# Patient Record
Sex: Female | Born: 1975 | Race: White | Hispanic: No | Smoking: Former smoker
Health system: Southern US, Community
[De-identification: ages and names within clinical notes are randomized; demographics above are authoritative.]

## PROBLEM LIST (undated history)

## (undated) DIAGNOSIS — M1990 Unspecified osteoarthritis, unspecified site: Secondary | ICD-10-CM

## (undated) DIAGNOSIS — F419 Anxiety disorder, unspecified: Secondary | ICD-10-CM

## (undated) DIAGNOSIS — F209 Schizophrenia, unspecified: Secondary | ICD-10-CM

## (undated) DIAGNOSIS — F319 Bipolar disorder, unspecified: Secondary | ICD-10-CM

## (undated) DIAGNOSIS — F1910 Other psychoactive substance abuse, uncomplicated: Secondary | ICD-10-CM

## (undated) DIAGNOSIS — K219 Gastro-esophageal reflux disease without esophagitis: Secondary | ICD-10-CM

## (undated) DIAGNOSIS — R51 Headache: Secondary | ICD-10-CM

## (undated) DIAGNOSIS — E611 Iron deficiency: Secondary | ICD-10-CM

## (undated) DIAGNOSIS — G4730 Sleep apnea, unspecified: Secondary | ICD-10-CM

## (undated) DIAGNOSIS — J342 Deviated nasal septum: Secondary | ICD-10-CM

## (undated) DIAGNOSIS — K279 Peptic ulcer, site unspecified, unspecified as acute or chronic, without hemorrhage or perforation: Secondary | ICD-10-CM

## (undated) DIAGNOSIS — E119 Type 2 diabetes mellitus without complications: Secondary | ICD-10-CM

## (undated) DIAGNOSIS — R7303 Prediabetes: Secondary | ICD-10-CM

## (undated) DIAGNOSIS — Z79899 Other long term (current) drug therapy: Secondary | ICD-10-CM

## (undated) DIAGNOSIS — D649 Anemia, unspecified: Secondary | ICD-10-CM

## (undated) HISTORY — DX: Other long term (current) drug therapy: Z79.899

## (undated) HISTORY — DX: Bipolar disorder, unspecified: F31.9

## (undated) HISTORY — DX: Deviated nasal septum: J34.2

## (undated) HISTORY — DX: Sleep apnea, unspecified: G47.30

## (undated) HISTORY — DX: Peptic ulcer, site unspecified, unspecified as acute or chronic, without hemorrhage or perforation: K27.9

## (undated) HISTORY — DX: Other psychoactive substance abuse, uncomplicated: F19.10

## (undated) HISTORY — DX: Gastro-esophageal reflux disease without esophagitis: K21.9

---

## 1998-09-14 ENCOUNTER — Other Ambulatory Visit: Admission: RE | Admit: 1998-09-14 | Discharge: 1998-09-14 | Payer: Self-pay | Admitting: Family Medicine

## 2001-10-06 HISTORY — PX: SEPTOPLASTY: SUR1290

## 2007-11-18 ENCOUNTER — Ambulatory Visit: Payer: Self-pay | Admitting: Internal Medicine

## 2007-11-18 IMAGING — CT CT HEAD WITHOUT CONTRAST
2 series · 16 of 30 positions shown, 20 images · non-contrast
Comparison: none

REASON FOR EXAM: headache dizziness x 2 days memory loss  Call report
after 5pm [phone]
COMMENTS:

[Series 2: without · axial · non-contrast · 0.39mm/px · z∈[+870,+990]mm · 13 of 29 slices shown, 17 images]
[im 3/29  brain]
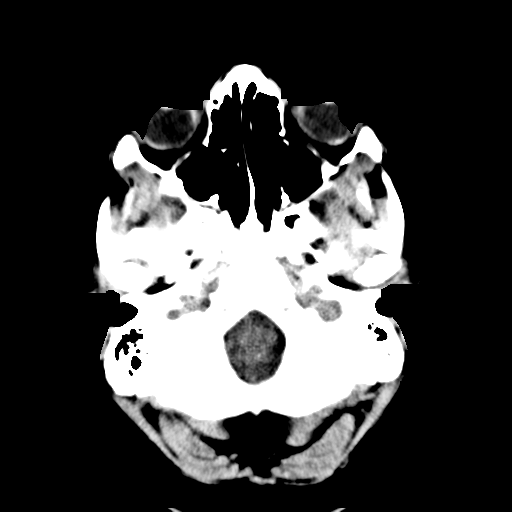
[im 3/29  bone]
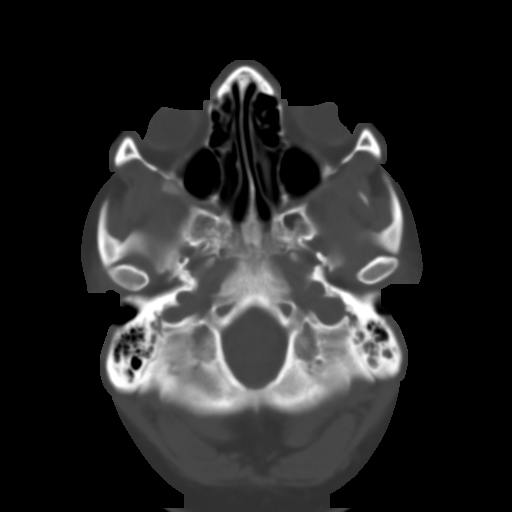
[im 5/29  brain]
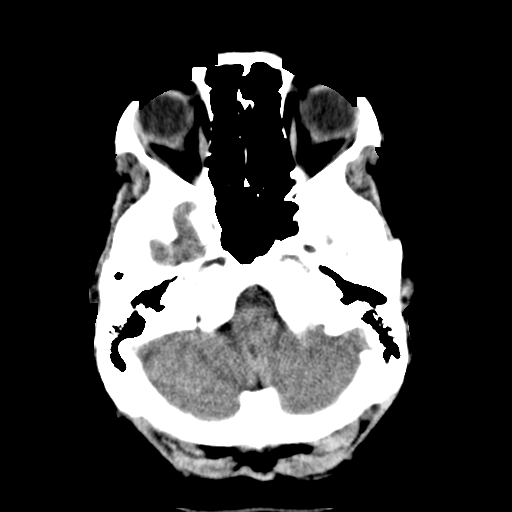
[im 7/29  brain]
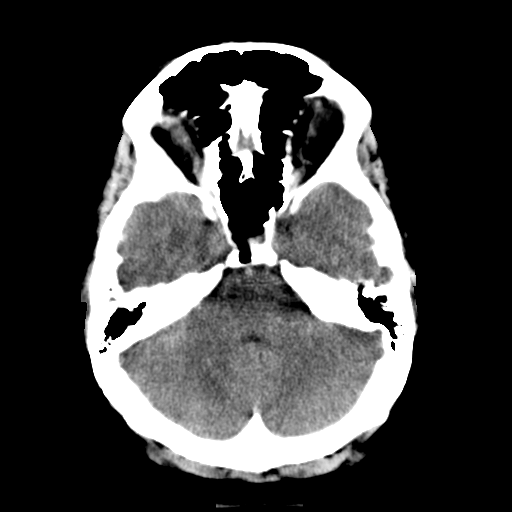
[im 9/29  brain]
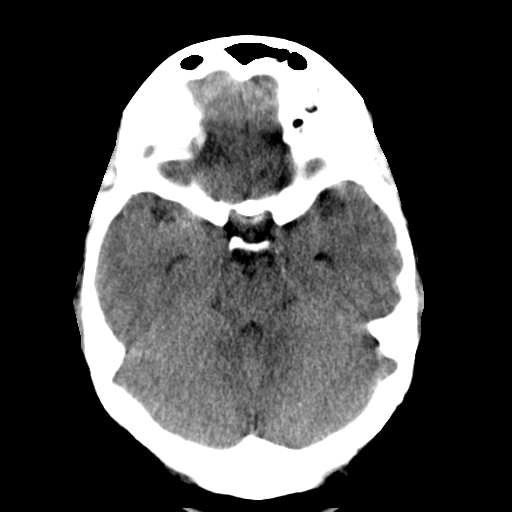
[im 11/29  brain]
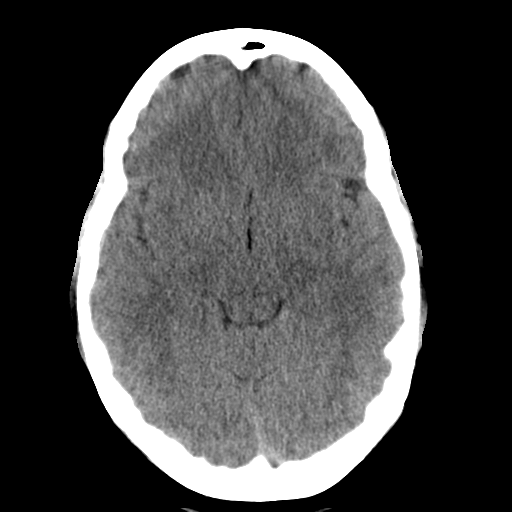
[im 11/29  bone]
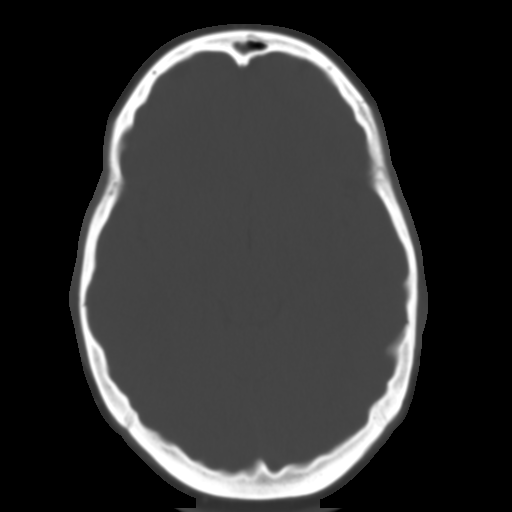
[im 13/29  brain]
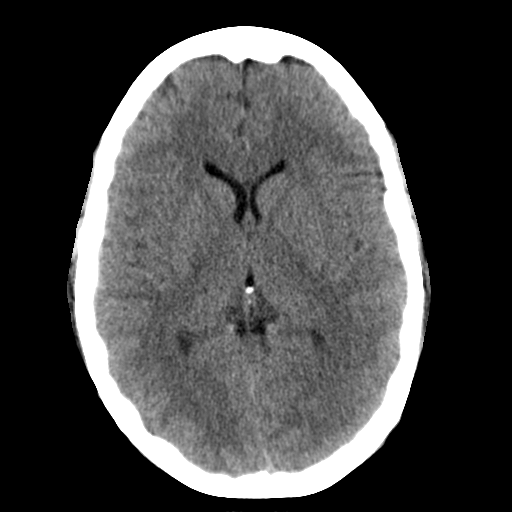
[im 15/29  brain]
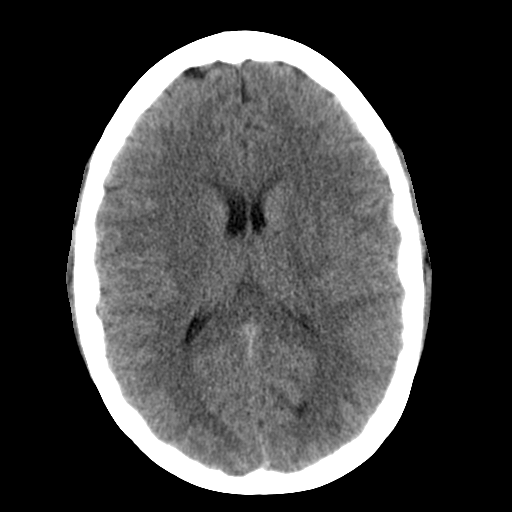
[im 17/29  brain]
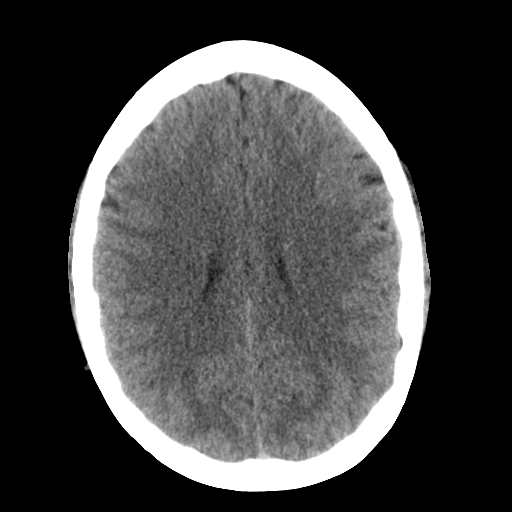
[im 19/29  brain]
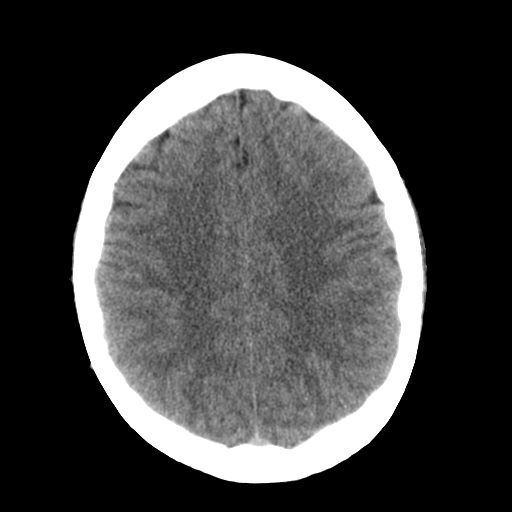
[im 19/29  bone]
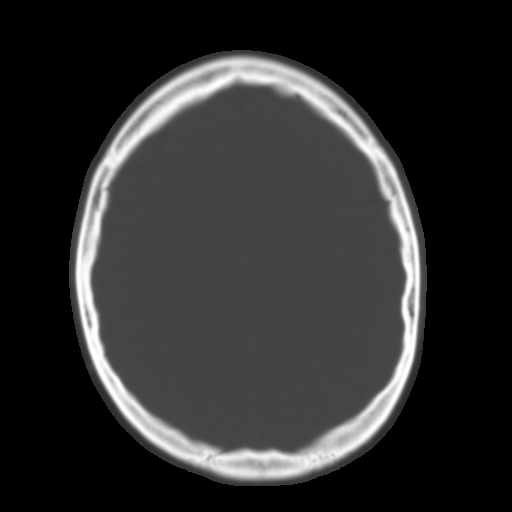
[im 21/29  brain]
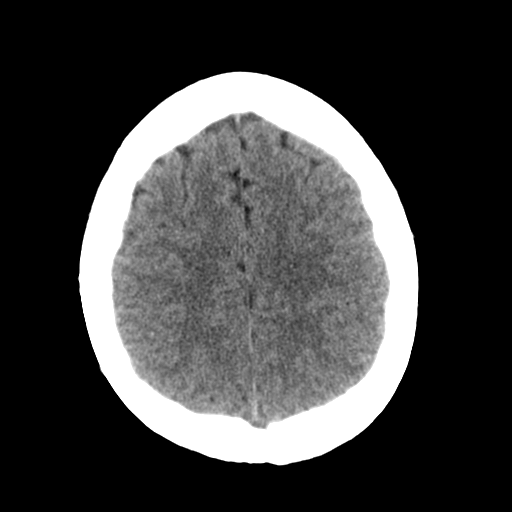
[im 23/29  brain]
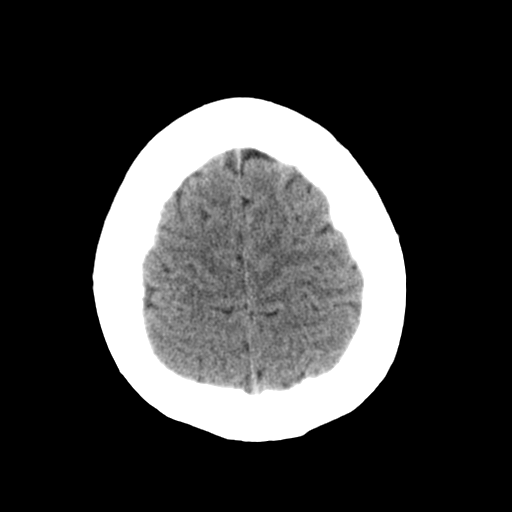
[im 25/29  brain]
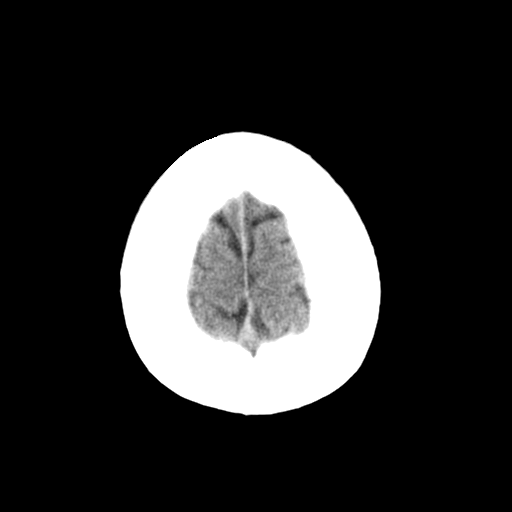
[im 27/29  brain]
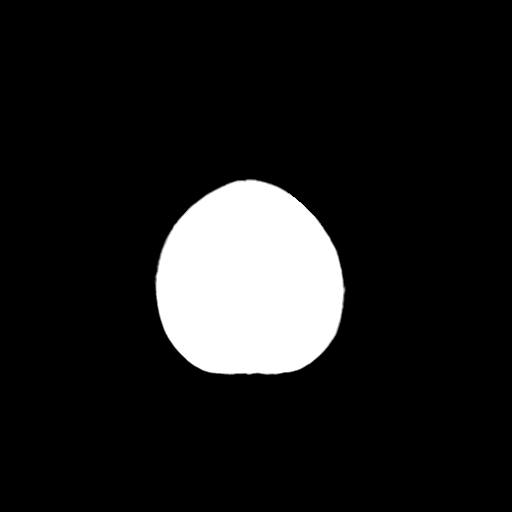
[im 27/29  bone]
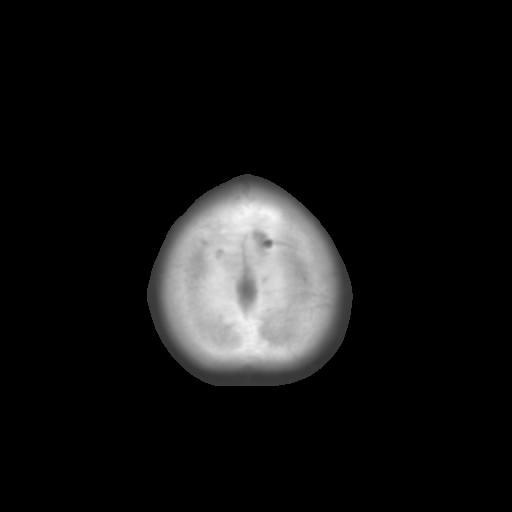

[Series 3: bone · axial · 0.39mm/px · z∈[+870,+910]mm · 3 of 29 slices shown]
[im 3/29  bone]
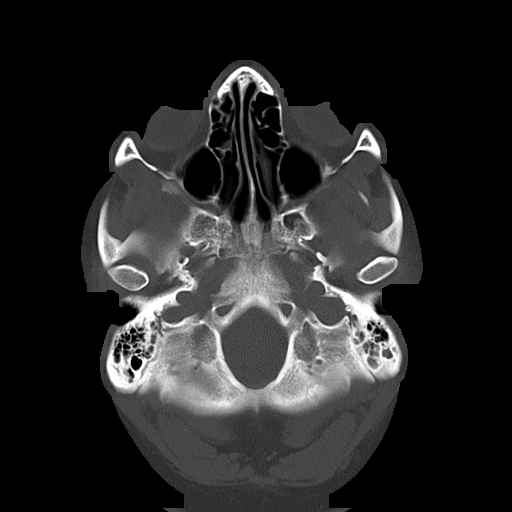
[im 7/29  bone]
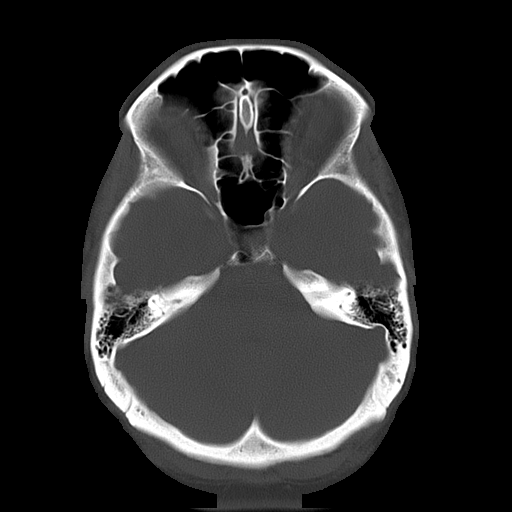
[im 11/29  bone]
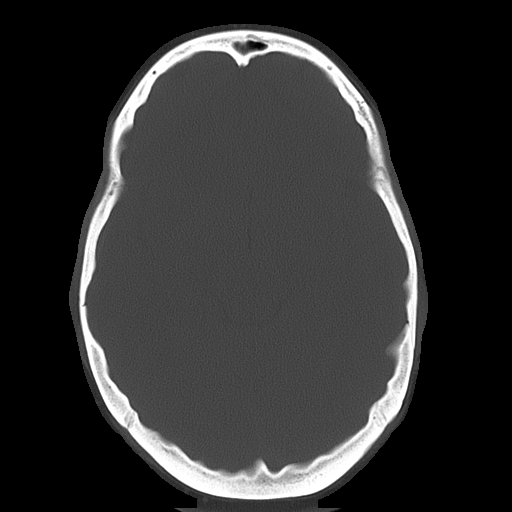

[16 of 30 positions shown; findings below may reference images not displayed]

PROCEDURE:     CT  - CT HEAD WITHOUT CONTRAST  - November 18, 2007  [DATE]

RESULT:     There is no evidence of intra-axial or extra-axial fluid
collections.  There is no evidence of acute hemorrhage or secondary signs
reflecting mass effect, subacute or chronic infarction.  The visualized bony
skeleton is evaluated and there is no evidence of depressed skull fracture
or further fracture or dislocation.  The visualized mastoid air cells are
clear.  The ventricles, cisterns and sulci are symmetric and patent.
IMPRESSION: 1)No evidence of acute intracranial abnormalities as described above.

2)If there is persistent clinical concern, further evaluation with Brain MRI
is recommended if clinically warranted.

## 2009-04-12 ENCOUNTER — Ambulatory Visit: Payer: Self-pay | Admitting: Internal Medicine

## 2009-08-08 ENCOUNTER — Emergency Department: Payer: Self-pay | Admitting: Emergency Medicine

## 2009-08-08 IMAGING — US ABDOMEN ULTRASOUND
1 series · 17 of 25 positions shown · non-contrast
Comparison: none

REASON FOR EXAM: RUQ/epigastric pain with vomiting
COMMENTS:

PROCEDURE:     US  - US ABDOMEN GENERAL SURVEY  - August 08, 2009 [DATE]
RESULT:     Comparison: None
TECHNIQUE: Multiple gray-scale and color-flow Doppler images of the abdomen
are presented for review.

[Series 1: abdomen ultrasound · 17 of 49 slices shown]
[im 1/49]
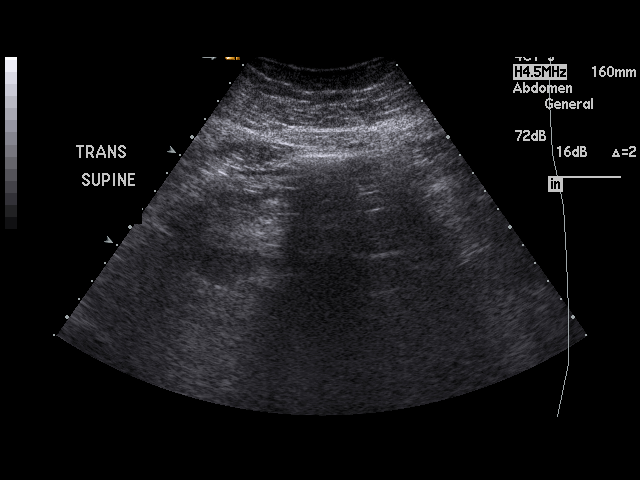
[im 5/49]
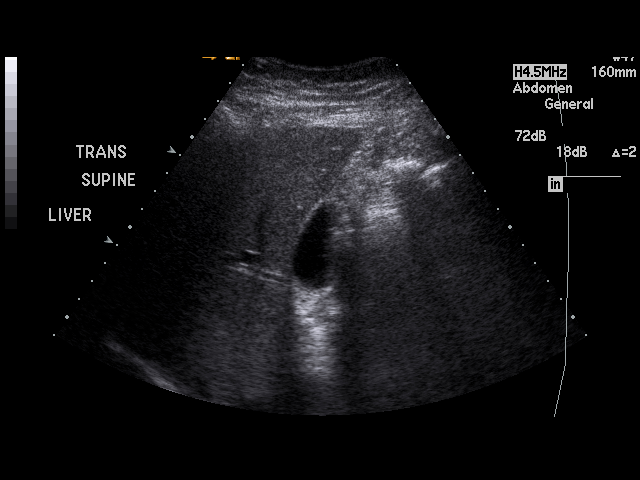
[im 7/49]
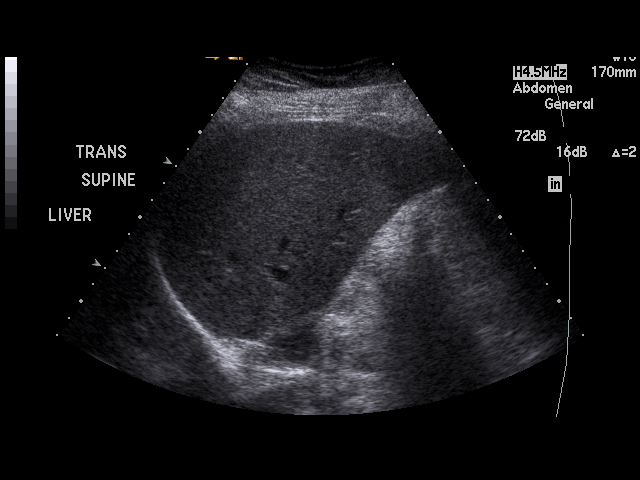
[im 11/49]
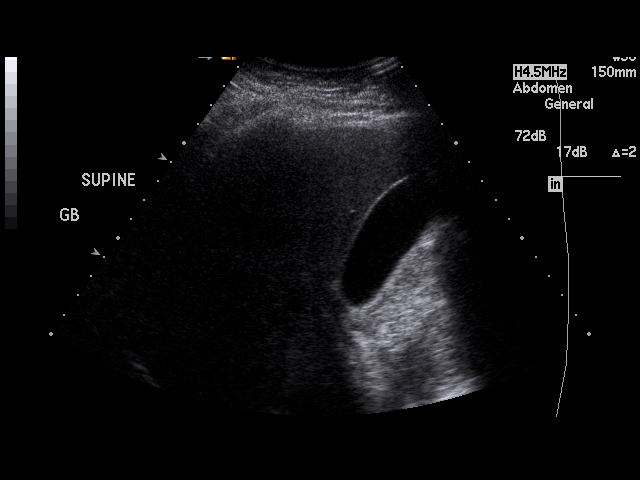
[im 13/49]
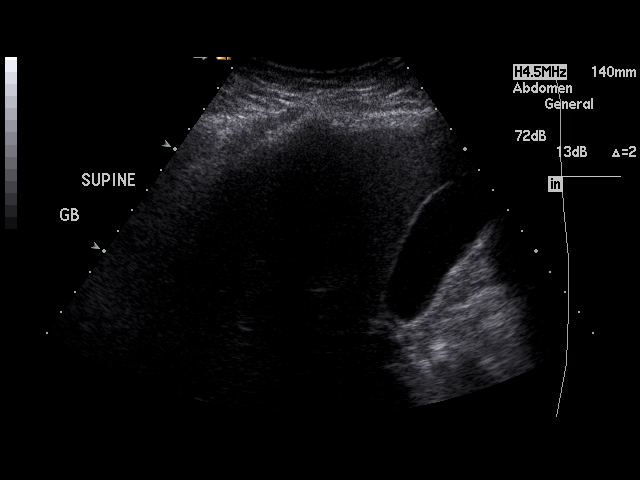
[im 17/49]
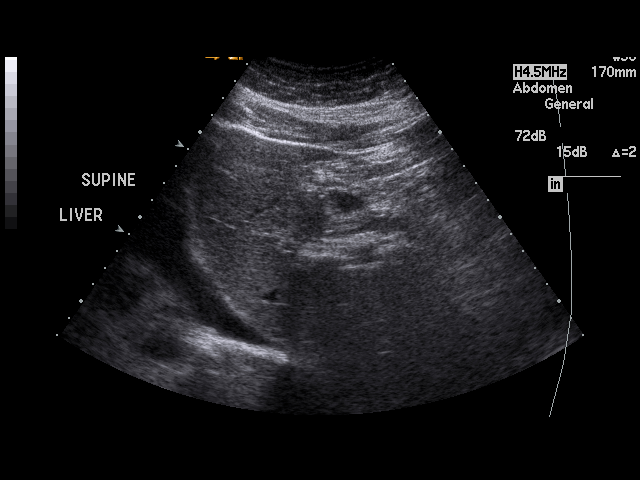
[im 19/49]
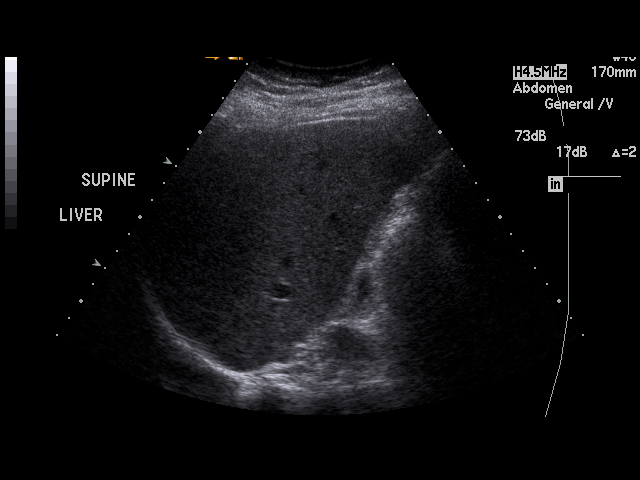
[im 23/49]
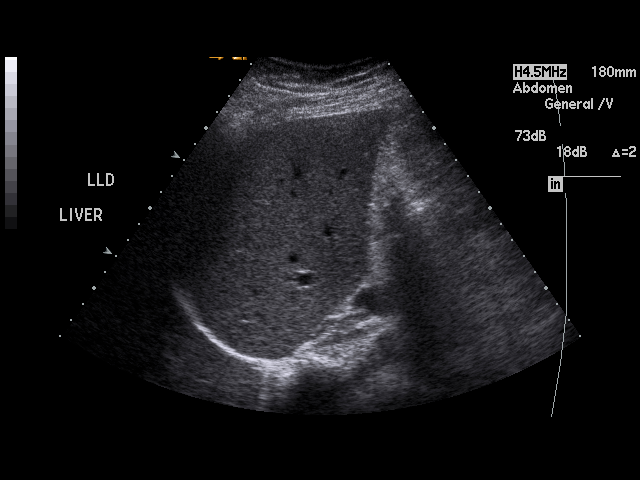
[im 25/49]
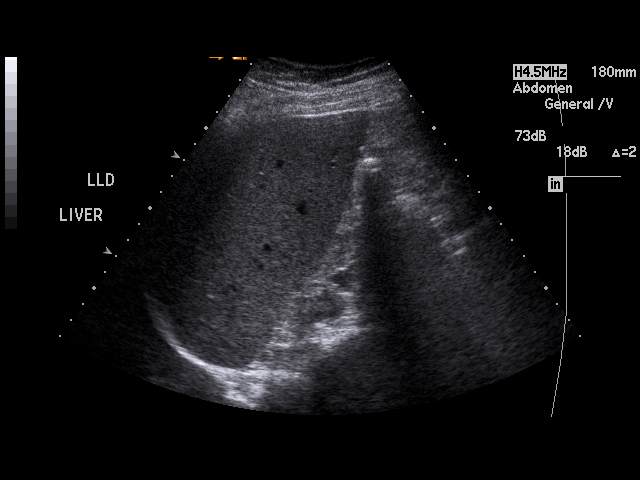
[im 27/49]
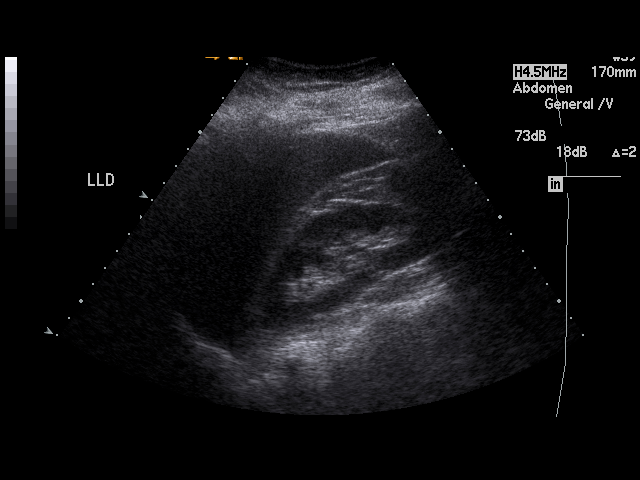
[im 31/49]
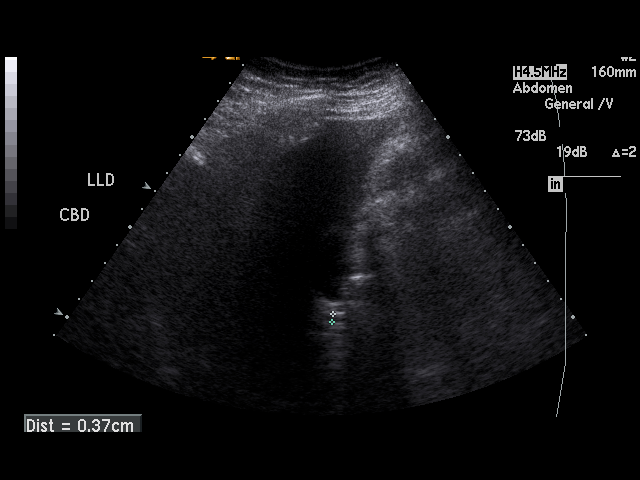
[im 33/49]
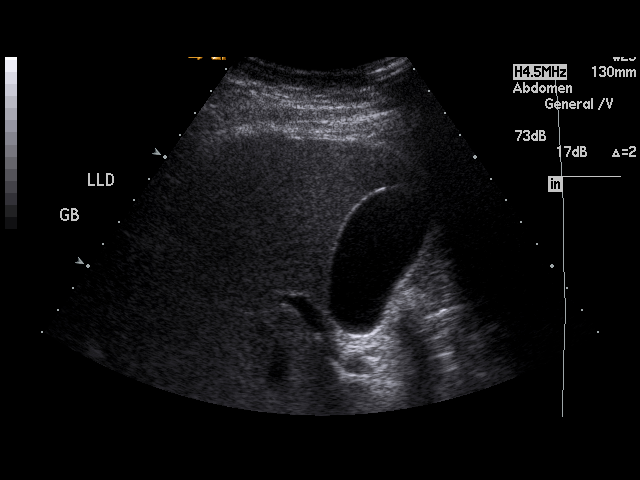
[im 37/49]
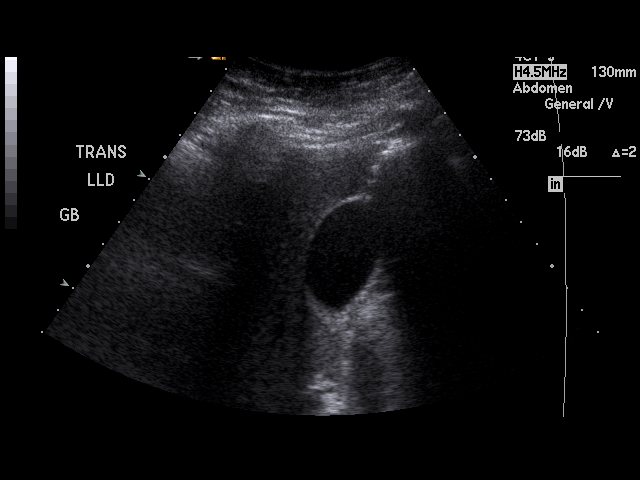
[im 39/49]
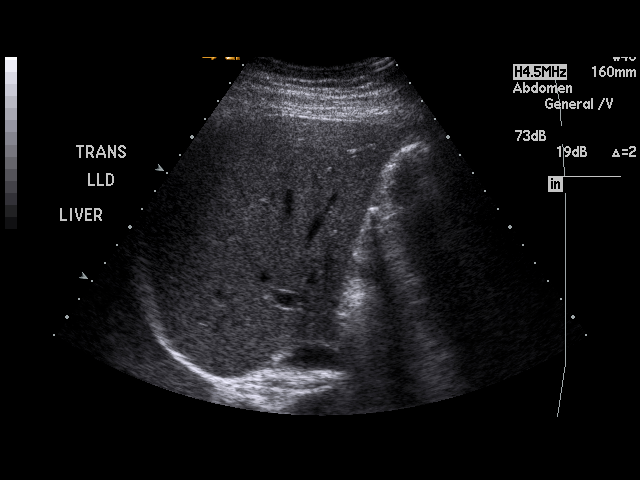
[im 43/49]
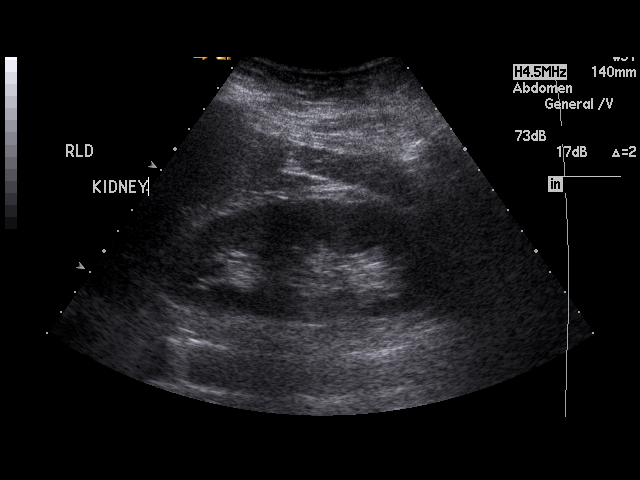
[im 45/49]
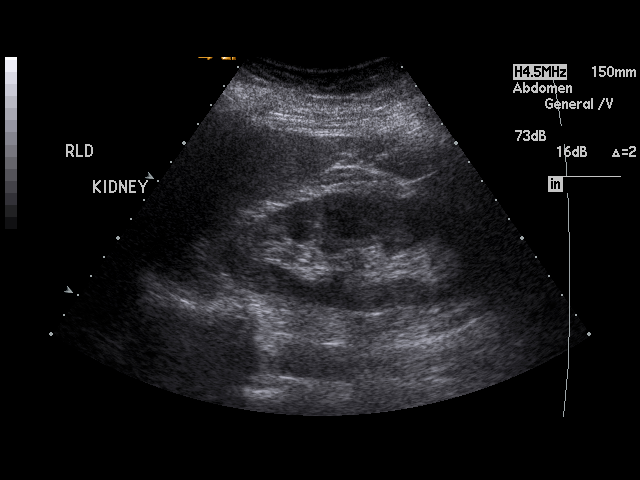
[im 49/49]
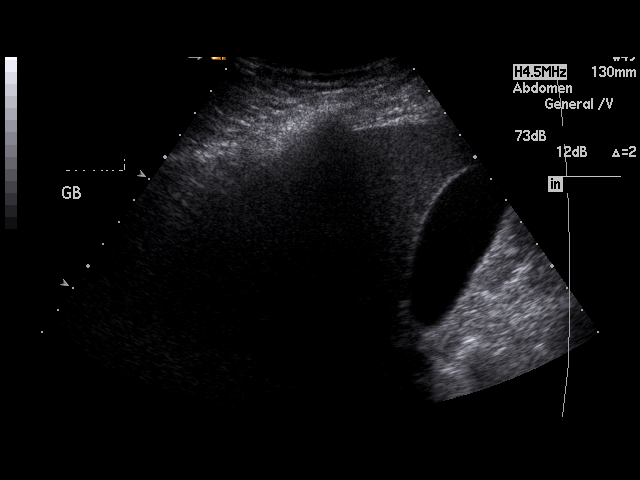

[17 of 25 positions shown; findings below may reference images not displayed]

FINDINGS: Visualized portions of the liver demonstrate normal echogenicity and normal
contours. The liver is without evidence of  focal hepatic lesion.

There is no cholelithiasis or biliary sludge. There is no intra- or
extrahepatic biliary ductal dilatation. The common duct measures 3.5 mm in
maximal diameter. There is no gallbladder wall thickening, pericholecystic
fluid, or sonographic Murphy's sign.

The pancreas is not visualized secondary to overlying bowel gas. The spleen
is unremarkable. Bilateral kidneys are normal in echogenicity and size. The
right kidney measures 10.1 cm. The left kidney measures 11.5 cm. There are
no renal calculi or hydronephrosis. The abdominal aorta and IVC are
unremarkable.
IMPRESSION: No cholelithiasis or sonographic evidence of acute cholecystitis.

## 2009-08-23 ENCOUNTER — Other Ambulatory Visit: Payer: Self-pay | Admitting: Internal Medicine

## 2009-08-28 ENCOUNTER — Ambulatory Visit: Payer: Self-pay | Admitting: Internal Medicine

## 2009-08-28 IMAGING — NM NUCLEAR MEDICINE HEPATOHBILIARY INCLUDE GB
3 series · 22 of 22 positions shown · non-contrast
Comparison: none

REASON FOR EXAM: RUQ abd pain
COMMENTS:

[Series 1000: gallbladder statics · 4.80mm/px · 10 of 10 slices shown]
[im 1/10]
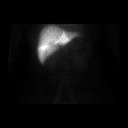
[im 2/10]
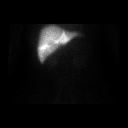
[im 3/10]
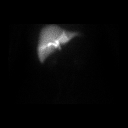
[im 4/10]
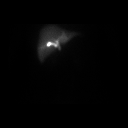
[im 5/10]
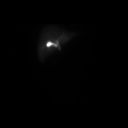
[im 6/10]
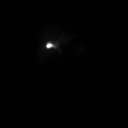
[im 7/10]
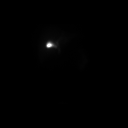
[im 8/10]
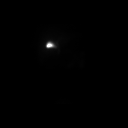
[im 9/10]
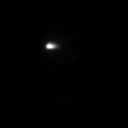
[im 10/10]
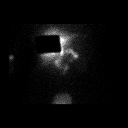

[Series 1000: gallbladder dynamic (results) · 4.80mm/px · 6 of 60 frames shown]
[frame 6/60]
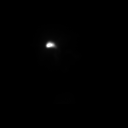
[frame 16/60]
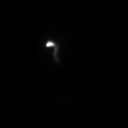
[frame 26/60]
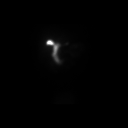
[frame 36/60]
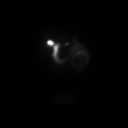
[frame 46/60]
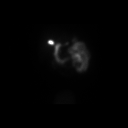
[frame 56/60]
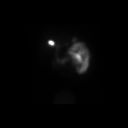

[Series 1000: gallbladder dynamic · 4.80mm/px · 6 of 60 frames shown]
[frame 6/60]
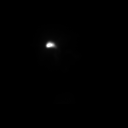
[frame 16/60]
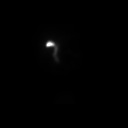
[frame 26/60]
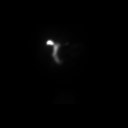
[frame 36/60]
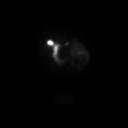
[frame 46/60]
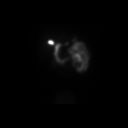
[frame 56/60]
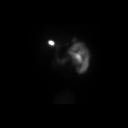

[22 of 22 positions shown; findings below may reference images not displayed]

PROCEDURE:     NM  - NM HEPATO WITH GB EJECT FRACTION  - August 28, 2009  [DATE]

RESULT:     Following intravenous administration of 8.22 mCi Technetium 99m
Choletec, there is noted prompt visualization of tracer activity in the
liver at 3 minutes. At 40 minutes, tracer activity is visualized in the
gallbladder, common duct and proximal small bowel.

Gallbladder ejection fraction measures 85% at 30 minutes. This is in the
normal range.
IMPRESSION: 1.  Normal Hepatobiliary Scan.
2.  The gallbladder ejection fraction measures 85% which is in the normal
range.

## 2009-12-18 ENCOUNTER — Ambulatory Visit: Payer: Self-pay | Admitting: Internal Medicine

## 2009-12-18 IMAGING — RF DG UGI W/O KUB
1 series · 15 of 24 positions shown · non-contrast
Comparison: None

REASON FOR EXAM: heartburn
COMMENTS:

PROCEDURE:     FL  - FL UPPER GI  - December 18, 2009  [DATE]
RESULT:     Indication: Heartburn

[Series 1: run · 20 acquisitions, 15 frames shown]
[im 1/20]
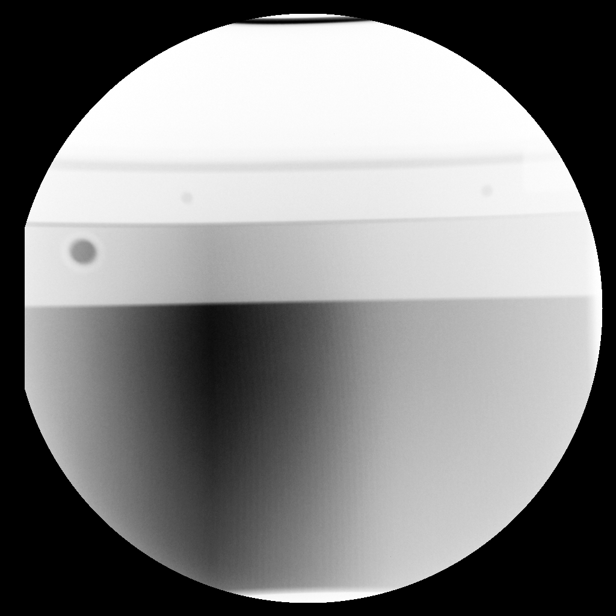
[im 2/20]
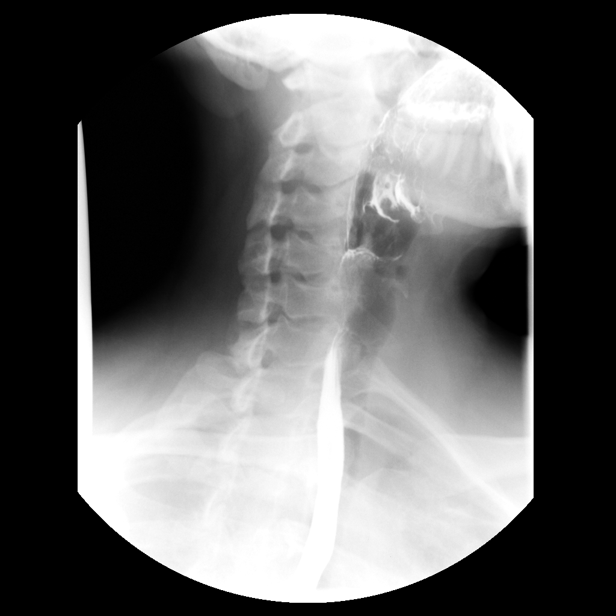
[im 3/20]
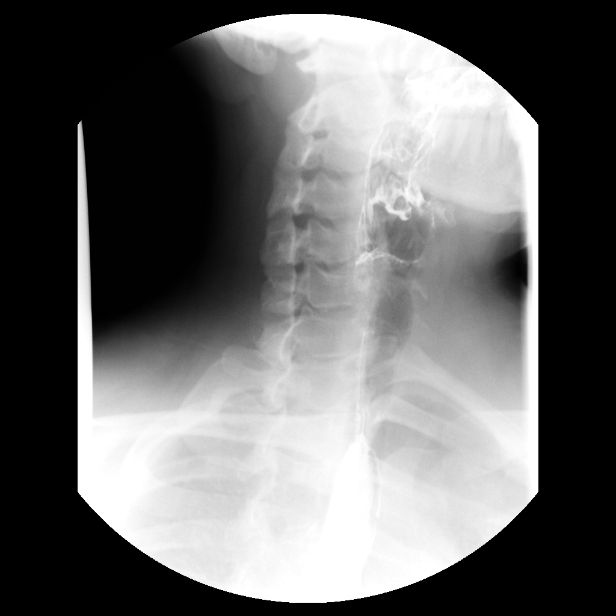
[im 3/20]
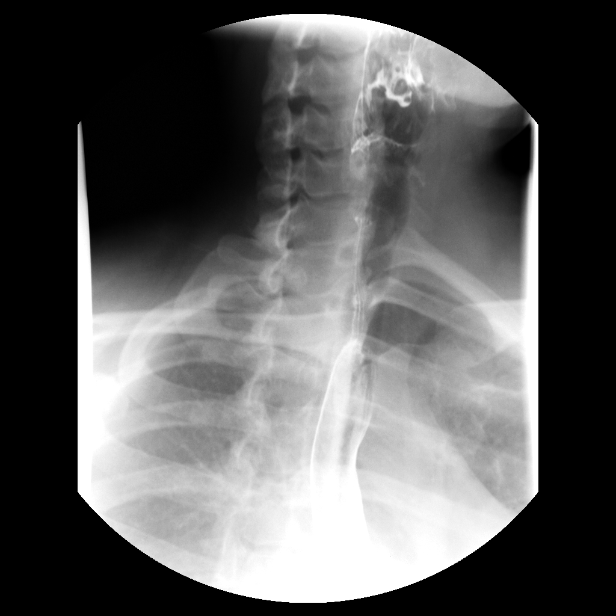
[im 4/20]
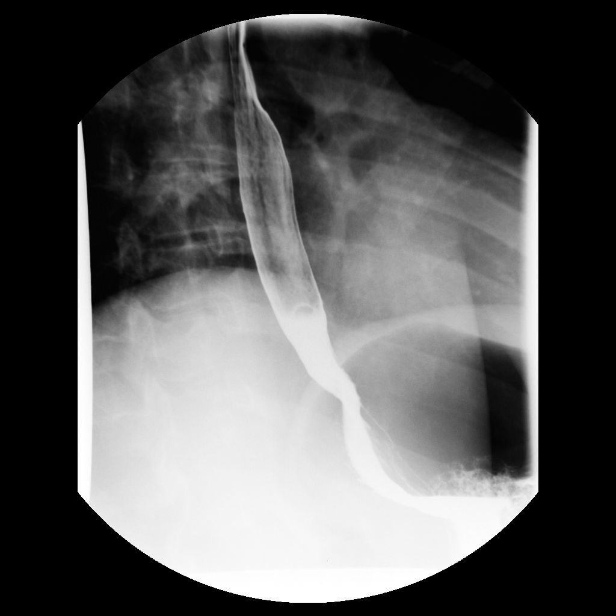
[im 5/20]
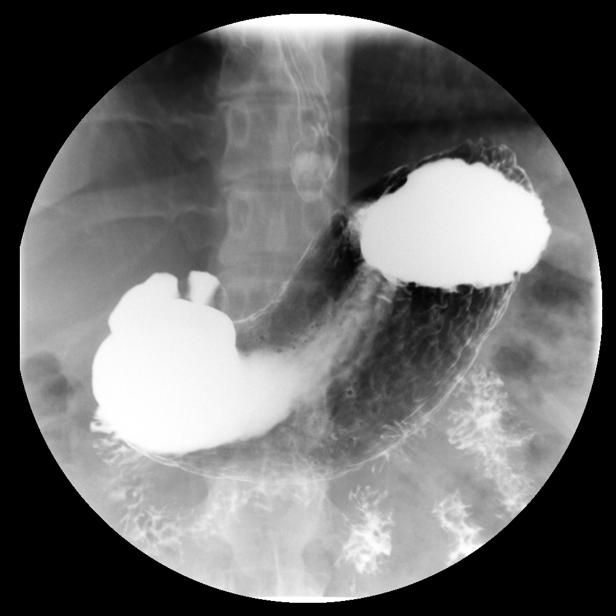
[im 7/20]
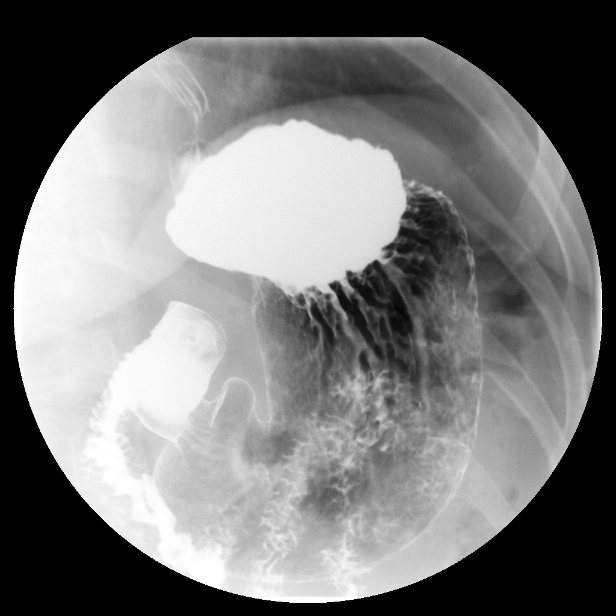
[im 10/20]
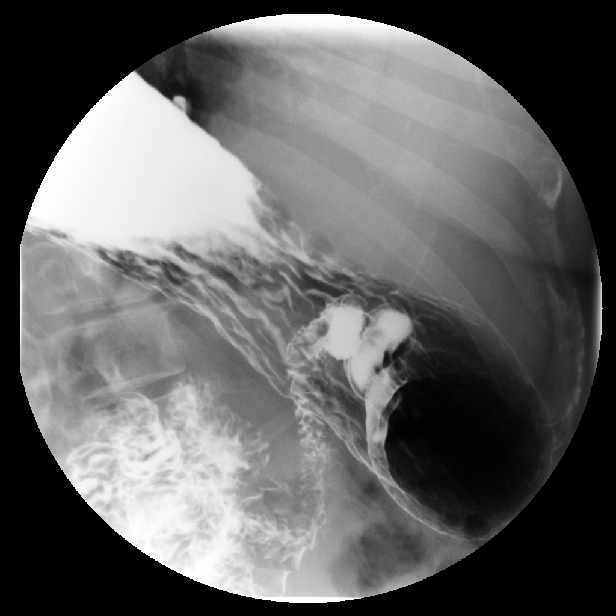
[im 11/20]
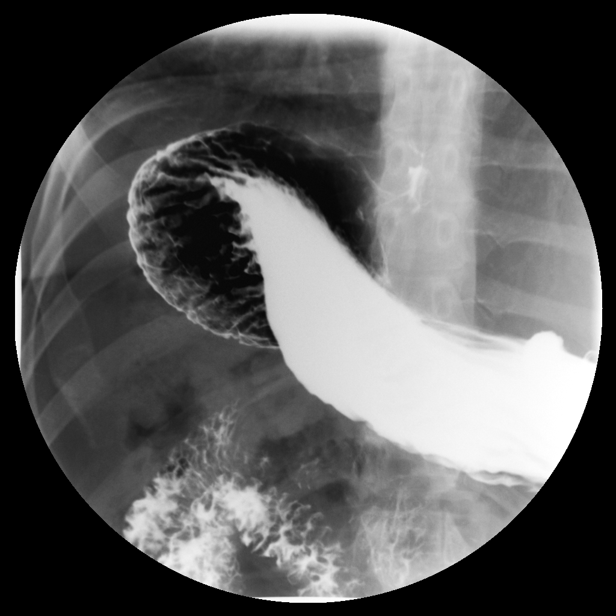
[im 13/20]
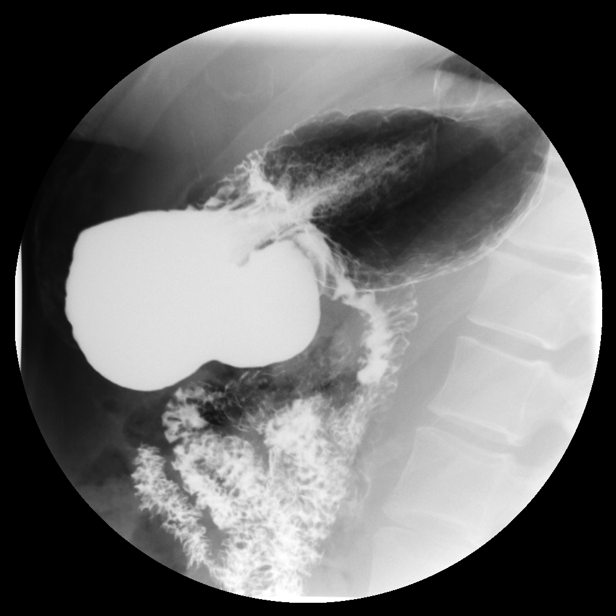
[im 14/20]
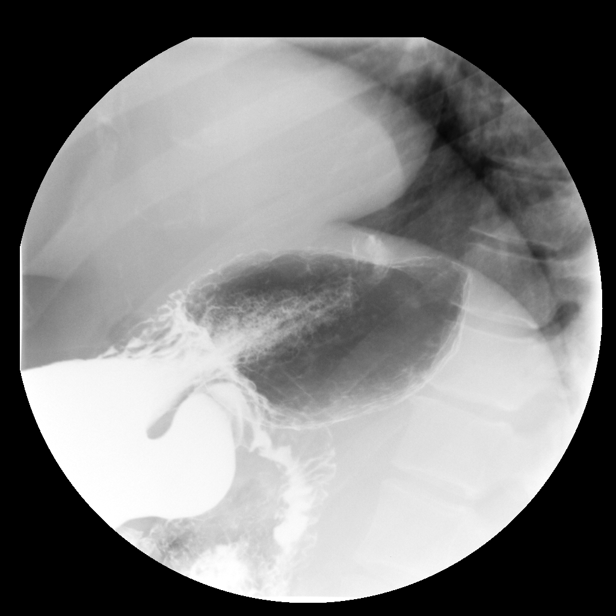
[im 17/20]
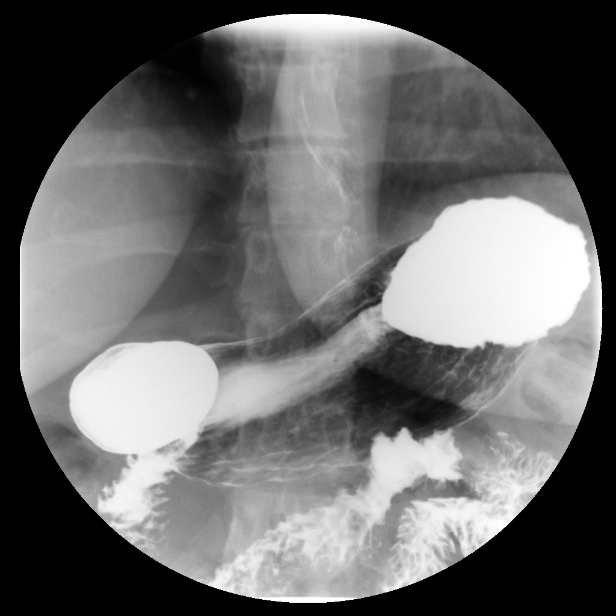
[im 18/20]
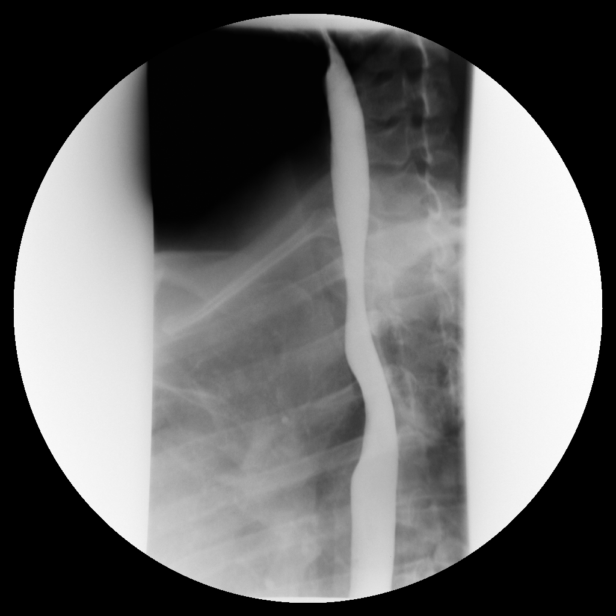
[im 18/20]
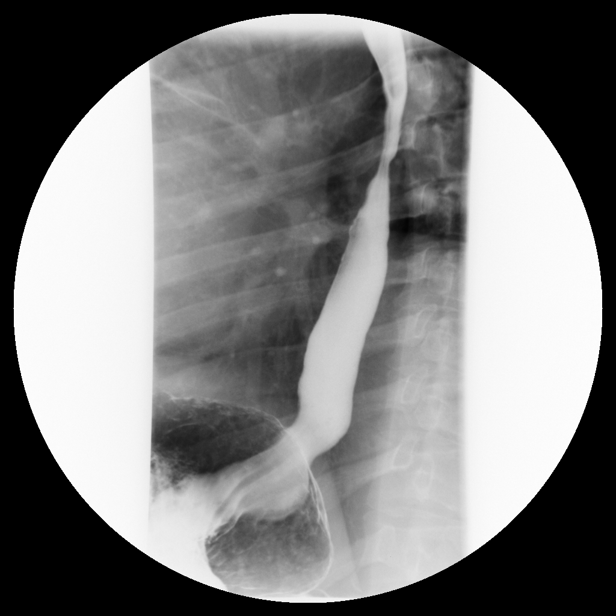
[im 20/20]
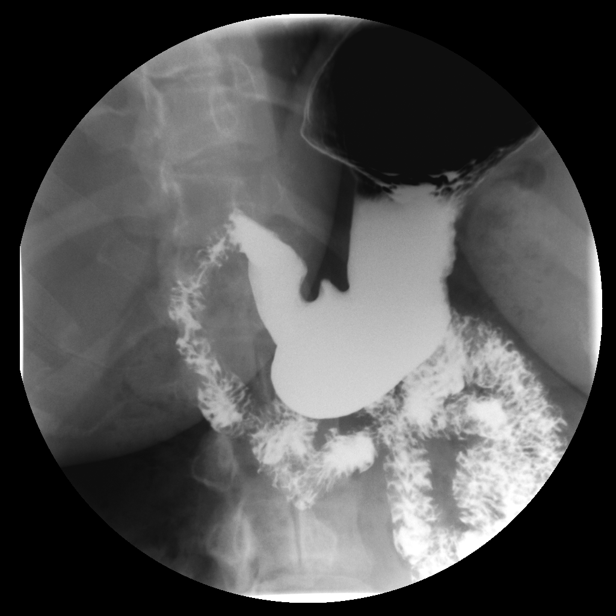

[15 of 24 positions shown; findings below may reference images not displayed]

FINDINGS: Biphasic examination of the esophagus to the distal duodenum was performed
without complication. Total fluoroscopy time was 1.6 minutes.

Examination of the esophagus demonstrated normal esophageal motility. Normal
esophageal morphology without evidence of esophagitis or ulceration. No
esophageal stricture, diverticula, or mass lesion. No evidence of hiatal
hernia. There is no spontaneous or inducible gastroesophageal reflux.

Examination of the stomach demonstrated normal rugal folds and areae
gastricae. The gastric mucosa appeared unremarkable without evidence of
ulceration, scarring, or mass lesion. Gastric motility and emptying was
normal. Fluoroscopic examination of the duodenum demonstrates normal
motility and morphology without evidence of ulceration or mass lesion.
IMPRESSION: 1. Normal Upper GI.

## 2010-02-20 ENCOUNTER — Other Ambulatory Visit: Admission: RE | Admit: 2010-02-20 | Discharge: 2010-02-20 | Payer: Self-pay | Admitting: *Deleted

## 2011-07-17 ENCOUNTER — Other Ambulatory Visit (INDEPENDENT_AMBULATORY_CARE_PROVIDER_SITE_OTHER): Payer: Self-pay | Admitting: General Surgery

## 2011-07-17 ENCOUNTER — Ambulatory Visit (INDEPENDENT_AMBULATORY_CARE_PROVIDER_SITE_OTHER): Payer: Managed Care, Other (non HMO) | Admitting: General Surgery

## 2011-07-17 ENCOUNTER — Encounter (INDEPENDENT_AMBULATORY_CARE_PROVIDER_SITE_OTHER): Payer: Self-pay | Admitting: General Surgery

## 2011-07-17 LAB — HEMOGLOBIN A1C: Hgb A1c MFr Bld: 5.8 % — ABNORMAL HIGH (ref <5.7)

## 2011-07-17 LAB — CBC
HCT: 40 % (ref 36.0–46.0)
Hemoglobin: 13.2 g/dL (ref 12.0–15.0)
MCH: 30.6 pg (ref 26.0–34.0)
MCHC: 33 g/dL (ref 30.0–36.0)
RBC: 4.32 MIL/uL (ref 3.87–5.11)

## 2011-07-17 LAB — COMPREHENSIVE METABOLIC PANEL
Albumin: 3.9 g/dL (ref 3.5–5.2)
Alkaline Phosphatase: 50 U/L (ref 39–117)
BUN: 8 mg/dL (ref 6–23)
CO2: 25 mEq/L (ref 19–32)
Glucose, Bld: 81 mg/dL (ref 70–99)
Potassium: 4.6 mEq/L (ref 3.5–5.3)
Total Protein: 7 g/dL (ref 6.0–8.3)

## 2011-07-17 LAB — T4: T4, Total: 13.7 ug/dL — ABNORMAL HIGH (ref 5.0–12.5)

## 2011-07-17 LAB — TSH: TSH: 2.301 u[IU]/mL (ref 0.350–4.500)

## 2011-07-17 LAB — LIPID PANEL
Cholesterol: 188 mg/dL (ref 0–200)
HDL: 71 mg/dL (ref >39)
Total CHOL/HDL Ratio: 2.6 Ratio
VLDL: 34 mg/dL (ref 0–40)

## 2011-07-17 NOTE — Progress Notes (Signed)
Subjective:   Morbid obesity  Patient ID: Belinda [name], female   DOB: 04-12-1976, 35 y.o.   MRN: [id number]  HPI The patient is a 35 year old female here for consideration for surgical treatment for morbid obesity. She states that until her early 58s she was relatively normal weight but since then has had difficulty with progressive weight gain. Over the years she has been through multiple efforts at losing the weight not surgically including medications and supervised diet plans. She has had minimal success and has seen her weight gradually creeped up toward. She is very concerned about her long-term health she now has developed diabetes diet controlled. She has a sister who has had successful gastric bypass and improved health. She has researched her options and has been for initial information seminar and is interested in Roux-en-Y gastric bypass.  Past Medical History  Diagnosis Date  . GERD (gastroesophageal reflux disease)   . Substance abuse   . Diabetes mellitus     type 2  . Bipolar affective disorder   . Sleep apnea    Past Surgical History  Procedure Date  . Septoplasty 2003   Current Outpatient Prescriptions  Medication Sig Dispense Refill  . BuPROPion HCl (WELLBUTRIN XL PO) Take 450 mg by mouth daily.        Marland Kitchen dexlansoprazole (DEXILANT) 60 MG capsule Take 60 mg by mouth daily.        . Divalproex Sodium (DEPAKOTE PO) Take by mouth every other day. Patient takes 200 mg one dosage and 300 mg for the next.       . lamoTRIgine (LAMICTAL) 100 MG tablet Take 100 mg by mouth daily.        Suzzanne Cloud Estradiol (PREVIFEM PO) Take by mouth daily.        . sucralfate (CARAFATE) 1 G tablet Take 1 g by mouth 3 (three) times daily before meals.         Allergies  Allergen Reactions  . Acetaminophen Nausea Only   History  Substance Use Topics  . Smoking status: Never Smoker   . Smokeless tobacco: Never Used  . Alcohol Use: No    Review of Systems  Constitutional:  Positive for fatigue.  HENT: Negative.   Eyes: Negative.   Respiratory: Negative.   Cardiovascular: Negative.   Gastrointestinal: Positive for abdominal pain. Negative for nausea, vomiting, diarrhea, constipation, blood in stool, abdominal distention and rectal pain.  Genitourinary: Negative.   Musculoskeletal: Negative.   Psychiatric/Behavioral: Positive for dysphoric mood.       Objective:   Physical Exam General: Obese Caucasian female in no acute distress Skin: Warm and dry without rash or infection HEENT: No palpable masses thyromegaly. Oropharynx clear. Sclera nonicteric. Pupils equal round reactive. Well-healed scar over her right cheek. Lymph nodes: No cervical, supraclavicular, or axillary nodes palpable Breasts: Not examined Lungs: Clear without wheezing or increased work of breathing Cardiovascular: Regular rate and rhythm without murmur. No JVD or edema. Peripheral pulses intact. Abdomen: Soft and nontender without masses or organomegaly. No hernias. Extremities: No joint swelling, deformity, edema or venous stasis changes Neuro: Alert and fully oriented. Gait normal.    [facility] showing that the patient has had a normal upper endoscopy in 2011 and gastric biopsy showed no evidence of H. pylori.  Assessment:     35 year old female with progressive morbid obesity unresponsive to medical management who presents with comorbidities of obstructive sleep apnea and diet-controlled diabetes. I think she has very significant potential  medical benefit from surgical weight loss. We discussed surgical options available including lap band, sleeve gastrectomy, and Roux-en-Y gastric bypass. For a number of reasons she prefers gastric bypass and that this would be a good choice for her. We discussed the procedure including expected results and early complications of anesthetic complications, bleeding, infection, leakage, pulmonary embolus, and small risk of death.  We discussed long-term complications of nutritional deficiencies, gallstones, bowel obstruction, stenosis, ulcer, and potential with psychiatric side effects. She was given a complete consent form to review.    Plan:     We will obtain clearance from her psychologist in proceed with lab work, nutritional evaluation, and bladder ultrasound, and bone density, Lantus for laparoscopic Roux-en-Y gastric bypass.

## 2011-07-17 NOTE — Patient Instructions (Signed)
Call for any questions about your preoperative workup

## 2011-07-23 ENCOUNTER — Encounter: Payer: Self-pay | Admitting: *Deleted

## 2011-07-23 ENCOUNTER — Encounter: Payer: Managed Care, Other (non HMO) | Attending: General Surgery | Admitting: *Deleted

## 2011-07-23 DIAGNOSIS — Z01818 Encounter for other preprocedural examination: Secondary | ICD-10-CM | POA: Insufficient documentation

## 2011-07-23 DIAGNOSIS — Z713 Dietary counseling and surveillance: Secondary | ICD-10-CM | POA: Insufficient documentation

## 2011-07-23 NOTE — Progress Notes (Signed)
  Pre-Op Assessment Visit: Pre-Operative Gastric Bypass Surgery  Medical Nutrition Therapy:  Appt start time: 1012 end time:  1048.  Patient was seen on 07/23/2011 for Pre-Operative Gastric Bypass Nutrition Assessment. Assessment and letter of approval faxed to [facility] Bariatric Surgery Program coordinator on 07/23/2011.    Handouts given during visit include:  Pre-Op Goals Handout  Patient to call for Pre-Op and Post-Op Nutrition Education at the [facility] when surgery is scheduled.

## 2011-07-23 NOTE — Patient Instructions (Signed)
   Follow Pre-Op Nutrition Goals to prepare for Gastric Bypass Surgery.   Call the [facility] at [phone] once you have been given your surgery date to enrolled in the Pre-Op Nutrition Class. You will need to attend this nutrition class 3-4 weeks prior to your surgery. 

## 2011-07-28 ENCOUNTER — Ambulatory Visit (HOSPITAL_COMMUNITY)
Admission: RE | Admit: 2011-07-28 | Discharge: 2011-07-28 | Disposition: A | Discharge status: Home / Self Care | Payer: Managed Care, Other (non HMO) | Source: Ambulatory Visit | Attending: General Surgery | Admitting: General Surgery

## 2011-07-28 ENCOUNTER — Other Ambulatory Visit: Payer: Self-pay

## 2011-07-28 DIAGNOSIS — G4730 Sleep apnea, unspecified: Secondary | ICD-10-CM | POA: Insufficient documentation

## 2011-07-28 DIAGNOSIS — E119 Type 2 diabetes mellitus without complications: Secondary | ICD-10-CM | POA: Insufficient documentation

## 2011-07-28 DIAGNOSIS — Z6841 Body Mass Index (BMI) 40.0 and over, adult: Secondary | ICD-10-CM | POA: Insufficient documentation

## 2011-07-28 DIAGNOSIS — Z13820 Encounter for screening for osteoporosis: Secondary | ICD-10-CM | POA: Insufficient documentation

## 2011-07-28 DIAGNOSIS — K219 Gastro-esophageal reflux disease without esophagitis: Secondary | ICD-10-CM | POA: Insufficient documentation

## 2011-07-28 IMAGING — US US ABDOMEN COMPLETE
1 series · 14 of 25 positions shown · non-contrast
Comparison: None.

CLINICAL DATA: Morbid obesity.  Pre-op evaluation for bariatric
surgery.

ABDOMINAL ULTRASOUND COMPLETE

[Series 1: us abdomen complete · 14 of 69 slices shown]
[im 1/69]
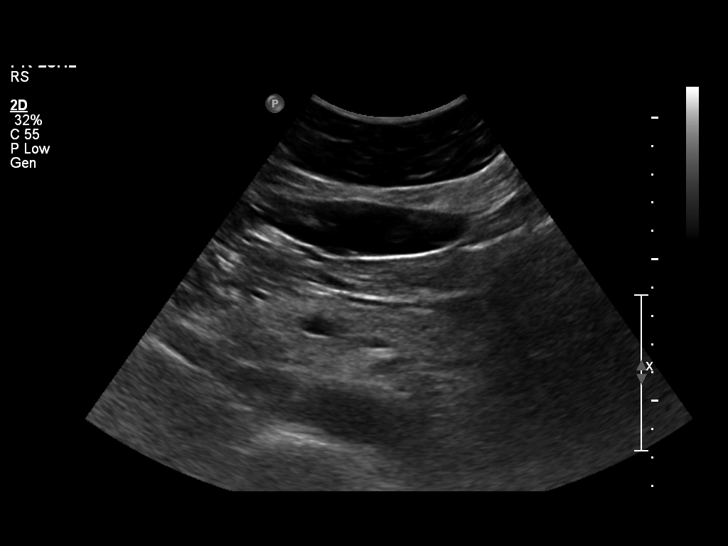
[im 6/69]
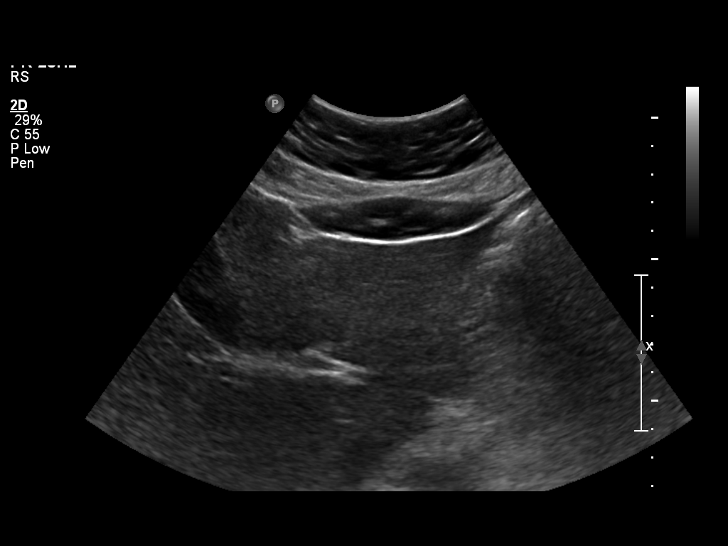
[im 12/69]
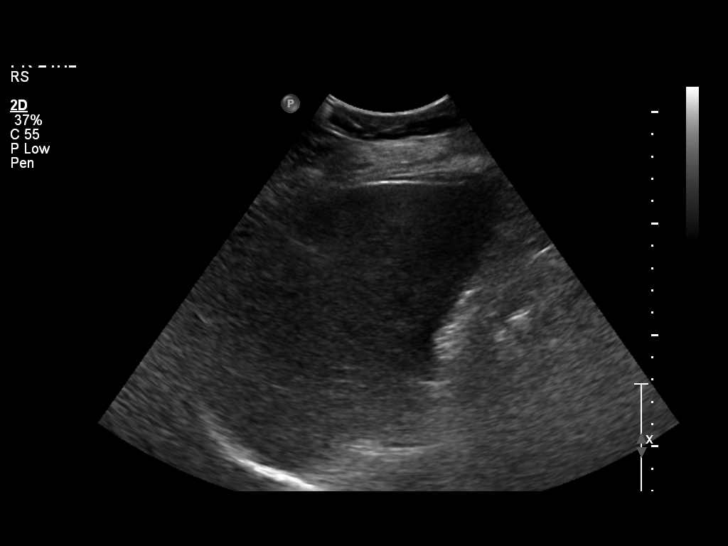
[im 18/69]
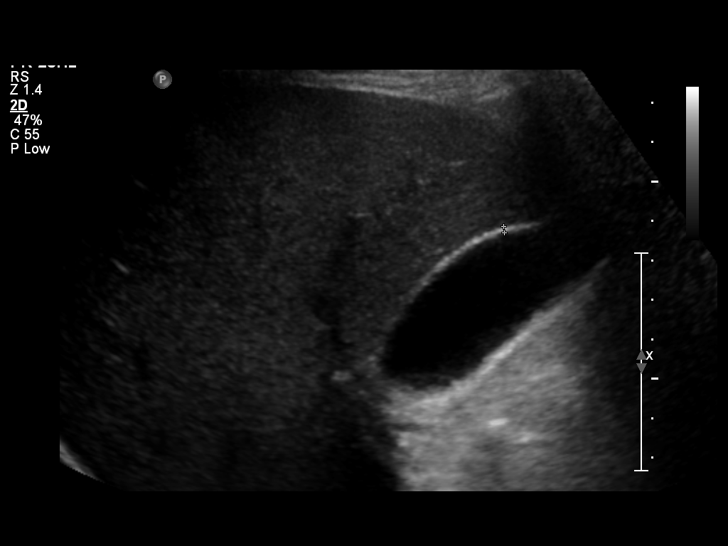
[im 23/69]
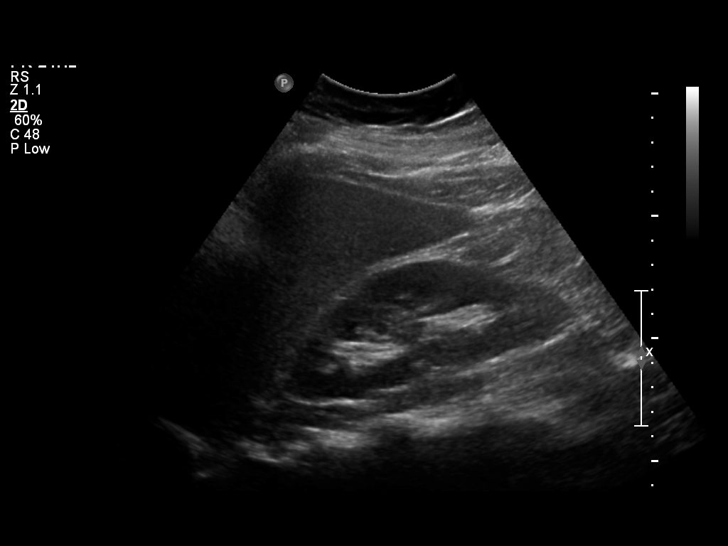
[im 26/69]
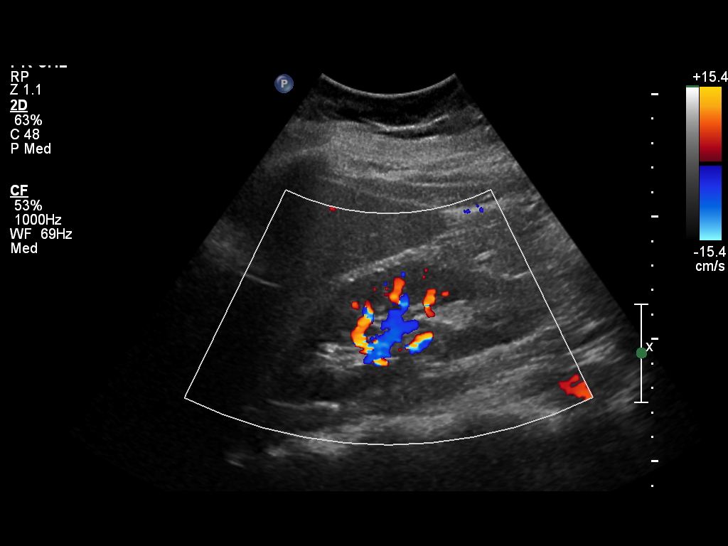
[im 32/69]
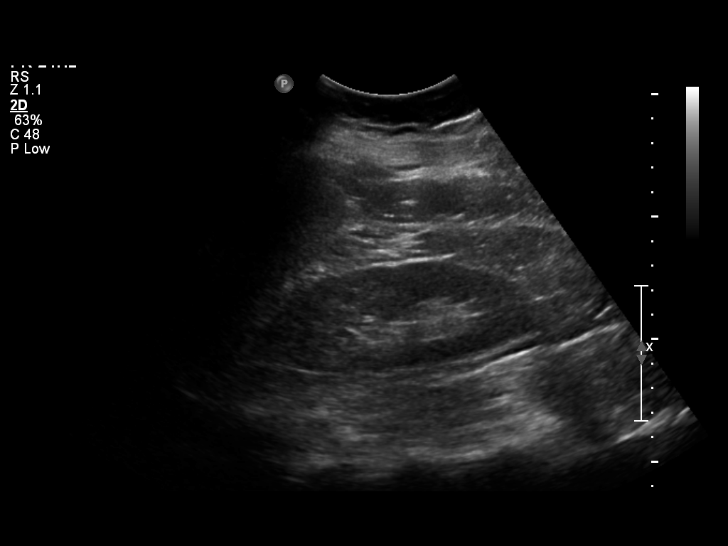
[im 37/69]
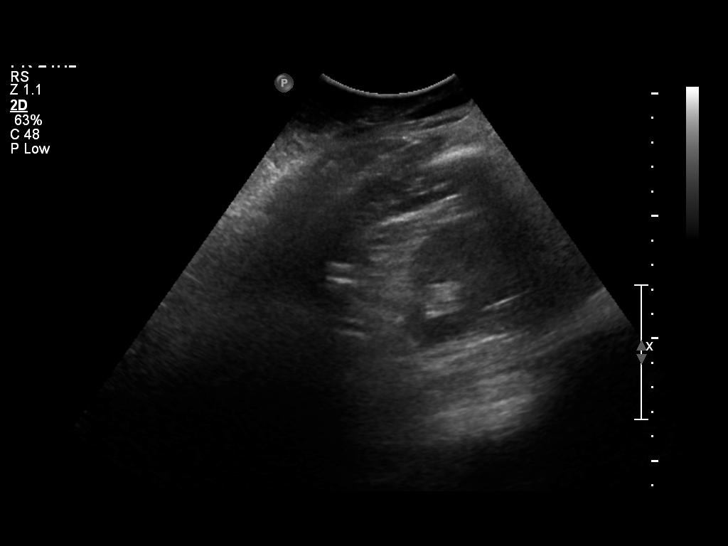
[im 43/69]
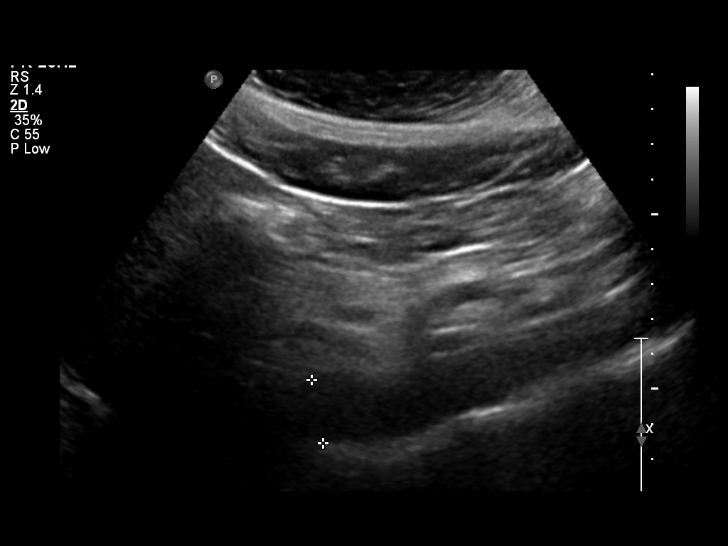
[im 46/69]
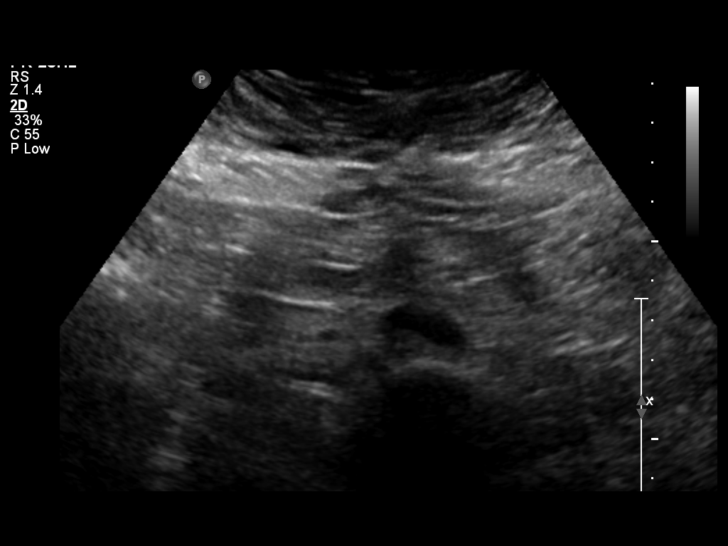
[im 52/69]
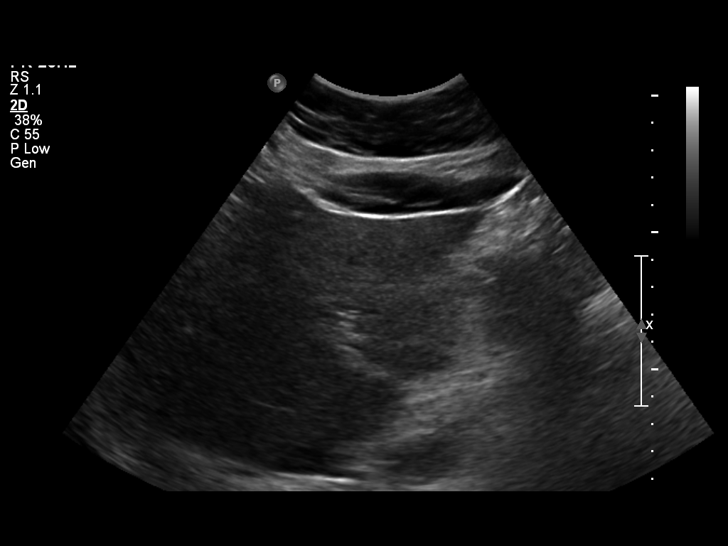
[im 57/69]
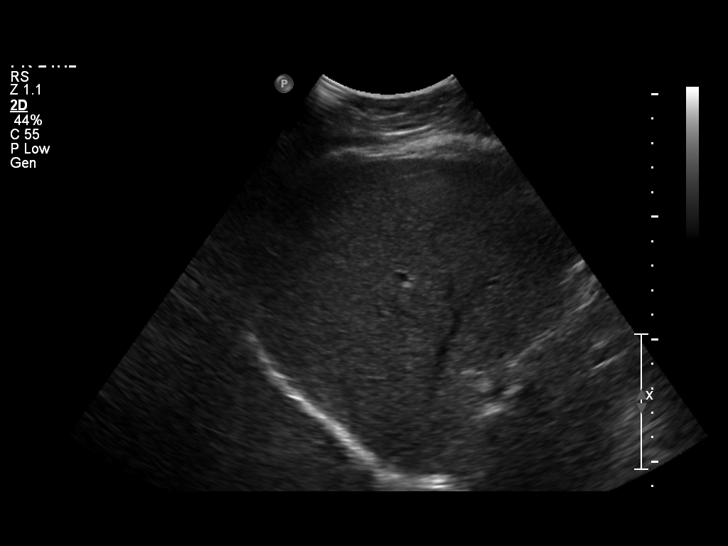
[im 63/69]
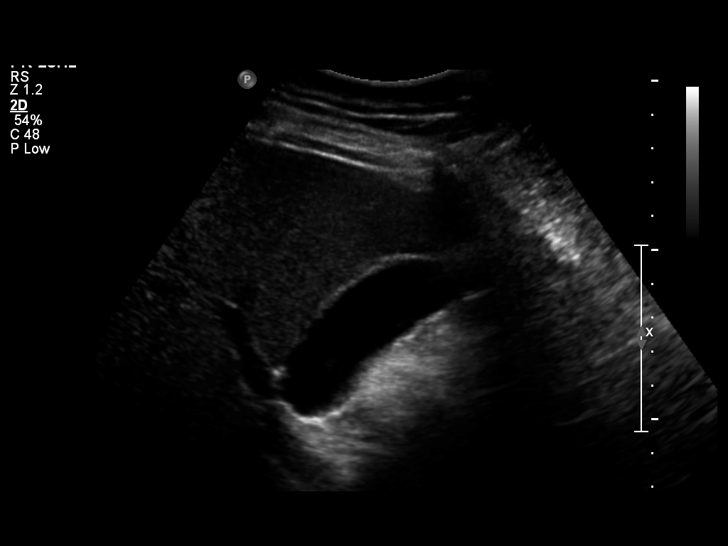
[im 69/69]
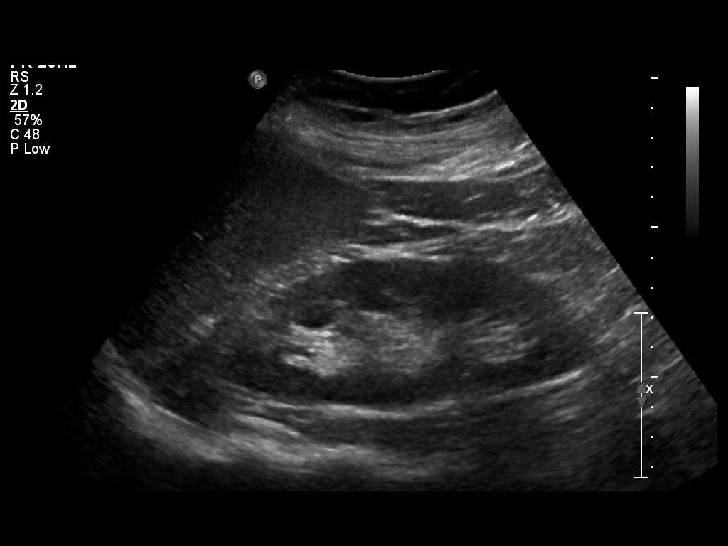

[14 of 25 positions shown; findings below may reference images not displayed]

FINDINGS: Gallbladder:  No gallstones, gallbladder wall thickening, or
pericholecystic fluid.

Common Bile Duct:  Within normal limits in caliber.  Measures 3 mm
in diameter.

Liver: No focal mass lesion identified.  Within normal limits in
parenchymal echogenicity.

IVC:  Appears normal.

Pancreas:  No abnormality identified.

Spleen:  Within normal limits in size and echotexture.

Right kidney:  Normal in size and parenchymal echogenicity.  No
evidence of mass or hydronephrosis.

Left kidney:  Normal in size and parenchymal echogenicity.  No
evidence of mass or hydronephrosis.

Abdominal Aorta:  No aneurysm identified.
IMPRESSION: Negative abdominal ultrasound.

## 2011-07-28 IMAGING — CR DG CHEST 2V
2 series · 2 of 2 positions shown · non-contrast
Comparison: None.

CLINICAL DATA: Morbid obesity.  Pre-op evaluation for bariatric
surgery.

CHEST - 2 VIEW

[view not recorded (1 of 2)]
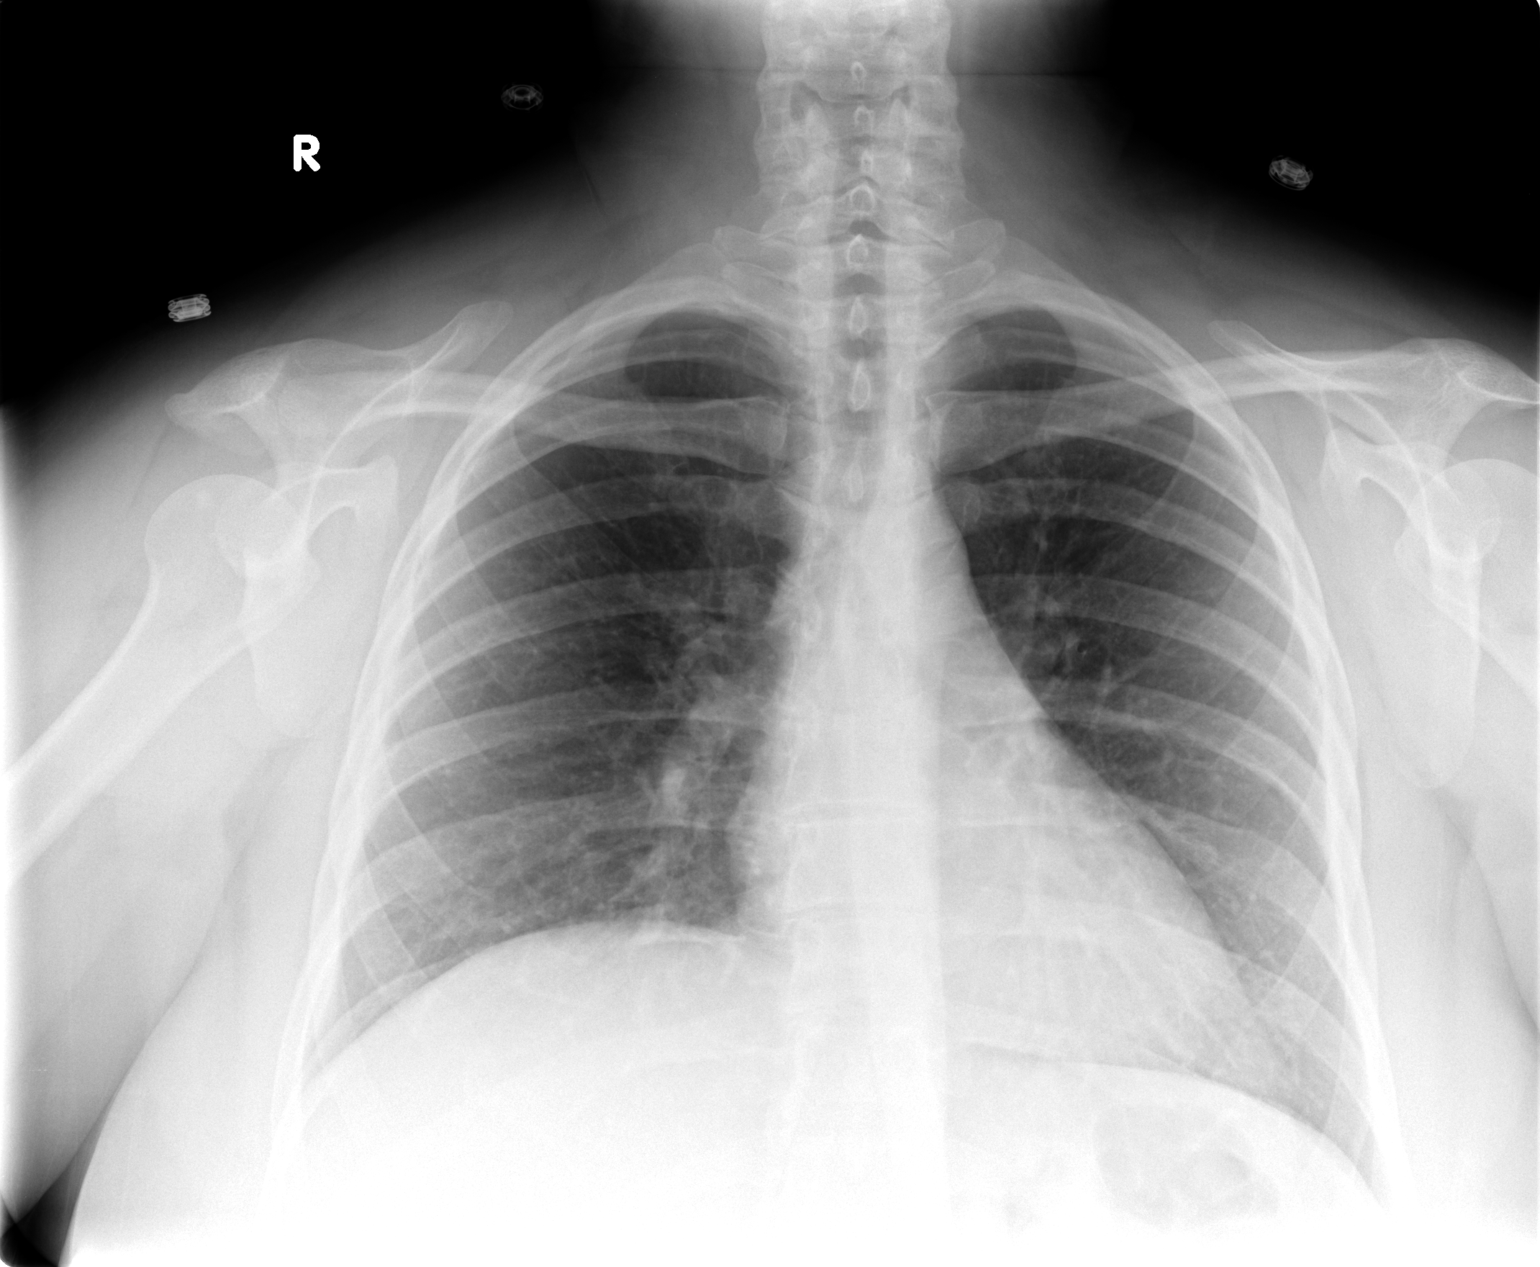

[view not recorded (2 of 2)]
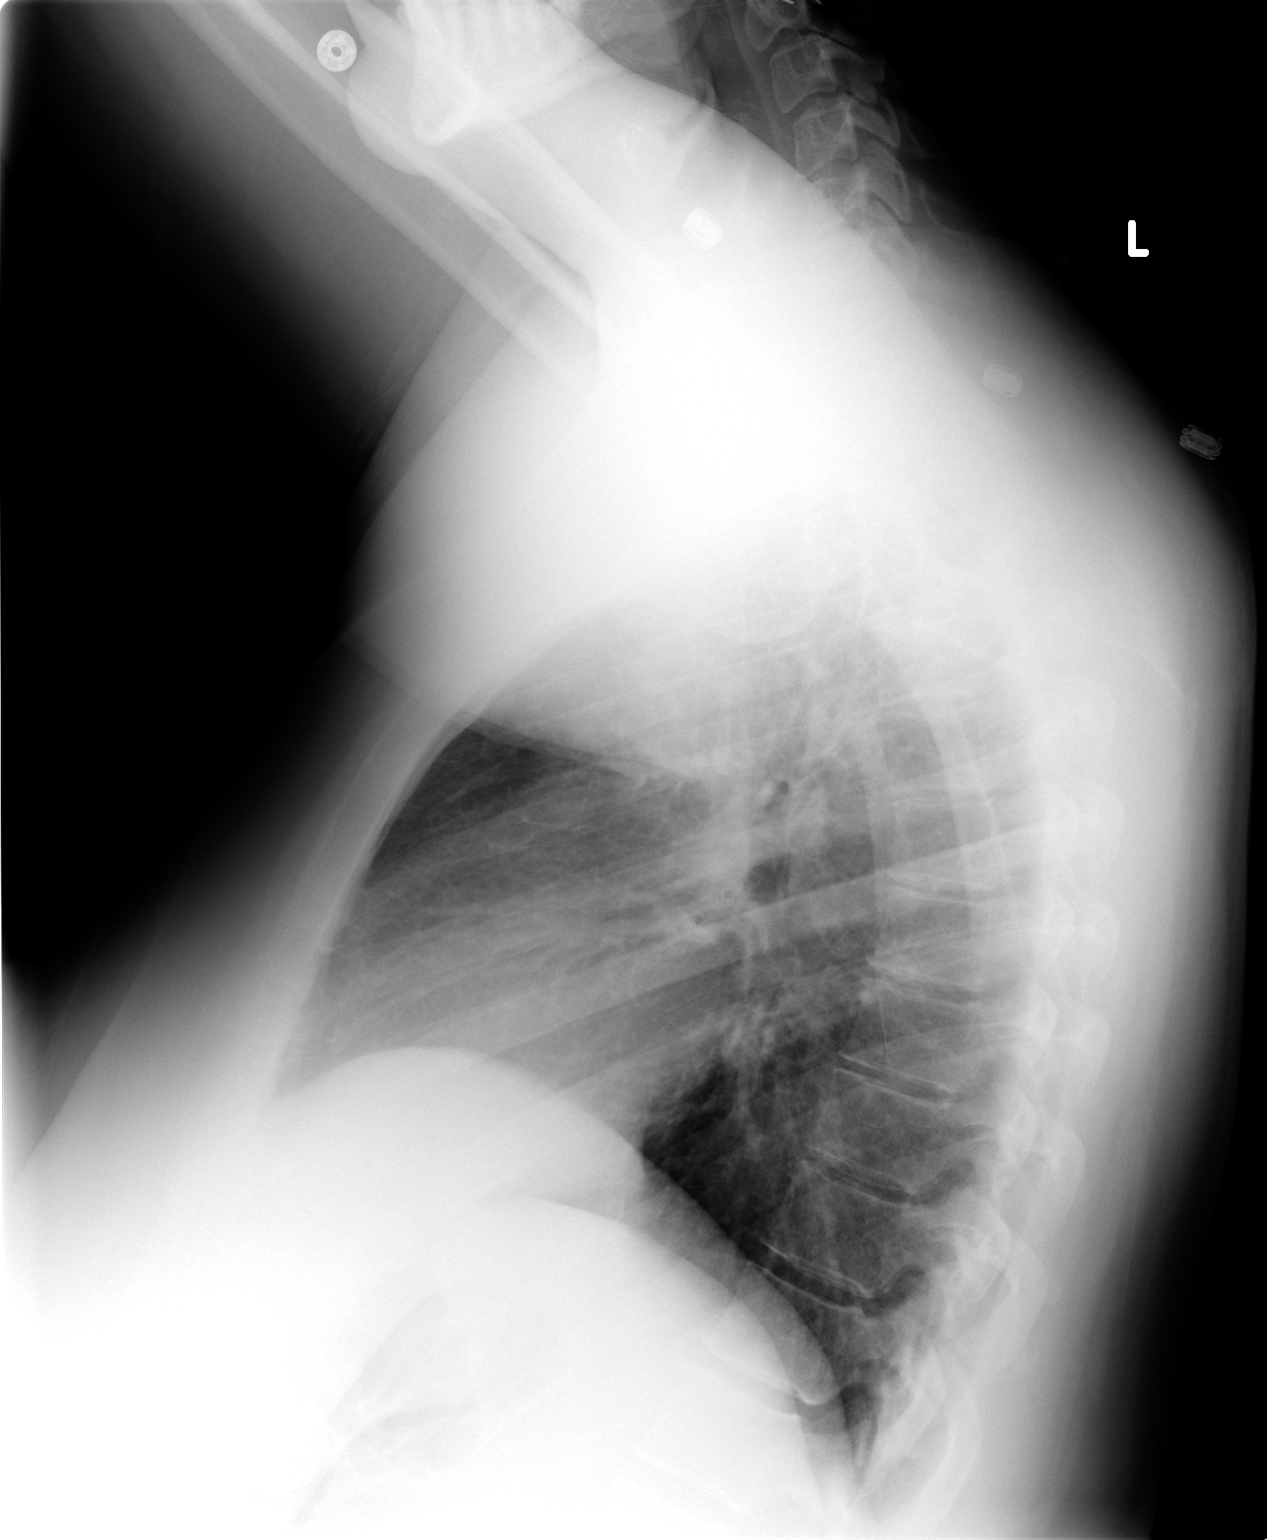

[2 of 2 positions shown; findings below may reference images not displayed]

FINDINGS: The heart size and mediastinal contours are within
normal limits.  Both lungs are clear.  The visualized skeletal
structures are unremarkable.
IMPRESSION: No active cardiopulmonary disease.

## 2011-08-26 ENCOUNTER — Encounter (INDEPENDENT_AMBULATORY_CARE_PROVIDER_SITE_OTHER): Payer: Self-pay | Admitting: General Surgery

## 2011-08-26 ENCOUNTER — Encounter (INDEPENDENT_AMBULATORY_CARE_PROVIDER_SITE_OTHER): Payer: Self-pay | Admitting: Gastroenterology

## 2011-09-24 NOTE — Patient Instructions (Signed)
Follow:    Pre-Op Diet per MD 2 weeks prior to surgery  Phase 2- Liquids (clear/full) 2 weeks after surgery  Vitamin/Mineral/Calcium guidelines for purchasing bariatric supplements  Exercise guidelines pre and post-op per MD  Follow-up at [facility] in 2 weeks post-op for diet advancement. Contact Winner Valeriano as needed with questions/concerns.  

## 2011-09-24 NOTE — Progress Notes (Signed)
  Bariatric Class:  Appt start time: 1700 end time:  1800.  Pre-Operative Nutrition Class  Patient was seen on 09/25/11 for Pre-Operative Nutrition education at the [facility].   Surgery date: 10/20/11 Surgery type: Gastric Bypass  Weight today: 254.2 Weight change: 0.5 lb gain Total weight lost: n/a BMI: 45.1  Samples given per MNT protocol:  Bariatric Advantage Multivitamin  Lot # [id number]  Exp: 9/13   Bariatric Advantage Calcium Citrate  Lot # [id number]  Exp: 12/13   Celebrate VitaminsCalcium Citrate  Lot # [id number]  Exp: 4/13   Unjury Protein - Chicken Soup  Lot # [id number]  Exp: 8/13   The following the learning objective met by the patient during this course:   Identifies Pre-Op Dietary Goals and will begin 2 weeks pre-operatively   Identifies appropriate sources of fluids and proteins   States protein recommendations and appropriate sources pre and post-operatively  Identifies Post-Operative Dietary Goals and will follow for 2 weeks post-operatively  Identifies appropriate multivitamin and calcium sources  Describes the need for physical activity post-operatively and will follow MD recommendations  States when to call healthcare provider regarding medication questions or post-operative complications  Handouts given during class include:  Pre-Op Bariatric Surgery Diet Handout  Protein Shake Handout  Post-Op Bariatric Surgery Nutrition Handout  [facility] Program Information Flyer  Support Group Information Flyer  Follow-Up Plan: Patient will follow-up at [facility] 2 weeks post operatively for diet advancement per MD.

## 2011-09-25 ENCOUNTER — Encounter: Payer: Managed Care, Other (non HMO) | Attending: General Surgery | Admitting: *Deleted

## 2011-09-25 ENCOUNTER — Encounter: Payer: Self-pay | Admitting: *Deleted

## 2011-09-25 DIAGNOSIS — Z713 Dietary counseling and surveillance: Secondary | ICD-10-CM | POA: Insufficient documentation

## 2011-09-25 DIAGNOSIS — Z01818 Encounter for other preprocedural examination: Secondary | ICD-10-CM | POA: Insufficient documentation

## 2011-10-02 ENCOUNTER — Encounter (HOSPITAL_COMMUNITY): Payer: Self-pay | Admitting: Pharmacy Technician

## 2011-10-03 ENCOUNTER — Other Ambulatory Visit (INDEPENDENT_AMBULATORY_CARE_PROVIDER_SITE_OTHER): Payer: Self-pay | Admitting: General Surgery

## 2011-10-09 ENCOUNTER — Encounter (HOSPITAL_COMMUNITY)
Admission: RE | Admit: 2011-10-09 | Discharge: 2011-10-09 | Disposition: A | Discharge status: Home / Self Care | Payer: Managed Care, Other (non HMO) | Source: Ambulatory Visit | Attending: General Surgery | Admitting: General Surgery

## 2011-10-09 ENCOUNTER — Encounter (HOSPITAL_COMMUNITY): Payer: Self-pay

## 2011-10-09 HISTORY — DX: Headache: R51

## 2011-10-09 LAB — CBC
MCH: 31.1 pg (ref 26.0–34.0)
MCHC: 33.6 g/dL (ref 30.0–36.0)
Platelets: 238 10*3/uL (ref 150–400)
RDW: 12.4 % (ref 11.5–15.5)

## 2011-10-09 LAB — COMPREHENSIVE METABOLIC PANEL
ALT: 25 U/L (ref 0–35)
Albumin: 3.3 g/dL — ABNORMAL LOW (ref 3.5–5.2)
Alkaline Phosphatase: 44 U/L (ref 39–117)
Calcium: 9 mg/dL (ref 8.4–10.5)
GFR calc Af Amer: >90 mL/min (ref >90)
Potassium: 4.3 mEq/L (ref 3.5–5.1)
Sodium: 137 mEq/L (ref 135–145)
Total Protein: 7 g/dL (ref 6.0–8.3)

## 2011-10-09 LAB — DIFFERENTIAL
Eosinophils Absolute: 0.1 10*3/uL (ref 0.0–0.7)
Eosinophils Relative: 1 % (ref 0–5)
Lymphocytes Relative: 33 % (ref 12–46)
Lymphs Abs: 3.2 10*3/uL (ref 0.7–4.0)
Monocytes Relative: 7 % (ref 3–12)
Neutrophils Relative %: 59 % (ref 43–77)

## 2011-10-09 LAB — SURGICAL PCR SCREEN
MRSA, PCR: NEGATIVE
Staphylococcus aureus: NEGATIVE

## 2011-10-09 LAB — HCG, SERUM, QUALITATIVE: Preg, Serum: NEGATIVE

## 2011-10-09 NOTE — Patient Instructions (Addendum)
20 Belinda [name]  10/09/2011   Your procedure is scheduled on:  10/20/11  Report to [facility] at 11:00 AM.  Call this number if you have problems the morning of surgery: [phone]   Remember:   Do not eat food:After Midnight.  May have clear liquids: up to 4 Hours before arrival.(7:30 am)  Clear liquids include soda, tea, black coffee, apple or grape juice, broth.  Take these medicines the morning of surgery with A SIP OF WATER:    Do not wear jewelry, make-up or nail polish.  Do not wear lotions, powders, or perfumes. You may wear deodorant.  Do not shave 48 hours prior to surgery.  Do not bring valuables to the hospital.  Contacts, dentures or bridgework may not be worn into surgery.  Leave suitcase in the car. After surgery it may be brought to your room.  For patients admitted to the hospital, checkout time is 11:00 AM the day of discharge.   Patients discharged the day of surgery will not be allowed to drive home.  Name and phone number of your driver:   Special Instructions: CHG Shower Use Special Wash: 1/2 bottle night before surgery and 1/2 bottle morning of surgery.   Please read over the following fact sheets that you were given: MRSA Information             Follow bowel prep from office             Stop aspirin / herbs / aleve, ibuprofen, motrin 7 days preop             No hormones             BRING C-PAP MASK

## 2011-10-14 ENCOUNTER — Encounter (INDEPENDENT_AMBULATORY_CARE_PROVIDER_SITE_OTHER): Payer: Self-pay | Admitting: General Surgery

## 2011-10-14 ENCOUNTER — Ambulatory Visit (INDEPENDENT_AMBULATORY_CARE_PROVIDER_SITE_OTHER): Payer: Managed Care, Other (non HMO) | Admitting: General Surgery

## 2011-10-14 NOTE — Progress Notes (Signed)
Subjective:   Obesity  Patient ID: Belinda [name], female   DOB: [birth year], 36 y.o.   MRN: [id number]  HPI Patient returns to the office for a preoperative visit prior to planned laparoscopic Roux-en-Y gastric bypass. Please see my previous note for details of her struggles with obesity. She has comorbidities of diabetes, sleep apnea, and GERD and presents with a BMI of 45 and failure of medical management. She has successfully completed her psychologic and nutrition evaluations. There were no concerns identified with her at work and ultrasound.  Past Medical History  Diagnosis Date  . GERD (gastroesophageal reflux disease)   . Substance abuse   . Sleep apnea   . Headache   . Diabetes mellitus     type 2 -  diet controlled  . Bipolar affective disorder    Past Surgical History  Procedure Date  . Septoplasty 2003   Current Outpatient Prescriptions  Medication Sig Dispense Refill  . BuPROPion HCl (WELLBUTRIN XL PO) Take 450 mg by mouth at bedtime.       . BuPROPion HCl (WELLBUTRIN XL PO)       . divalproex (DEPAKOTE ER) 500 MG 24 hr tablet Take 1,500 mg by mouth at bedtime.       . lamoTRIgine (LAMICTAL) 100 MG tablet Take 150 mg by mouth at bedtime.       . norgestimate-ethinyl estradiol (ORTHO-CYCLEN,SPRINTEC,PREVIFEM) 0.25-35 MG-MCG tablet Take 1 tablet by mouth daily.        Allergies  Allergen Reactions  . Acetaminophen Nausea Only   History  Substance Use Topics  . Smoking status: Never Smoker   . Smokeless tobacco: Never Used  . Alcohol Use: No    Review of Systems  Constitutional: Negative.   Respiratory: Negative.   Cardiovascular: Negative.   Gastrointestinal: Positive for abdominal pain (Some epigastric pain with pre op diet only).       Objective:   Physical Exam BP 120/92  Pulse 90  Temp(Src) 97.9 F (36.6 C) (Temporal)  Ht 5\' 2"  (1.575 m)  Wt 248 lb 6.4 oz (112.674 kg)  BMI 45.43 kg/m2  SpO2 98%  LMP 09/18/2011 Body mass index is 45.43  kg/(m^2).  General: Alert, morbidly obese Caucasian female, in no distress Skin: Warm and dry without rash or infection. HEENT: No palpable masses or thyromegaly. Sclera nonicteric. Pupils equal round and reactive. Oropharynx clear. Lymph nodes: No cervical, supraclavicular, or inguinal nodes palpable. Lungs: Breath sounds clear and equal without increased work of breathing Cardiovascular: Regular rate and rhythm without murmur. No JVD or edema. Peripheral pulses intact. Abdomen: Nondistended. Soft and nontender. No masses palpable. No organomegaly. No palpable hernias. Extremities: No edema or joint swelling or deformity. No chronic venous stasis changes. Neurologic: Alert and fully oriented. Gait normal.     Assessment:     Morbid obesity with comorbidities of diabetes mellitus, sleep apnea, and GERD.    Plan:     Laparoscopic Roux-en-Y gastric bypass

## 2011-10-20 ENCOUNTER — Encounter (HOSPITAL_COMMUNITY)
Admission: RE | Disposition: A | Discharge status: Home / Self Care | Payer: Self-pay | Source: Ambulatory Visit | Attending: General Surgery

## 2011-10-20 ENCOUNTER — Encounter (HOSPITAL_COMMUNITY): Payer: Self-pay

## 2011-10-20 ENCOUNTER — Inpatient Hospital Stay (HOSPITAL_COMMUNITY)
Admission: RE | Admit: 2011-10-20 | Discharge: 2011-10-22 | DRG: 621 | Disposition: A | Discharge status: Home / Self Care | Payer: Managed Care, Other (non HMO) | Source: Ambulatory Visit | Attending: General Surgery | Admitting: General Surgery

## 2011-10-20 ENCOUNTER — Inpatient Hospital Stay (HOSPITAL_COMMUNITY): Payer: Managed Care, Other (non HMO) | Admitting: Anesthesiology

## 2011-10-20 ENCOUNTER — Encounter (HOSPITAL_COMMUNITY): Payer: Self-pay | Admitting: Anesthesiology

## 2011-10-20 DIAGNOSIS — Z6841 Body Mass Index (BMI) 40.0 and over, adult: Secondary | ICD-10-CM

## 2011-10-20 DIAGNOSIS — F319 Bipolar disorder, unspecified: Secondary | ICD-10-CM | POA: Diagnosis present

## 2011-10-20 DIAGNOSIS — G4733 Obstructive sleep apnea (adult) (pediatric): Secondary | ICD-10-CM

## 2011-10-20 DIAGNOSIS — K219 Gastro-esophageal reflux disease without esophagitis: Secondary | ICD-10-CM | POA: Diagnosis present

## 2011-10-20 DIAGNOSIS — E119 Type 2 diabetes mellitus without complications: Secondary | ICD-10-CM

## 2011-10-20 HISTORY — PX: GASTRIC ROUX-EN-Y: SHX5262

## 2011-10-20 LAB — GLUCOSE, CAPILLARY

## 2011-10-20 LAB — PREGNANCY, URINE: Preg Test, Ur: NEGATIVE

## 2011-10-20 LAB — HEMOGLOBIN AND HEMATOCRIT, BLOOD
HCT: 38.1 % (ref 36.0–46.0)
Hemoglobin: 12.6 g/dL (ref 12.0–15.0)

## 2011-10-20 SURGERY — LAPAROSCOPIC ROUX-EN-Y GASTRIC
Anesthesia: General | Site: Abdomen | Wound class: Clean Contaminated

## 2011-10-20 MED ORDER — LACTATED RINGERS IR SOLN
Status: DC | PRN
  Administered 2011-10-20: 3000 mL

## 2011-10-20 MED ORDER — PROMETHAZINE HCL 25 MG/ML IJ SOLN: 6.2500 mg | INTRAMUSCULAR | Status: DC | PRN

## 2011-10-20 MED ORDER — TISSEEL VH 10 ML EX KIT
PACK | CUTANEOUS | Status: DC | PRN
  Administered 2011-10-20: 10 mL

## 2011-10-20 MED ORDER — ACETAMINOPHEN 10 MG/ML IV SOLN
1000.0000 mg | Freq: Four times a day (QID) | INTRAVENOUS | Status: AC
  Administered 2011-10-20 – 2011-10-21 (×4): 1000 mg via INTRAVENOUS
  Filled 2011-10-20 (×5): qty 100

## 2011-10-20 MED ORDER — BUPIVACAINE-EPINEPHRINE 0.5% -1:200000 IJ SOLN
INTRAMUSCULAR | Status: AC
  Filled 2011-10-20: qty 1

## 2011-10-20 MED ORDER — GLYCOPYRROLATE 0.2 MG/ML IJ SOLN
INTRAMUSCULAR | Status: DC | PRN
  Administered 2011-10-20: .8 mg via INTRAVENOUS

## 2011-10-20 MED ORDER — UNJURY VANILLA POWDER: 2.0000 [oz_av] | Freq: Four times a day (QID) | ORAL | Status: DC

## 2011-10-20 MED ORDER — BUPIVACAINE LIPOSOME 1.3 % IJ SUSP
20.0000 mL | Freq: Once | INTRAMUSCULAR | Status: AC
  Administered 2011-10-20: 20 mL
  Filled 2011-10-20: qty 20

## 2011-10-20 MED ORDER — SODIUM CHLORIDE 0.9 % IJ SOLN
INTRAMUSCULAR | Status: DC | PRN
  Administered 2011-10-20: 20 mL

## 2011-10-20 MED ORDER — MORPHINE SULFATE 2 MG/ML IJ SOLN: 2.0000 mg | INTRAMUSCULAR | Status: DC | PRN

## 2011-10-20 MED ORDER — CEFOXITIN SODIUM-DEXTROSE 1-4 GM-% IV SOLR (PREMIX)
INTRAVENOUS | Status: AC
  Filled 2011-10-20: qty 100

## 2011-10-20 MED ORDER — MIDAZOLAM HCL 5 MG/5ML IJ SOLN
INTRAMUSCULAR | Status: DC | PRN
  Administered 2011-10-20: 2 mg via INTRAVENOUS

## 2011-10-20 MED ORDER — PROPOFOL 10 MG/ML IV EMUL
INTRAVENOUS | Status: DC | PRN
  Administered 2011-10-20: 200 mg via INTRAVENOUS

## 2011-10-20 MED ORDER — LIDOCAINE HCL (CARDIAC) 20 MG/ML IV SOLN
INTRAVENOUS | Status: DC | PRN
  Administered 2011-10-20: 50 mg via INTRAVENOUS

## 2011-10-20 MED ORDER — UNJURY CHICKEN SOUP POWDER: 2.0000 [oz_av] | Freq: Four times a day (QID) | ORAL | Status: DC

## 2011-10-20 MED ORDER — HYDROMORPHONE HCL PF 1 MG/ML IJ SOLN
0.2500 mg | INTRAMUSCULAR | Status: DC | PRN
  Administered 2011-10-20: 0.25 mg via INTRAVENOUS

## 2011-10-20 MED ORDER — LABETALOL HCL 5 MG/ML IV SOLN
INTRAVENOUS | Status: DC | PRN
  Administered 2011-10-20 (×2): 10 mg via INTRAVENOUS

## 2011-10-20 MED ORDER — HYDROMORPHONE HCL PF 1 MG/ML IJ SOLN
INTRAMUSCULAR | Status: AC
  Filled 2011-10-20: qty 1

## 2011-10-20 MED ORDER — ONDANSETRON HCL 4 MG/2ML IJ SOLN
INTRAMUSCULAR | Status: DC | PRN
  Administered 2011-10-20: 4 mg via INTRAVENOUS

## 2011-10-20 MED ORDER — HYDROMORPHONE HCL PF 1 MG/ML IJ SOLN
INTRAMUSCULAR | Status: DC | PRN
  Administered 2011-10-20 (×2): 1 mg via INTRAVENOUS

## 2011-10-20 MED ORDER — ACETAMINOPHEN 10 MG/ML IV SOLN
INTRAVENOUS | Status: AC
  Filled 2011-10-20: qty 100

## 2011-10-20 MED ORDER — POTASSIUM CHLORIDE IN NACL 20-0.9 MEQ/L-% IV SOLN
INTRAVENOUS | Status: DC
  Administered 2011-10-20 – 2011-10-22 (×6): via INTRAVENOUS
  Filled 2011-10-20 (×8): qty 1000

## 2011-10-20 MED ORDER — DEXTROSE 5 % IV SOLN
2.0000 g | INTRAVENOUS | Status: AC
  Administered 2011-10-20: 2 g via INTRAVENOUS

## 2011-10-20 MED ORDER — FENTANYL CITRATE 0.05 MG/ML IJ SOLN
INTRAMUSCULAR | Status: DC | PRN
  Administered 2011-10-20: 100 ug via INTRAVENOUS
  Administered 2011-10-20: 50 ug via INTRAVENOUS
  Administered 2011-10-20: 100 ug via INTRAVENOUS
  Administered 2011-10-20 (×2): 50 ug via INTRAVENOUS
  Administered 2011-10-20: 100 ug via INTRAVENOUS
  Administered 2011-10-20: 150 ug via INTRAVENOUS

## 2011-10-20 MED ORDER — ACETAMINOPHEN 10 MG/ML IV SOLN
INTRAVENOUS | Status: DC | PRN
  Administered 2011-10-20: 1000 mg via INTRAVENOUS

## 2011-10-20 MED ORDER — HEPARIN SODIUM (PORCINE) 5000 UNIT/ML IJ SOLN
5000.0000 [IU] | INTRAMUSCULAR | Status: AC
  Administered 2011-10-20: 5000 [IU] via SUBCUTANEOUS

## 2011-10-20 MED ORDER — UNJURY CHOCOLATE CLASSIC POWDER
2.0000 [oz_av] | Freq: Four times a day (QID) | ORAL | Status: DC
  Administered 2011-10-22: 2 [oz_av] via ORAL

## 2011-10-20 MED ORDER — LACTATED RINGERS IV SOLN
INTRAVENOUS | Status: DC
  Administered 2011-10-20 (×2): 1000 mL via INTRAVENOUS

## 2011-10-20 MED ORDER — ROCURONIUM BROMIDE 100 MG/10ML IV SOLN
INTRAVENOUS | Status: DC | PRN
  Administered 2011-10-20: 50 mg via INTRAVENOUS
  Administered 2011-10-20 (×3): 10 mg via INTRAVENOUS

## 2011-10-20 MED ORDER — ONDANSETRON HCL 4 MG/2ML IJ SOLN
4.0000 mg | INTRAMUSCULAR | Status: DC | PRN
  Administered 2011-10-20 – 2011-10-22 (×8): 4 mg via INTRAVENOUS
  Filled 2011-10-20 (×7): qty 2

## 2011-10-20 MED ORDER — DEXAMETHASONE SODIUM PHOSPHATE 10 MG/ML IJ SOLN
INTRAMUSCULAR | Status: DC | PRN
  Administered 2011-10-20: 10 mg via INTRAVENOUS

## 2011-10-20 MED ORDER — ACETAMINOPHEN 160 MG/5ML PO SOLN
650.0000 mg | ORAL | Status: DC | PRN
  Filled 2011-10-20: qty 20.3

## 2011-10-20 MED ORDER — HEPARIN SODIUM (PORCINE) 5000 UNIT/ML IJ SOLN
5000.0000 [IU] | Freq: Three times a day (TID) | INTRAMUSCULAR | Status: DC
  Administered 2011-10-21 – 2011-10-22 (×3): 5000 [IU] via SUBCUTANEOUS
  Filled 2011-10-20 (×6): qty 1

## 2011-10-20 MED ORDER — SUCCINYLCHOLINE CHLORIDE 20 MG/ML IJ SOLN
INTRAMUSCULAR | Status: DC | PRN
  Administered 2011-10-20: 100 mg via INTRAVENOUS

## 2011-10-20 MED ORDER — LACTATED RINGERS IV SOLN
INTRAVENOUS | Status: DC | PRN
  Administered 2011-10-20 (×3): via INTRAVENOUS

## 2011-10-20 MED ORDER — OXYCODONE-ACETAMINOPHEN 5-325 MG/5ML PO SOLN: 5.0000 mL | ORAL | Status: DC | PRN

## 2011-10-20 MED ORDER — NEOSTIGMINE METHYLSULFATE 1 MG/ML IJ SOLN
INTRAMUSCULAR | Status: DC | PRN
  Administered 2011-10-20: 5 mg via INTRAVENOUS

## 2011-10-20 MED ORDER — FIBRIN SEALANT COMPONENT 5 ML EX KIT
PACK | CUTANEOUS | Status: AC
  Filled 2011-10-20: qty 2

## 2011-10-20 MED ORDER — LACTATED RINGERS IV SOLN: INTRAVENOUS | Status: DC

## 2011-10-20 SURGICAL SUPPLY — 73 items
APPLICATOR COTTON TIP 6IN STRL (MISCELLANEOUS) ×4 IMPLANT
APPLIER CLIP ROT 13.4 12 LRG (CLIP)
BENZOIN TINCTURE PRP APPL 2/3 (GAUZE/BANDAGES/DRESSINGS) IMPLANT
BLADE SURG 15 STRL LF DISP TIS (BLADE) IMPLANT
BLADE SURG 15 STRL SS (BLADE)
BLADE SURG SZ11 CARB STEEL (BLADE) ×2 IMPLANT
CABLE HIGH FREQUENCY MONO STRZ (ELECTRODE) ×2 IMPLANT
CANISTER SUCTION 2500CC (MISCELLANEOUS) ×2 IMPLANT
CHLORAPREP W/TINT 26ML (MISCELLANEOUS) ×2 IMPLANT
CLIP APPLIE ROT 13.4 12 LRG (CLIP) IMPLANT
CLIP SUT LAPRA TY ABSORB (SUTURE) ×4 IMPLANT
CLOTH BEACON ORANGE TIMEOUT ST (SAFETY) ×2 IMPLANT
COVER SURGICAL LIGHT HANDLE (MISCELLANEOUS) ×2 IMPLANT
CUTTER LINEAR ENDO ART 45 ETS (STAPLE) ×2 IMPLANT
DECANTER SPIKE VIAL GLASS SM (MISCELLANEOUS) ×4 IMPLANT
DERMABOND ADVANCED (GAUZE/BANDAGES/DRESSINGS) ×2
DERMABOND ADVANCED .7 DNX12 (GAUZE/BANDAGES/DRESSINGS) ×2 IMPLANT
DEVICE SUTURE ENDOST 10MM (ENDOMECHANICALS) ×2 IMPLANT
DISSECTOR BLUNT TIP ENDO 5MM (MISCELLANEOUS) IMPLANT
DRAIN PENROSE 18X1/4 LTX STRL (WOUND CARE) ×2 IMPLANT
DRAPE CAMERA CLOSED 9X96 (DRAPES) ×2 IMPLANT
ELECT REM PT RETURN 9FT ADLT (ELECTROSURGICAL) ×2
ELECTRODE REM PT RTRN 9FT ADLT (ELECTROSURGICAL) ×1 IMPLANT
GAUZE SPONGE 4X4 16PLY XRAY LF (GAUZE/BANDAGES/DRESSINGS) ×2 IMPLANT
GLOVE BIOGEL PI IND STRL 7.0 (GLOVE) ×1 IMPLANT
GLOVE BIOGEL PI INDICATOR 7.0 (GLOVE) ×1
GOWN STRL NON-REIN LRG LVL3 (GOWN DISPOSABLE) ×2 IMPLANT
GOWN STRL REIN XL XLG (GOWN DISPOSABLE) ×4 IMPLANT
HEMOSTAT SURGICEL 4X8 (HEMOSTASIS) IMPLANT
HOVERMATT SINGLE USE (MISCELLANEOUS) ×2 IMPLANT
KIT BASIN OR (CUSTOM PROCEDURE TRAY) ×2 IMPLANT
KIT GASTRIC LAVAGE 34FR ADT (SET/KITS/TRAYS/PACK) ×2 IMPLANT
NEEDLE SPNL 22GX3.5 QUINCKE BK (NEEDLE) ×2 IMPLANT
NS IRRIG 1000ML POUR BTL (IV SOLUTION) ×2 IMPLANT
PACK CARDIOVASCULAR III (CUSTOM PROCEDURE TRAY) ×2 IMPLANT
PEN SKIN MARKING BROAD (MISCELLANEOUS) ×2 IMPLANT
POUCH SPECIMEN RETRIEVAL 10MM (ENDOMECHANICALS) IMPLANT
RELOAD 45 VASCULAR/THIN (ENDOMECHANICALS) ×4 IMPLANT
RELOAD BLUE (STAPLE) ×4 IMPLANT
RELOAD ENDO STITCH 2.0 (ENDOMECHANICALS) ×8
RELOAD GOLD (STAPLE) ×2 IMPLANT
RELOAD STAPLE TA45 3.5 REG BLU (ENDOMECHANICALS) ×2 IMPLANT
RELOAD WHITE ECR60W (STAPLE) ×2 IMPLANT
SCALPEL HARMONIC ACE (MISCELLANEOUS) ×2 IMPLANT
SCISSORS LAP 5X35 DISP (ENDOMECHANICALS) ×2 IMPLANT
SEALANT SURGICAL APPL DUAL CAN (MISCELLANEOUS) ×2 IMPLANT
SET IRRIG TUBING LAPAROSCOPIC (IRRIGATION / IRRIGATOR) ×2 IMPLANT
SLEEVE ADV FIXATION 12X100MM (TROCAR) ×4 IMPLANT
SLEEVE ADV FIXATION 5X100MM (TROCAR) ×2 IMPLANT
SOLUTION ANTI FOG 6CC (MISCELLANEOUS) ×2 IMPLANT
SPONGE GAUZE 4X4 12PLY (GAUZE/BANDAGES/DRESSINGS) ×2 IMPLANT
STAPLER STANDARD HANDLE (STAPLE) ×2 IMPLANT
STAPLER VISISTAT 35W (STAPLE) ×2 IMPLANT
STRIP CLOSURE SKIN 1/2X4 (GAUZE/BANDAGES/DRESSINGS) IMPLANT
SUT ETHILON 3 0 PS 1 (SUTURE) IMPLANT
SUT MNCRL AB 4-0 PS2 18 (SUTURE) ×2 IMPLANT
SUT RELOAD ENDO STITCH 2 48X1 (ENDOMECHANICALS) ×5
SUT RELOAD ENDO STITCH 2.0 (ENDOMECHANICALS) ×3
SUT VIC AB 2-0 SH 27 (SUTURE) ×1
SUT VIC AB 2-0 SH 27X BRD (SUTURE) ×1 IMPLANT
SUTURE RELOAD END STTCH 2 48X1 (ENDOMECHANICALS) ×5 IMPLANT
SUTURE RELOAD ENDO STITCH 2.0 (ENDOMECHANICALS) ×3 IMPLANT
SYR 20CC LL (SYRINGE) ×2 IMPLANT
SYR 50ML LL SCALE MARK (SYRINGE) ×2 IMPLANT
SYR CONTROL 10ML LL (SYRINGE) ×2 IMPLANT
TOWEL OR 17X26 10 PK STRL BLUE (TOWEL DISPOSABLE) ×2 IMPLANT
TRAY FOLEY CATH 14FRSI W/METER (CATHETERS) ×2 IMPLANT
TROCAR ADV FIXATION 12X100MM (TROCAR) ×2 IMPLANT
TROCAR XCEL 12X100 BLDLESS (ENDOMECHANICALS) ×2 IMPLANT
TROCAR Z-THREAD FIOS 5X100MM (TROCAR) ×2 IMPLANT
TUBING ENDO SMARTCAP (MISCELLANEOUS) ×2 IMPLANT
TUBING FILTER THERMOFLATOR (ELECTROSURGICAL) ×2 IMPLANT
WATER STERILE IRR 1500ML POUR (IV SOLUTION) ×2 IMPLANT

## 2011-10-20 NOTE — Transfer of Care (Signed)
Immediate Anesthesia Transfer of Care Note  Patient: Belinda [name]  Procedure(s) Performed:  LAPAROSCOPIC ROUX-EN-Y GASTRIC - upper endoscopy  Patient Location: PACU  Anesthesia Type: General  Level of Consciousness: awake, alert  and oriented  Airway & Oxygen Therapy: Patient Spontanous Breathing and Patient connected to face mask oxygen  Post-op Assessment: Report given to PACU RN and Post -op Vital signs reviewed and stable  Post vital signs: Reviewed and stable Filed Vitals:   10/20/11 0938  BP: 129/94  Pulse: 102  Temp: 36.7 C  Resp: 18    Complications: No apparent anesthesia complications

## 2011-10-20 NOTE — Anesthesia Procedure Notes (Signed)
Procedure Name: Intubation Date/Time: 10/20/2011 1:12 PM Performed by: [place], Calista Crain C Pre-anesthesia Checklist: Patient identified, Timeout performed, Emergency Drugs available, Suction available and Patient being monitored Patient Re-evaluated:Patient Re-evaluated prior to inductionOxygen Delivery Method: Circle System Utilized Preoxygenation: Pre-oxygenation with 100% oxygen Intubation Type: IV induction Ventilation: Mask ventilation without difficulty Laryngoscope Size: Mac and 3 Grade View: Grade I Tube type: Oral Tube size: 7.5 mm Number of attempts: 1 Airway Equipment and Method: stylet Secured at: 21 cm Tube secured with: Tape Dental Injury: Teeth and Oropharynx as per pre-operative assessment

## 2011-10-20 NOTE — Anesthesia Preprocedure Evaluation (Addendum)
Anesthesia Evaluation  Patient identified by MRN, date of birth, ID band Patient awake    Reviewed: Allergy & Precautions, H&P , NPO status , Patient's Chart, lab work & pertinent test results  Airway Mallampati: III TM Distance: >3 FB Neck ROM: full    Dental No notable dental hx. (+) Teeth Intact and Dental Advisory Given   Pulmonary neg pulmonary ROS, sleep apnea and Continuous Positive Airway Pressure Ventilation ,  clear to auscultation  Pulmonary exam normal       Cardiovascular Exercise Tolerance: Good neg cardio ROS regular Normal    Neuro/Psych  Headaches, Negative Neurological ROS  Negative Psych ROS   GI/Hepatic negative GI ROS, Neg liver ROS, GERD-  ,  Endo/Other  Negative Endocrine ROSDiabetes mellitus-, Type 2Diet controlled DM  Renal/GU negative Renal ROS  Genitourinary negative   Musculoskeletal   Abdominal (+) obese,   Peds  Hematology negative hematology ROS (+)   Anesthesia Other Findings   Reproductive/Obstetrics negative OB ROS                          Anesthesia Physical Anesthesia Plan  ASA: III  Anesthesia Plan: General   Post-op Pain Management:    Induction: Intravenous  Airway Management Planned: Oral ETT  Additional Equipment:   Intra-op Plan:   Post-operative Plan: Extubation in OR  Informed Consent: I have reviewed the patients History and Physical, chart, labs and discussed the procedure including the risks, benefits and alternatives for the proposed anesthesia with the patient or authorized representative who has indicated his/her understanding and acceptance.   Dental Advisory Given  Plan Discussed with: CRNA and Surgeon  Anesthesia Plan Comments:         Anesthesia Quick Evaluation

## 2011-10-20 NOTE — Op Note (Signed)
Preop diagnosis: Morbid obesity  Postop diagnosis: Morbid obesity  Surgical procedure: Laparoscopic Roux-en-Y gastric bypass  Surgeon: Sharlet Salina T.Merrianne Mccumbers M.D.  Asst.: Luretha Murphy M.D.  Anesthesia: General  Complications:  None  EBL: Minimal  Drains: None  Disposition: PACU in good condition  Description of procedure: Patient is brought to the operating room and general anesthesia induced. She had received preoperative broad-spectrum IV antibiotics and subcutaneous heparin. The abdomen was widely sterilely prepped and draped. Patient timeout was performed and correct patient and procedure confirmed. Access was obtained with a 12 mm Optiview trocar in the left upper quadrant and pneumoperitoneum established without difficulty. Under direct vision 12 mm trocars were placed laterally in the right upper quadrant, right upper quadrant midclavicular line, and to the left and above the umbilicus for the camera port. A 5 mm trocar was placed laterally in the left upper quadrant. The omentum was brought into the upper abdomen and the transverse mesocolon elevated and the ligament of Treitz clearly identified. A 40 cm biliopancreatic limb was then carefully measured from the ligament of Treitz. The small intestine was divided at this point with a single firing of the white load linear stapler. A Penrose drain was sutured to the end of the Roux-en-Y limb for later identification. A 100 cm Roux-en-Y limb was then carefully measured. At this point a side-to-side anastomosis was created between the Roux limb and the end of the biliopancreatic limb. This was accomplished with a single firing of the 45 mm white load linear stapler. The common enterotomy was closed with a running 2-0 Vicryl begun at either end of the enterotomy and tied centrally. The mesenteric defect was then closed with running 2-0 silk. The omentum was then divided with the harmonic scalpel up towards the transverse colon to allow mobility  of the Roux limb toward the gastric pouch. The patient was then placed in steep reversed Trendelenburg. Through a 5 mm subxiphoid site the [facility] retractor was placed and the left lobe of the liver elevated with excellent exposure of the upper stomach and hiatus. The angle of Hiss was then mobilized with the harmonic scalpel. A 4 cm gastric pouch was then carefully measured along the lesser curve of the stomach. Dissection was carried along the lesser curve at this point with the Harmonic scalpel working carefully back toward the lesser sac at right angles to the lesser curve. The free lesser sac was then entered. After being sure all tubes were removed from the stomach an initial firing of the gold load 60 mm linear stapler was fired at right angles across the lesser curve for about 4 cm. The gastric pouch was further mobilized posteriorly and then the pouch was completed with 2 further firings of the 60 mm blue load linear stapler up through the previously dissected angle of His. It was ensured that the pouch was completely mobilized away from the gastric remnant. This created a nice tubular 4-5 cm gastric pouch. The staple line of the gastric remnant was then oversewn with 2-0 silk for hemostasis. The Roux limb was then brought up in an antecolic fashion with the candycane facing to the patient's left without undue tension. The gastrojejunostomy was created with an initial posterior row of 2-0 Vicryl between the Roux limb and the staple line of the gastric pouch. Enterotomies were then made in the gastric pouch and the Roux limb with the harmonic scalpel and at approximately 2-2-1/2 cm anastomosis was created with a single firing of the blue load linear stapler.  The staple line was inspected and was intact without bleeding. The common enterotomy was then closed with running 2-0 Vicryl begun at either end and tied centrally. The wall tube was then easily passed through the anastomosis and an outer anterior  layer of running 2-0 Vicryl was placed. The Ewald tube was removed. With the outlet of the gastrojejunostomy clamped and under saline irrigation the assistant performed upper endoscopy and with the gastric pouch tensely distended with air there was no evidence of leak. The pouch was desufflated. The Vonita Moss defect was closed with running 2-0 silk. The abdomen was inspected for any evidence of bleeding or bowel injury and everything looked fine. The Nathanson retractor was removed under direct vision after coating the anastomosis with Tisseel tissue sealant. All CO2 was evacuated and trochars removed. Skin incisions were closed with staples. Sponge needle and instrument counts were correct. The patient was taken to the PACU in good condition.     Mariella Saa MD, FACS  10/20/2011, 4:42 PM

## 2011-10-20 NOTE — H&P (View-Only) (Signed)
Subjective:   Obesity  Patient ID: Belinda [name], female   DOB: 01/08/1976, 35 y.o.   MRN: [id number]  HPI Patient returns to the office for a preoperative visit prior to planned laparoscopic Roux-en-Y gastric bypass. Please see my previous note for details of her struggles with obesity. She has comorbidities of diabetes, sleep apnea, and GERD and presents with a BMI of 45 and failure of medical management. She has successfully completed her psychologic and nutrition evaluations. There were no concerns identified with her at work and ultrasound.  Past Medical History  Diagnosis Date  . GERD (gastroesophageal reflux disease)   . Substance abuse   . Sleep apnea   . Headache   . Diabetes mellitus     type 2 -  diet controlled  . Bipolar affective disorder    Past Surgical History  Procedure Date  . Septoplasty 2003   Current Outpatient Prescriptions  Medication Sig Dispense Refill  . BuPROPion HCl (WELLBUTRIN XL PO) Take 450 mg by mouth at bedtime.       . BuPROPion HCl (WELLBUTRIN XL PO)       . divalproex (DEPAKOTE ER) 500 MG 24 hr tablet Take 1,500 mg by mouth at bedtime.       . lamoTRIgine (LAMICTAL) 100 MG tablet Take 150 mg by mouth at bedtime.       . norgestimate-ethinyl estradiol (ORTHO-CYCLEN,SPRINTEC,PREVIFEM) 0.25-35 MG-MCG tablet Take 1 tablet by mouth daily.        Allergies  Allergen Reactions  . Acetaminophen Nausea Only   History  Substance Use Topics  . Smoking status: Never Smoker   . Smokeless tobacco: Never Used  . Alcohol Use: No    Review of Systems  Constitutional: Negative.   Respiratory: Negative.   Cardiovascular: Negative.   Gastrointestinal: Positive for abdominal pain (Some epigastric pain with pre op diet only).       Objective:   Physical Exam BP 120/92  Pulse 90  Temp(Src) 97.9 F (36.6 C) (Temporal)  Ht 5' 2" (1.575 m)  Wt 248 lb 6.4 oz (112.674 kg)  BMI 45.43 kg/m2  SpO2 98%  LMP 09/18/2011 Body mass index is 45.43  kg/(m^2).  General: Alert, morbidly obese Caucasian female, in no distress Skin: Warm and dry without rash or infection. HEENT: No palpable masses or thyromegaly. Sclera nonicteric. Pupils equal round and reactive. Oropharynx clear. Lymph nodes: No cervical, supraclavicular, or inguinal nodes palpable. Lungs: Breath sounds clear and equal without increased work of breathing Cardiovascular: Regular rate and rhythm without murmur. No JVD or edema. Peripheral pulses intact. Abdomen: Nondistended. Soft and nontender. No masses palpable. No organomegaly. No palpable hernias. Extremities: No edema or joint swelling or deformity. No chronic venous stasis changes. Neurologic: Alert and fully oriented. Gait normal.     Assessment:     Morbid obesity with comorbidities of diabetes mellitus, sleep apnea, and GERD.    Plan:     Laparoscopic Roux-en-Y gastric bypass      

## 2011-10-20 NOTE — Anesthesia Postprocedure Evaluation (Signed)
  Anesthesia Post-op Note  Patient: Belinda [name]  Procedure(s) Performed:  LAPAROSCOPIC ROUX-EN-Y GASTRIC - upper endoscopy  Patient Location: PACU  Anesthesia Type: General  Level of Consciousness: awake and alert   Airway and Oxygen Therapy: Patient Spontanous Breathing  Post-op Pain: mild  Post-op Assessment: Post-op Vital signs reviewed, Patient's Cardiovascular Status Stable, Respiratory Function Stable, Patent Airway and No signs of Nausea or vomiting  Post-op Vital Signs: stable  Complications: No apparent anesthesia complications

## 2011-10-20 NOTE — Interval H&P Note (Signed)
History and Physical Interval Note:  10/20/2011 12:31 PM  Belinda [name]  has presented today for surgery, with the diagnosis of morbid obesity   The various methods of treatment have been discussed with the patient and family. After consideration of risks, benefits and other options for treatment, the patient has consented to  Procedure(s): LAPAROSCOPIC ROUX-EN-Y GASTRIC as a surgical intervention .  The patients' history has been reviewed, patient examined, no change in status, stable for surgery.  I have reviewed the patients' chart and labs.  Questions were answered to the patient's satisfaction.     Sima Lindenberger T

## 2011-10-21 ENCOUNTER — Inpatient Hospital Stay (HOSPITAL_COMMUNITY): Payer: Managed Care, Other (non HMO)

## 2011-10-21 DIAGNOSIS — Z48812 Encounter for surgical aftercare following surgery on the circulatory system: Secondary | ICD-10-CM

## 2011-10-21 LAB — DIFFERENTIAL
Lymphocytes Relative: 17 % (ref 12–46)
Lymphs Abs: 1.9 10*3/uL (ref 0.7–4.0)
Monocytes Relative: 8 % (ref 3–12)
Neutro Abs: 8.3 10*3/uL — ABNORMAL HIGH (ref 1.7–7.7)
Neutrophils Relative %: 75 % (ref 43–77)

## 2011-10-21 LAB — CBC
HCT: 36.2 % (ref 36.0–46.0)
Hemoglobin: 12.4 g/dL (ref 12.0–15.0)
RBC: 3.9 MIL/uL (ref 3.87–5.11)

## 2011-10-21 IMAGING — RF DG UGI W/ GASTROGRAFIN
15 of 24 series · 15 of 24 positions shown · non-contrast
Comparison: None.

CLINICAL DATA: Postop gastric bypass.

ESOPHAGUS WITH WATER SOLUTION CONTRAST

[Series 1: run · 1 of 1 slices shown (1 of 15)]
[im 1/1]
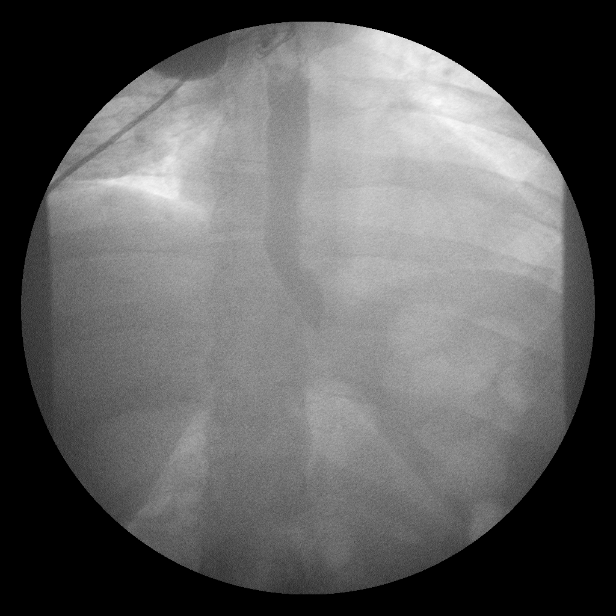

[Series 3: run · 1 of 1 slices shown (2 of 15)]
[im 1/1]
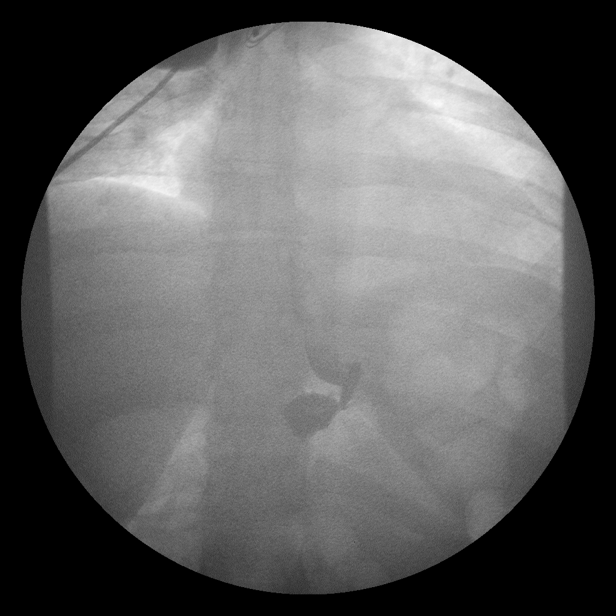

[Series 5: run · 1 of 1 slices shown (3 of 15)]
[im 1/1]
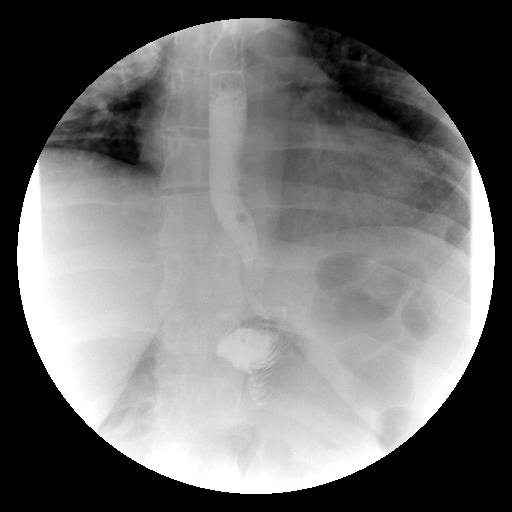

[Series 6: run · 1 of 1 slices shown (4 of 15)]
[im 1/1]
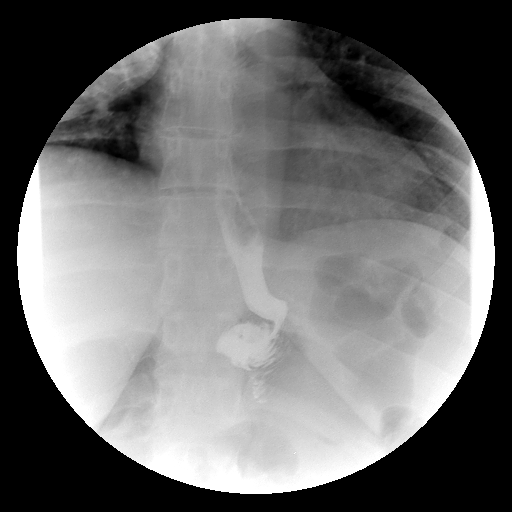

[Series 8: run · 1 of 1 slices shown (5 of 15)]
[im 1/1]
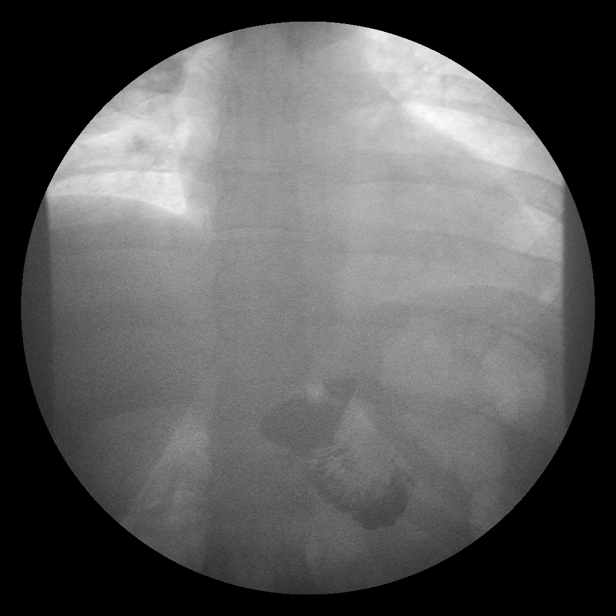

[Series 9: run · 1 of 1 slices shown (6 of 15)]
[im 1/1]
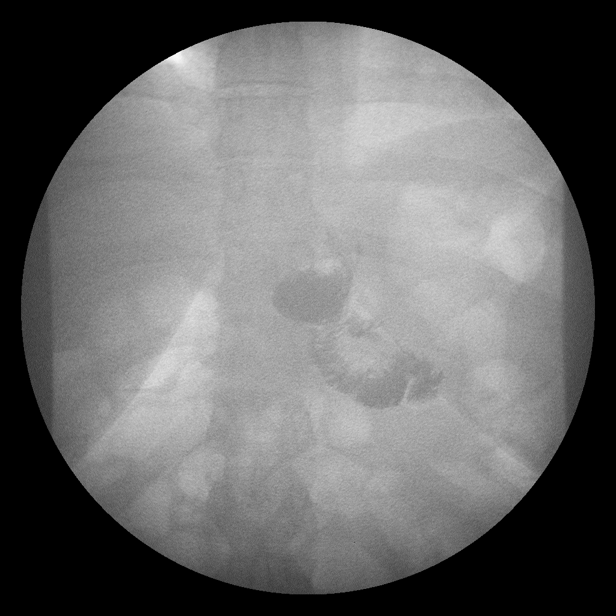

[Series 11: run · 1 of 1 slices shown (7 of 15)]
[im 1/1]
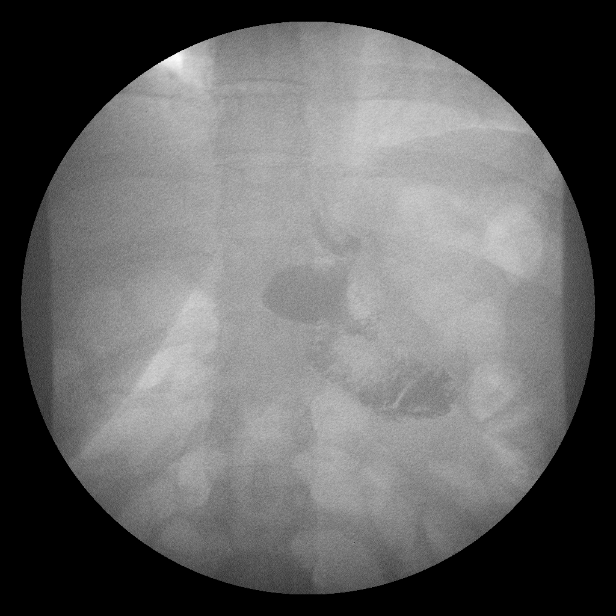

[Series 13: run · 1 of 1 slices shown (8 of 15)]
[im 1/1]
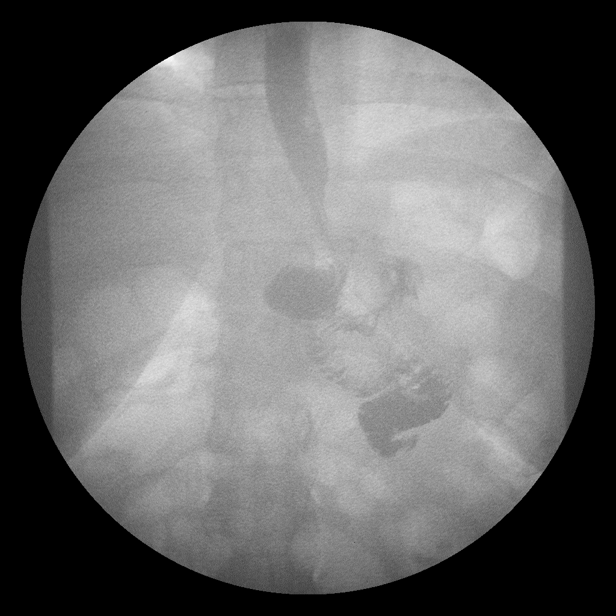

[Series 14: run · 1 of 1 slices shown (9 of 15)]
[im 1/1]
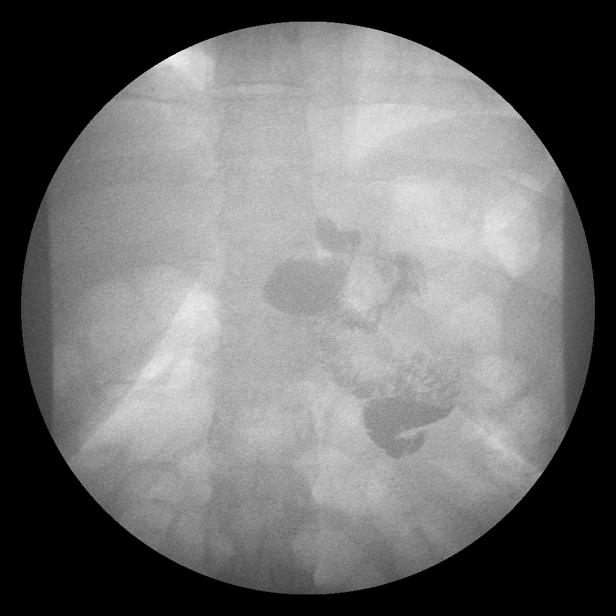

[Series 16: run · 1 of 1 slices shown (10 of 15)]
[im 1/1]
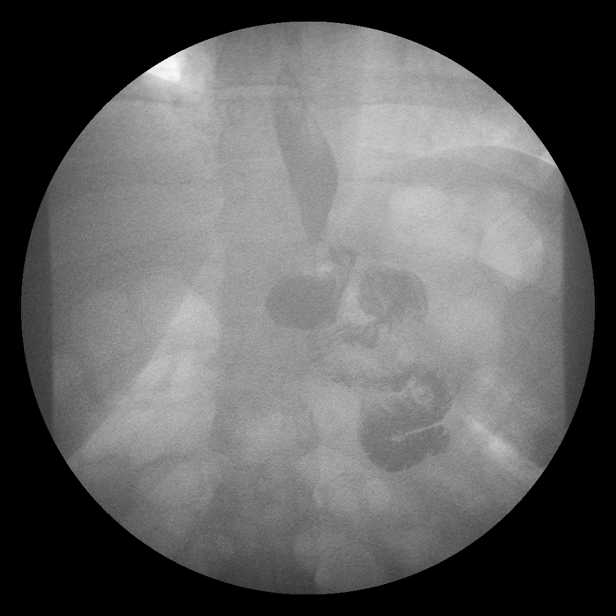

[Series 17: run · 1 of 1 slices shown (11 of 15)]
[im 1/1]
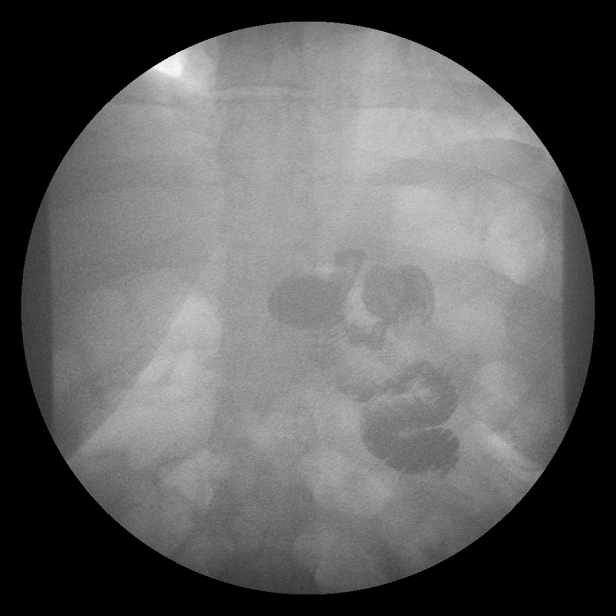

[Series 19: run · 1 of 1 slices shown (12 of 15)]
[im 1/1]
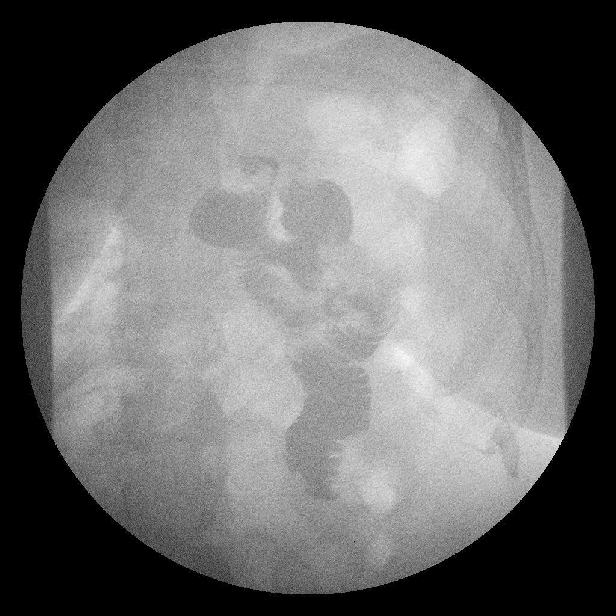

[Series 21: run · 1 of 1 slices shown (13 of 15)]
[im 1/1]
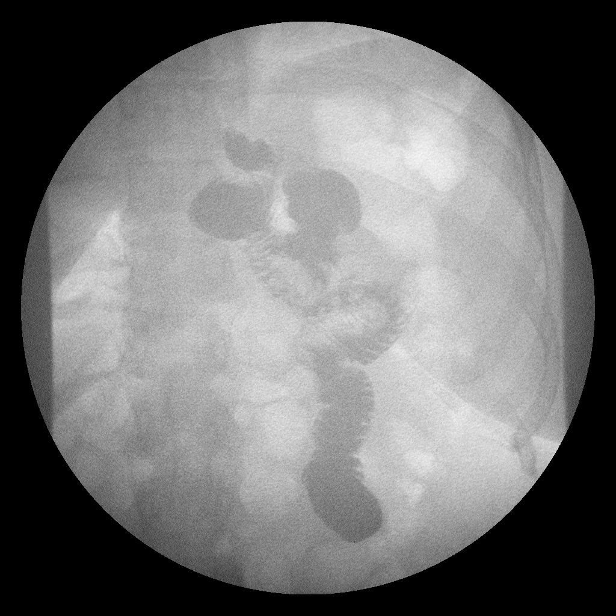

[Series 22: run · 1 of 1 slices shown (14 of 15)]
[im 1/1]
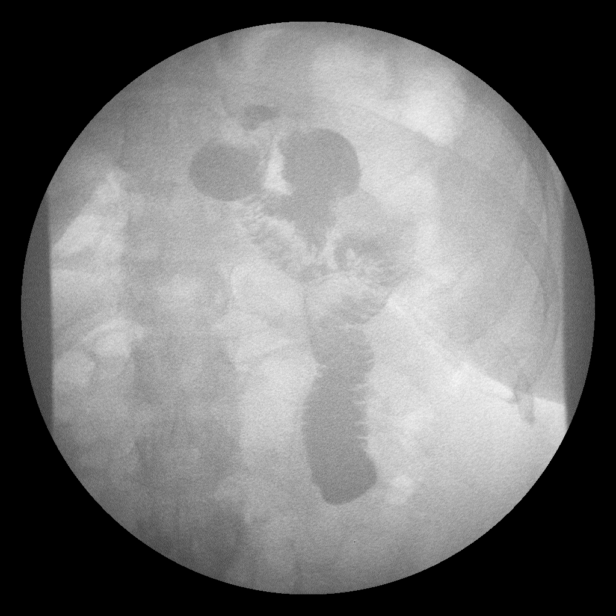

[Series 24: run · 1 of 1 slices shown (15 of 15)]
[im 1/1]
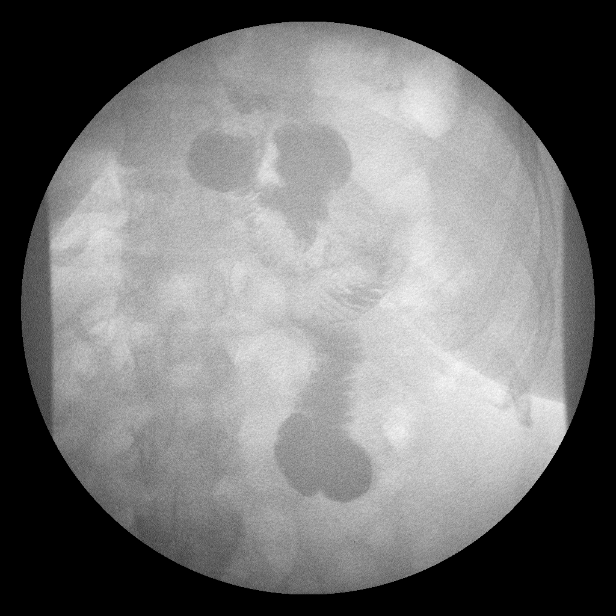

[15 of 24 positions shown; findings below may reference images not displayed]

FINDINGS: Fluoroscopic evaluation of swallowing shows a small
gastric remnant.  There is prompt emptying through the
gastrojejunostomy.  No contrast extravasation or leak.  No evidence
of obstruction of the distal anastomosis.  No leak.
IMPRESSION: Normal and expected postoperative appearance for gastric bypass.
No obstruction or leak.

## 2011-10-21 MED ORDER — ACETAMINOPHEN 10 MG/ML IV SOLN: 1000.0000 mg | Freq: Four times a day (QID) | INTRAVENOUS | Status: DC

## 2011-10-21 MED ORDER — ACETAMINOPHEN 10 MG/ML IV SOLN
1000.0000 mg | Freq: Four times a day (QID) | INTRAVENOUS | Status: DC
  Administered 2011-10-21 – 2011-10-22 (×3): 1000 mg via INTRAVENOUS
  Filled 2011-10-21 (×3): qty 100

## 2011-10-21 MED ORDER — IOHEXOL 300 MG/ML  SOLN: 50.0000 mL | Freq: Once | INTRAMUSCULAR | Status: AC | PRN

## 2011-10-21 NOTE — Progress Notes (Signed)
Patient ID: Belinda [name], female   DOB: [birth year], 36 y.o.   MRN: [id number] 1 Day Post-Op  Subjective: Some nausea this morning but improving with meds. Pain is adequately controlled without narcotics. She has been up and ambulatory.  Objective: Vital signs in last 24 hours: Temp:  [97.5 F (36.4 C)-99.3 F (37.4 C)] 97.9 F (36.6 C) (01/15 1000) Pulse Rate:  [83-97] 91  (01/15 1000) Resp:  [10-20] 18  (01/15 1000) BP: (104-131)/(50-89) 121/89 mmHg (01/15 1000) SpO2:  [96 %-100 %] 96 % (01/15 1000) Weight:  [245 lb 6 oz (111.301 kg)] 245 lb 6 oz (111.301 kg) (01/14 1818) Last BM Date: 10/20/11  Intake/Output from previous day: 01/14 0701 - 01/15 0700 In: 4193 [I.V.:4193] Out: 2025 [Urine:1975; Blood:50] Intake/Output this shift: Total I/O In: -  Out: 1100 [Urine:1100]  General appearance: alert and no distress GI: abnormal findings:  moderate tenderness in the epigastrium Incision/Wound:clean and dry  Lab Results:   Basename 10/21/11 0350 10/20/11 2025  WBC 11.0* --  HGB 12.4 12.6  HCT 36.2 38.1  PLT 212 --   BMET No results found for this basename: NA:2,K:2,CL:2,CO2:2,GLUCOSE:2,BUN:2,CREATININE:2,CALCIUM:2 in the last 72 hours PT/INR No results found for this basename: LABPROT:2,INR:2 in the last 72 hours ABG No results found for this basename: PHART:2,PCO2:2,PO2:2,HCO3:2 in the last 72 hours  Studies/Results: Dg Ugi W/water Sol Cm  10/21/2011  *RADIOLOGY REPORT*  Clinical Data: Postop gastric bypass.  ESOPHAGUS WITH WATER SOLUTION CONTRAST  Comparison: None.  Findings: Fluoroscopic evaluation of swallowing shows a small gastric remnant.  There is prompt emptying through the gastrojejunostomy.  No contrast extravasation or leak.  No evidence of obstruction of the distal anastomosis.  No leak.  IMPRESSION: Normal and expected postoperative appearance for gastric bypass. No obstruction or leak.  Original Report Authenticated By: Cyndie Chime, M.D.     Anti-infectives: Anti-infectives     Start     Dose/Rate Route Frequency Ordered Stop   10/20/11 0945   cefOXitin (MEFOXIN) 2 g in dextrose 5 % 50 mL IVPB        2 g 100 mL/hr over 30 Minutes Intravenous 60 min pre-op 10/20/11 0942 10/20/11 1315          Assessment/Plan: s/p Procedure(s): LAPAROSCOPIC ROUX-EN-Y GASTRIC Doing well postoperatively without evidence of complication. Will start postop day #1 diet. Continue pulmonary toilet and increase activity.   LOS: 1 day    Belinda [name] T 10/21/2011

## 2011-10-21 NOTE — Progress Notes (Signed)
Spoke with patient in regard to cpap. Pt refused to wear it tonight. Pt has been unable to tolerate it.  Advised pt that RT available all night should she change her mind to let her nurse nurse. RN notified.

## 2011-10-21 NOTE — Progress Notes (Signed)
Pt alert and oriented; sitting up in chair at bedside; VSS; pt c/o nausea but states relief with prn meds; doppler study normal; awaiting swallow study; verbalized relief with prn pain meds as well; voiding without difficulty; denies flatus at this time; ambulating without difficulty; using incentive spirometer; discharge instructions given for pt to review and will complete tomorrow; SOP at bedside; plan of care discussed with the pt and pt verbalized understanding of.  GASTRIC BYPASS/SLEEVE DISCHARGE INSTRUCTIONS  Drs. Fredrik Rigger, Hoxworth, Wilson, and Linwood Call if you have any problems.   Call [phone] and ask for the surgeon on call.    If you need immediate assistance come to the ER at [facility]. Tell the ER personnel that you are a new post-op gastric bypass patient. Signs and symptoms to report:   Severe vomiting or nausea. If you cannot tolerate clear liquids for longer than 1 day, you need to call your surgeon.    Abdominal pain which does not get better after taking your pain medication   Fever greater than 101 F degree   Difficulty breathing   Chest pain    Redness, swelling, drainage, or foul odor at incision sites    If your incisions open or pull apart   Swelling or pain in calf (lower leg)   Diarrhea, frequent watery, uncontrolled bowel movements.   Constipation, (no bowel movements for 3 days) if this occurs, Take Milk of Magnesia, 2 tablespoons by mouth, 3 times a day for 2 days if needed.  Call your doctor if constipation continues. Stop taking Milk of Magnesia once you have had a bowel movement. You may also use Miralax according to the label instructions.   Anything you consider "abnormal for you".   Normal side effects after Surgery:   Unable to sleep at night or concentrate   Irritability   Being tearful (crying) or depressed   These are common complaints, possibly related to your anesthesia, stress of surgery and change in lifestyle, that usually go away a  few weeks after surgery.  If these feelings continue, call your medical doctor.  Wound Care You may have surgical glue, steri-strips, or staples over your incisions after surgery.  Surgical glue:  Looks like a clear film over your incisions and will wear off gradually. Steri-strips: Strips of tape over your incisions. You may notice a yellowish color on the skin underneath the steri-strips. This is a substance used to make the steri-strips stick better. Do not pull the steri-strips off - let them fall off.  Staples: Cherlynn Polo may be removed before you leave the hospital. If you go home with staples, call [facility] [phone]) for an appointment with your surgeon's nurse to have staples removed in 7 - 10 days. Showering: You may shower two days after your surgery unless otherwise instructed by your surgeon. Wash gently around wounds with warm soapy water, rinse well, and gently pat dry.  If you have a drain, you may need someone to hold this while you shower. Avoid tub baths until staples are removed and incisions are healed.    Medications   Medications should be liquid or crushed if larger than the size of a dime.  Extended release pills should not be crushed.   Depending on the size and number of medications you take, you may need to stagger/change the time you take your medications so that you do not over-fill your pouch.    Make sure you follow-up with your primary care physician to make  medication adjustments needed during rapid weight loss and life-style adjustment.   If you are diabetic, follow up with the doctor that prescribes your diabetes medication(s) within one week after surgery and check your blood sugar regularly.   Do not drive while taking narcotics!   Do not take acetaminophen (Tylenol) and Roxicet or Lortab Elixir at the same time since these pain medications contain acetaminophen.  Diet at home: (First 2 Weeks) You will see the nutritionist two weeks after your  surgery. She will advance your diet if you are tolerating liquids well. Once at home, if you have severe vomiting or nausea and cannot tolerate clear liquids lasting longer than 1 day, call your surgeon.  Begin high protein shake 2 ounces every 3 hours, 5 - 6 times per day.  Gradually increase the amount you drink as tolerated.  You may find it easier to slowly sip shakes throughout the day.  It is important to get your proteins in first.   Protein Shake   Drink at least 2 ounces of shake 5-6 times per day   Each serving of protein shakes should have a minimum of 15 grams of protein and no more than 5 grams of carbohydrate    Increase the amount of protein shake you drink as tolerated   Protein powder may be added to fluids such as non-fat milk or Lactaid milk (limit to 20 grams added protein powder per serving   The initial goal is to drink at least 8 ounces of protein shake/drink per day (or as directed by the nutritionist). Some examples of protein shakes are [employer], [employer], EAS Edge HP, and [employer]jury. Hydration   Gradually increase the amount of water and other liquids as tolerated (See Acceptable Fluids)   Gradually increase the amount of protein shake as tolerated     Sip fluids slowly and throughout the day   May use Sugar substitutes, use sparingly (limit to 6 - 8 packets per day). Your fluid goal is 64 ounces of fluid daily. It may take a few weeks to build up to this.         32 oz (or more) should be clear liquids and 32 oz (or more) should be full liquids.         Liquids should not contain sugar, caffeine, or carbonation! Acceptable Fluids Clear Liquids:   Water or Sugar-free flavored water, Fruit H2O   Decaffeinated coffee or tea (sugar-free)   Crystal Lite, Wyler's Lite, Minute Maid Lite   Sugar-free Jell-O   Bouillon or broth   Sugar-free Popsicle:   *Less than 20 calories each; Limit 1 per day   Full Liquids:              Protein Shakes/Drinks + 2 choices  per day of other full liquids shown below.    Other full liquids must be: No more than 12 grams of Carbs per serving,  No more than 3 grams of Fat per serving   Strained low-fat cream soup   Non-Fat milk   Fat-free Lactaid Milk   Sugar-free yogurt (Dannon Lite & Fit) Vitamins and Minerals (Start 1 day after surgery unless otherwise directed)   2 Chewable Multivitamin / Multimineral Supplement (i.e. Centrum for Adults)   Chewable Calcium Citrate with Vitamin D-3. Take 1500 mg each day.           (Example: 3 Chewable Calcium Plus 600 with Vitamin D-3 can be found at [facility])  Vitamin B-12, 350 - 500 micrograms (oral tablet) each day   Do not mix multivitamins containing iron with calcium supplements; take 2 hours   apart   Do not substitute Tums (calcium carbonate) for your calcium   Menstruating women and those at risk for anemia may need extra iron. Talk with your doctor to see if you need additional iron.    If you need extra iron:  Total daily Iron recommendations (including Vitamins) = 50 - 100 mg Iron/day Do not stop taking or change any vitamins or minerals until you talk to your nutritionist or surgeon. Your nutritionist and / or physician must approve all vitamin and mineral supplements. Exercise For maximum success, begin exercising as soon as your doctor recommends. Make sure your physician approves any physical activity.   Depending on fitness level, begin with a simple walking program   Walk 5-15 minutes each day, 7 days per week.    Slowly increase until you are walking 30-45 minutes per day   Consider joining our BELT program. [phone] or email belt@[employer] .edu Things to remember:    You may have sexual relations when you feel comfortable. It is VERY important for female patients to use a reliable birth control method. Fertility often increases after surgery. Do not get pregnant for at least 18 months.   It is very important to keep all follow up appointments with your  surgeon, nutritionist, primary care physician, and behavioral health practitioner. After the first year, please follow up with your bariatric surgeon at least once a year in order to maintain best weight loss results.  [facility]: [phone] [facility]: [phone]   Free counseling is available for you and your family through collaboration between [facility] and [place]. Please call [phone] and leave a message.    Consider purchasing a medical alert bracelet that says you had gastric bypass surgery.    [facility] has a free Bariatric Surgery Support Group that meets monthly, the 3rd Thursday, 6 pm, Classroom #1, [place]. You may register online at [email or web address], but registration is not necessary. Select Classes and Support Groups, Bariatric Surgery, or Call [phone]   Do not return to work or drive until cleared by your surgeon   Use your CPAP when sleeping if applicable   Do not lift anything greater than ten pounds for at least two weeks  Talmadge Chad, RN Bariatric Nurse Coordinator

## 2011-10-21 NOTE — Progress Notes (Signed)
VASCULAR LAB PRELIMINARY  PRELIMINARY  PRELIMINARY  PRELIMINARY  Bilateral lower extremity venous duplex  completed.    Preliminary report:  Bilateral:  No evidence of DVT, superficial thrombosis, or Baker's Cyst.    Terance Hart, RVT 10/21/2011, 8:50 AM

## 2011-10-22 LAB — CBC
MCH: 31 pg (ref 26.0–34.0)
MCHC: 33.1 g/dL (ref 30.0–36.0)
MCV: 93.7 fL (ref 78.0–100.0)
Platelets: 196 10*3/uL (ref 150–400)
RBC: 3.78 MIL/uL — ABNORMAL LOW (ref 3.87–5.11)

## 2011-10-22 LAB — DIFFERENTIAL
Basophils Relative: 0 % (ref 0–1)
Eosinophils Absolute: 0 10*3/uL (ref 0.0–0.7)
Eosinophils Relative: 0 % (ref 0–5)
Lymphs Abs: 3.5 10*3/uL (ref 0.7–4.0)
Monocytes Relative: 8 % (ref 3–12)

## 2011-10-22 MED ORDER — BUPROPION HCL ER (XL) 300 MG PO TB24
450.0000 mg | ORAL_TABLET | Freq: Every day | ORAL | Status: DC
  Administered 2011-10-22: 450 mg via ORAL
  Filled 2011-10-22: qty 1

## 2011-10-22 MED ORDER — DIVALPROEX SODIUM ER 500 MG PO TB24
500.0000 mg | ORAL_TABLET | Freq: Every day | ORAL | Status: DC
  Administered 2011-10-22: 500 mg via ORAL
  Filled 2011-10-22: qty 1

## 2011-10-22 MED ORDER — LAMOTRIGINE 150 MG PO TABS
150.0000 mg | ORAL_TABLET | Freq: Every day | ORAL | Status: DC
  Filled 2011-10-22: qty 1

## 2011-10-22 MED ORDER — TRAMADOL HCL 50 MG PO TABS
50.0000 mg | ORAL_TABLET | Freq: Four times a day (QID) | ORAL | Status: AC | PRN
Start: 2011-10-22 — End: 2011-11-01

## 2011-10-22 NOTE — Progress Notes (Signed)
Pt alert and oriented; VSS; states she feels much better today; denies nausea or vomiting; tolerating water well; will begin protein shakes this am and if tolerates well, will discharge per MD order; voiding well; + flatus; no BM yet; ambulating well; pt verbalized pain relieved by prn pain meds; discharge instructions reviewed with pt and SOP and they verbalized understanding of; pt already has follow up appts with [facility] and [facility]. Talmadge Chad, RN Bariatric Nurse Coordinator

## 2011-10-22 NOTE — Progress Notes (Signed)
Pt taken out via wheelchair by Ma[place], NT.  Family to transport home.

## 2011-10-22 NOTE — Discharge Summary (Signed)
   Patient ID: Belinda [name] [id number] 36 y.o. 1976-10-04  10/20/2011  Discharge date and time: @T @  Admitting Physician: Glenna Fellows T  Discharge Physician: Glenna Fellows T  Admission Diagnoses: morbid obesity   Discharge Diagnoses: same  Operations: Procedure(s): LAPAROSCOPIC ROUX-EN-Y GASTRIC  Admission Condition: good  Discharged Condition: good  Indication for Admission: morbid obesity  Hospital Course: patient is a 36 year old female with progressive morbid obesity unresponsive to medical management. She is electively admitted for laparoscopic Roux-en-Y gastric bypass. She underwent uneventful laparoscopic bypass on the morning of admission. On the first postoperative day her pain was reasonably well controlled without narcotics by her preference. Gastrografin swallow showed no evidence of leak or obstruction and she was started on water and ice by mouth. Hemoglobin remained stable. On the second postoperative day she was feeling much better with minimal pain. Vital signs were stable. White count and hemoglobin were normal. She was started on protein shakes which he tolerated. Abdomen is benign and wounds healing well. Is felt ready for discharge at this time.  Consults: none  Significant Diagnostic Studies: as above   Disposition: Home  Patient Instructions:   [name], [name]  Home Medication Instructions JYN:[id number]   Printed on:10/22/11 0857  Medication Information                    lamoTRIgine (LAMICTAL) 100 MG tablet Take 150 mg by mouth at bedtime.            BuPROPion HCl (WELLBUTRIN XL PO) Take 450 mg by mouth at bedtime.            divalproex (DEPAKOTE ER) 500 MG 24 hr tablet Take 1,500 mg by mouth at bedtime.            norgestimate-ethinyl estradiol (ORTHO-CYCLEN,SPRINTEC,PREVIFEM) 0.25-35 MG-MCG tablet Take 1 tablet by mouth daily.            BuPROPion HCl (WELLBUTRIN XL PO)            traMADol (ULTRAM) 50 MG tablet Take 1-2  tablets (50-100 mg total) by mouth every 6 (six) hours as needed for pain.             Activity: activity as tolerated Diet:bariatric shape Wound Care: none needed  Follow-up:  With Dr Johna Sheriff in 1 week.  Signed: Mariella Saa MD, FACS  10/22/2011, 8:57 AM

## 2011-10-22 NOTE — Progress Notes (Signed)
Discharge instructions given.  Called pharmacy to verify medicine orders received.  Pt verbalizes appt. Set for dr follow-up and nutrition.  Pt verbalizes understanding of diet.  Discharge papers given and pt verbalizes understanding.

## 2011-10-22 NOTE — Progress Notes (Signed)
Patient ID: Livie Vanderhoof, female   DOB: [birth year], 36 y.o.   MRN: [id number] 2 Days Post-Op  Subjective: Feels much better. Only complaints currently is headache which she feels is because she has not taken her Depakote. No double pain.  Objective: Vital signs in last 24 hours: Temp:  [97.9 F (36.6 C)-98.5 F (36.9 C)] 98 F (36.7 C) (01/16 0501) Pulse Rate:  [80-100] 96  (01/16 0501) Resp:  [18-22] 22  (01/16 0501) BP: (117-145)/(77-89) 117/77 mmHg (01/16 0501) SpO2:  [93 %-96 %] 93 % (01/16 0501) Last BM Date: 10/20/11  Intake/Output from previous day: 01/15 0701 - 01/16 0700 In: 2943.3 [P.O.:60; I.V.:2883.3] Out: 3675 [Urine:3675] Intake/Output this shift:    General appearance: alert and no distress GI: normal findings: soft, non-tender Incision/Wound:clean and dry  Lab Results:   Basename 10/22/11 0355 10/21/11 1736 10/21/11 0350  WBC 9.5 -- 11.0*  HGB 11.7* 12.4 --  HCT 35.4* 36.9 --  PLT 196 -- 212   BMET No results found for this basename: NA:2,K:2,CL:2,CO2:2,GLUCOSE:2,BUN:2,CREATININE:2,CALCIUM:2 in the last 72 hours PT/INR No results found for this basename: LABPROT:2,INR:2 in the last 72 hours ABG No results found for this basename: PHART:2,PCO2:2,PO2:2,HCO3:2 in the last 72 hours  Studies/Results: Dg Ugi W/water Sol Cm  10/21/2011  *RADIOLOGY REPORT*  Clinical Data: Postop gastric bypass.  ESOPHAGUS WITH WATER SOLUTION CONTRAST  Comparison: None.  Findings: Fluoroscopic evaluation of swallowing shows a small gastric remnant.  There is prompt emptying through the gastrojejunostomy.  No contrast extravasation or leak.  No evidence of obstruction of the distal anastomosis.  No leak.  IMPRESSION: Normal and expected postoperative appearance for gastric bypass. No obstruction or leak.  Original Report Authenticated By: Cyndie Chime, M.D.    Anti-infectives: Anti-infectives     Start     Dose/Rate Route Frequency Ordered Stop   10/20/11 0945   cefOXitin  (MEFOXIN) 2 g in dextrose 5 % 50 mL IVPB        2 g 100 mL/hr over 30 Minutes Intravenous 60 min pre-op 10/20/11 0942 10/20/11 1315          Assessment/Plan: s/p Procedure(s): LAPAROSCOPIC ROUX-EN-Y GASTRIC Doing well without complications identified. Start postop day 2 diet. Resume home meds. Okay for discharge today if protein shakes tolerated.   LOS: 2 days    Deyjah Kindel T 10/22/2011

## 2011-10-23 ENCOUNTER — Telehealth (INDEPENDENT_AMBULATORY_CARE_PROVIDER_SITE_OTHER): Payer: Self-pay

## 2011-10-23 ENCOUNTER — Encounter (HOSPITAL_COMMUNITY): Payer: Self-pay | Admitting: General Surgery

## 2011-10-23 NOTE — Telephone Encounter (Signed)
C/o watery diarrhea- three times in the past 24hrs- liquid stool- flatus- no fever- taking Zofran for nausea.  Last meal Jello, 8 oz fluid, and Pedialyte.  Patient wants to know if this is normal?  What can she take for the diarrhea?   Surgery 10/20/2011 Laparoscopic Roux-en-Y gastric bypass -- Per Dr. Donell Beers - take Imodium 1 pill- twice daily - if condition does not improve or worsen in 24hrs call back. Patient aware.

## 2011-10-25 ENCOUNTER — Telehealth (INDEPENDENT_AMBULATORY_CARE_PROVIDER_SITE_OTHER): Payer: Self-pay | Admitting: General Surgery

## 2011-10-25 NOTE — Telephone Encounter (Signed)
She called on postop day #5 after having a laparoscopic gastric bypass by Dr. Johna Sheriff.  On her second postoperative day she developed some diarrhea and was told by her office to take one Imodium every 12 hours.  That helped initially but she still having some problems with diarrhea. She stayed on a liquid diet as well as Jell-O with increased protein. No fever or chills. I told her likely this is more related to surgery rather than her getting a colitis. I advised her to stay with a liquid diet but stopped eating the Jell-O. I told her to take 1 Imodium after each loose bowel movement up to 6 a day.  I told her this continued we need to see her in the office and order a stool study.

## 2011-11-04 ENCOUNTER — Telehealth (INDEPENDENT_AMBULATORY_CARE_PROVIDER_SITE_OTHER): Payer: Self-pay

## 2011-11-04 ENCOUNTER — Encounter: Payer: Managed Care, Other (non HMO) | Attending: General Surgery | Admitting: *Deleted

## 2011-11-04 DIAGNOSIS — Z01818 Encounter for other preprocedural examination: Secondary | ICD-10-CM | POA: Insufficient documentation

## 2011-11-04 DIAGNOSIS — Z713 Dietary counseling and surveillance: Secondary | ICD-10-CM | POA: Insufficient documentation

## 2011-11-04 NOTE — Progress Notes (Signed)
  Bariatric Class:  Appt start time: 1600 end time:  1700.  2 Week Post-Operative Nutrition Class  Patient was seen on 11/04/2011 for Post-Operative Nutrition education at [facility].   Surgery date: 10/20/11  Surgery type: Gastric Bypass   Weight today: 233.4 lbs Weight change: 20.8 lbs BMI: 41.4  The following the learning objective met the patient during this course:   Identifies Phase 3A (Soft, High Proteins) Dietary Goals and will begin from 2 weeks post-operatively to 2 months post-operatively   Identifies appropriate sources of fluids and proteins   States protein recommendations and appropriate sources post-operatively  Identifies the need for appropriate texture modifications, mastication, and bite sizes when consuming solids  Identifies appropriate multivitamin and calcium sources post-operatively  Describes the need for physical activity post-operatively and will follow MD recommendations  States when to call healthcare provider regarding medication questions or post-operative complications  Handouts given during class include:  Phase 3A: Soft, High Protein Diet Handout  Band Fill Guidelines Handout  Follow-Up Plan: Patient will follow-up at [facility] in 6 weeks for 2 months post-op nutrition visit for diet advancement per MD.

## 2011-11-04 NOTE — Patient Instructions (Signed)
Patient to follow Phase 3A-Soft, High Protein Diet and follow-up at [facility] in 6 weeks for 2 months post-op nutrition visit for diet advancement. 

## 2011-11-04 NOTE — Telephone Encounter (Signed)
Spoke with Belinda [name] to r/s appt to 11/14/11 w/Dr. Johna Sheriff @ 4:45

## 2011-11-06 ENCOUNTER — Ambulatory Visit (INDEPENDENT_AMBULATORY_CARE_PROVIDER_SITE_OTHER): Payer: Managed Care, Other (non HMO) | Admitting: General Surgery

## 2011-11-14 ENCOUNTER — Encounter (INDEPENDENT_AMBULATORY_CARE_PROVIDER_SITE_OTHER): Payer: Self-pay | Admitting: General Surgery

## 2011-11-14 ENCOUNTER — Ambulatory Visit (INDEPENDENT_AMBULATORY_CARE_PROVIDER_SITE_OTHER): Payer: Managed Care, Other (non HMO) | Admitting: General Surgery

## 2011-11-14 VITALS — BP 126/80 | HR 68 | Temp 97.6°F | Resp 16 | Ht 62.0 in | Wt 225.8 lb

## 2011-11-14 DIAGNOSIS — R110 Nausea: Secondary | ICD-10-CM

## 2011-11-14 LAB — CBC WITH DIFFERENTIAL/PLATELET
Basophils Absolute: 0 10*3/uL (ref 0.0–0.1)
Eosinophils Absolute: 0.1 10*3/uL (ref 0.0–0.7)
Eosinophils Relative: 2 % (ref 0–5)
MCH: 30.9 pg (ref 26.0–34.0)
MCV: 95.7 fL (ref 78.0–100.0)
Platelets: 229 10*3/uL (ref 150–400)
RDW: 13 % (ref 11.5–15.5)
WBC: 8.7 10*3/uL (ref 4.0–10.5)

## 2011-11-14 NOTE — Progress Notes (Signed)
History: The patient returns 3 weeks following laparoscopic Roux-en-Y gastric bypass. She is having difficulty with her diet. She was not able to tolerate the protein shakes due to pain and nausea.She had an entrance by the nutritionist to soft protein but essentially anything she tries to eat gives her nausea and she is unable to get much at all down. She feels like she is struggling somewhat even to stay hydrated and she takes a lot of medications which fill her up. She has been feeling a little weak and dizzy. No palpable pain or fever.  Exam: BP 126/80  Pulse 68  Temp(Src) 97.6 F (36.4 C) (Temporal)  Resp 16  Ht 5\' 2"  (1.575 m)  Wt 225 lb 12.8 oz (102.422 kg)  BMI 41.30 kg/m2  Gen.: She is alert and does not appear ill. Abdomen: Soft and nontender. Wounds healing well.  Assessment and plan: I doubt there is a specific complications but rather some persistent ileus and slow emptying. I gave her some samples of a different type of bariatric shake to try. Sure to keep herself hydrated. We will check a CBC and be met. I think likely this will improve in the short term. If she is feeling any worse she is to call immediately otherwise return in 3 weeks.

## 2011-11-14 NOTE — Patient Instructions (Signed)
Tried to sip fluids continuously throughout the day. Call if feeling any worse.

## 2011-11-15 LAB — COMPREHENSIVE METABOLIC PANEL
ALT: 24 U/L (ref 0–35)
AST: 28 U/L (ref 0–37)
Alkaline Phosphatase: 53 U/L (ref 39–117)
Creat: 0.88 mg/dL (ref 0.50–1.10)
Total Bilirubin: 0.3 mg/dL (ref 0.3–1.2)

## 2011-11-18 ENCOUNTER — Telehealth (INDEPENDENT_AMBULATORY_CARE_PROVIDER_SITE_OTHER): Payer: Self-pay

## 2011-11-18 NOTE — Telephone Encounter (Signed)
Lab results given to patient.

## 2011-11-18 NOTE — Telephone Encounter (Signed)
Left message for patient to call our office.

## 2011-12-05 ENCOUNTER — Ambulatory Visit (INDEPENDENT_AMBULATORY_CARE_PROVIDER_SITE_OTHER): Payer: Managed Care, Other (non HMO) | Admitting: General Surgery

## 2011-12-05 ENCOUNTER — Encounter (INDEPENDENT_AMBULATORY_CARE_PROVIDER_SITE_OTHER): Payer: Self-pay | Admitting: General Surgery

## 2011-12-05 VITALS — BP 136/92 | HR 70 | Temp 97.9°F | Resp 18 | Ht 62.0 in | Wt 215.4 lb

## 2011-12-05 DIAGNOSIS — Z09 Encounter for follow-up examination after completed treatment for conditions other than malignant neoplasm: Secondary | ICD-10-CM

## 2011-12-05 MED ORDER — ONDANSETRON HCL 4 MG PO TABS: 4.0000 mg | ORAL_TABLET | Freq: Three times a day (TID) | ORAL | Status: DC | PRN

## 2011-12-05 NOTE — Progress Notes (Signed)
History: Patient returns now 6 weeks following laparoscopic Roux-en-Y gastric bypass. She's had some continued difficulty with nausea and inability to take solid food. She states however over the last 3 weeks but this has gotten a little bit better. She is found something she can be such as yogurt and muscle milk and is getting a little more protein in it feeling less nausea. She sometimes just gags and gets nauseated before she swallows because of the paced of the food and lack of appetite. She does not feel ill overall it like to begin an exercise program. She is no longer feeling dizzy. Lab work at her last visit was unremarkable.  Exam: General: She does not appear ill Abdomen: Soft and nontender. He  Assessment and plan: Status post Roux-en-Y gastric bypass. She's having some difficulty with oral intake but no evidence of obstruction or severe complication. She seems to be gradually improving. Exam and lab work were unremarkable. We'll continue to monitor this closely. She will call for any concerns otherwise return in 6 weeks.

## 2011-12-09 ENCOUNTER — Telehealth (INDEPENDENT_AMBULATORY_CARE_PROVIDER_SITE_OTHER): Payer: Self-pay | Admitting: General Surgery

## 2011-12-09 NOTE — Telephone Encounter (Signed)
The patient contacted the office stating she is still experiencing weakness and nausea, would like to know what she needs to do

## 2011-12-10 ENCOUNTER — Telehealth (INDEPENDENT_AMBULATORY_CARE_PROVIDER_SITE_OTHER): Payer: Self-pay | Admitting: General Surgery

## 2011-12-10 NOTE — Telephone Encounter (Signed)
Spoke with the patient and she states she is feeling better today, she will call and schedule if her symptoms re appear.

## 2011-12-10 NOTE — Telephone Encounter (Signed)
Message copied by Latricia Heft on Wed Dec 10, 2011  1:21 PM ------      Message from: Glenna Fellows T      Created: Tue Dec 09, 2011  8:05 PM       Re call.  Just saw pt 4 days ago.  If she is worse she needs to be seen in office, otherwise instructions as per recent office visit

## 2011-12-16 ENCOUNTER — Ambulatory Visit: Payer: Managed Care, Other (non HMO) | Admitting: *Deleted

## 2011-12-22 ENCOUNTER — Encounter: Payer: Self-pay | Admitting: *Deleted

## 2011-12-22 ENCOUNTER — Encounter: Payer: Managed Care, Other (non HMO) | Attending: General Surgery | Admitting: *Deleted

## 2011-12-22 DIAGNOSIS — Z01818 Encounter for other preprocedural examination: Secondary | ICD-10-CM | POA: Insufficient documentation

## 2011-12-22 DIAGNOSIS — Z713 Dietary counseling and surveillance: Secondary | ICD-10-CM | POA: Insufficient documentation

## 2011-12-22 NOTE — Patient Instructions (Signed)
Goals:  Follow Phase 3B: High Protein + Non-Starchy Vegetables  Eat 3-6 small meals/snacks, every 3-5 hrs  Increase lean protein foods to meet 60g goal  Increase fluid intake to 64oz +  Avoid drinking 15 minutes before, during and 30 minutes after eating  Aim for >30 min of physical activity daily - Contact B[facility] Program.

## 2011-12-22 NOTE — Progress Notes (Addendum)
Follow-up visit: 8 Weeks Post-Operative Gastric Bypass Surgery  Medical Nutrition Therapy:  Appt start time: 1130 end time:  1200.  Assessment:  Primary concerns today: post-operative bariatric surgery nutrition management. Belinda [name] is here today for 8 week f/u.  Reports doing better now, though has struggled with nausea until recently.  Pt is "not a huge fan of meat" and smell made her sick. She has only been able to drink diluted apple juice (no dumping reported) and crystal light, plus 1 shake or power bar daily. Also reports hypoglycemic episodes approximately every 2 weeks. BG stable otherwise. States interest in the B.E[facility] Program.   Surgery date: 10/20/11  Surgery type: Gastric Bypass  Start Weight at [facility]: 254.2 lbs  Weight today: 204.9 lbs Weight change: 28.5 lbs Total weight loss: 49.3 lbs BMI: 37.5 kg/m^2  24-hr recall:  B (AM): Regular sausage (1/3 of piece) Snk (AM): Protein shake (1/2)   L (PM): Tuna salad (1-1.5 oz) Snk (PM): Protein shake (1/2)  D (PM): Shredded chicken (1 oz) Snk (PM): None or more protein shake  Fluid intake: 30-35 oz Estimated total protein intake: 45-55 g  Medications: Changes include d/c of BCP, addition of Nuva-Ring, and dulcolax. Supplementation: Taking as directed with no problems.  CBG monitoring: daily Average CBG per patient: 80-116 mg Last patient reported A1c: 5.8%  Using straws: No Drinking while eating: No Hair loss: Yes; decreasing Carbonated beverages: No N/V/D/C: Regurgitation/Nausea - decreasing; Constipation - has added dulcolax Dumping syndrome: No  Recent physical activity:  None  Progress Towards Goal(s):  In progress.  Handouts given during visit include:  Phase III-B - High Protein + Non-starchy Vegetables handout   Samples Dispensed:   Unjury (unflavored) Lot # [id number] Exp: 02/2013  Nutritional Diagnosis:  [place]-3.3 Overweight/obesity related to recent gastric bypass surgery as evidenced by patient  following post-op nutritional guidelines in attempt to continue weight loss.    Intervention:  Nutrition education.  Monitoring/Evaluation:  Dietary intake, exercise, lap band fills, and body weight. Follow up in 1 months for 3 month post-op visit.

## 2011-12-23 ENCOUNTER — Encounter: Payer: Self-pay | Admitting: *Deleted

## 2011-12-27 ENCOUNTER — Encounter: Payer: Self-pay | Admitting: *Deleted

## 2011-12-27 NOTE — Progress Notes (Signed)
Patient emailed to report extreme nausea and gagging upon eating or drinking. Reports she was unable to eat or drink at all yesterday. Advised pt to contact surgeon and return for check up as soon as possible. Will continue to work with her on high protein options that do not cause regurgitation.

## 2011-12-27 NOTE — Progress Notes (Deleted)
Subjective:     Patient ID: Belinda [name], female   DOB: 10/08/1975, 36 y.o.   MRN: [id number]  HPI   Review of Systems     Objective:   Physical Exam     Assessment:     ***    Plan:     ***

## 2012-01-16 ENCOUNTER — Ambulatory Visit (INDEPENDENT_AMBULATORY_CARE_PROVIDER_SITE_OTHER): Payer: Managed Care, Other (non HMO) | Admitting: General Surgery

## 2012-01-20 ENCOUNTER — Encounter: Payer: Managed Care, Other (non HMO) | Attending: General Surgery | Admitting: *Deleted

## 2012-01-20 ENCOUNTER — Telehealth (INDEPENDENT_AMBULATORY_CARE_PROVIDER_SITE_OTHER): Payer: Self-pay | Admitting: General Surgery

## 2012-01-20 DIAGNOSIS — Z01818 Encounter for other preprocedural examination: Secondary | ICD-10-CM | POA: Insufficient documentation

## 2012-01-20 DIAGNOSIS — Z713 Dietary counseling and surveillance: Secondary | ICD-10-CM | POA: Insufficient documentation

## 2012-01-20 NOTE — Patient Instructions (Addendum)
Goals:  Continue following Phase 3B: High Protein + Non-Starchy Vegetables  Eat 3-6 small meals/snacks, every 3-5 hrs  May add 1/2 cup fruit to 1 meal per day.  Increase lean protein foods to meet 60g goal  Increase fluid intake to 64oz +  Avoid drinking 15 minutes before, during and 30 minutes after eating  Aim for >30 min of physical activity daily   May try adding Biotin for hair loss - Check with your doctor.

## 2012-01-20 NOTE — Progress Notes (Addendum)
Follow-up visit: 12 Weeks Post-Operative Gastric Bypass Surgery  Medical Nutrition Therapy:  Appt start time:  1200   End time:  1230.  Primary concerns today: post-operative bariatric surgery nutrition management. Belinda [name] returns today for 12 week f/u.  Reports doing better now, though has struggled with nausea until recently. Called B[facility] Program today and plans to join.   Surgery date: 10/20/11  Surgery type: Gastric Bypass  Start Weight at [facility]: 254.2 lbs Goal weight: 130 lbs  Weight today: 193.6 lbs Weight change: 11.3 lbs Total weight loss: 60.6 lbs BMI:  34.3 kg/m^2 % Weight goal met: 49%  24-hr recall: B (AM): 1/2 Balance Bar Snk (AM): n/a L (PM): 1/3 c beans Snk (PM): Steamed broccoli and dip D (PM): Green beans OR protein shake Snk (PM): None or more protein shake *Reports drinking a protein shake (28g) 3 times/week  Fluid intake: 50 oz Estimated total protein intake: 45-55 g  Medications: Changes include d/c of BCP; addition of Nuva-Ring and dulcolax. Supplementation: Taking as directed with no problems.  CBG monitoring: TID Average CBG per patient: 90 (fasting) to 110 mg (pre- and post-prandial) Last patient reported A1c: 5.8%  Using straws: No Drinking while eating: No Hair loss: Yes; decreasing Carbonated beverages: No N/V/D/C: Regurgitation/Nausea - decreasing; will have "bad day" 3 days/week Dumping syndrome: No  Recent physical activity:  None  Progress Towards Goal(s):  In progress.  Handouts given during visit include:  Bariatric Support Group calendar   Nutritional Diagnosis:  [place]-3.3 Overweight/obesity related to recent gastric bypass surgery as evidenced by patient following post-op nutritional guidelines in attempt to continue weight loss.    Intervention:  Nutrition education/reinforcement.  Monitoring/Evaluation:  Dietary intake, exercise, and body weight. Follow up in 3 months for 6 month post-op visit or sooner if needed.

## 2012-01-23 ENCOUNTER — Encounter (INDEPENDENT_AMBULATORY_CARE_PROVIDER_SITE_OTHER): Payer: Self-pay | Admitting: General Surgery

## 2012-01-25 ENCOUNTER — Encounter: Payer: Self-pay | Admitting: *Deleted

## 2012-01-27 ENCOUNTER — Telehealth (INDEPENDENT_AMBULATORY_CARE_PROVIDER_SITE_OTHER): Payer: Self-pay

## 2012-01-27 NOTE — Telephone Encounter (Signed)
The patient called to schedule a follow up from gastric bypass because she missed her follow up.  She was supposed to come back in 6 weeks.   Please call about working her in.

## 2012-01-28 ENCOUNTER — Telehealth (INDEPENDENT_AMBULATORY_CARE_PROVIDER_SITE_OTHER): Payer: Self-pay

## 2012-01-28 NOTE — Telephone Encounter (Signed)
Called patient to give follow up appt date & time,  no voicemail available.  Called mobile phone currently not accepting phone calls.

## 2012-01-29 ENCOUNTER — Encounter (HOSPITAL_COMMUNITY): Payer: Self-pay | Admitting: Emergency Medicine

## 2012-01-29 ENCOUNTER — Telehealth (INDEPENDENT_AMBULATORY_CARE_PROVIDER_SITE_OTHER): Payer: Self-pay | Admitting: General Surgery

## 2012-01-29 ENCOUNTER — Emergency Department (HOSPITAL_COMMUNITY)
Admission: EM | Admit: 2012-01-29 | Discharge: 2012-01-29 | Disposition: A | Discharge status: Home / Self Care | Payer: Managed Care, Other (non HMO) | Attending: Emergency Medicine | Admitting: Emergency Medicine

## 2012-01-29 ENCOUNTER — Other Ambulatory Visit: Payer: Self-pay

## 2012-01-29 ENCOUNTER — Emergency Department (HOSPITAL_COMMUNITY): Payer: Managed Care, Other (non HMO)

## 2012-01-29 DIAGNOSIS — R5381 Other malaise: Secondary | ICD-10-CM | POA: Insufficient documentation

## 2012-01-29 DIAGNOSIS — F29 Unspecified psychosis not due to a substance or known physiological condition: Secondary | ICD-10-CM | POA: Insufficient documentation

## 2012-01-29 DIAGNOSIS — N390 Urinary tract infection, site not specified: Secondary | ICD-10-CM

## 2012-01-29 DIAGNOSIS — R112 Nausea with vomiting, unspecified: Secondary | ICD-10-CM | POA: Insufficient documentation

## 2012-01-29 DIAGNOSIS — F319 Bipolar disorder, unspecified: Secondary | ICD-10-CM | POA: Insufficient documentation

## 2012-01-29 DIAGNOSIS — R42 Dizziness and giddiness: Secondary | ICD-10-CM | POA: Insufficient documentation

## 2012-01-29 DIAGNOSIS — R413 Other amnesia: Secondary | ICD-10-CM | POA: Insufficient documentation

## 2012-01-29 DIAGNOSIS — R259 Unspecified abnormal involuntary movements: Secondary | ICD-10-CM | POA: Insufficient documentation

## 2012-01-29 DIAGNOSIS — E119 Type 2 diabetes mellitus without complications: Secondary | ICD-10-CM | POA: Insufficient documentation

## 2012-01-29 DIAGNOSIS — H532 Diplopia: Secondary | ICD-10-CM | POA: Insufficient documentation

## 2012-01-29 LAB — DIFFERENTIAL
Eosinophils Absolute: 0.1 10*3/uL (ref 0.0–0.7)
Lymphs Abs: 2.4 10*3/uL (ref 0.7–4.0)
Monocytes Relative: 9 % (ref 3–12)
Neutrophils Relative %: 60 % (ref 43–77)

## 2012-01-29 LAB — AMMONIA: Ammonia: 16 umol/L (ref 11–60)

## 2012-01-29 LAB — COMPREHENSIVE METABOLIC PANEL
ALT: 14 U/L (ref 0–35)
Alkaline Phosphatase: 49 U/L (ref 39–117)
BUN: 7 mg/dL (ref 6–23)
CO2: 26 mEq/L (ref 19–32)
GFR calc Af Amer: >90 mL/min (ref >90)
GFR calc non Af Amer: >90 mL/min (ref >90)
Glucose, Bld: 75 mg/dL (ref 70–99)
Potassium: 3.9 mEq/L (ref 3.5–5.1)
Sodium: 139 mEq/L (ref 135–145)
Total Bilirubin: 0.2 mg/dL — ABNORMAL LOW (ref 0.3–1.2)

## 2012-01-29 LAB — URINE MICROSCOPIC-ADD ON

## 2012-01-29 LAB — URINALYSIS, ROUTINE W REFLEX MICROSCOPIC
Glucose, UA: NEGATIVE mg/dL
Hgb urine dipstick: NEGATIVE
Protein, ur: 30 mg/dL — AB

## 2012-01-29 LAB — RAPID URINE DRUG SCREEN, HOSP PERFORMED
Barbiturates: NOT DETECTED
Tetrahydrocannabinol: NOT DETECTED

## 2012-01-29 LAB — CBC
HCT: 39.6 % (ref 36.0–46.0)
Hemoglobin: 12.9 g/dL (ref 12.0–15.0)
MCH: 31 pg (ref 26.0–34.0)
MCV: 95.2 fL (ref 78.0–100.0)
RBC: 4.16 MIL/uL (ref 3.87–5.11)

## 2012-01-29 LAB — GLUCOSE, CAPILLARY: Glucose-Capillary: 74 mg/dL (ref 70–99)

## 2012-01-29 IMAGING — CT CT HEAD W/O CM
2 series · 16 of 30 positions shown, 20 images · non-contrast
Comparison: None.

CLINICAL DATA: Confusion and memory loss.

CT HEAD WITHOUT CONTRAST
TECHNIQUE: Contiguous axial images were obtained from the base of
the skull through the vertex without contrast

[Series 2: head w/o · axial · non-contrast · 0.43mm/px · z∈[+1611,+1746]mm · 13 of 33 slices shown, 17 images]
[im 3/33  brain]
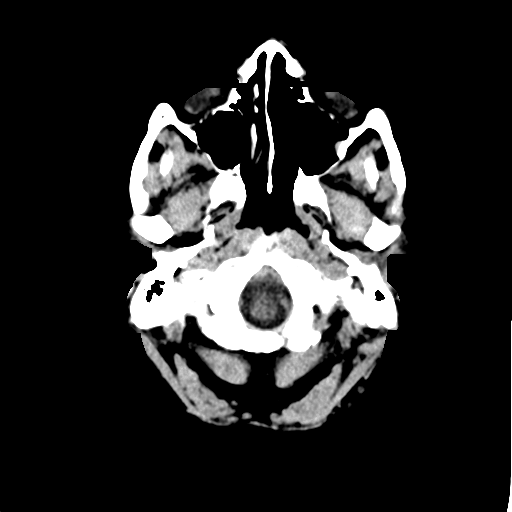
[im 3/33  bone]
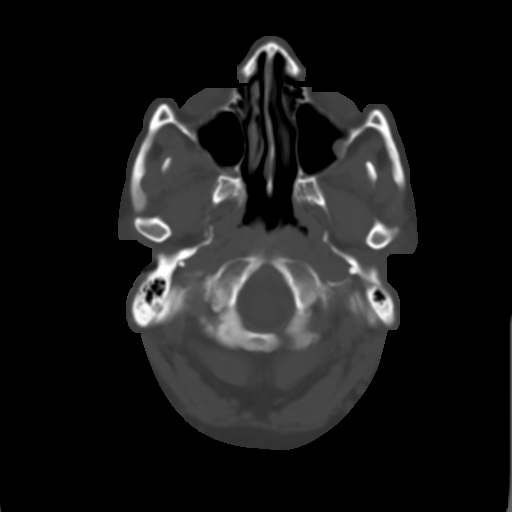
[im 5/33  brain]
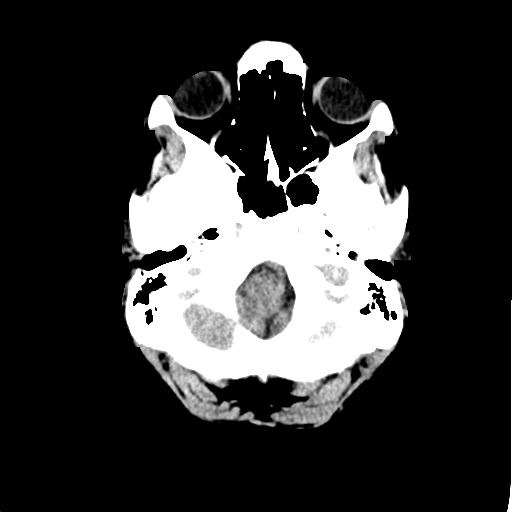
[im 7/33  brain]
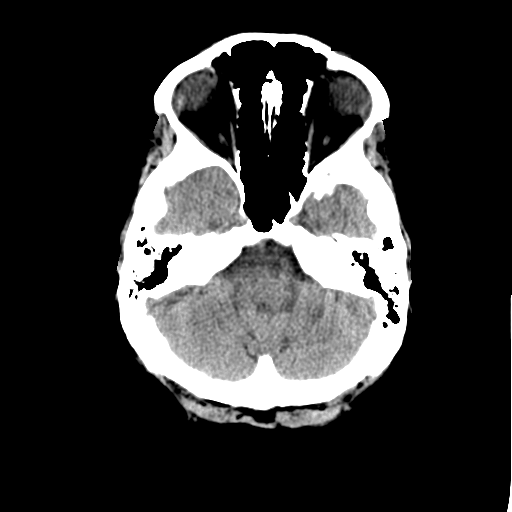
[im 10/33  brain]
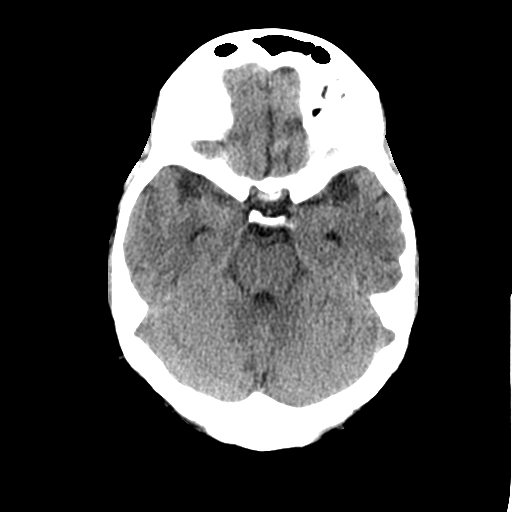
[im 12/33  brain]
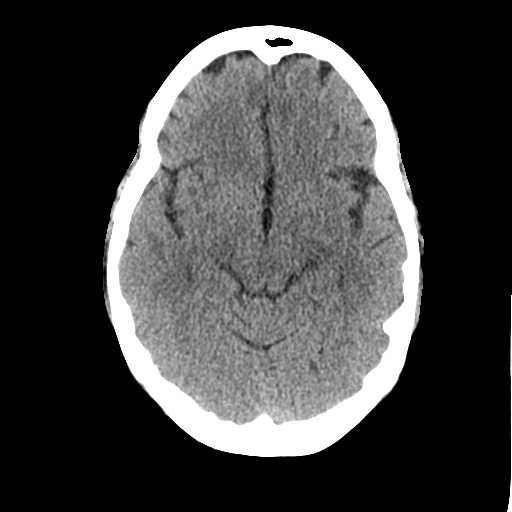
[im 12/33  bone]
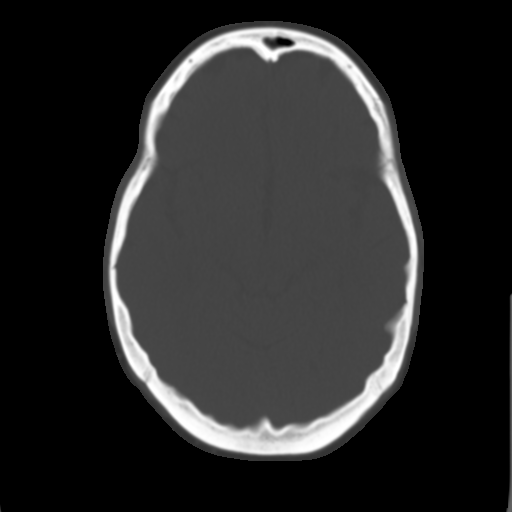
[im 14/33  brain]
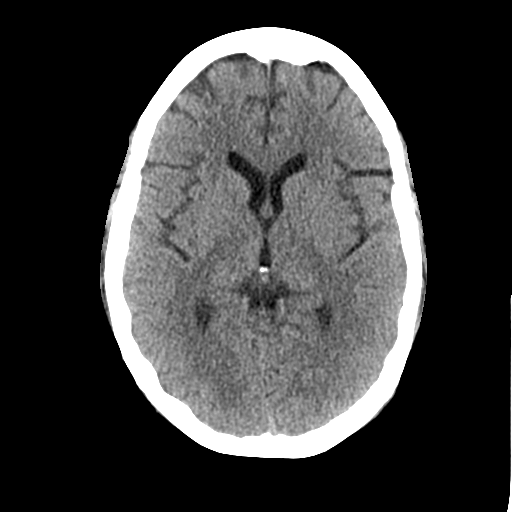
[im 17/33  brain]
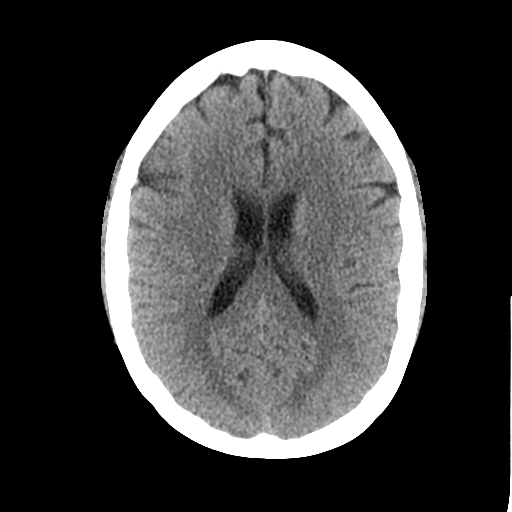
[im 19/33  brain]
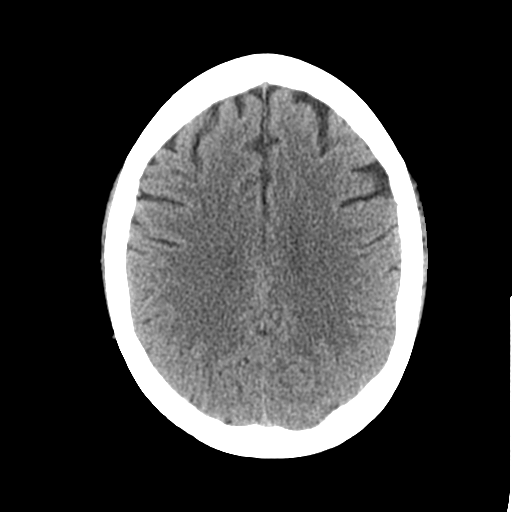
[im 21/33  brain]
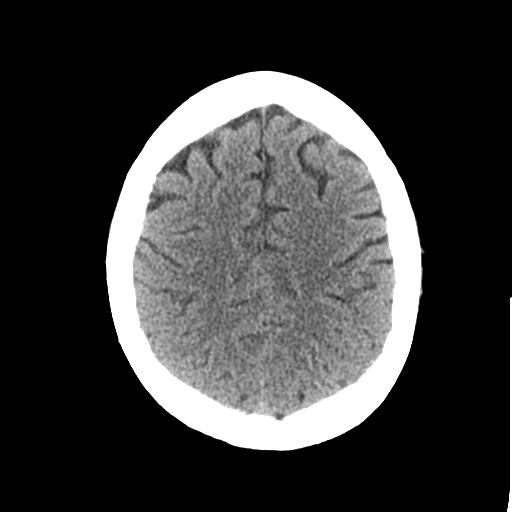
[im 21/33  bone]
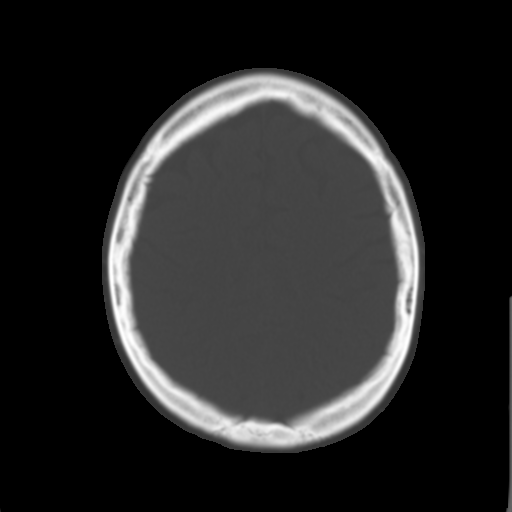
[im 23/33  brain]
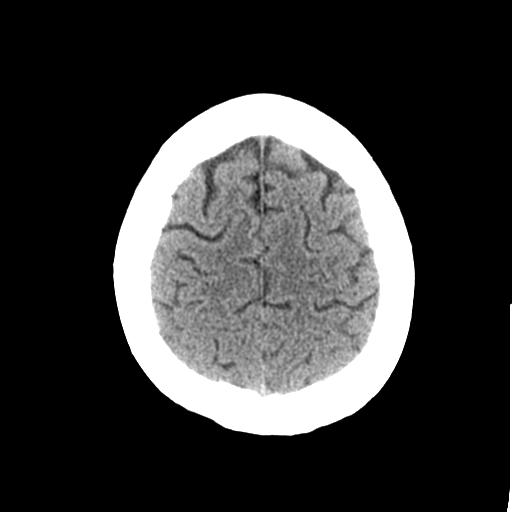
[im 26/33  brain]
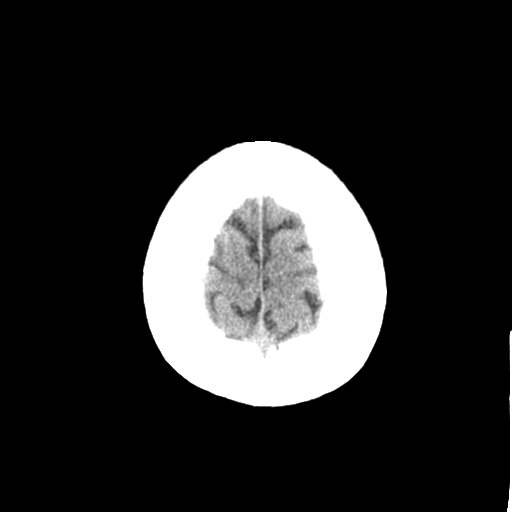
[im 28/33  brain]
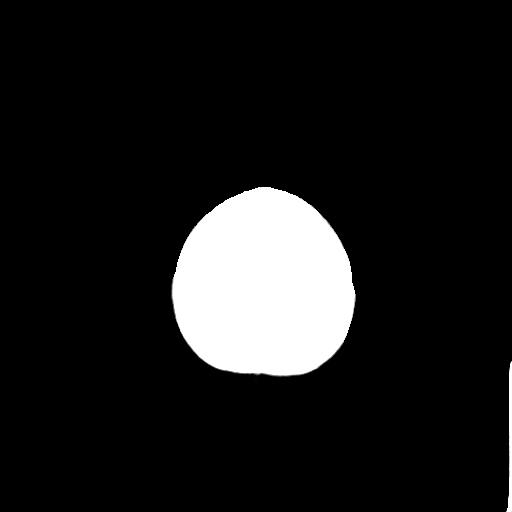
[im 30/33  brain]
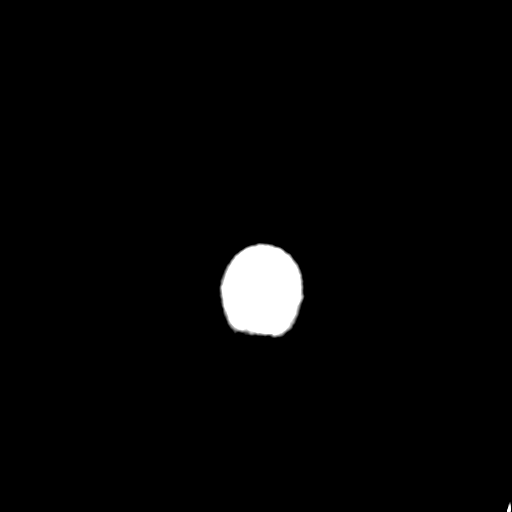
[im 30/33  bone]
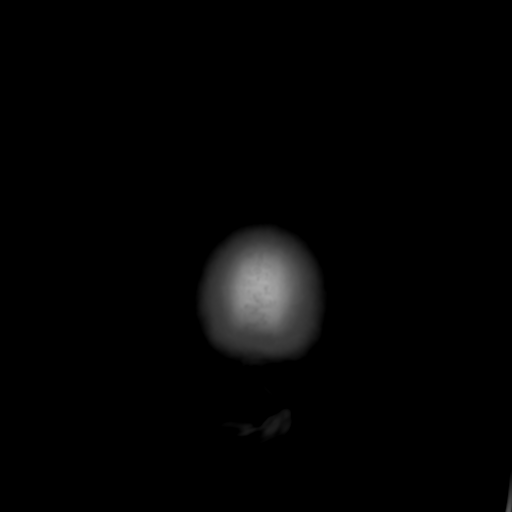

[Series 3: bone windows · axial · 0.43mm/px · z∈[+1611,+1656]mm · 3 of 33 slices shown]
[im 3/33  bone]
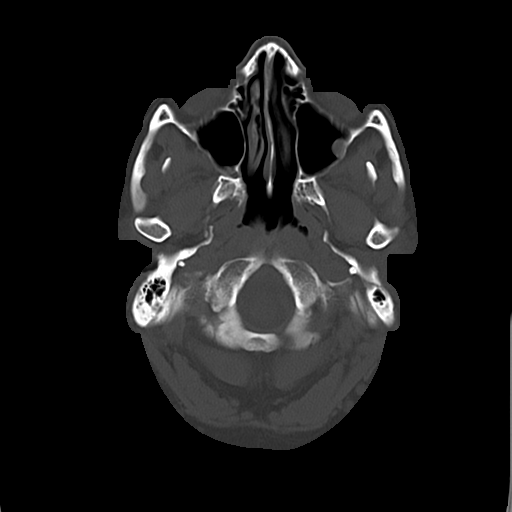
[im 7/33  bone]
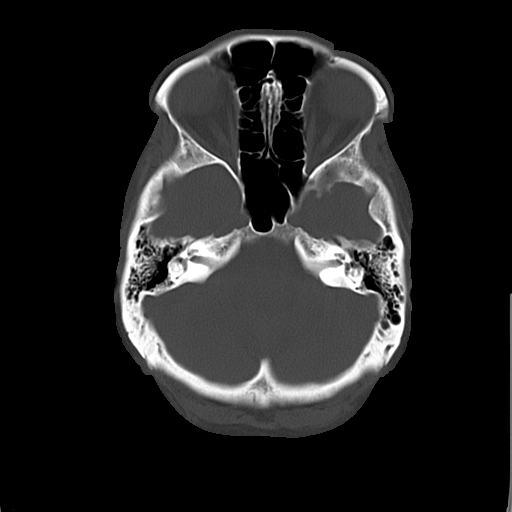
[im 12/33  bone]
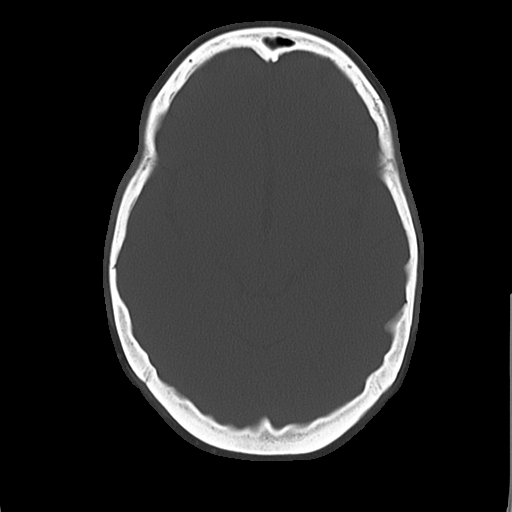

[16 of 30 positions shown; findings below may reference images not displayed]

FINDINGS: The brain has a normal appearance without evidence for
hemorrhage, acute infarction, hydrocephalus, or mass lesion.  There
is no extra axial fluid collection.  The skull and paranasal
sinuses are normal.
IMPRESSION: Normal CT of the head without contrast.

## 2012-01-29 MED ORDER — SULFAMETHOXAZOLE-TRIMETHOPRIM 800-160 MG PO TABS: 1.0000 | ORAL_TABLET | Freq: Two times a day (BID) | ORAL | Status: AC

## 2012-01-29 MED ORDER — SULFAMETHOXAZOLE-TMP DS 800-160 MG PO TABS
1.0000 | ORAL_TABLET | Freq: Once | ORAL | Status: AC
  Administered 2012-01-29: 1 via ORAL
  Filled 2012-01-29: qty 1

## 2012-01-29 NOTE — ED Notes (Signed)
To ED via private vehicle with c/o onset of confusion

## 2012-01-29 NOTE — ED Provider Notes (Signed)
Assumed care form Dr. Rubin Payor.  Reviewed h&p and reevaluated pt.   Agree with eval and plan.  Explained findings to pt and plan for tx.   She feels better now and understands test results and rec'd tx including decrease vpa and start abx. She will f/u with her psychiatrist concerning bipolar dz.    Cheri Guppy, MD 01/29/12 [phone]

## 2012-01-29 NOTE — ED Notes (Signed)
To CT via stretcher

## 2012-01-29 NOTE — Discharge Instructions (Signed)
Your Depakote level is mildly elevated.  Your CAT scan, and blood tests do not show any significant illness.  you have evidence of a urinary tract infection.  Reduce your Depakote to 3 times a day.  Use Bactrim for infection.  Followup with your psychiatrist, to discuss your Depakote dosage and then, he other treatment.  That is necessary, return for worse or uncontrolled symptoms

## 2012-01-29 NOTE — ED Provider Notes (Signed)
History     CSN: [id number]  Arrival date & time 01/29/12  1201   First MD Initiated Contact with Patient 01/29/12 1301      Chief Complaint  Patient presents with  . Altered Mental Status  . Diplopia  . Nausea    (Consider location/radiation/quality/duration/timing/severity/associated sxs/prior treatment) Patient is a 36 y.o. female presenting with altered mental status. The history is provided by the patient.  Altered Mental Status This is a new problem. Pertinent negatives include no chest pain, no abdominal pain, no headaches and no shortness of breath.   patient has had issues of memory loss and difficulty walking. She states it started yesterday or the day before. She states she had difficulty remembering what day it was was confused. She thought she had some appointments which he did not have. She also states that she felt bad. She had nausea and vomiting also. She states that she also had some double vision. She had trouble walking due to vision changes. She states that she looked down it looked like she was going to fall into a hole. No headaches. No trauma. She's recently had her Depakote level increased. No chest pain. No neck pain. No abdominal pain. Patient states the vision is improved and her walking is improved since she's been in the ER. She's a chronic tremor due to some of her medications.   Past Medical History  Diagnosis Date  . GERD (gastroesophageal reflux disease)   . Substance abuse   . Sleep apnea   . Headache   . Diabetes mellitus     type 2 -  diet controlled  . Bipolar affective disorder     Past Surgical History  Procedure Date  . Septoplasty 2003  . 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 Cancer Mother 29    brain, skin  . Cancer Father 63    kidney,  bone, rectal  . Cancer Paternal Aunt     breast    History  Substance Use Topics  . Smoking status: Never Smoker   . Smokeless tobacco: Never Used  . Alcohol Use: No    OB History    Grav Para Term Preterm Abortions TAB SAB Ect Mult Living                  Review of Systems  Constitutional: Negative for activity change and appetite change.  HENT: Negative for neck stiffness.   Eyes: Positive for visual disturbance. Negative for pain.  Respiratory: Negative for chest tightness and shortness of breath.   Cardiovascular: Negative for chest pain and leg swelling.  Gastrointestinal: Positive for nausea and vomiting. Negative for abdominal pain and diarrhea.  Genitourinary: Negative for flank pain.  Musculoskeletal: Negative for back pain.  Skin: Negative for rash.  Neurological: Negative for weakness, numbness and headaches.  Psychiatric/Behavioral: Positive for confusion and altered mental status. Negative for behavioral problems.    Allergies  Acetaminophen  Home Medications   Current Outpatient Rx  Name Route Sig Dispense Refill  . DULCOLAX PO Oral Take by mouth as needed. Patient reports taking as directed prn.    Brigitte Pulse XL PO Oral Take 450 mg by mouth at bedtime. Pt takes the 150 mg xl. 3 tabs for 450 mg    . CALCIUM PO Oral  Take by mouth as needed.    . B-12 PO Oral Take by mouth as needed.    Marland Kitchen DIVALPROEX SODIUM ER 500 MG PO TB24 Oral Take 2,000 mg by mouth at bedtime.     . ETONOGESTREL-ETHINYL ESTRADIOL 0.12-0.015 MG/24HR VA RING Vaginal Place 1 each vaginally every 28 (twenty-eight) days. Insert vaginally and leave in place for 3 consecutive weeks, then remove for 1 week.    . IRON PO Oral Take by mouth as needed.    Marland Kitchen LAMOTRIGINE 150 MG PO TABS Oral Take 150 mg by mouth daily.    Marland Kitchen ONE-DAILY MULTI VITAMINS PO TABS Oral Take 1 tablet by mouth as needed.    Marland Kitchen ONDANSETRON HCL 4 MG PO TABS Oral Take 1 tablet (4 mg total) by mouth every 8 (eight) hours as needed  for nausea. 20 tablet 1  . SERTRALINE HCL 50 MG PO TABS  50 mg daily.       BP 136/89  Pulse 100  Temp(Src) 98.2 F (36.8 C) (Oral)  Resp 16  SpO2 100%  LMP 01/19/2012  Physical Exam  Nursing note and vitals reviewed. Constitutional: She is oriented to person, place, and time. She appears well-developed and well-nourished.  HENT:  Head: Normocephalic and atraumatic.  Eyes: EOM are normal. Pupils are equal, round, and reactive to light.        Patient states that she gets a little bit of double vision with downward gaze  Neck: Normal range of motion. Neck supple.  Cardiovascular: Normal rate, regular rhythm and normal heart sounds.   No murmur heard. Pulmonary/Chest: Effort normal and breath sounds normal. No respiratory distress. She has no wheezes. She has no rales.  Abdominal: Soft. Bowel sounds are normal. She exhibits no distension. There is no tenderness. There is no rebound and no guarding.  Musculoskeletal: Normal range of motion.  Neurological: She is alert and oriented to person, place, and time. No cranial nerve deficit.       Finger-nose intact bilaterally. Mild tremor at the end.  Skin: Skin is warm and dry.  Psychiatric: She has a normal mood and affect. Her speech is normal.    ED Course  Procedures (including critical care time)   Labs Reviewed  GLUCOSE, CAPILLARY  CBC  DIFFERENTIAL  URINALYSIS, ROUTINE W REFLEX MICROSCOPIC  PREGNANCY, URINE  COMPREHENSIVE METABOLIC PANEL  AMMONIA  VALPROIC ACID LEVEL  URINE RAPID DRUG SCREEN (HOSP PERFORMED)   No results found.   No diagnosis found.    MDM  Patient with some confusion. She she had difficulty wearing what it was. She also stated she had some double vision and some difficulty walking. Since his symptoms have improved. CT is reassuring. She does have a clear nerve deficit on examination. Depakote level is still pending, as is the urine. Care will be turned over to Dr. Jerolyn Shin  R. Rubin Payor, MD 01/29/12 1539

## 2012-01-29 NOTE — ED Notes (Signed)
Per pharmacy tech, was able to list meds, doses, when she last took meds without hesitation

## 2012-01-29 NOTE — ED Notes (Signed)
To ed via private vehicle with sister- with c/o onset of confusion and memory loss- intermittent- started yesterday morning after an episode of nausea and dry heaves. States could not remember the day, thought she had a chiropractor appt yesterday, but she didn't. Cannot remember recent events, but is oriented to  Place, time, person at present.  Tearful, states "it's just the little things I can't remember and I couldn't remember what day of the week it was it scares me" -  Sister at bedside

## 2012-02-03 ENCOUNTER — Telehealth (INDEPENDENT_AMBULATORY_CARE_PROVIDER_SITE_OTHER): Payer: Self-pay

## 2012-02-03 NOTE — Telephone Encounter (Signed)
Called patient regarding follow up appointment, confirmed date 02/19/12 @ 10:45am.  Patient wanted to make Dr. Johna Sheriff aware that she was seen in the ED on 01/29/12 and diagnosed with a UTI.

## 2012-02-03 NOTE — Telephone Encounter (Signed)
Appointment scheduled for 02/19/12 @ 10:45 w/Dr. Johna Sheriff, no earlier dates available at this time.

## 2012-02-19 ENCOUNTER — Ambulatory Visit (INDEPENDENT_AMBULATORY_CARE_PROVIDER_SITE_OTHER): Payer: Managed Care, Other (non HMO) | Admitting: General Surgery

## 2012-02-27 ENCOUNTER — Encounter (INDEPENDENT_AMBULATORY_CARE_PROVIDER_SITE_OTHER): Payer: Self-pay | Admitting: General Surgery

## 2012-04-13 ENCOUNTER — Ambulatory Visit: Payer: Managed Care, Other (non HMO) | Admitting: *Deleted

## 2012-04-20 ENCOUNTER — Ambulatory Visit: Payer: Managed Care, Other (non HMO) | Admitting: *Deleted

## 2012-05-04 ENCOUNTER — Telehealth (INDEPENDENT_AMBULATORY_CARE_PROVIDER_SITE_OTHER): Payer: Self-pay

## 2012-05-04 ENCOUNTER — Other Ambulatory Visit (INDEPENDENT_AMBULATORY_CARE_PROVIDER_SITE_OTHER): Payer: Self-pay

## 2012-05-04 MED ORDER — ONDANSETRON HCL 4 MG PO TABS: 4.0000 mg | ORAL_TABLET | Freq: Three times a day (TID) | ORAL | Status: DC | PRN

## 2012-05-04 NOTE — Telephone Encounter (Signed)
Refill request rec'd by fax for Ondansetron HCL 4 mg- Dr. Johna Sheriff approved 2 refills for medication.  Faxed approval back to [facility] [phone]/cm

## 2012-05-12 ENCOUNTER — Ambulatory Visit (INDEPENDENT_AMBULATORY_CARE_PROVIDER_SITE_OTHER): Payer: Managed Care, Other (non HMO) | Admitting: General Surgery

## 2012-05-21 ENCOUNTER — Ambulatory Visit (INDEPENDENT_AMBULATORY_CARE_PROVIDER_SITE_OTHER): Payer: Managed Care, Other (non HMO) | Admitting: General Surgery

## 2012-07-01 ENCOUNTER — Ambulatory Visit (INDEPENDENT_AMBULATORY_CARE_PROVIDER_SITE_OTHER): Payer: Managed Care, Other (non HMO) | Admitting: General Surgery

## 2012-07-01 ENCOUNTER — Encounter (INDEPENDENT_AMBULATORY_CARE_PROVIDER_SITE_OTHER): Payer: Self-pay | Admitting: General Surgery

## 2012-07-01 ENCOUNTER — Other Ambulatory Visit (INDEPENDENT_AMBULATORY_CARE_PROVIDER_SITE_OTHER): Payer: Self-pay

## 2012-07-01 VITALS — BP 122/76 | HR 88 | Temp 97.8°F | Resp 14 | Ht 62.0 in | Wt 153.0 lb

## 2012-07-01 DIAGNOSIS — Z9884 Bariatric surgery status: Secondary | ICD-10-CM

## 2012-07-01 DIAGNOSIS — R112 Nausea with vomiting, unspecified: Secondary | ICD-10-CM

## 2012-07-01 DIAGNOSIS — K912 Postsurgical malabsorption, not elsewhere classified: Secondary | ICD-10-CM

## 2012-07-01 NOTE — Progress Notes (Signed)
Chief complaint: Followup gastric bypass  History: Patient returns for routine followup for gastric bypass now 8 months postop. We had a little trouble getting together for routine postop appointments. She has had some significant nausea throughout her course and today again states she is having episodic nausea but associated with abdominal pressure and bloating after meals. She states this is bad enough that about 2 or 3 times a week she has 2 stop and go to bed due to the symptoms. She has had 2 episodes of vomiting in the last couple of weeks which is new. This is not associated with severe pain but just pressure. Her bowels are moving okay. No fever chills urinary symptoms. She had preoperative sleep apnea but has not been using her CPAP and has no symptoms to suggest sleep apnea.  Exam: BP 122/76  Pulse 88  Temp 97.8 F (36.6 C) (Temporal)  Resp 14  Ht 5\' 2"  (1.575 m)  Wt 153 lb (69.4 kg)  BMI 27.98 kg/m2 Total weight loss 100 pounds. General: Appears well in no distress Skin: No rashes infection Lungs: Clear equal breath sounds bilaterally Lymph nodes: No cervical, supraventricular or inguinal nodes palpable Abdomen: Active bowel sounds. Incisions are well-healed without hernias. No appreciable tenderness or masses or organomegaly. Extremities: No edema or joint swelling Neurologic: Alert and oriented. Affect is normal.  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 She has clinical resolution of her sleep apnea. She however has more difficulties with food intolerance and nausea than we would like. I think this needs to be investigated and I have recommended proceeding with upper endoscopy to rule out stenosis or ulceration and we will repeat her gallbladder ultrasound. Obtain routine lab work and vitamin and iron levels. I will call her with all these results. I will see her back no later than 4 months for one year postop.

## 2012-07-02 ENCOUNTER — Other Ambulatory Visit (INDEPENDENT_AMBULATORY_CARE_PROVIDER_SITE_OTHER): Payer: Self-pay | Admitting: Surgery

## 2012-07-05 ENCOUNTER — Other Ambulatory Visit (INDEPENDENT_AMBULATORY_CARE_PROVIDER_SITE_OTHER): Payer: Self-pay | Admitting: General Surgery

## 2012-07-06 ENCOUNTER — Telehealth (INDEPENDENT_AMBULATORY_CARE_PROVIDER_SITE_OTHER): Payer: Self-pay | Admitting: General Surgery

## 2012-07-06 NOTE — Telephone Encounter (Signed)
Pt was seen by Dr. Johna Sheriff on 07/01/12, and he ordered an upper endoscopy.  Pt called because she has not been contacted about it yet.  Explained the schedulers will call her with the date, time and location once it is on Dr. Allene Pyo schedule.  She understands but is anxious to proceed.

## 2012-07-08 ENCOUNTER — Ambulatory Visit
Admission: RE | Admit: 2012-07-08 | Discharge: 2012-07-08 | Disposition: A | Discharge status: Home / Self Care | Payer: Managed Care, Other (non HMO) | Source: Ambulatory Visit | Attending: General Surgery | Admitting: General Surgery

## 2012-07-08 DIAGNOSIS — R112 Nausea with vomiting, unspecified: Secondary | ICD-10-CM

## 2012-07-08 DIAGNOSIS — Z9884 Bariatric surgery status: Secondary | ICD-10-CM

## 2012-07-08 IMAGING — US US ABDOMEN COMPLETE
1 series · 14 of 25 positions shown · non-contrast
Comparison: Post gastric bypass water-soluble upper GI of
10/21/2011

CLINICAL DATA: Nausea, vomiting, prior gastric bypass 1 year ago

COMPLETE ABDOMINAL ULTRASOUND

[Series 1: us abdomen complete · 0.37mm/px · 14 of 63 slices shown]
[im 1/63]
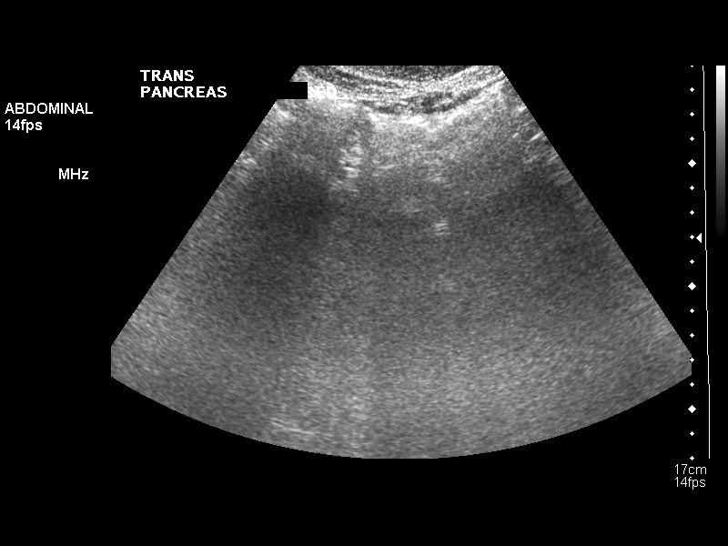
[im 6/63]
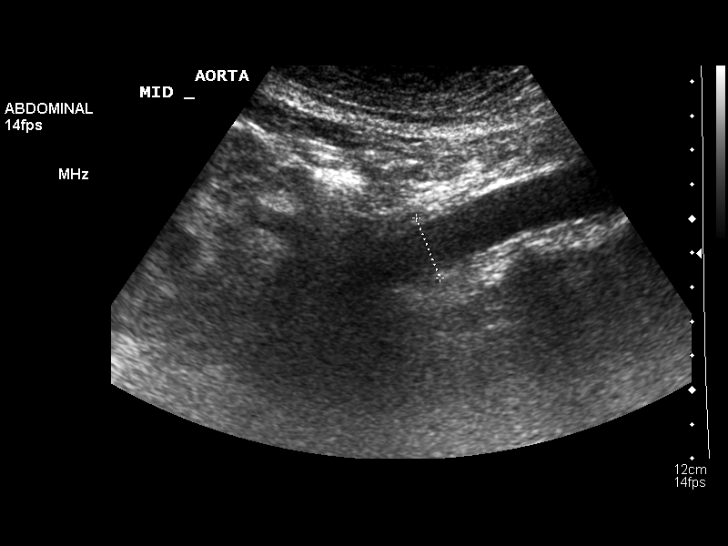
[im 11/63]
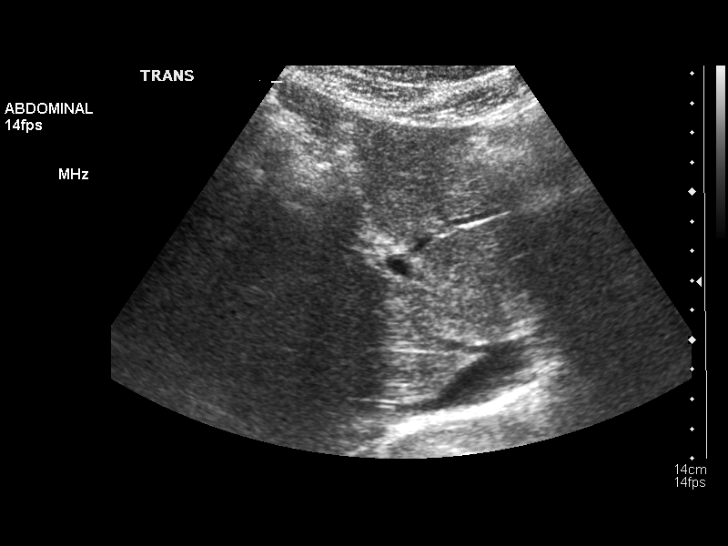
[im 16/63]
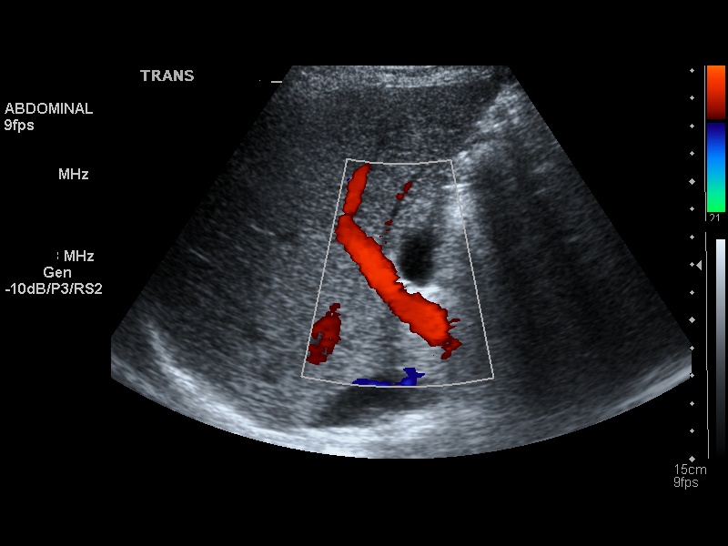
[im 21/63]
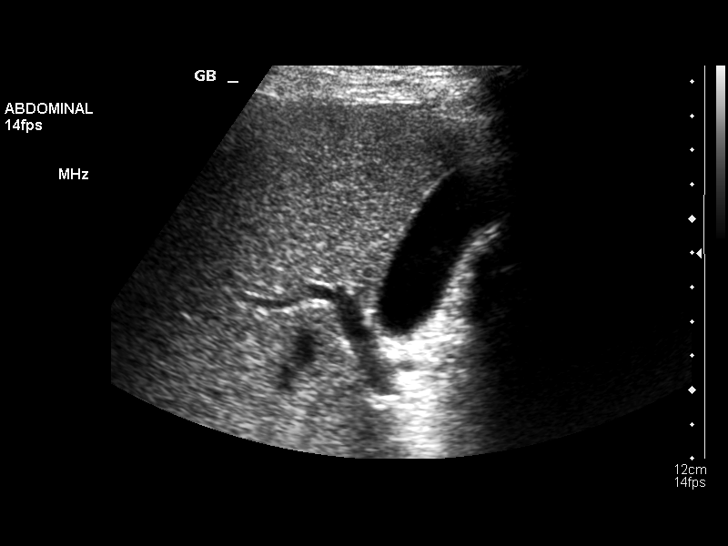
[im 24/63]
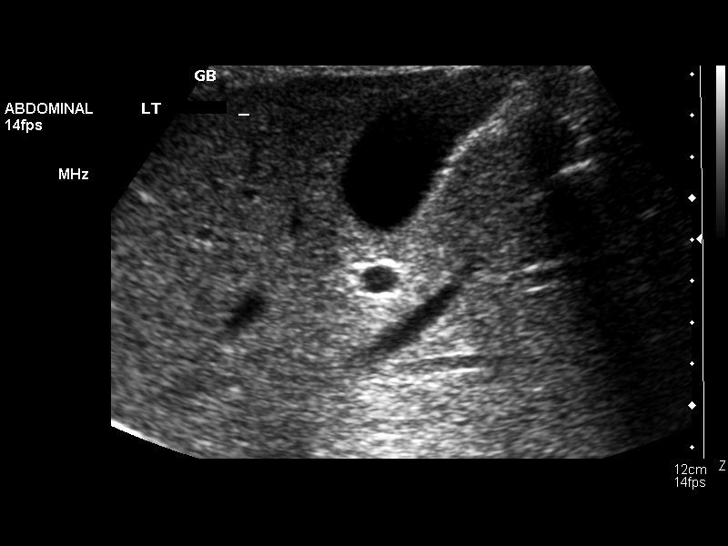
[im 29/63]
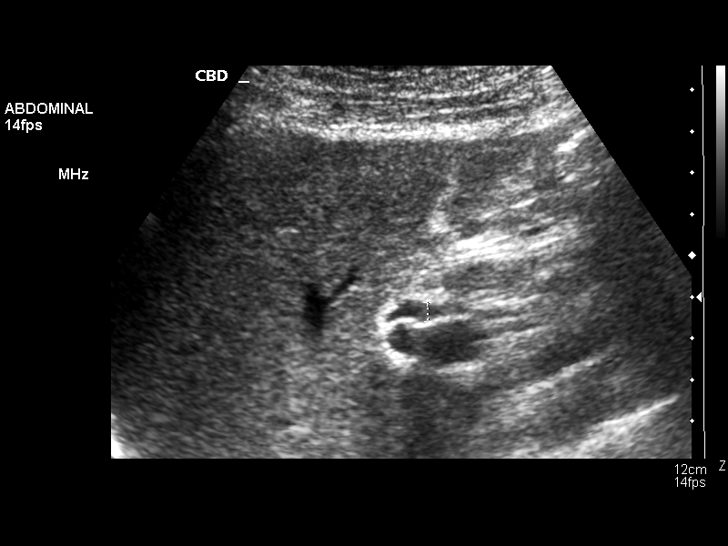
[im 34/63]
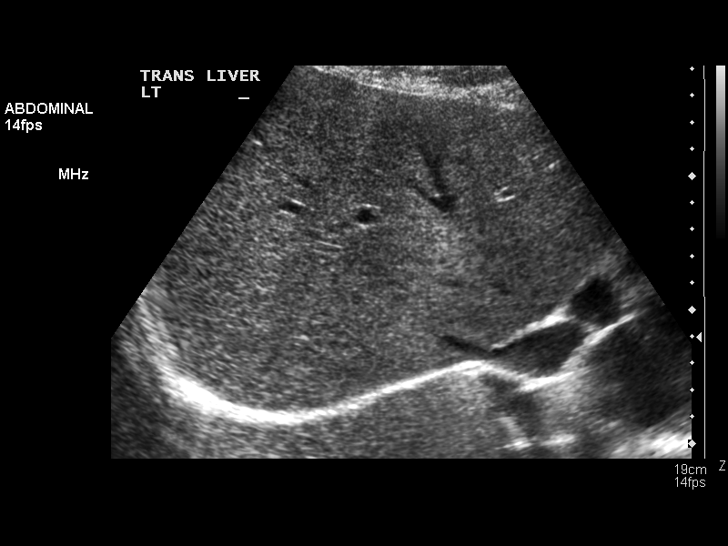
[im 39/63]
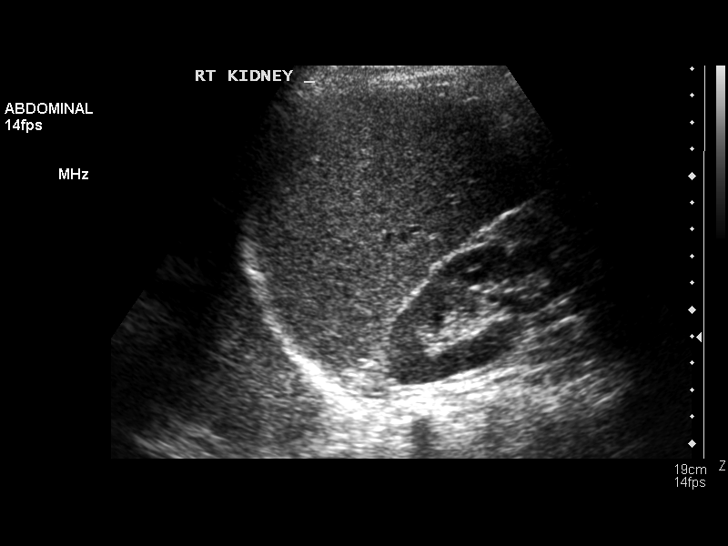
[im 42/63]
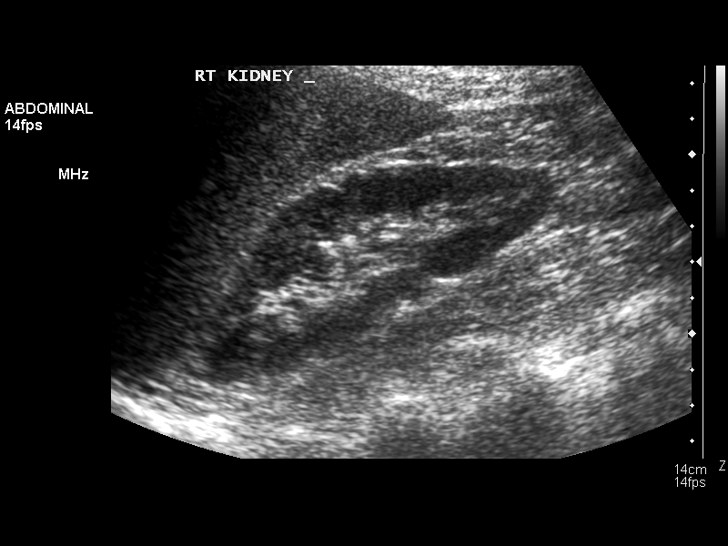
[im 47/63]
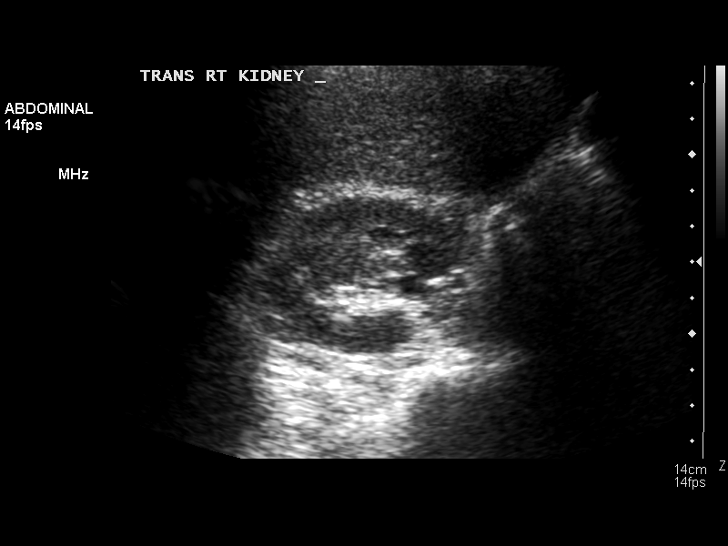
[im 52/63]
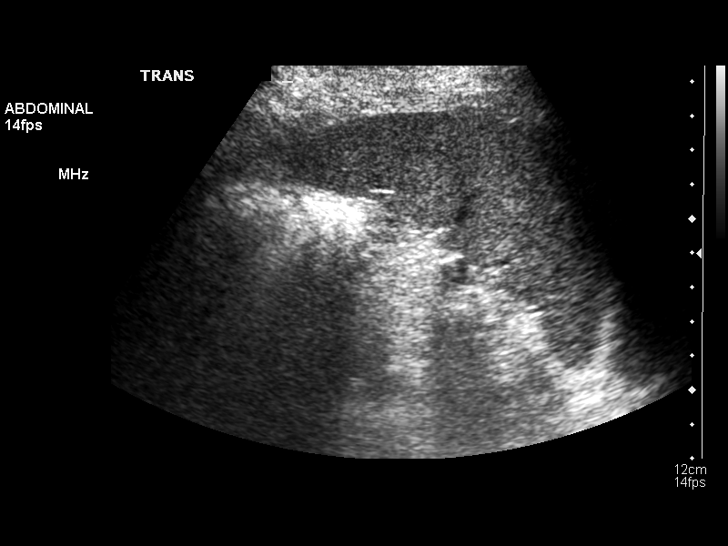
[im 57/63]
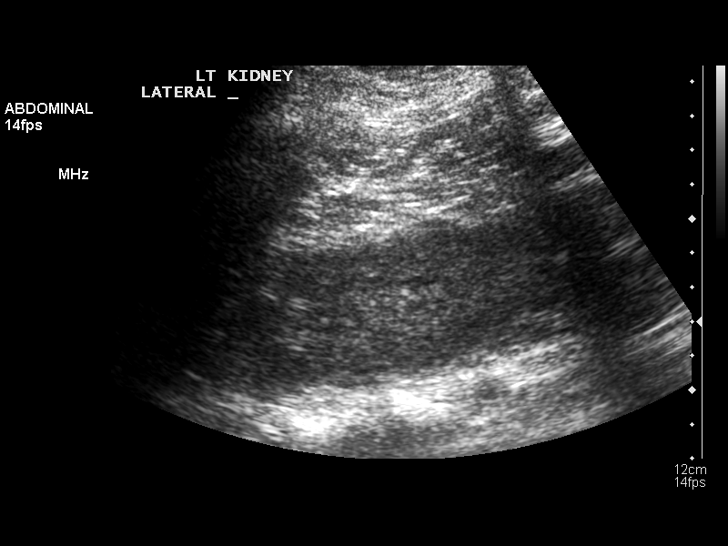
[im 63/63]
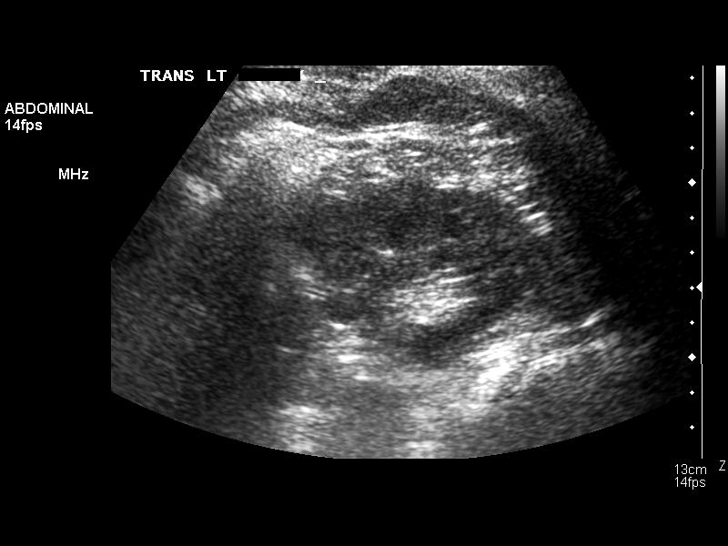

[14 of 25 positions shown; findings below may reference images not displayed]

FINDINGS: Gallbladder:  The gallbladder is visualized and no gallstones are
noted.  There is no pain over the gallbladder with compression.

Common bile duct:  The common bile duct is normal measuring 4.6 mm
in diameter.

Liver:  The liver has a normal echogenic pattern.  No focal
abnormality is seen.

IVC:  Portions of the IVC are obscured by bowel gas.

Pancreas:  The pancreas is completely obscured by bowel gas as
well.

Spleen:  The spleen is normal measuring 8.7 cm sagittally.

Right Kidney:  No hydronephrosis is seen.  The right kidney
measures 11.3 cm sagittally.

Left Kidney:  No hydronephrosis is noted.  The left kidney measures
12.3 cm.

Abdominal aorta:  The abdominal aorta is normal in caliber.
IMPRESSION: 1.  No gallstones.  No ductal dilatation.
2.  The pancreas is obscured by bowel gas.

## 2012-08-05 ENCOUNTER — Encounter (HOSPITAL_COMMUNITY)
Admission: RE | Disposition: A | Discharge status: Home / Self Care | Payer: Self-pay | Source: Ambulatory Visit | Attending: Surgery

## 2012-08-05 ENCOUNTER — Encounter (HOSPITAL_COMMUNITY): Payer: Self-pay

## 2012-08-05 ENCOUNTER — Ambulatory Visit (HOSPITAL_COMMUNITY)
Admission: RE | Admit: 2012-08-05 | Discharge: 2012-08-05 | Disposition: A | Discharge status: Home / Self Care | Payer: Managed Care, Other (non HMO) | Source: Ambulatory Visit | Attending: Surgery | Admitting: Surgery

## 2012-08-05 DIAGNOSIS — Z9884 Bariatric surgery status: Secondary | ICD-10-CM | POA: Insufficient documentation

## 2012-08-05 DIAGNOSIS — R112 Nausea with vomiting, unspecified: Secondary | ICD-10-CM

## 2012-08-05 HISTORY — PX: ESOPHAGOGASTRODUODENOSCOPY: SHX5428

## 2012-08-05 SURGERY — EGD (ESOPHAGOGASTRODUODENOSCOPY)
Anesthesia: Moderate Sedation

## 2012-08-05 MED ORDER — FENTANYL CITRATE 0.05 MG/ML IJ SOLN
INTRAMUSCULAR | Status: AC
  Filled 2012-08-05: qty 2

## 2012-08-05 MED ORDER — BUTAMBEN-TETRACAINE-BENZOCAINE 2-2-14 % EX AERO
INHALATION_SPRAY | CUTANEOUS | Status: DC | PRN
  Administered 2012-08-05: 4 via TOPICAL

## 2012-08-05 MED ORDER — MIDAZOLAM HCL 10 MG/2ML IJ SOLN
INTRAMUSCULAR | Status: DC | PRN
  Administered 2012-08-05 (×2): 1 mg via INTRAVENOUS
  Administered 2012-08-05 (×2): 2 mg via INTRAVENOUS

## 2012-08-05 MED ORDER — SODIUM CHLORIDE 0.9 % IV SOLN
INTRAVENOUS | Status: DC
  Administered 2012-08-05: 10:00:00 via INTRAVENOUS

## 2012-08-05 MED ORDER — MIDAZOLAM HCL 10 MG/2ML IJ SOLN
INTRAMUSCULAR | Status: AC
  Filled 2012-08-05: qty 2

## 2012-08-05 MED ORDER — FENTANYL CITRATE 0.05 MG/ML IJ SOLN
INTRAMUSCULAR | Status: DC | PRN
  Administered 2012-08-05 (×3): 25 ug via INTRAVENOUS

## 2012-08-05 NOTE — H&P (Signed)
Name:  Belinda [name] MRN:  [id number]  Chief complaint: Followup gastric bypass  History: Patient returns for routine followup for gastric bypass now 8 months postop. We had a little trouble getting together for routine postop appointments. She has had some significant nausea throughout her course and today again states she is having episodic nausea but associated with abdominal pressure and bloating after meals. She states this is bad enough that about 2 or 3 times a week she has 2 stop and go to bed due to the symptoms. She has had 2 episodes of vomiting in the last couple of weeks which is new. This is not associated with severe pain but just pressure. Her bowels are moving okay. No fever chills urinary symptoms. She had preoperative sleep apnea but has not been using her CPAP and has no symptoms to suggest sleep apnea.   Exam:  BP 111/79  Pulse 75  Temp 97.8 F (36.6 C) (Oral)  Resp 15  Ht 5\' 3"  (1.6 m)  Wt 157 lb (71.215 kg)  BMI 27.81 kg/m2  SpO2 100%  LMP 08/02/2012 BMI 27.98 kg/m2  Total weight loss 100 pounds.   General: Appears well in no distress  Skin: No rashes infection  Lungs: Clear equal breath sounds bilaterally  Lymph nodes: No cervical, supraventricular or inguinal nodes palpable  Abdomen: Active bowel sounds. Incisions are well-healed without hernias. No appreciable tenderness or masses or organomegaly.  Extremities: No edema or joint swelling  Neurologic: Alert and oriented. Affect is normal.   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 She has clinical resolution of her sleep apnea. She however has more difficulties with food intolerance and nausea than we would like. I think this needs to be investigated and I have recommended proceeding with upper endoscopy to rule out stenosis or ulceration and we will repeat her gallbladder ultrasound. Obtain routine lab work and vitamin and iron levels. I will call her  with all these results. I will see her back no later than 4 months for one year postop.  Discussed procedure with patient.  Her husband, Freida Busman, is here with her today.  Ovidio Kin, MD, [facility] Surgery Pager: [phone] Office phone:  [phone]

## 2012-08-05 NOTE — Op Note (Addendum)
08/05/2012  10:39 AM  PATIENT:  Belinda [name], 36 y.o., female, MRN: [id number]  PREOP DIAGNOSIS:  Nausea and vomiting post RYGB   POSTOP DIAGNOSIS:   Normal gastric pouch, post RYGB  PROCEDURE:  Esophagogastrojejunoscopy, biopsy for CLO  SURGEON:   Ovidio Kin, M.D.  ANESTHESIA:   Fentanyl  75 mcg   Versed 6 mg  INDICATIONS FOR PROCEDURE:  Belinda [name] is a 36 y.o. (DOB: [birth year])  white female whose primary care physician is FULP, CAMMIE, MD and comes for upper endoscopy to evaluate nausea and vomiting.  The patient had a RYGB on 10/20/2011 by Dr. Johna Sheriff.   The indications and risks of the endoscopy were explained to the patient.  The risks include, but are not limited to, perforation, bleeding, or injury to the bowel.  If balloon dilatation is needed, the risk of perforation is higher.  PROCEDURE:  The patient was in room 3 at [facility] endoscopy unit.  The patient was monitored with a pulse oximetry, BP cuff, and EKG.  The patient has nasal O2 flowing during the procedure.   The back of the throat was anesthestized with Ceticaine x 3.  The patient was positioned in the left lateral decubitus position.  The patient was given Fentanyl and Versed.  A flexible Pentax endoscope was passed down the throat without difficulty.   Findings include:   Esophagus:   Normal   GE junction at:  37 cm   Stomach pouch: Normal with atrophic mucosa   Gastrojejunal anastomosis:   41 cm.  There were some visible staples at the anastomosis, but no evidence of ulcer.   Efferent jejunal limb:  Normal  Afferent jejunal limb:  Normal  (I went down about 10 cm)   The patient did gag more than normal upper endoscopies.  Whether this has anything to do with her symptoms is hard to tell.   CLO test:  Obtained, results pending  PLAN:   Photos taken and given to patient.   The patient had a normal upper endo.  Her [place] of her gall bladder was also normal.  I gave her a copy of the abdominal US.  She  will be back in touch with Dr. Johna Sheriff for follow up.  Ovidio Kin, MD, [facility] Surgery Pager: [phone] Office phone:  [phone]

## 2012-08-06 ENCOUNTER — Encounter (HOSPITAL_COMMUNITY): Payer: Self-pay

## 2012-08-06 ENCOUNTER — Encounter (HOSPITAL_COMMUNITY): Payer: Self-pay | Admitting: Surgery

## 2012-09-10 ENCOUNTER — Telehealth (INDEPENDENT_AMBULATORY_CARE_PROVIDER_SITE_OTHER): Payer: Self-pay

## 2012-09-10 DIAGNOSIS — R109 Unspecified abdominal pain: Secondary | ICD-10-CM

## 2012-09-10 DIAGNOSIS — R110 Nausea: Secondary | ICD-10-CM

## 2012-09-10 DIAGNOSIS — K289 Gastrojejunal ulcer, unspecified as acute or chronic, without hemorrhage or perforation: Secondary | ICD-10-CM

## 2012-09-10 MED ORDER — TRAMADOL HCL 50 MG PO TABS: 50.0000 mg | ORAL_TABLET | Freq: Four times a day (QID) | ORAL | Status: AC | PRN

## 2012-09-10 MED ORDER — ONDANSETRON HCL 4 MG PO TABS: 4.0000 mg | ORAL_TABLET | Freq: Three times a day (TID) | ORAL | Status: DC | PRN

## 2012-09-10 NOTE — Telephone Encounter (Signed)
Called to patient, no answer, left voice message to call our office.  RE:  Patient refill request for Tramadol and Ondansetron have been refilled per verbal order by Dr. Johna Sheriff.  Need to make sure patient has stopped smoking per Dr. Johna Sheriff.

## 2013-05-12 ENCOUNTER — Emergency Department (HOSPITAL_COMMUNITY)
Admission: EM | Admit: 2013-05-12 | Discharge: 2013-05-13 | Disposition: A | Discharge status: Home / Self Care | Payer: Managed Care, Other (non HMO) | Attending: Emergency Medicine | Admitting: Emergency Medicine

## 2013-05-12 ENCOUNTER — Encounter (HOSPITAL_COMMUNITY): Payer: Self-pay | Admitting: Cardiology

## 2013-05-12 DIAGNOSIS — E119 Type 2 diabetes mellitus without complications: Secondary | ICD-10-CM | POA: Insufficient documentation

## 2013-05-12 DIAGNOSIS — F319 Bipolar disorder, unspecified: Secondary | ICD-10-CM | POA: Insufficient documentation

## 2013-05-12 DIAGNOSIS — K219 Gastro-esophageal reflux disease without esophagitis: Secondary | ICD-10-CM | POA: Insufficient documentation

## 2013-05-12 DIAGNOSIS — R51 Headache: Secondary | ICD-10-CM | POA: Insufficient documentation

## 2013-05-12 DIAGNOSIS — G4730 Sleep apnea, unspecified: Secondary | ICD-10-CM | POA: Insufficient documentation

## 2013-05-12 DIAGNOSIS — Z79899 Other long term (current) drug therapy: Secondary | ICD-10-CM | POA: Insufficient documentation

## 2013-05-12 DIAGNOSIS — R519 Headache, unspecified: Secondary | ICD-10-CM

## 2013-05-12 LAB — GLUCOSE, CAPILLARY

## 2013-05-12 MED ORDER — MAGNESIUM SULFATE 40 MG/ML IJ SOLN
2.0000 g | INTRAMUSCULAR | Status: AC
  Administered 2013-05-12: 2 g via INTRAVENOUS
  Filled 2013-05-12: qty 50

## 2013-05-12 MED ORDER — DIPHENHYDRAMINE HCL 50 MG/ML IJ SOLN
25.0000 mg | Freq: Once | INTRAMUSCULAR | Status: AC
  Administered 2013-05-12: 25 mg via INTRAVENOUS
  Filled 2013-05-12: qty 1

## 2013-05-12 MED ORDER — KETOROLAC TROMETHAMINE 30 MG/ML IJ SOLN
30.0000 mg | Freq: Once | INTRAMUSCULAR | Status: AC
  Administered 2013-05-12: 30 mg via INTRAVENOUS
  Filled 2013-05-12: qty 1

## 2013-05-12 MED ORDER — METOCLOPRAMIDE HCL 5 MG/ML IJ SOLN
10.0000 mg | Freq: Once | INTRAMUSCULAR | Status: AC
  Administered 2013-05-12: 10 mg via INTRAVENOUS
  Filled 2013-05-12: qty 2

## 2013-05-12 MED ORDER — SODIUM CHLORIDE 0.9 % IV BOLUS (SEPSIS)
1000.0000 mL | Freq: Once | INTRAVENOUS | Status: AC
  Administered 2013-05-12: 1000 mL via INTRAVENOUS

## 2013-05-12 NOTE — ED Notes (Signed)
CBG is 88. Notified Nurse Magda Paganini.

## 2013-05-12 NOTE — ED Notes (Signed)
Pt reports that she has had a generalized headache since Monday. States that the pain has been severe, but denies any changes in her vision. States that she has a hx of migraines. Denies any n/v. Pt A&Ox4. Skin warm and dry. No distress noted.

## 2013-05-12 NOTE — ED Notes (Signed)
PA at bedside.

## 2013-05-12 NOTE — ED Provider Notes (Signed)
CSN: [id number]     Arrival date & time 05/12/13  1705 History     First MD Initiated Contact with Patient 05/12/13 2109     Chief Complaint  Patient presents with  . Headache   (Consider location/radiation/quality/duration/timing/severity/associated sxs/prior Treatment) HPI Comments: Patient presents emergency department with chief complaint of headache. She states that she has a history of migraines. She states that this one feels somewhat similar, but is worse, and was not associated with photophobia. She states the headache started on Monday, it has gradually worsened. It is not thunderclap in nature. She is tried taking Maxalt with no relief. She also saw her primary care provider, who gave her shot of Toradol with some relief. She denies nausea, vomiting, fevers or, chills, or weakness of the extremities. She denies any ataxia. She states the pain is severe.  The history is provided by the patient. No language interpreter was used.    Past Medical History  Diagnosis Date  . GERD (gastroesophageal reflux disease)   . Substance abuse   . Sleep apnea   . Headache(784.0)   . Diabetes mellitus     type 2 -  diet controlled  . Bipolar affective disorder    Past Surgical History  Procedure Laterality Date  . Septoplasty  2003  . 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 Esophagogastroduodenoscopy  08/05/2012    Procedure: ESOPHAGOGASTRODUODENOSCOPY (EGD);  Surgeon: Kandis Cocking, MD;  Location: [facility];  Service: General;  Laterality: N/A;   Family History  Problem Relation Age of Onset  . Cancer Mother 64    brain, skin  . Cancer Father 59    kidney, bone, rectal  . Cancer Paternal Aunt     breast   History  Substance Use Topics  . Smoking status: Never Smoker   . Smokeless tobacco: Never Used  . Alcohol Use: No   OB  History   Grav Para Term Preterm Abortions TAB SAB Ect Mult Living                 Review of Systems  All other systems reviewed and are negative.    Allergies  Acetaminophen  Home Medications   Current Outpatient Rx  Name  Route  Sig  Dispense  Refill  . ABILIFY 15 MG tablet   Oral   Take 22.5 mg by mouth at bedtime.          Marland Kitchen buPROPion (WELLBUTRIN XL) 150 MG 24 hr tablet   Oral   Take 450 mg by mouth at bedtime.         Marland Kitchen CALCIUM PO   Oral   Take 1 tablet by mouth at bedtime.          Marland Kitchen etonogestrel-ethinyl estradiol (NUVARING) 0.12-0.015 MG/24HR vaginal ring   Vaginal   Place 1 each vaginally every 28 (twenty-eight) days. Insert vaginally and leave in place for 3 consecutive weeks, then remove for 1 week.         . lamoTRIgine (LAMICTAL) 200 MG tablet   Oral   Take 200 mg by mouth at bedtime.         . norgestimate-ethinyl estradiol (ORTHO-CYCLEN,SPRINTEC,PREVIFEM) 0.25-35 MG-MCG tablet   Oral   Take 1 tablet by mouth daily.         . ondansetron (ZOFRAN) 4 MG tablet   Oral  Take 1 tablet (4 mg total) by mouth every 8 (eight) hours as needed for nausea.   20 tablet   0   . rizatriptan (MAXALT) 10 MG tablet   Oral   Take 10 mg by mouth every 2 (two) hours as needed for migraine.          . sertraline (ZOLOFT) 100 MG tablet   Oral   Take 100 mg by mouth at bedtime.         . traMADol (ULTRAM) 50 MG tablet   Oral   Take 1 tablet (50 mg total) by mouth every 6 (six) hours as needed for pain.   20 tablet   0    BP 118/84  Pulse 91  Temp(Src) 98.5 F (36.9 C) (Oral)  Resp 20  SpO2 99% Physical Exam  Nursing note and vitals reviewed. Constitutional: She is oriented to person, place, and time. She appears well-developed and well-nourished.  HENT:  Head: Normocephalic and atraumatic.  Right Ear: External ear normal.  Left Ear: External ear normal.  Non-tender over temporal artery, no increased pain with chewing.  Eyes:  Conjunctivae and EOM are normal. Pupils are equal, round, and reactive to light.  No papilledema  Neck: Normal range of motion. Neck supple.  No pain with neck flexion, no meningismus  Cardiovascular: Normal rate, regular rhythm and normal heart sounds.  Exam reveals no gallop and no friction rub.   No murmur heard. Pulmonary/Chest: Effort normal and breath sounds normal. No respiratory distress. She has no wheezes. She has no rales. She exhibits no tenderness.  Abdominal: Soft. Bowel sounds are normal. She exhibits no distension and no mass. There is no tenderness. There is no rebound and no guarding.  Musculoskeletal: Normal range of motion. She exhibits no edema and no tenderness.  Normal gait.  Neurological: She is alert and oriented to person, place, and time. She has normal reflexes.  CN 3-12 intact, no pronator drift, normal shin to heel, normal RAM, sensation and strength intact bilaterally.  Skin: Skin is warm and dry.  Psychiatric: She has a normal mood and affect. Her behavior is normal. Judgment and thought content normal.    ED Course   Procedures (including critical care time)  Labs Reviewed  GLUCOSE, CAPILLARY   Results for orders placed during the hospital encounter of 05/12/13  GLUCOSE, CAPILLARY      Result Value Range   Glucose-Capillary 88  70 - 99 mg/dL   Comment 1 Documented in Chart     Comment 2 Notify RN     Ct Head Wo Contrast  05/13/2013   *RADIOLOGY REPORT*  Clinical Data:  Headache  CT HEAD WITHOUT CONTRAST  Technique:  Contiguous axial images were obtained from the base of the skull through the vertex without contrast  Comparison:  01/29/2012  Findings:  The brain has a normal appearance without evidence for hemorrhage, acute infarction, hydrocephalus, or mass lesion.  There is no extra axial fluid collection.  The skull and paranasal sinuses are normal.  IMPRESSION: Normal CT of the head without contrast.   Original Report Authenticated By: Judie Petit. Shick, M.D.      1. Headache     MDM  Patient with headache. Will treat with migraine cocktail, and will reevaluate. No worrisome signs, no fevers, no meningeal signs, no sensation or strength deficits.  11:14 PM Patient seen by and discussed with Dr. Criss Alvine.  Patient states that her headache is not improved.    Will order CT head.  12:52 AM CT head is negative.  Discussed the patient with Dr. Criss Alvine, who also saw the patient.  Patient elects to defer LP, and understands that we cannot rule out a bleed without it.  Return precautions are given.  The patient states that she is feeling better after receiving magnesium.  Will discharge to home.  Patient is stable and ready for discharge.  Roxy Horseman, PA-C 05/13/13 [phone]

## 2013-05-12 NOTE — ED Notes (Signed)
Pt reports her h/a started Monday after a manic episode of her bipolar disorder. Pt reports going to the Dr. Tuesday and received Toradol IM, but states when it wore off the h/a came back, and has not had any relief since. Pt has h/x of migraines and states her meds she takes for them are not working. Pt reports pain is mostly in the back of her neck and states it is stiff. Denies n/v, sensitivity to light.

## 2013-05-13 ENCOUNTER — Emergency Department (HOSPITAL_COMMUNITY): Payer: Managed Care, Other (non HMO)

## 2013-05-13 IMAGING — CT CT HEAD W/O CM
2 series · 16 of 30 positions shown, 20 images · non-contrast
Comparison: 01/29/2012

CLINICAL DATA: Headache

CT HEAD WITHOUT CONTRAST
TECHNIQUE: Contiguous axial images were obtained from the base of
the skull through the vertex without contrast

[Series 2: head w/o · axial · non-contrast · 0.49mm/px · z∈[+60,+190]mm · 13 of 32 slices shown, 17 images]
[im 3/32  brain]
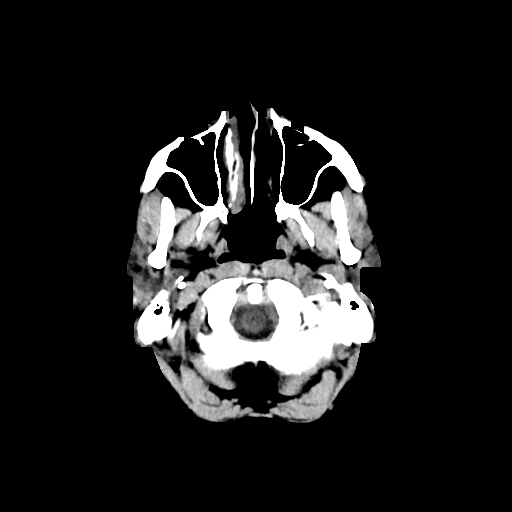
[im 3/32  bone]
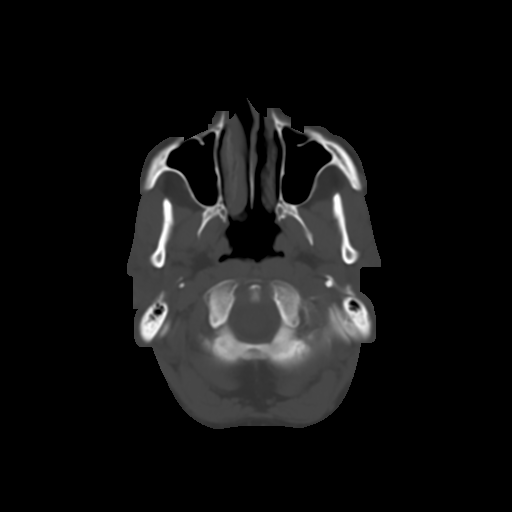
[im 5/32  brain]
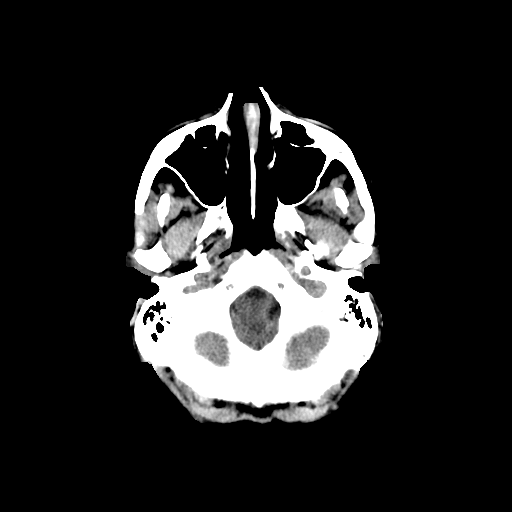
[im 7/32  brain]
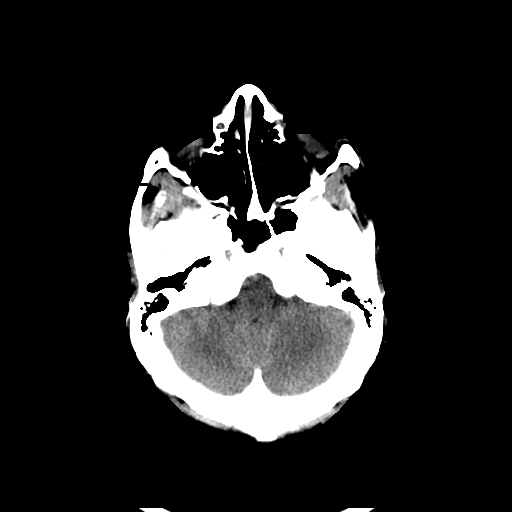
[im 9/32  brain]
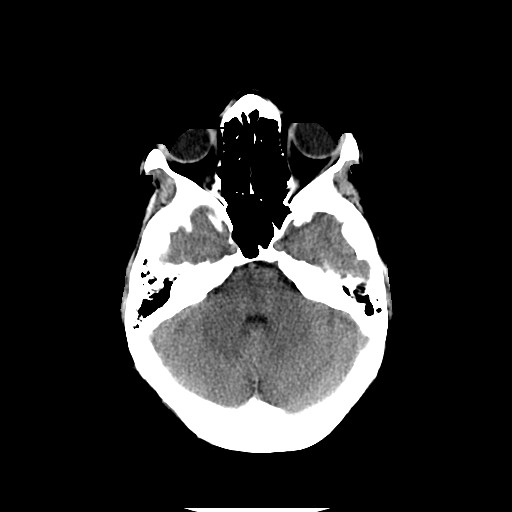
[im 12/32  brain]
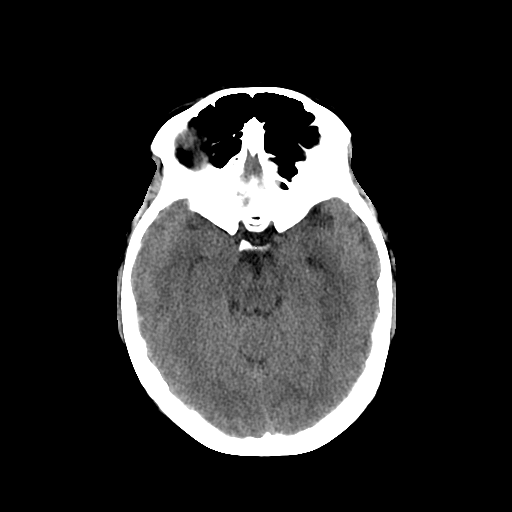
[im 12/32  bone]
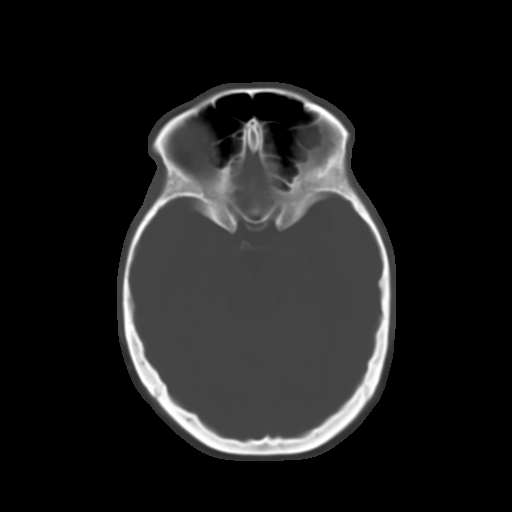
[im 14/32  brain]
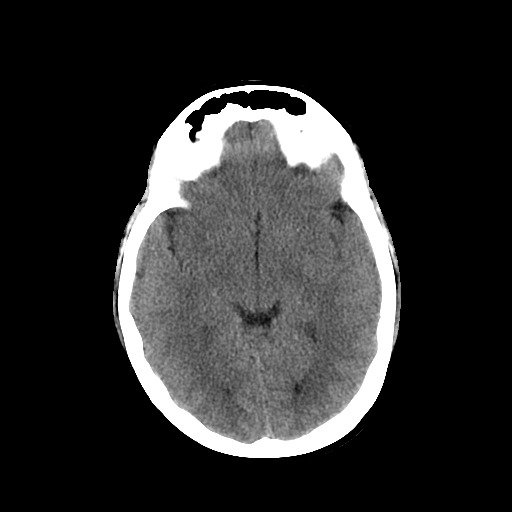
[im 16/32  brain]
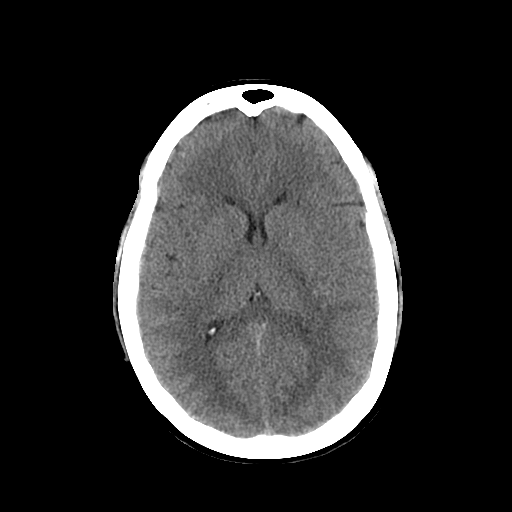
[im 18/32  brain]
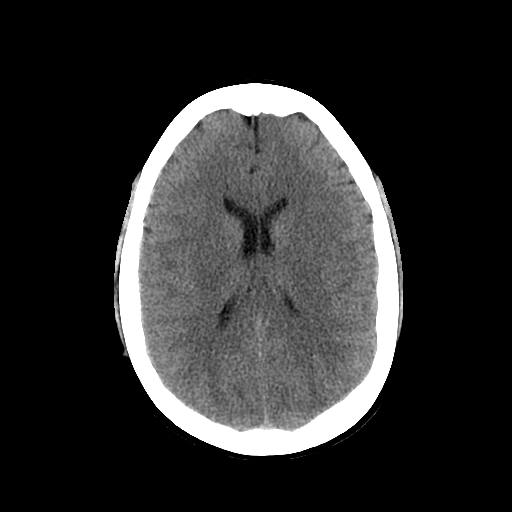
[im 20/32  brain]
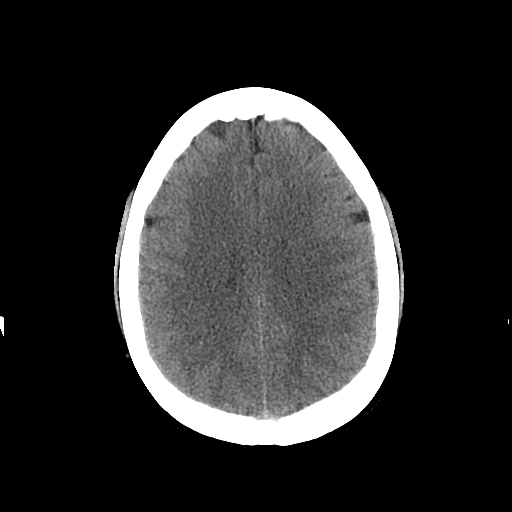
[im 20/32  bone]
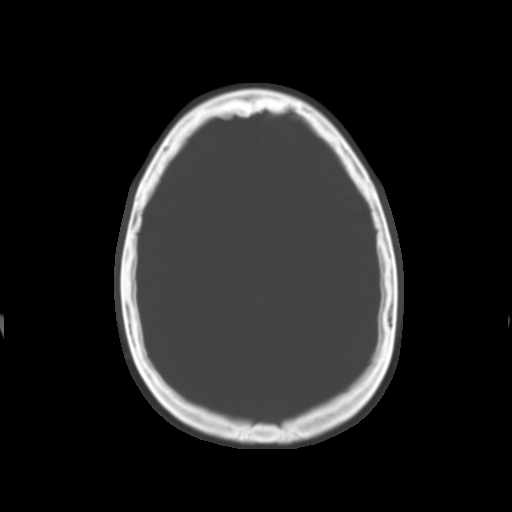
[im 23/32  brain]
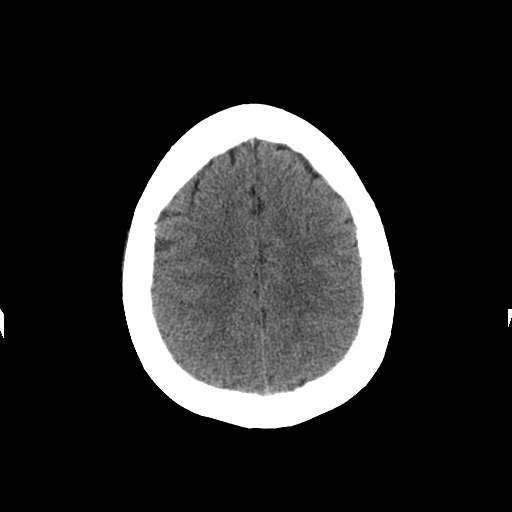
[im 25/32  brain]
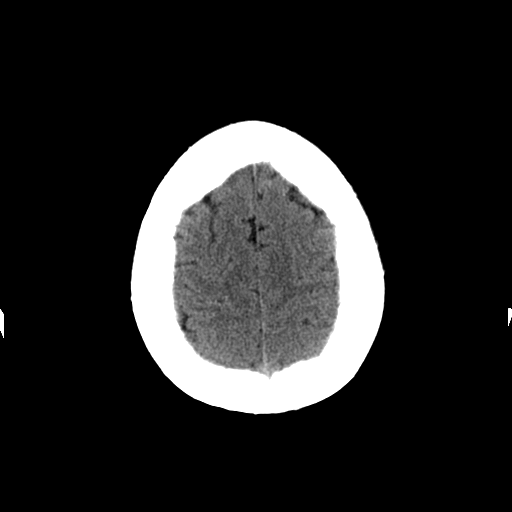
[im 27/32  brain]
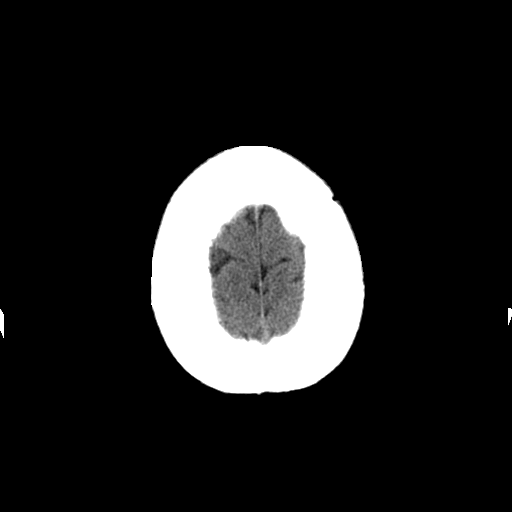
[im 29/32  brain]
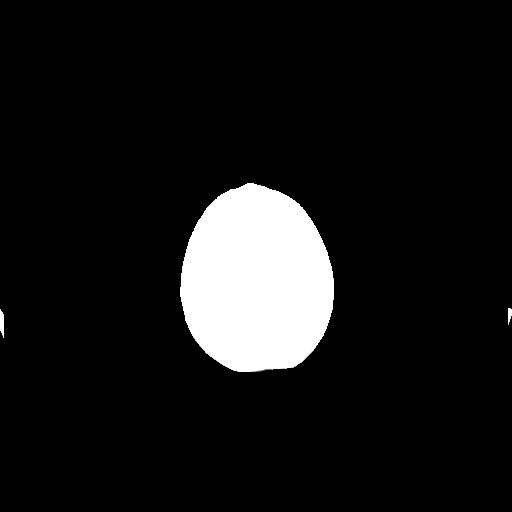
[im 29/32  bone]
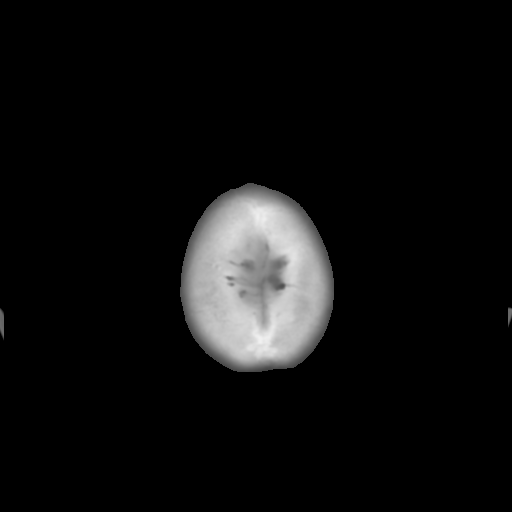

[Series 3: head w/o bone · axial · non-contrast · 0.49mm/px · z∈[+60,+106]mm · 3 of 32 slices shown]
[im 3/32  bone]
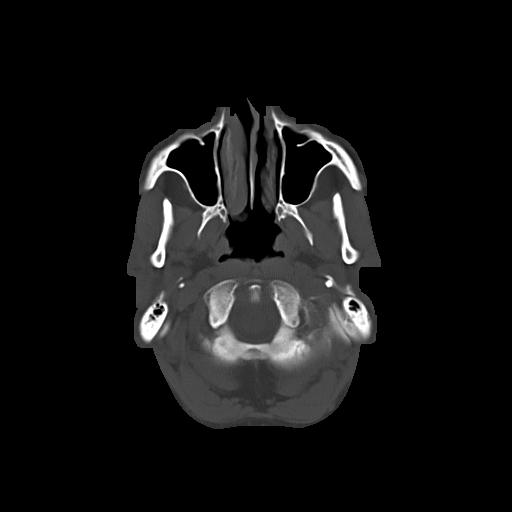
[im 7/32  bone]
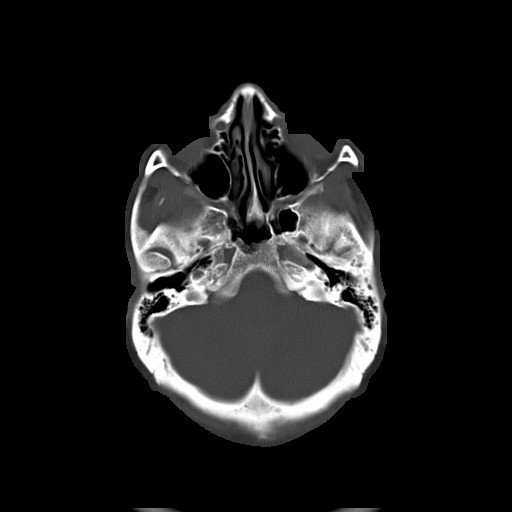
[im 12/32  bone]
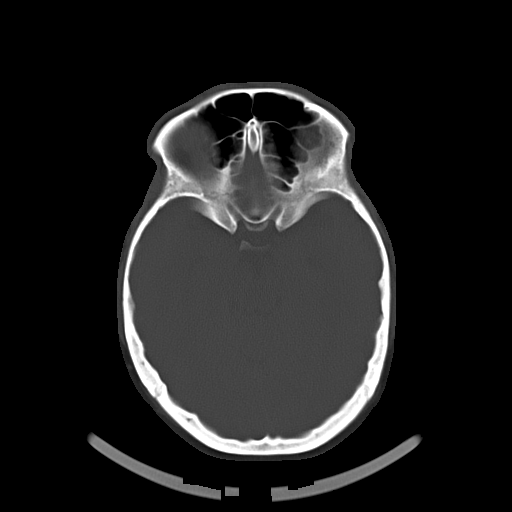

[16 of 30 positions shown; findings below may reference images not displayed]

FINDINGS: The brain has a normal appearance without evidence for
hemorrhage, acute infarction, hydrocephalus, or mass lesion.  There
is no extra axial fluid collection.  The skull and paranasal
sinuses are normal.
IMPRESSION: Normal CT of the head without contrast.

## 2013-05-13 MED ORDER — OXYCODONE HCL 5 MG PO TABS: 5.0000 mg | ORAL_TABLET | Freq: Four times a day (QID) | ORAL | Status: DC | PRN

## 2013-05-13 MED ORDER — OXYCODONE HCL 5 MG PO TABS
5.0000 mg | ORAL_TABLET | Freq: Four times a day (QID) | ORAL | Status: DC | PRN
  Administered 2013-05-13: 5 mg via ORAL
  Filled 2013-05-13: qty 1

## 2013-05-13 NOTE — ED Provider Notes (Signed)
Medical screening examination/treatment/procedure(s) were conducted as a shared visit with non-physician practitioner(s) and myself.  I personally evaluated the patient during the encounter   Patient with headache starting 2 days ago, initially improved but now has gradually worsened and is not controlled with her usual medicines. CT obtained to help r/o SAH as she had an acute onset 2 days ago, but patient declines LP and understands that due to her delayed onset we cannot r/o SAH. She has improved with meds here and elects to f/u w/ PCP instead. Otherwise her headache is not c/w meningitis, tumor, venous thrombosis, or other acute, emergent causes of headache.  Audree Camel, MD 05/13/13 [phone]

## 2013-05-13 NOTE — ED Notes (Signed)
Pt has ride home.

## 2013-06-13 ENCOUNTER — Other Ambulatory Visit (HOSPITAL_COMMUNITY)
Admission: RE | Admit: 2013-06-13 | Discharge: 2013-06-13 | Disposition: A | Discharge status: Home / Self Care | Payer: Private Health Insurance - Indemnity | Source: Ambulatory Visit | Attending: Family Medicine | Admitting: Family Medicine

## 2013-06-13 ENCOUNTER — Other Ambulatory Visit: Payer: Self-pay | Admitting: Family Medicine

## 2013-06-13 DIAGNOSIS — Z0000 Encounter for general adult medical examination without abnormal findings: Secondary | ICD-10-CM | POA: Insufficient documentation

## 2013-06-15 ENCOUNTER — Other Ambulatory Visit: Payer: Self-pay | Admitting: Family Medicine

## 2013-06-15 DIAGNOSIS — N644 Mastodynia: Secondary | ICD-10-CM

## 2013-06-29 ENCOUNTER — Other Ambulatory Visit: Payer: Self-pay | Admitting: Family Medicine

## 2013-06-29 ENCOUNTER — Ambulatory Visit
Admission: RE | Admit: 2013-06-29 | Discharge: 2013-06-29 | Disposition: A | Discharge status: Home / Self Care | Payer: Private Health Insurance - Indemnity | Source: Ambulatory Visit | Attending: Family Medicine | Admitting: Family Medicine

## 2013-06-29 DIAGNOSIS — N644 Mastodynia: Secondary | ICD-10-CM

## 2013-06-29 IMAGING — MG MM DIGITAL DIAGNOSTIC BILAT CAD
8 of 11 series · 8 of 11 positions shown · non-contrast
Comparison: 04/12/2009

ACR Breast Density Category c: The breast tissue is heterogeneously
dense.

CLINICAL DATA: Bilateral breast pain.

EXAM:
DIGITAL DIAGNOSTIC BILATERALMAMMOGRAM with CAD
ULTRASOUND BILATERAL BREAST

[R CC (1 of 3)]
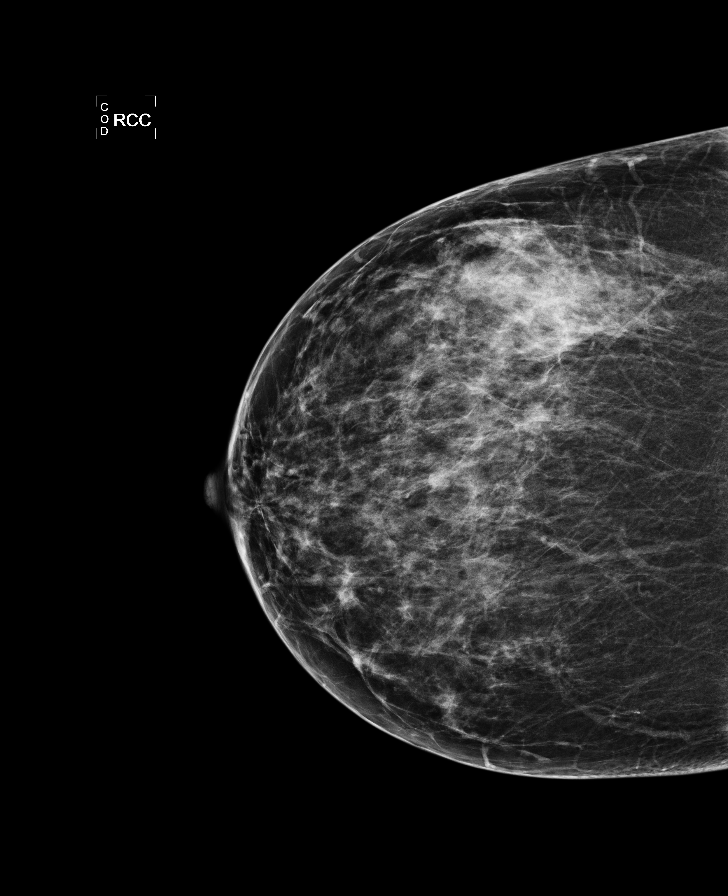

[L CC]
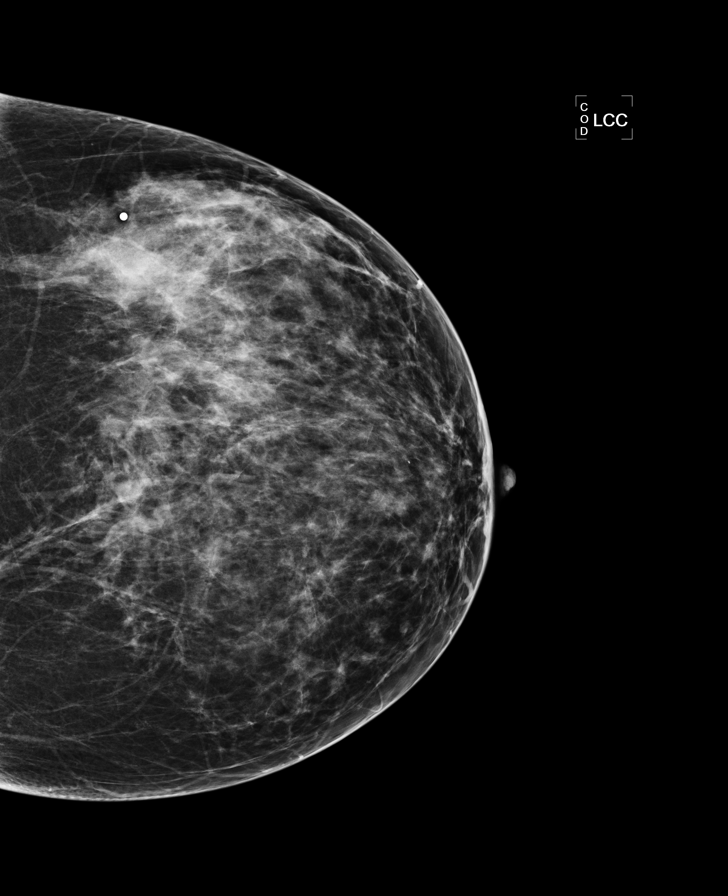

[L MLO]
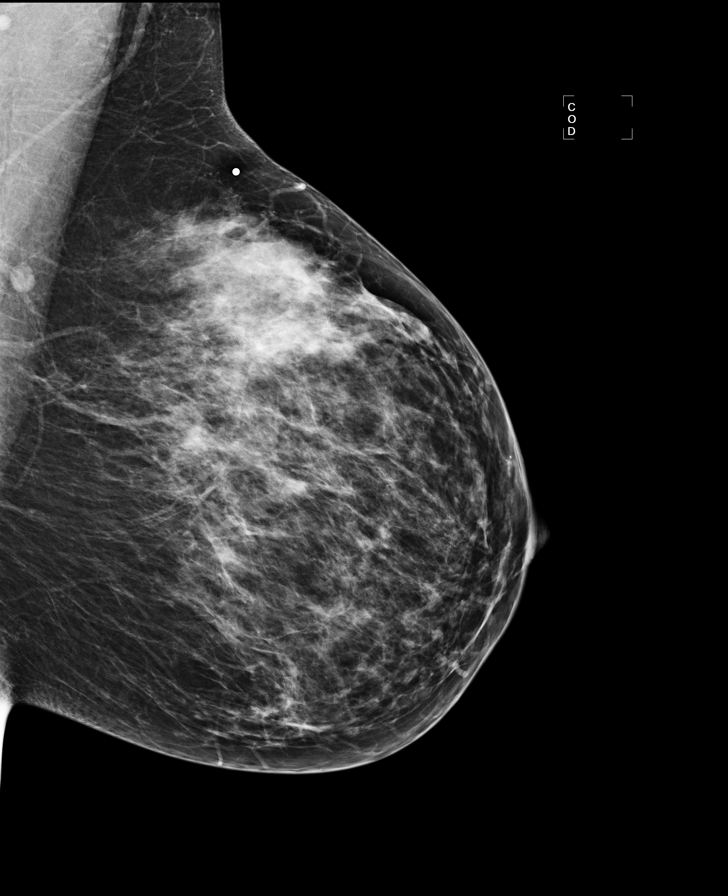

[R MLO]
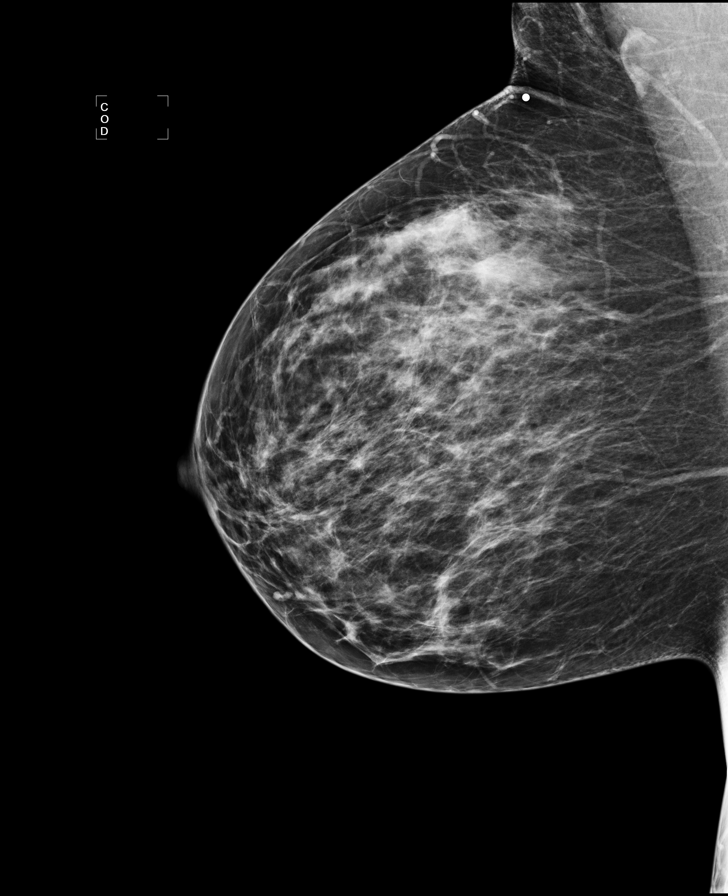

[L TAN]
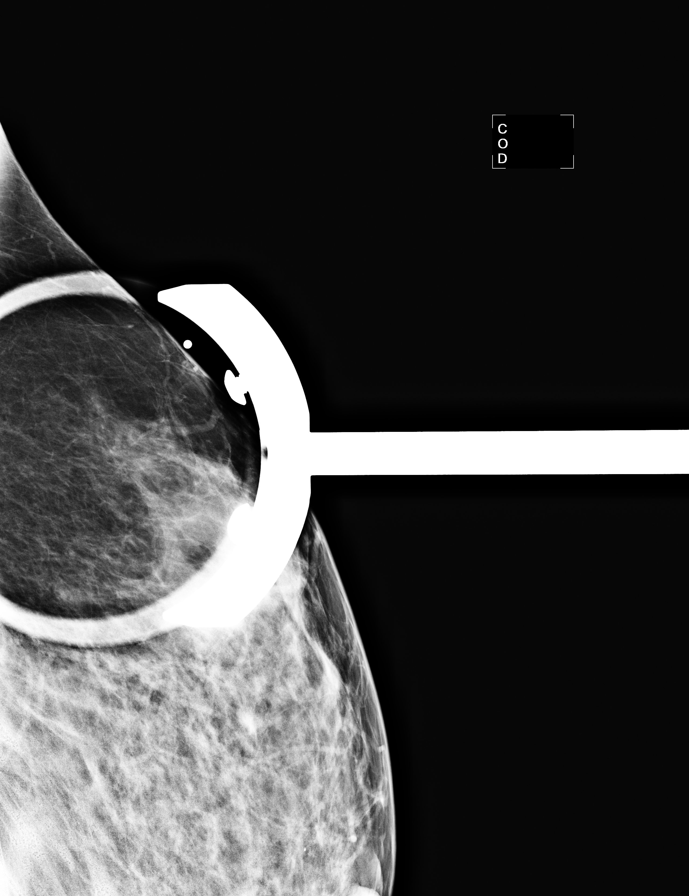

[R CC (2 of 3)]
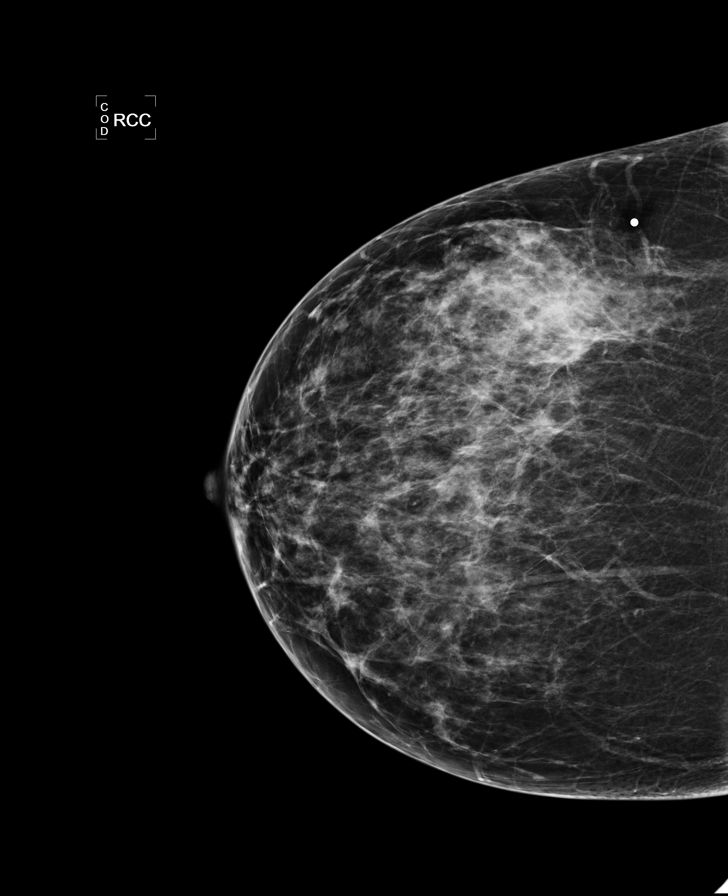

[R TAN]
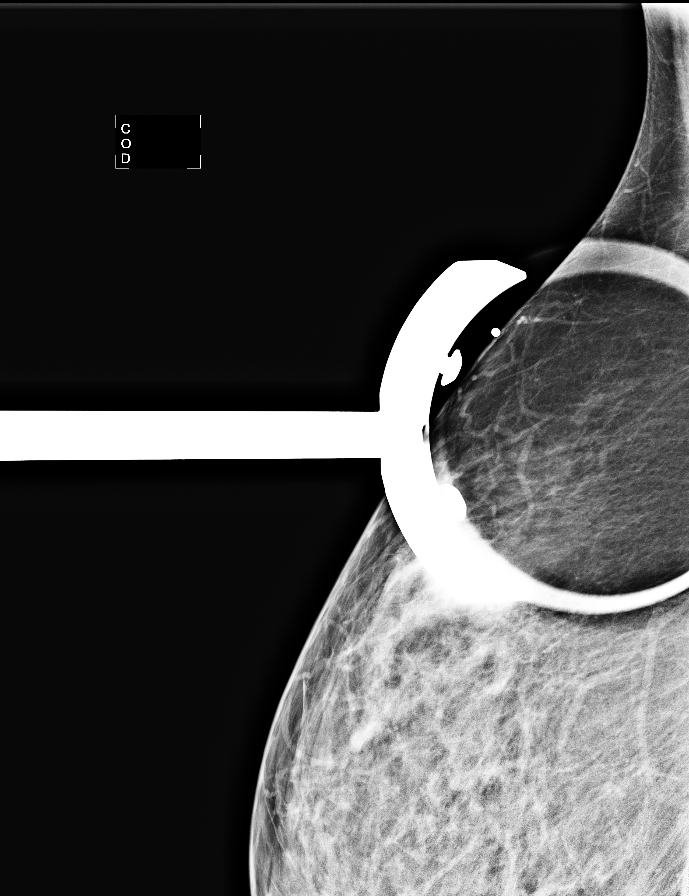

[R CC (3 of 3)]
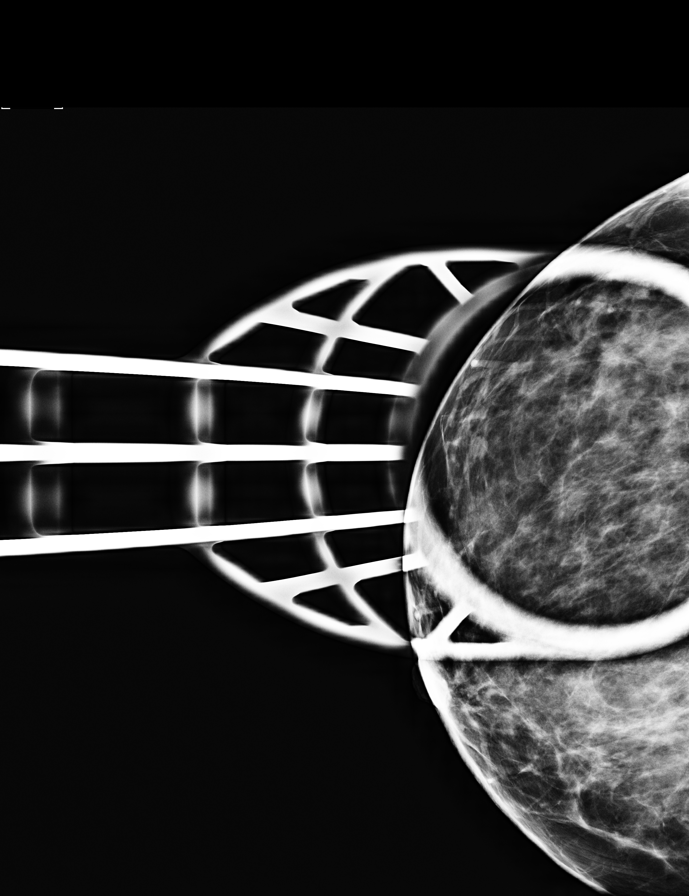

[8 of 11 positions shown; findings below may reference images not displayed]

FINDINGS: Standard CC and MLO views of both breasts were obtained in addition
to spot compression views bilaterally. 2 new circumscribed isodense
masses are identified in the right breast on the CC view. One of
these masses is seen in the anterior outer right breast and measures
up to 5 mm. The 2nd mass is seen in the middle 3rd of the right
breast in a retroareolar position and measures 7 mm. Given the
positions on the medially rolled CC view, these masses are in the
upper breast.

Mammographic images were processed with CAD.

On physical exam no palpable abnormality is identified in the area
of focal pain at 2 o'clock in the left breast. No palpable
abnormality is identified in the upper-outer right breast.

Targeted ultrasound of the left breast in the area of patient pain
is negative. Targeted ultrasound of the right breast demonstrates an
anechoic cyst at 11 o'clock, 5 cm from the nipple, measuring 5 x 4 x
5 mm. This corresponds with the mammographic abnormality. In the
right breast at 9 o'clock, 8 cm from the nipple, there is a 6 x 2 x
6 mm hypoechoic circumscribed mass the with posterior acoustic
enhancement. No internal vascularity is demonstrated. This
corresponds with mammographic abnormality.
IMPRESSION: 1. Probably benign subcentimeter right breast mass, likely
representing a complicated cyst.
2. Right breast cyst.

RECOMMENDATION:
Options of aspiration versus followup of the patient's probably
benign right breast mass were discussed. The patient prefers follow
up. A 6 month right breast ultrasound is recommended.

I have discussed the findings and recommendations with the patient.
Results were also provided in writing at the conclusion of the
visit. If applicable, a reminder letter will be sent to the patient
regarding the next appointment.

BI-RADS CATEGORY  3: Probably benign finding(s) - short interval
follow-up suggested.

## 2013-06-29 IMAGING — US US BREAST BILAT
1 series · 8 of 8 positions shown · non-contrast
Comparison: 04/12/2009

ACR Breast Density Category c: The breast tissue is heterogeneously
dense.

CLINICAL DATA: Bilateral breast pain.

EXAM:
DIGITAL DIAGNOSTIC BILATERALMAMMOGRAM with CAD
ULTRASOUND BILATERAL BREAST

[Series 1: us breast bilat · 8 of 8 slices shown]
[im 1/8]
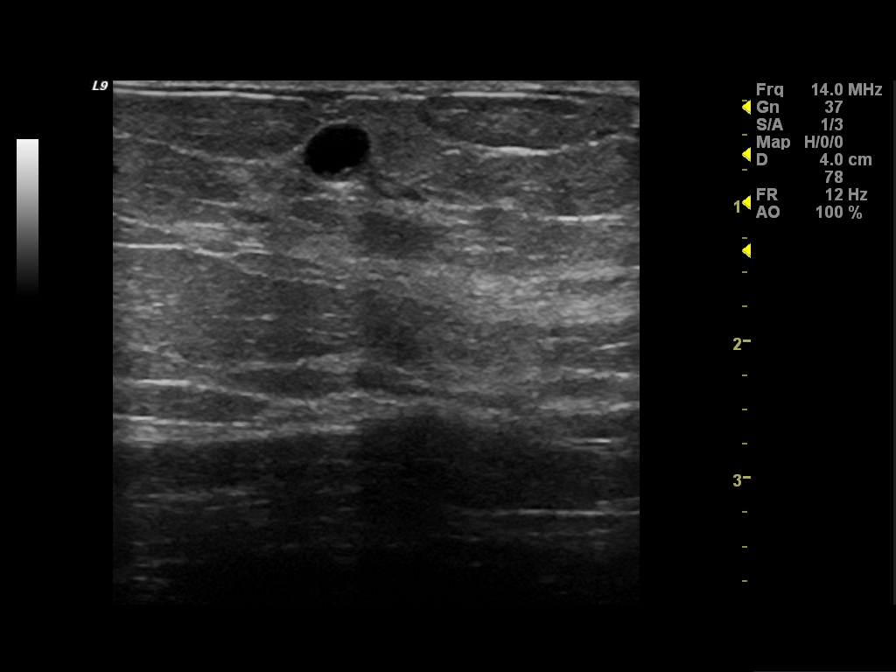
[im 2/8]
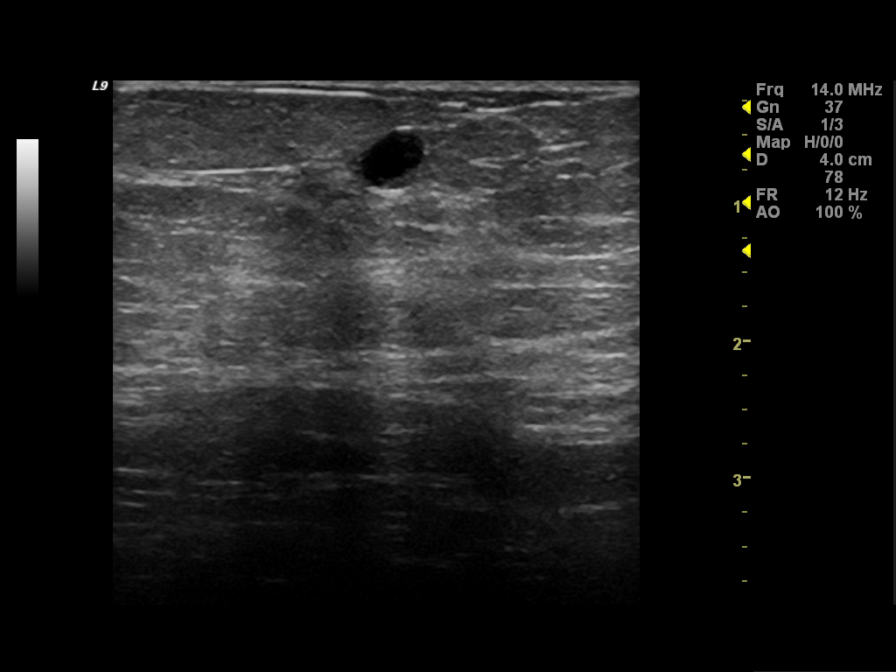
[im 3/8]
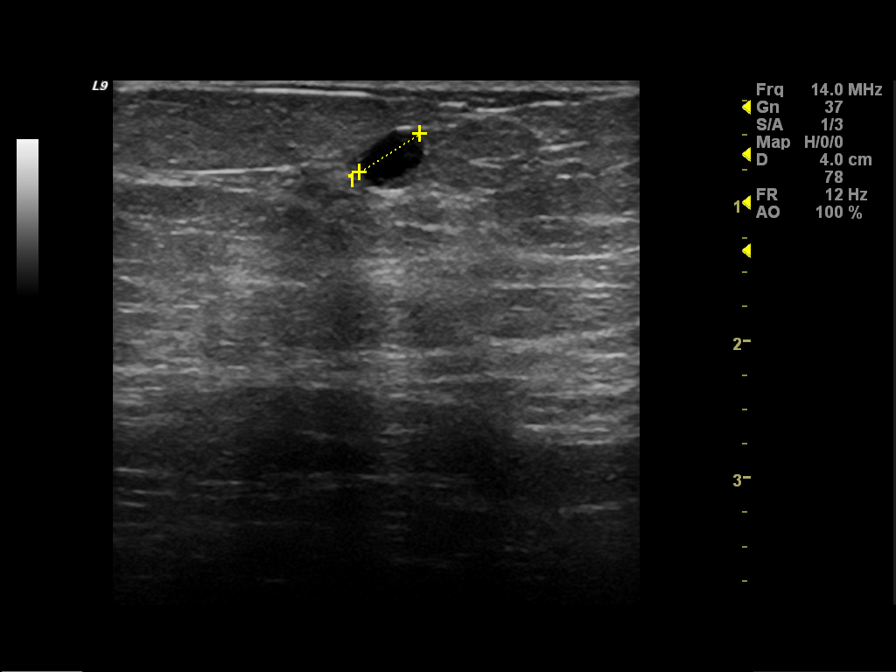
[im 4/8]
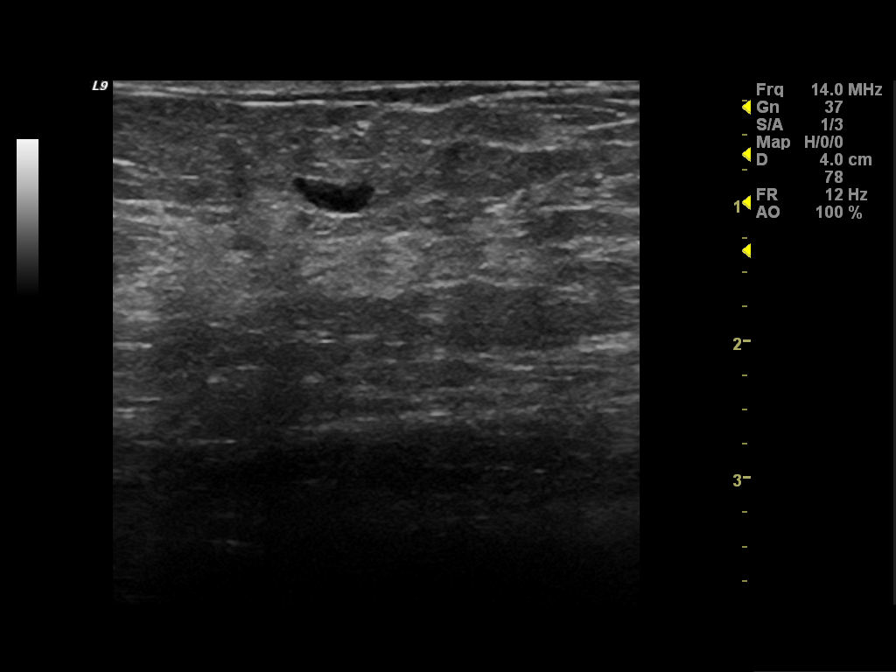
[im 5/8]
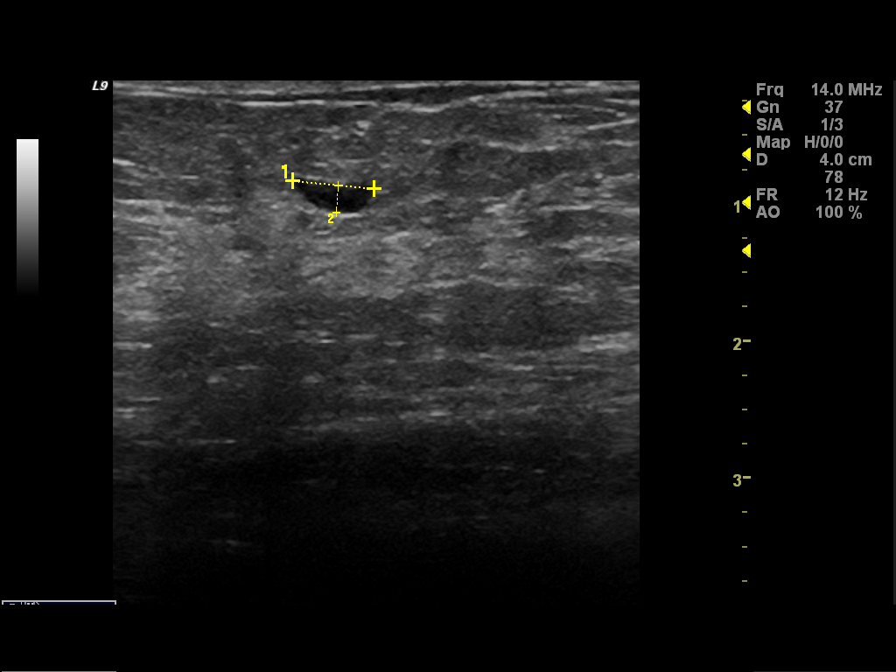
[im 6/8]
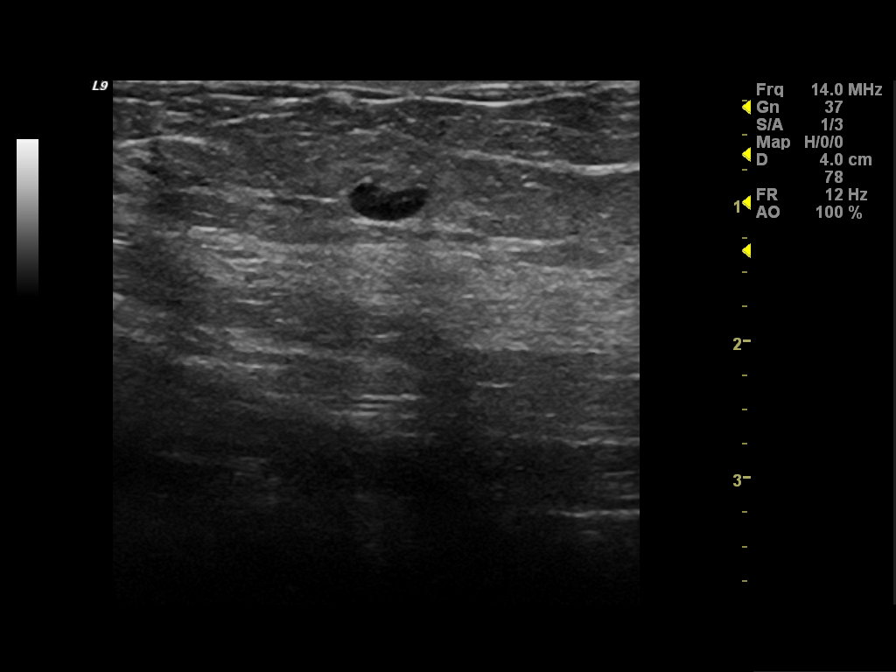
[im 7/8]
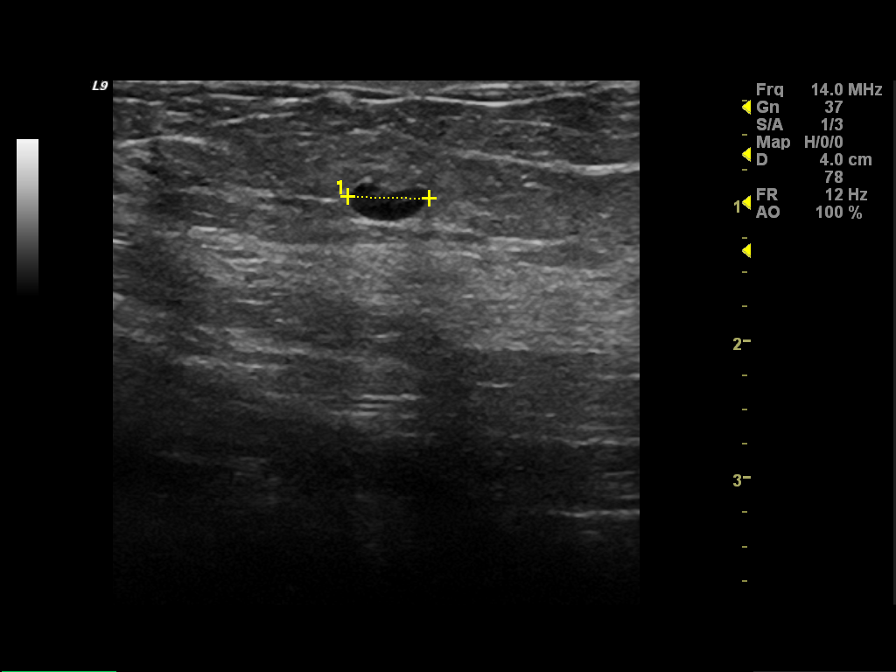
[im 8/8]
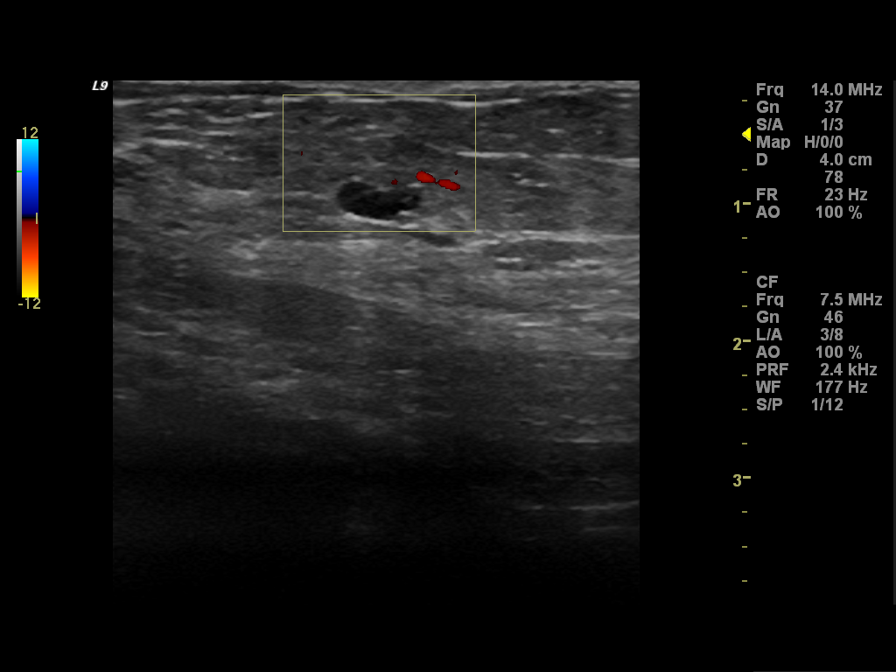

[8 of 8 positions shown; findings below may reference images not displayed]

FINDINGS: Standard CC and MLO views of both breasts were obtained in addition
to spot compression views bilaterally. 2 new circumscribed isodense
masses are identified in the right breast on the CC view. One of
these masses is seen in the anterior outer right breast and measures
up to 5 mm. The 2nd mass is seen in the middle 3rd of the right
breast in a retroareolar position and measures 7 mm. Given the
positions on the medially rolled CC view, these masses are in the
upper breast.

Mammographic images were processed with CAD.

On physical exam no palpable abnormality is identified in the area
of focal pain at 2 o'clock in the left breast. No palpable
abnormality is identified in the upper-outer right breast.

Targeted ultrasound of the left breast in the area of patient pain
is negative. Targeted ultrasound of the right breast demonstrates an
anechoic cyst at 11 o'clock, 5 cm from the nipple, measuring 5 x 4 x
5 mm. This corresponds with the mammographic abnormality. In the
right breast at 9 o'clock, 8 cm from the nipple, there is a 6 x 2 x
6 mm hypoechoic circumscribed mass the with posterior acoustic
enhancement. No internal vascularity is demonstrated. This
corresponds with mammographic abnormality.
IMPRESSION: 1. Probably benign subcentimeter right breast mass, likely
representing a complicated cyst.
2. Right breast cyst.

RECOMMENDATION:
Options of aspiration versus followup of the patient's probably
benign right breast mass were discussed. The patient prefers follow
up. A 6 month right breast ultrasound is recommended.

I have discussed the findings and recommendations with the patient.
Results were also provided in writing at the conclusion of the
visit. If applicable, a reminder letter will be sent to the patient
regarding the next appointment.

BI-RADS CATEGORY  3: Probably benign finding(s) - short interval
follow-up suggested.

## 2013-08-29 ENCOUNTER — Encounter (INDEPENDENT_AMBULATORY_CARE_PROVIDER_SITE_OTHER): Payer: Self-pay

## 2013-08-29 NOTE — Progress Notes (Signed)
Rec'd fax request from [facility] for refill on Ondansetron 4mg  tablet.  Refill aprroved and faxed back to [phone].  Fax confirmation rec'd.

## 2014-02-21 ENCOUNTER — Ambulatory Visit (INDEPENDENT_AMBULATORY_CARE_PROVIDER_SITE_OTHER): Payer: Private Health Insurance - Indemnity | Admitting: Endocrinology

## 2014-02-21 ENCOUNTER — Encounter: Payer: Self-pay | Admitting: Endocrinology

## 2014-02-21 ENCOUNTER — Encounter: Payer: Private Health Insurance - Indemnity | Attending: Endocrinology | Admitting: Nutrition

## 2014-02-21 VITALS — BP 118/76 | HR 85 | Temp 99.0°F | Ht 63.0 in | Wt 192.0 lb

## 2014-02-21 DIAGNOSIS — Z713 Dietary counseling and surveillance: Secondary | ICD-10-CM | POA: Insufficient documentation

## 2014-02-21 DIAGNOSIS — E119 Type 2 diabetes mellitus without complications: Secondary | ICD-10-CM | POA: Insufficient documentation

## 2014-02-21 DIAGNOSIS — Z9884 Bariatric surgery status: Secondary | ICD-10-CM

## 2014-02-21 DIAGNOSIS — R51 Headache: Secondary | ICD-10-CM

## 2014-02-21 MED ORDER — ACARBOSE 25 MG PO TABS: 25.0000 mg | ORAL_TABLET | Freq: Three times a day (TID) | ORAL | Status: DC

## 2014-02-21 NOTE — Patient Instructions (Signed)
i have sent a prescription to your pharmacy, for "acarbose."  Side-effects are abdominal bloating, gas, and loose bowels.  If this happens, start with just 1 pill per day, then slowly increase. Please come back for a follow-up appointment in 3 months.

## 2014-02-21 NOTE — Progress Notes (Signed)
Subjective:    Patient ID: Belinda LevanSusan D [name], female    DOB: 1976-09-14, 38 y.o.   MRN: [id number]  HPI pt states DM was dx'ed in 2011; she has mild if any neuropathy of the lower extremities; she is unaware of any associated chronic complications.  she has never been on insulin.  She has gastric bypass surgery in 2013.  She lost 85 lbs since then, and has not needed oral meds since then.  pt says her diet is "ok," but exercise is not very good.  He has never had GDM, pancreatitis, or DKA.  She now reports 3 mos of severe hypoglycemic episodes.  She has had assoc syncope, as evidenced by a knee injury suffered on 1 of these occasions.  On several occasions, she has checked cbg, and found it to be in the 60's or 70's.  The episodes usually happen 1-3 hrs after eating.  She is considering a pregnancy.  30-minute postprandial cbg's vary from 140-260.   Past Medical History  Diagnosis Date  . GERD (gastroesophageal reflux disease)   . Substance abuse   . Sleep apnea   . Headache(784.0)   . Diabetes mellitus     type 2 -  diet controlled  . Bipolar affective disorder     Past Surgical History  Procedure Laterality Date  . Septoplasty  2003  . 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 Esophagogastroduodenoscopy  08/05/2012    Procedure: ESOPHAGOGASTRODUODENOSCOPY (EGD);  Surgeon: Kandis Cockingavid H Newman, MD;  [facility];  Service: General;  Laterality: N/A;    History   Social History  . Marital Status: Married    Spouse Name: N/A    Number of Children: N/A  . Years of Education: N/A   Occupational History  . Not on file.   Social History Main Topics  . Smoking status: Never Smoker   . Smokeless tobacco: Never Used  . Alcohol Use: No  . Drug Use: No  . Sexual Activity: Not on file   Other Topics Concern  . Not on file   Social  History Narrative  . No narrative on file    Current Outpatient Prescriptions on File Prior to Visit  Medication Sig Dispense Refill  . CALCIUM PO Take 1 tablet by mouth at bedtime.       . ondansetron (ZOFRAN) 4 MG tablet Take 1 tablet (4 mg total) by mouth every 8 (eight) hours as needed for nausea.  20 tablet  0  . ABILIFY 15 MG tablet Take 22.5 mg by mouth at bedtime.       Marland Kitchen. buPROPion (WELLBUTRIN XL) 150 MG 24 hr tablet Take 450 mg by mouth at bedtime.      Marland Kitchen. etonogestrel-ethinyl estradiol (NUVARING) 0.12-0.015 MG/24HR vaginal ring Place 1 each vaginally every 28 (twenty-eight) days. Insert vaginally and leave in place for 3 consecutive weeks, then remove for 1 week.      . lamoTRIgine (LAMICTAL) 200 MG tablet Take 200 mg by mouth at bedtime.      . norgestimate-ethinyl estradiol (ORTHO-CYCLEN,SPRINTEC,PREVIFEM) 0.25-35 MG-MCG tablet Take 1 tablet by mouth daily.      Marland Kitchen. oxyCODONE (ROXICODONE) 5 MG immediate release tablet Take 1 tablet (5 mg total) by mouth every 6 (six) hours as needed for pain.  12 tablet  0  . rizatriptan (MAXALT) 10 MG tablet Take  10 mg by mouth every 2 (two) hours as needed for migraine.       . sertraline (ZOLOFT) 100 MG tablet Take 100 mg by mouth at bedtime.       No current facility-administered medications on file prior to visit.    Allergies  Allergen Reactions  . Acetaminophen Nausea Only    Family History  Problem Relation Age of Onset  . Cancer Mother 5162    brain, skin  . Cancer Father 3060    kidney, bone, rectal  . Cancer Paternal Aunt     breast    BP 118/76  Pulse 85  Temp(Src) 99 F (37.2 C) (Oral)  Ht 5\' 3"  (1.6 m)  Wt 192 lb (87.091 kg)  BMI 34.02 kg/m2  SpO2 98%   Review of Systems She denies diarrhea. She has fatigue since she stopped bipolar meds in 2014. denies recent weight change, headache, fever, rash, visual loss, sob, depression, urinary frequency, arthralgias, galactorrhea, excessive diaphoresis, muscle weakness,  rhinorrhea, easy bruising, and numbness.  She has postprandial bloating. Menses are normal.      Objective:   Physical Exam VS: see vs page GEN: no distress HEAD: head: no deformity eyes: no periorbital swelling, no proptosis external nose and ears are normal mouth: no lesion seen NECK: supple, thyroid is not enlarged CHEST WALL: no deformity LUNGS:  Clear to auscultation CV: reg rate and rhythm, no murmur ABD: abdomen is soft, nontender.  no hepatosplenomegaly.  not distended.  no hernia MUSCULOSKELETAL: muscle bulk and strength are grossly normal.  no obvious joint swelling.  gait is normal and steady EXTEMITIES: no deformity.  no ulcer on the feet.  feet are of normal color and temp.  no edema PULSES: dorsalis pedis intact bilat.  no carotid bruit NEURO:  cn 2-12 grossly intact.   readily moves all 4's.  sensation is intact to touch on the feet SKIN:  Normal texture and temperature.  No rash or suspicious lesion is visible.   NODES:  None palpable at the neck PSYCH: alert, well-oriented.  Does not appear anxious nor depressed.   outside test results are reviewed: A1c=5.1% i have also eviewed the records in epic from other provider(s).    Assessment & Plan:  DM: much better with weight loss. Reactive hypoglycemia, new to me: she needs rx. Bariatric surgery status: this can contribute to hypoglycemia.   Patient Instructions  i have sent a prescription to your pharmacy, for "acarbose."  Side-effects are abdominal bloating, gas, and loose bowels.  If this happens, start with just 1 pill per day, then slowly increase. Please come back for a follow-up appointment in 3 months.

## 2014-02-22 NOTE — Patient Instructions (Signed)
Stop all sweet drinks, juices and cold cereal and milk. Add protein to every meal. Make sure there is a small serving of fat in each meal

## 2014-02-22 NOTE — Progress Notes (Signed)
Patient is here because she is not sure why she is having low blood sugar symptoms after eating. She has a history of gastric bypass surgery in 2013, and has lost 80 plus pounds. Since then, her diabetes has "gone away", and she is not longer needing any medication.  She is trying to lose more weight, but reports that "it just doesn't want to come off".  She is not exercising.  She has reduced her meals with very little protein, and has started to add a little more in the last 2-3 days.  She has had no low blood sugar reactions in the last 3 days. Discussed the idea that a balanced meal with protein and fat, will keep the blood sugar from rising quickly and from dropping low 2-4 hours after the meal. Examples of protein to use for each meal were given.   Also discussed the need to make sure that the carbs in each meal are complex, and higher in fiber.  This will prevent the blood sugar from rising quickly, that can result in overproduction of insulin in response to the rapid rise in blood sugar readings.  Reviewed the need to stop all sweet drinks, juices and cold cereal and milk. She agreed to do this.  It was suggested that she call the nutrition center upstairs for a more detailed review of her diet to include all of the above suggestions.  She agreed to do this.

## 2014-02-23 DIAGNOSIS — Z9884 Bariatric surgery status: Secondary | ICD-10-CM | POA: Insufficient documentation

## 2014-02-23 DIAGNOSIS — R519 Headache, unspecified: Secondary | ICD-10-CM | POA: Insufficient documentation

## 2014-02-23 DIAGNOSIS — R51 Headache: Secondary | ICD-10-CM

## 2014-05-19 ENCOUNTER — Ambulatory Visit (INDEPENDENT_AMBULATORY_CARE_PROVIDER_SITE_OTHER): Payer: Private Health Insurance - Indemnity | Admitting: Endocrinology

## 2014-05-19 ENCOUNTER — Encounter: Payer: Self-pay | Admitting: Endocrinology

## 2014-05-19 VITALS — BP 118/76 | HR 88 | Temp 98.0°F | Ht 63.0 in | Wt 192.0 lb

## 2014-05-19 DIAGNOSIS — E119 Type 2 diabetes mellitus without complications: Secondary | ICD-10-CM

## 2014-05-19 DIAGNOSIS — Z9884 Bariatric surgery status: Secondary | ICD-10-CM

## 2014-05-19 LAB — MICROALBUMIN / CREATININE URINE RATIO
CREATININE, U: 158.9 mg/dL
Microalb Creat Ratio: 0.1 mg/g (ref 0.0–30.0)
Microalb, Ur: 0.2 mg/dL (ref 0.0–1.9)

## 2014-05-19 LAB — HEMOGLOBIN A1C: Hgb A1c MFr Bld: 5.6 % (ref 4.6–6.5)

## 2014-05-19 MED ORDER — ACARBOSE 50 MG PO TABS: 50.0000 mg | ORAL_TABLET | Freq: Three times a day (TID) | ORAL | Status: DC

## 2014-05-19 NOTE — Patient Instructions (Addendum)
Please double the acarbose.  i have sent a prescription to your pharmacy. Please come back for a follow-up appointment in 3-6 months.   blood tests are being requested for you today.  We'll contact you with results.

## 2014-05-19 NOTE — Progress Notes (Signed)
Subjective:    Patient ID: Belinda [name], female    DOB: 04-26-1976, 38 y.o.   MRN: [id number]  HPI pt returns for f/u of type 2 DM and postprandial hypoglycemia, due to dumping (DM was dx'ed in 2011; she has mild if any neuropathy of the lower extremities; she is unaware of any associated chronic complications; she has never been on insulin.; she has gastric bypass surgery in 2013; she lost 85 lbs since then, and has not needed oral meds since then; she has never had GDM, pancreatitis, or DKA; she developed hypoglycemia in early 2015; she has had assoc syncope, as evidenced by a knee injury suffered on 1 of these occasions).  She continues to have mild hypoglycemia (50's), a few hrs after eating.   Pt says this is minimally if at all improved.  1-HR postprandial cbg's are improved to the mid-100's.   Past Medical History  Diagnosis Date  . GERD (gastroesophageal reflux disease)   . Substance abuse   . Sleep apnea   . Headache(784.0)   . Diabetes mellitus     type 2 -  diet controlled  . Bipolar affective disorder     Past Surgical History  Procedure Laterality Date  . Septoplasty  2003  . 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 Esophagogastroduodenoscopy  08/05/2012    Procedure: ESOPHAGOGASTRODUODENOSCOPY (EGD);  Surgeon: Kandis Cocking, MD;  Location: [facility];  Service: General;  Laterality: N/A;    History   Social History  . Marital Status: Married    Spouse Name: N/A    Number of Children: N/A  . Years of Education: N/A   Occupational History  . Not on file.   Social History Main Topics  . Smoking status: Never Smoker   . Smokeless tobacco: Never Used  . Alcohol Use: No  . Drug Use: No  . Sexual Activity: Not on file   Other Topics Concern  . Not on file   Social History Narrative  . No narrative on file     Current Outpatient Prescriptions on File Prior to Visit  Medication Sig Dispense Refill  . ABILIFY 15 MG tablet Take 22.5 mg by mouth at bedtime.       Marland Kitchen buPROPion (WELLBUTRIN XL) 150 MG 24 hr tablet Take 450 mg by mouth at bedtime.      Marland Kitchen CALCIUM PO Take 1 tablet by mouth at bedtime.       Marland Kitchen etonogestrel-ethinyl estradiol (NUVARING) 0.12-0.015 MG/24HR vaginal ring Place 1 each vaginally every 28 (twenty-eight) days. Insert vaginally and leave in place for 3 consecutive weeks, then remove for 1 week.      . lamoTRIgine (LAMICTAL) 200 MG tablet Take 200 mg by mouth at bedtime.      . norgestimate-ethinyl estradiol (ORTHO-CYCLEN,SPRINTEC,PREVIFEM) 0.25-35 MG-MCG tablet Take 1 tablet by mouth daily.      . ondansetron (ZOFRAN) 4 MG tablet Take 1 tablet (4 mg total) by mouth every 8 (eight) hours as needed for nausea.  20 tablet  0  . oxyCODONE (ROXICODONE) 5 MG immediate release tablet Take 1 tablet (5 mg total) by mouth every 6 (six) hours as needed for pain.  12 tablet  0  . rizatriptan (MAXALT) 10 MG tablet Take 10 mg by mouth every 2 (two) hours as needed for migraine.       Marland Kitchen  sertraline (ZOLOFT) 100 MG tablet Take 100 mg by mouth at bedtime.       No current facility-administered medications on file prior to visit.    Allergies  Allergen Reactions  . Acetaminophen Nausea Only    Family History  Problem Relation Age of Onset  . Cancer Mother 262    brain, skin  . Cancer Father 5460    kidney, bone, rectal  . Cancer Paternal Aunt     breast  . Diabetes Mother   . Diabetes Brother     BP 118/76  Pulse 88  Temp(Src) 98 F (36.7 C) (Oral)  Ht 5\' 3"  (1.6 m)  Wt 192 lb (87.091 kg)  BMI 34.02 kg/m2  SpO2 96%   Review of Systems Denies weight change and diarrhea.      Objective:   Physical Exam VITAL SIGNS:  See vs page GENERAL: no distress Pulses: dorsalis pedis intact bilat.   Feet: no deformity. normal color and temp.  no edema Skin:  no ulcer on the feet.   Neuro:  sensation is intact to touch on the feet   Lab Results  Component Value Date   HGBA1C 5.8* 07/17/2011       Assessment & Plan:  DM: well-controlled Morbid obesity: much better after surgery.  Pt encouraged to continue dietary efforts. Postprandial hypoglycemia: not improved.  Patient is advised the following: Patient Instructions  Please double the acarbose.  i have sent a prescription to your pharmacy. Please come back for a follow-up appointment in 3-6 months.   blood tests are being requested for you today.  We'll contact you with results.

## 2014-05-22 LAB — PTH, INTACT AND CALCIUM
CALCIUM: 9 mg/dL (ref 8.4–10.5)
PTH: 65.2 pg/mL (ref 14.0–72.0)

## 2014-05-22 LAB — VITAMIN D 25 HYDROXY (VIT D DEFICIENCY, FRACTURES): VITD: 33.37 ng/mL (ref 30.00–100.00)

## 2014-05-22 LAB — VITAMIN B12: Vitamin B-12: 195 pg/mL — ABNORMAL LOW (ref 211–911)

## 2014-05-24 ENCOUNTER — Telehealth: Payer: Self-pay | Admitting: Endocrinology

## 2014-05-24 NOTE — Telephone Encounter (Signed)
See below and please advise, Thanks!  

## 2014-05-24 NOTE — Telephone Encounter (Signed)
Patient would like to know if Dr Everardo Allellison could prescribe her the Vit B- 12 vials, she stated that she can give herself her own shots.

## 2014-05-24 NOTE — Telephone Encounter (Signed)
Please refer this request to pcp, as i need to drop back and rx just DM

## 2014-05-25 NOTE — Telephone Encounter (Signed)
Pt advised to ask pcp about B12. Pt states that she would.

## 2014-08-17 ENCOUNTER — Telehealth: Payer: Self-pay | Admitting: Endocrinology

## 2014-08-17 NOTE — Telephone Encounter (Signed)
Patient need and increased dosage of Acarbose to 100 mg she stated that it work better for her.

## 2014-08-18 NOTE — Telephone Encounter (Signed)
Ov is due.  Let's address then 

## 2014-08-18 NOTE — Telephone Encounter (Signed)
Lvom advising pt to call back and reschedule appointment.  

## 2014-08-18 NOTE — Telephone Encounter (Signed)
See below and please advise, Thanks!  

## 2014-11-29 ENCOUNTER — Ambulatory Visit: Payer: Private Health Insurance - Indemnity | Admitting: Endocrinology

## 2014-12-12 ENCOUNTER — Ambulatory Visit: Payer: Private Health Insurance - Indemnity | Admitting: Endocrinology

## 2015-05-22 ENCOUNTER — Other Ambulatory Visit: Payer: Self-pay | Admitting: Family Medicine

## 2015-05-22 NOTE — Telephone Encounter (Signed)
rx

## 2015-06-22 ENCOUNTER — Ambulatory Visit: Payer: Self-pay | Admitting: Family Medicine

## 2015-06-26 ENCOUNTER — Ambulatory Visit (INDEPENDENT_AMBULATORY_CARE_PROVIDER_SITE_OTHER): Payer: Private Health Insurance - Indemnity | Admitting: Family Medicine

## 2015-06-26 ENCOUNTER — Encounter: Payer: Self-pay | Admitting: Family Medicine

## 2015-06-26 VITALS — BP 104/70 | HR 81 | Temp 99.1°F | Ht 62.3 in | Wt 207.0 lb

## 2015-06-26 DIAGNOSIS — K289 Gastrojejunal ulcer, unspecified as acute or chronic, without hemorrhage or perforation: Secondary | ICD-10-CM | POA: Diagnosis not present

## 2015-06-26 DIAGNOSIS — E538 Deficiency of other specified B group vitamins: Secondary | ICD-10-CM

## 2015-06-26 DIAGNOSIS — E162 Hypoglycemia, unspecified: Secondary | ICD-10-CM

## 2015-06-26 DIAGNOSIS — E119 Type 2 diabetes mellitus without complications: Secondary | ICD-10-CM

## 2015-06-26 DIAGNOSIS — R110 Nausea: Secondary | ICD-10-CM | POA: Diagnosis not present

## 2015-06-26 LAB — CBC WITH DIFFERENTIAL/PLATELET
HEMOGLOBIN: 13.3 g/dL (ref 11.1–15.9)
Hematocrit: 38.9 % (ref 34.0–46.6)
Lymphocytes Absolute: 2.5 10*3/uL (ref 0.7–3.1)
Lymphs: 29 %
MCH: 31.9 pg (ref 26.6–33.0)
MCHC: 34.2 g/dL (ref 31.5–35.7)
MCV: 93 fL (ref 79–97)
MID (Absolute): 0.7 10*3/uL (ref 0.1–1.6)
MID: 9 %
NEUTROS ABS: 5.4 10*3/uL (ref 1.4–7.0)
NEUTROS PCT: 63 %
Platelets: 258 10*3/uL (ref 150–379)
RBC: 4.17 x10E6/uL (ref 3.77–5.28)
RDW: 13.3 % (ref 12.3–15.4)
WBC: 8.6 10*3/uL (ref 3.4–10.8)

## 2015-06-26 LAB — BAYER DCA HB A1C WAIVED: HB A1C: 5.6 % (ref <7.0)

## 2015-06-26 LAB — LIPID PANEL PICCOLO, WAIVED
CHOL/HDL RATIO PICCOLO,WAIVE: 2.1 mg/dL
Cholesterol Piccolo, Waived: 149 mg/dL (ref <200)
HDL Chol Piccolo, Waived: 71 mg/dL (ref >59)
LDL CHOL CALC PICCOLO WAIVED: 56 mg/dL (ref <100)
Triglycerides Piccolo,Waived: 109 mg/dL (ref <150)
VLDL Chol Calc Piccolo,Waive: 22 mg/dL (ref <30)

## 2015-06-26 LAB — MICROALBUMIN, URINE WAIVED
Creatinine, Urine Waived: 50 mg/dL (ref 10–300)
MICROALB, UR WAIVED: 10 mg/L (ref 0–19)

## 2015-06-26 LAB — GLUCOSE HEMOCUE WAIVED: GLU HEMOCUE WAIVED: 141 mg/dL — AB (ref 65–99)

## 2015-06-26 MED ORDER — ONDANSETRON HCL 4 MG PO TABS: 4.0000 mg | ORAL_TABLET | Freq: Three times a day (TID) | ORAL | Status: DC | PRN

## 2015-06-26 MED ORDER — BLOOD GLUCOSE MONITOR KIT: PACK | Status: DC

## 2015-06-26 MED ORDER — GLUCOSE BLOOD VI STRP: ORAL_STRIP | Status: DC

## 2015-06-26 MED ORDER — GLUCOSE 5 G PO CHEW: 15.0000 g | CHEWABLE_TABLET | ORAL | Status: DC | PRN

## 2015-06-26 NOTE — Progress Notes (Signed)
BP 104/70 mmHg  Pulse 81  Temp(Src) 99.1 F (37.3 C)  Ht 5' 2.3" (1.582 m)  Wt 207 lb (93.895 kg)  BMI 37.52 kg/m2  SpO2 99%  LMP  (LMP Unknown)   Subjective:    Patient ID: Belinda [name], female    DOB: 10/30/1975, 39 y.o.   MRN: [id number]  HPI: Belinda [name] is a 39 y.o. female  Chief Complaint  Patient presents with  . Diabetes  . vitamin b defiency   DIABETES Hypoglycemic episodes:yes- 1-2x per week, has gotten down to 30, has had heart fluttering. Has not seen endocrine since about a year ago. They thought it was from blood sugar spikes. She has been testing and doesn't notice spikes above 150-160. Would like a 2nd opinion.  Polydipsia/polyuria: no Visual disturbance: no Chest pain: no Paresthesias: no Glucose Monitoring: yes  Accucheck frequency: TID occasionally, some days not at all  Fasting glucose: down to 30, usually 50s  Post prandial: 150s-160s Taking Insulin?: no Blood Pressure Monitoring: not checking Retinal Examination: Not up to Date Foot Exam: Up to Date Diabetic Education: Completed Pneumovax: refused Influenza: refused Aspirin: no  ANEMIA- feeling tired, feeling like the B12 is off Anemia status: stable Etiology of anemia: Duration of anemia treatment: chronic Compliance with treatment: excellent compliance B12 supplementation side effects: no Severity of anemia: mild Fatigue: yes Decreased exercise tolerance: yes  Dyspnea on exertion: yes Palpitations: yes Bleeding: no Pica: no  Relevant past medical, surgical, family and social history reviewed and updated as indicated. Interim medical history since our last visit reviewed. Allergies and medications reviewed and updated.  Review of Systems  Constitutional: Negative.   Respiratory: Negative.   Cardiovascular: Negative.   Gastrointestinal: Negative.   Genitourinary: Negative.   Psychiatric/Behavioral: Negative.     Per HPI unless specifically indicated above      Objective:    BP 104/70 mmHg  Pulse 81  Temp(Src) 99.1 F (37.3 C)  Ht 5' 2.3" (1.582 m)  Wt 207 lb (93.895 kg)  BMI 37.52 kg/m2  SpO2 99%  LMP  (LMP Unknown)  Wt Readings from Last 3 Encounters:  06/26/15 207 lb (93.895 kg)  11/21/14 194 lb (87.998 kg)  05/19/14 192 lb (87.091 kg)    FSBG today 50- up to 141 following couple of sips of sweet tea  Physical Exam  Constitutional: She is oriented to person, place, and time. She appears well-developed and well-nourished. No distress.  HENT:  Head: Normocephalic and atraumatic.  Right Ear: Hearing normal.  Left Ear: Hearing normal.  Nose: Nose normal.  Eyes: Conjunctivae and lids are normal. Right eye exhibits no discharge. Left eye exhibits no discharge. No scleral icterus.  Cardiovascular: Normal rate, regular rhythm, normal heart sounds and intact distal pulses.  Exam reveals no gallop and no friction rub.   No murmur heard. Pulmonary/Chest: Effort normal and breath sounds normal. No respiratory distress. She has no wheezes. She has no rales. She exhibits no tenderness.  Musculoskeletal: Normal range of motion.  Neurological: She is alert and oriented to person, place, and time.  Skin: Skin is warm, dry and intact. No rash noted. No erythema. No pallor.  Psychiatric: She has a normal mood and affect. Her speech is normal and behavior is normal. Judgment and thought content normal. Cognition and memory are normal.  Nursing note and vitals reviewed.   Results for orders placed or performed in visit on 06/26/15  Bayer DCA Hb A1c Waived  Result Value Ref Range  Bayer DCA Hb A1c Waived 5.6 <7.0 %  Microalbumin, Urine Waived  Result Value Ref Range   Microalb, Ur Waived 10 0 - 19 mg/L   Creatinine, Urine Waived 50 10 - 300 mg/dL   Microalb/Creat Ratio <30 <30 mg/g  CBC With Differential/Platelet  Result Value Ref Range   WBC 8.6 3.4 - 10.8 x10E3/uL   RBC 4.17 3.77 - 5.28 x10E6/uL   Hemoglobin 13.3 11.1 - 15.9 g/dL    Hematocrit 38.9 34.0 - 46.6 %   MCV 93 79 - 97 fL   MCH 31.9 26.6 - 33.0 pg   MCHC 34.2 31.5 - 35.7 g/dL   RDW 13.3 12.3 - 15.4 %   Platelets 258 150 - 379 x10E3/uL   Neutrophils 63 %   Lymphs 29 %   MID 9 %   Neutrophils Absolute 5.4 1.4 - 7.0 x10E3/uL   Lymphocytes Absolute 2.5 0.7 - 3.1 x10E3/uL   MID (Absolute) 0.7 0.1 - 1.6 X10E3/uL  Lipid Panel Piccolo, Waived  Result Value Ref Range   Cholesterol Piccolo, Waived 149 <200 mg/dL   HDL Chol Piccolo, Waived 71 >59 mg/dL   Triglycerides Piccolo,Waived 109 <150 mg/dL   Chol/HDL Ratio Piccolo,Waive 2.1 mg/dL   LDL Chol Calc Piccolo Waived 56 <100 mg/dL   VLDL Chol Calc Piccolo,Waive 22 <30 mg/dL  Glucose Hemocue Waived  Result Value Ref Range   Glu Hemocue Waived 141 (H) 65 - 99 mg/dL      Assessment & Plan:   Problem List Items Addressed This Visit      Endocrine   Diabetes - Primary    Under good control on no meds with levels in the normal range, however, this seems to be due to hypoglycemia and spikes rather than steady control. Patient has passed out and is not happy with the answer she got previously. Will refer to different endocrine for 2nd opinion. Rx for glucagon given today in case she gets so low again. She didn't feel the 50 blood sugar at all.       Relevant Medications   glucose blood test strip   blood glucose meter kit and supplies KIT   glucose (B-D GLUCOSE) 5 G chewable tablet   Other Relevant Orders   Bayer DCA Hb A1c Waived (Completed)   Comprehensive metabolic panel   Microalbumin, Urine Waived (Completed)   CBC With Differential/Platelet (Completed)   Lipid Panel Piccolo, Waived (Completed)   Glucose Piccolo, Waived   Ambulatory referral to Endocrinology    Other Visit Diagnoses    Vitamin B 12 deficiency        Levels checked today.     Relevant Orders    B12    Folate    Hypoglycemia        See DM    Relevant Medications    glucose blood test strip    blood glucose meter kit and  supplies KIT    Other Relevant Orders    Ambulatory referral to Endocrinology    Nausea        Refill of zofran given. Continue to monitor.     Relevant Medications    ondansetron (ZOFRAN) 4 MG tablet    Marginal ulcer            Follow up plan: No Follow-up on file.

## 2015-06-26 NOTE — Assessment & Plan Note (Signed)
Under good control on no meds with levels in the normal range, however, this seems to be due to hypoglycemia and spikes rather than steady control. Patient has passed out and is not happy with the answer she got previously. Will refer to different endocrine for 2nd opinion. Rx for glucagon given today in case she gets so low again. She didn't feel the 50 blood sugar at all.

## 2015-06-27 ENCOUNTER — Encounter: Payer: Self-pay | Admitting: Family Medicine

## 2015-06-27 LAB — COMPREHENSIVE METABOLIC PANEL
ALK PHOS: 74 IU/L (ref 39–117)
ALT: 15 IU/L (ref 0–32)
AST: 16 IU/L (ref 0–40)
Albumin/Globulin Ratio: 2 (ref 1.1–2.5)
Albumin: 4.3 g/dL (ref 3.5–5.5)
BILIRUBIN TOTAL: 0.2 mg/dL (ref 0.0–1.2)
BUN / CREAT RATIO: 13 (ref 8–20)
BUN: 11 mg/dL (ref 6–20)
CHLORIDE: 102 mmol/L (ref 97–108)
CO2: 23 mmol/L (ref 18–29)
Calcium: 9.6 mg/dL (ref 8.7–10.2)
Creatinine, Ser: 0.87 mg/dL (ref 0.57–1.00)
GFR calc Af Amer: 97 mL/min/{1.73_m2} (ref >59)
GFR calc non Af Amer: 84 mL/min/{1.73_m2} (ref >59)
GLUCOSE: 42 mg/dL — AB (ref 65–99)
Globulin, Total: 2.2 g/dL (ref 1.5–4.5)
Potassium: 4.4 mmol/L (ref 3.5–5.2)
Sodium: 140 mmol/L (ref 134–144)
Total Protein: 6.5 g/dL (ref 6.0–8.5)

## 2015-06-27 LAB — FOLATE: FOLATE: 13.3 ng/mL (ref >3.0)

## 2015-06-27 LAB — VITAMIN B12: VITAMIN B 12: 593 pg/mL (ref 211–946)

## 2015-07-06 ENCOUNTER — Encounter: Payer: Private Health Insurance - Indemnity | Admitting: Family Medicine

## 2015-07-27 ENCOUNTER — Encounter: Payer: Private Health Insurance - Indemnity | Admitting: Family Medicine

## 2015-07-31 ENCOUNTER — Encounter: Payer: Managed Care, Other (non HMO) | Admitting: Family Medicine

## 2015-08-09 ENCOUNTER — Ambulatory Visit (INDEPENDENT_AMBULATORY_CARE_PROVIDER_SITE_OTHER): Payer: Managed Care, Other (non HMO) | Admitting: Family Medicine

## 2015-08-09 ENCOUNTER — Encounter: Payer: Self-pay | Admitting: Family Medicine

## 2015-08-09 VITALS — BP 109/75 | HR 73 | Temp 98.2°F | Ht 62.0 in | Wt 207.0 lb

## 2015-08-09 DIAGNOSIS — Z23 Encounter for immunization: Secondary | ICD-10-CM | POA: Diagnosis not present

## 2015-08-09 DIAGNOSIS — Z30018 Encounter for initial prescription of other contraceptives: Secondary | ICD-10-CM

## 2015-08-09 DIAGNOSIS — Z1239 Encounter for other screening for malignant neoplasm of breast: Secondary | ICD-10-CM | POA: Diagnosis not present

## 2015-08-09 DIAGNOSIS — Z0000 Encounter for general adult medical examination without abnormal findings: Secondary | ICD-10-CM

## 2015-08-09 NOTE — Progress Notes (Signed)
BP 109/75 mmHg  Pulse 73  Temp(Src) 98.2 F (36.8 C)  Ht 5' 2"  (1.575 m)  Wt 207 lb (93.895 kg)  BMI 37.85 kg/m2  SpO2 97%  LMP 08/04/2015   Subjective:    Patient ID: Belinda [name], female    DOB: 20-Jan-1976, 39 y.o.   MRN: [id number]  HPI: Belinda [name] is a 39 y.o. female presenting on 08/09/2015 for comprehensive medical examination. Current medical complaints include: hypoglycemic episodes seem to be happening much less.   CONTRACEPTION CONCERNS- was on the sprintec, but was having break-through, would like to have the implant Contraception: abstinance Average interval between menses: 2 weeks Length of menses: 2 days, then 4 days every month Flow: normal Dysmenorrhea: no  She currently lives with: spouse Menopausal Symptoms: no  Depression Screen done today and results listed below:  Depression screen [facility] 2/9 08/09/2015  Decreased Interest 0  Down, Depressed, Hopeless 0  PHQ - 2 Score 0    The patient does not have a history of falls. I did not complete a risk assessment for falls. A plan of care for falls was not documented.   Past Medical History:  Past Medical History  Diagnosis Date  . GERD (gastroesophageal reflux disease)   . Substance abuse   . Sleep apnea   . Headache(784.0)   . Diabetes mellitus     type 2 -  diet controlled  . Bipolar affective disorder ([place])   . Deviated septum   . Peptic ulcer     Surgical History:  Past Surgical History  Procedure Laterality Date  . Septoplasty  2003  . 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 Esophagogastroduodenoscopy  08/05/2012    Procedure: ESOPHAGOGASTRODUODENOSCOPY (EGD);  Surgeon: Shann Medal, MD;  Location: [facility];  Service: General;  Laterality: N/A;    Medications:  Current Outpatient Prescriptions on File Prior to Visit  Medication Sig   . ABILIFY 15 MG tablet Take 30 mg by mouth daily.   . blood glucose meter kit and supplies KIT Dispense based on patient and insurance preference. Use up to four times daily as directed. (FOR ICD-9 250.00, 250.01).  Marland Kitchen buPROPion (WELLBUTRIN XL) 150 MG 24 hr tablet Take 300 mg by mouth daily.   . cyanocobalamin (,VITAMIN B-12,) 1000 MCG/ML injection Inject 1,000 mcg into the muscle every 14 (fourteen) days.  Marland Kitchen glucose (B-D GLUCOSE) 5 G chewable tablet Chew 3 tablets (15 g total) by mouth as needed for low blood sugar.  Marland Kitchen glucose blood test strip Use as instructed  . lamoTRIgine (LAMICTAL) 200 MG tablet Take 200 mg by mouth at bedtime.  Donnetta Hail Estradiol (SPRINTEC 28 PO) Take 1 tablet by mouth daily.  . ondansetron (ZOFRAN) 4 MG tablet Take 1 tablet (4 mg total) by mouth every 8 (eight) hours as needed for nausea.  . sertraline (ZOLOFT) 100 MG tablet Take 100 mg by mouth at bedtime.  . zaleplon (SONATA) 10 MG capsule Take 10 mg by mouth at bedtime as needed for sleep.   No current facility-administered medications on file prior to visit.    Allergies:  Allergies  Allergen Reactions  . Depakote [Divalproex Sodium] Other (See Comments)    Tremors  . Acetaminophen Nausea Only    Social History:  Social History   Social History  . Marital Status: Married  Spouse Name: N/A  . Number of Children: N/A  . Years of Education: N/A   Occupational History  . Not on file.   Social History Main Topics  . Smoking status: Never Smoker   . Smokeless tobacco: Never Used  . Alcohol Use: No  . Drug Use: No  . Sexual Activity: Yes    Birth Control/ Protection: None   Other Topics Concern  . Not on file   Social History Narrative   History  Smoking status  . Never Smoker   Smokeless tobacco  . Never Used   History  Alcohol Use No    Family History:  Family History  Problem Relation Age of Onset  . Diabetes Mother   . Cancer Mother 75    brain, skin  . Cancer  Father 12    kidney, bone, rectal  . Hyperlipidemia Father   . Hypertension Father   . Stroke Father   . Cancer Paternal Aunt     breast  . Diabetes Brother   . Allergies Sister     Past medical history, surgical history, medications, allergies, family history and social history reviewed with patient today and changes made to appropriate areas of the chart.   Review of Systems  Constitutional: Negative.   HENT: Negative for congestion, ear discharge, ear pain, hearing loss, nosebleeds, sore throat and tinnitus.   Eyes: Negative.        Needs to have her eyes checked because her glasses broke   Respiratory: Negative.  Negative for stridor.   Cardiovascular: Negative.   Gastrointestinal: Positive for heartburn and abdominal pain. Negative for nausea, vomiting, diarrhea, constipation, blood in stool and melena.  Genitourinary: Negative.   Musculoskeletal: Negative.   Skin: Negative.   Neurological: Positive for dizziness and headaches. Negative for tingling, tremors, sensory change, speech change, focal weakness, seizures and loss of consciousness.  Endo/Heme/Allergies: Positive for environmental allergies. Negative for polydipsia. Does not bruise/bleed easily.  Psychiatric/Behavioral: Negative.     All other ROS negative except what is listed above and in the HPI.      Objective:    BP 109/75 mmHg  Pulse 73  Temp(Src) 98.2 F (36.8 C)  Ht 5' 2"  (1.575 m)  Wt 207 lb (93.895 kg)  BMI 37.85 kg/m2  SpO2 97%  LMP 08/04/2015  Wt Readings from Last 3 Encounters:  08/09/15 207 lb (93.895 kg)  06/26/15 207 lb (93.895 kg)  11/21/14 194 lb (87.998 kg)    Physical Exam  Constitutional: She is oriented to person, place, and time. She appears well-developed and well-nourished. No distress.  HENT:  Head: Normocephalic and atraumatic.  Right Ear: Hearing and external ear normal.  Left Ear: Hearing and external ear normal.  Nose: Nose normal.  Mouth/Throat: Oropharynx is clear  and moist. No oropharyngeal exudate.  Eyes: Conjunctivae, EOM and lids are normal. Pupils are equal, round, and reactive to light. Right eye exhibits no discharge. Left eye exhibits no discharge. No scleral icterus.  Neck: Neck supple. No JVD present. No tracheal deviation present. No thyromegaly present.  Cardiovascular: Normal rate, regular rhythm, normal heart sounds and intact distal pulses.  Exam reveals no gallop and no friction rub.   No murmur heard. Pulmonary/Chest: Effort normal and breath sounds normal. No stridor. No respiratory distress. She has no wheezes. She has no rales. She exhibits no tenderness.  Abdominal: Soft. Bowel sounds are normal. She exhibits no distension and no mass. There is no tenderness. There is no rebound and no  guarding.  Genitourinary:  Deferred with shared decision making  Musculoskeletal: Normal range of motion. She exhibits no edema or tenderness.  Lymphadenopathy:    She has no cervical adenopathy.  Neurological: She is alert and oriented to person, place, and time. She has normal reflexes. She displays normal reflexes. No cranial nerve deficit. She exhibits normal muscle tone. Coordination normal.  Skin: Skin is warm, dry and intact. No rash noted. She is not diaphoretic. No erythema. No pallor.  Psychiatric: She has a normal mood and affect. Her speech is normal and behavior is normal. Judgment and thought content normal. Cognition and memory are normal.    Results for orders placed or performed in visit on 06/26/15  Bayer DCA Hb A1c Waived  Result Value Ref Range   Bayer DCA Hb A1c Waived 5.6 <7.0 %  Comprehensive metabolic panel  Result Value Ref Range   Glucose 42 (L) 65 - 99 mg/dL   BUN 11 6 - 20 mg/dL   Creatinine, Ser 0.87 0.57 - 1.00 mg/dL   GFR calc non Af Amer 84 >59 mL/min/1.73   GFR calc Af Amer 97 >59 mL/min/1.73   BUN/Creatinine Ratio 13 8 - 20   Sodium 140 134 - 144 mmol/L   Potassium 4.4 3.5 - 5.2 mmol/L   Chloride 102 97 - 108  mmol/L   CO2 23 18 - 29 mmol/L   Calcium 9.6 8.7 - 10.2 mg/dL   Total Protein 6.5 6.0 - 8.5 g/dL   Albumin 4.3 3.5 - 5.5 g/dL   Globulin, Total 2.2 1.5 - 4.5 g/dL   Albumin/Globulin Ratio 2.0 1.1 - 2.5   Bilirubin Total 0.2 0.0 - 1.2 mg/dL   Alkaline Phosphatase 74 39 - 117 IU/L   AST 16 0 - 40 IU/L   ALT 15 0 - 32 IU/L  Microalbumin, Urine Waived  Result Value Ref Range   Microalb, Ur Waived 10 0 - 19 mg/L   Creatinine, Urine Waived 50 10 - 300 mg/dL   Microalb/Creat Ratio <30 <30 mg/g  CBC With Differential/Platelet  Result Value Ref Range   WBC 8.6 3.4 - 10.8 x10E3/uL   RBC 4.17 3.77 - 5.28 x10E6/uL   Hemoglobin 13.3 11.1 - 15.9 g/dL   Hematocrit 38.9 34.0 - 46.6 %   MCV 93 79 - 97 fL   MCH 31.9 26.6 - 33.0 pg   MCHC 34.2 31.5 - 35.7 g/dL   RDW 13.3 12.3 - 15.4 %   Platelets 258 150 - 379 x10E3/uL   Neutrophils 63 %   Lymphs 29 %   MID 9 %   Neutrophils Absolute 5.4 1.4 - 7.0 x10E3/uL   Lymphocytes Absolute 2.5 0.7 - 3.1 x10E3/uL   MID (Absolute) 0.7 0.1 - 1.6 X10E3/uL  Lipid Panel Piccolo, Waived  Result Value Ref Range   Cholesterol Piccolo, Waived 149 <200 mg/dL   HDL Chol Piccolo, Waived 71 >59 mg/dL   Triglycerides Piccolo,Waived 109 <150 mg/dL   Chol/HDL Ratio Piccolo,Waive 2.1 mg/dL   LDL Chol Calc Piccolo Waived 56 <100 mg/dL   VLDL Chol Calc Piccolo,Waive 22 <30 mg/dL  B12  Result Value Ref Range   Vitamin B-12 593 211 - 946 pg/mL  Folate  Result Value Ref Range   Folate 13.3 >3.0 ng/mL  Glucose Hemocue Waived  Result Value Ref Range   Glu Hemocue Waived 141 (H) 65 - 99 mg/dL      Assessment & Plan:   Problem List Items Addressed This Visit  None    Visit Diagnoses    Routine general medical examination at a health care facility    -  Primary    Up to date on pap. Screening labs except thyroid checked last visit, will check TSH and urine next visit. V    Immunization due        Tdap given today. Refused flu and pneumovax    Relevant Orders     Tdap vaccine greater than or equal to 7yo IM (Completed)    Screening for breast cancer        Mammogram ordered.     Relevant Orders    MM DIGITAL SCREENING BILATERAL    Encounter for initial prescription of other contraceptives        Would like to do nexplanon/implanon. Referral to GYN made today.    Relevant Orders    Ambulatory referral to Gynecology        Follow up plan: Return 4 months, for DM follow up.   LABORATORY TESTING:  - Pap smear: up to date  IMMUNIZATIONS:   - Tdap: Tetanus vaccination status reviewed: Td vaccination indicated and given today. - Influenza: Refused - Pneumovax: Refused  SCREENING: -Mammogram: Ordered today   PATIENT COUNSELING:   Advised to take 1 mg of folate supplement per day if capable of pregnancy.   Sexuality: Discussed sexually transmitted diseases, partner selection, use of condoms, avoidance of unintended pregnancy  and contraceptive alternatives.   Advised to avoid cigarette smoking.  I discussed with the patient that most people either abstain from alcohol or drink within safe limits (<=14/week and <=4 drinks/occasion for males, <=7/weeks and <= 3 drinks/occasion for females) and that the risk for alcohol disorders and other health effects rises proportionally with the number of drinks per week and how often a drinker exceeds daily limits.  Discussed cessation/primary prevention of drug use and availability of treatment for abuse.   Diet: Encouraged to adjust caloric intake to maintain  or achieve ideal body weight, to reduce intake of dietary saturated fat and total fat, to limit sodium intake by avoiding high sodium foods and not adding table salt, and to maintain adequate dietary potassium and calcium preferably from fresh fruits, vegetables, and low-fat dairy products.    stressed the importance of regular exercise  Injury prevention: Discussed safety belts, safety helmets, smoke detector, smoking near bedding or  upholstery.   Dental health: Discussed importance of regular tooth brushing, flossing, and dental visits.    NEXT PREVENTATIVE PHYSICAL DUE IN 1 YEAR. Return 4 months, for DM follow up.

## 2015-08-09 NOTE — Patient Instructions (Signed)
Health Maintenance, Female Adopting a healthy lifestyle and getting preventive care can go a long way to promote health and wellness. Talk with your health care provider about what schedule of regular examinations is right for you. This is a good chance for you to check in with your provider about disease prevention and staying healthy. In between checkups, there are plenty of things you can do on your own. Experts have done a lot of research about which lifestyle changes and preventive measures are most likely to keep you healthy. Ask your health care provider for more information. WEIGHT AND DIET  Eat a healthy diet  Be sure to include plenty of vegetables, fruits, low-fat dairy products, and lean protein.  Do not eat a lot of foods high in solid fats, added sugars, or salt.  Get regular exercise. This is one of the most important things you can do for your health.  Most adults should exercise for at least 150 minutes each week. The exercise should increase your heart rate and make you sweat (moderate-intensity exercise).  Most adults should also do strengthening exercises at least twice a week. This is in addition to the moderate-intensity exercise.  Maintain a healthy weight  Body mass index (BMI) is a measurement that can be used to identify possible weight problems. It estimates body fat based on height and weight. Your health care provider can help determine your BMI and help you achieve or maintain a healthy weight.  For females 20 years of age and older:   A BMI below 18.5 is considered underweight.  A BMI of 18.5 to 24.9 is normal.  A BMI of 25 to 29.9 is considered overweight.  A BMI of 30 and above is considered obese.  Watch levels of cholesterol and blood lipids  You should start having your blood tested for lipids and cholesterol at 39 years of age, then have this test every 5 years.  You may need to have your cholesterol levels checked more often if:  Your lipid  or cholesterol levels are high.  You are older than 39 years of age.  You are at high risk for heart disease.  CANCER SCREENING   Lung Cancer  Lung cancer screening is recommended for adults 55-80 years old who are at high risk for lung cancer because of a history of smoking.  A yearly low-dose CT scan of the lungs is recommended for people who:  Currently smoke.  Have quit within the past 15 years.  Have at least a 30-pack-year history of smoking. A pack year is smoking an average of one pack of cigarettes a day for 1 year.  Yearly screening should continue until it has been 15 years since you quit.  Yearly screening should stop if you develop a health problem that would prevent you from having lung cancer treatment.  Breast Cancer  Practice breast self-awareness. This means understanding how your breasts normally appear and feel.  It also means doing regular breast self-exams. Let your health care provider know about any changes, no matter how small.  If you are in your 20s or 30s, you should have a clinical breast exam (CBE) by a health care provider every 1-3 years as part of a regular health exam.  If you are 40 or older, have a CBE every year. Also consider having a breast X-ray (mammogram) every year.  If you have a family history of breast cancer, talk to your health care provider about genetic screening.  If you   are at high risk for breast cancer, talk to your health care provider about having an MRI and a mammogram every year.  Breast cancer gene (BRCA) assessment is recommended for women who have family members with BRCA-related cancers. BRCA-related cancers include:  Breast.  Ovarian.  Tubal.  Peritoneal cancers.  Results of the assessment will determine the need for genetic counseling and BRCA1 and BRCA2 testing. Cervical Cancer Your health care provider may recommend that you be screened regularly for cancer of the pelvic organs (ovaries, uterus, and  vagina). This screening involves a pelvic examination, including checking for microscopic changes to the surface of your cervix (Pap test). You may be encouraged to have this screening done every 3 years, beginning at age 21.  For women ages 30-65, health care providers may recommend pelvic exams and Pap testing every 3 years, or they may recommend the Pap and pelvic exam, combined with testing for human papilloma virus (HPV), every 5 years. Some types of HPV increase your risk of cervical cancer. Testing for HPV may also be done on women of any age with unclear Pap test results.  Other health care providers may not recommend any screening for nonpregnant women who are considered low risk for pelvic cancer and who do not have symptoms. Ask your health care provider if a screening pelvic exam is right for you.  If you have had past treatment for cervical cancer or a condition that could lead to cancer, you need Pap tests and screening for cancer for at least 20 years after your treatment. If Pap tests have been discontinued, your risk factors (such as having a new sexual partner) need to be reassessed to determine if screening should resume. Some women have medical problems that increase the chance of getting cervical cancer. In these cases, your health care provider may recommend more frequent screening and Pap tests. Colorectal Cancer  This type of cancer can be detected and often prevented.  Routine colorectal cancer screening usually begins at 39 years of age and continues through 39 years of age.  Your health care provider may recommend screening at an earlier age if you have risk factors for colon cancer.  Your health care provider may also recommend using home test kits to check for hidden blood in the stool.  A small camera at the end of a tube can be used to examine your colon directly (sigmoidoscopy or colonoscopy). This is done to check for the earliest forms of colorectal  cancer.  Routine screening usually begins at age 50.  Direct examination of the colon should be repeated every 5-10 years through 39 years of age. However, you may need to be screened more often if early forms of precancerous polyps or small growths are found. Skin Cancer  Check your skin from head to toe regularly.  Tell your health care provider about any new moles or changes in moles, especially if there is a change in a mole's shape or color.  Also tell your health care provider if you have a mole that is larger than the size of a pencil eraser.  Always use sunscreen. Apply sunscreen liberally and repeatedly throughout the day.  Protect yourself by wearing long sleeves, pants, a wide-brimmed hat, and sunglasses whenever you are outside. HEART DISEASE, DIABETES, AND HIGH BLOOD PRESSURE   High blood pressure causes heart disease and increases the risk of stroke. High blood pressure is more likely to develop in:  People who have blood pressure in the high end   of the normal range (130-139/85-89 mm Hg).  People who are overweight or obese.  People who are African American.  If you are 38-23 years of age, have your blood pressure checked every 3-5 years. If you are 61 years of age or older, have your blood pressure checked every year. You should have your blood pressure measured twice--once when you are at a hospital or clinic, and once when you are not at a hospital or clinic. Record the average of the two measurements. To check your blood pressure when you are not at a hospital or clinic, you can use:  An automated blood pressure machine at a pharmacy.  A home blood pressure monitor.  If you are between 45 years and 39 years old, ask your health care provider if you should take aspirin to prevent strokes.  Have regular diabetes screenings. This involves taking a blood sample to check your fasting blood sugar level.  If you are at a normal weight and have a low risk for diabetes,  have this test once every three years after 39 years of age.  If you are overweight and have a high risk for diabetes, consider being tested at a younger age or more often. PREVENTING INFECTION  Hepatitis B  If you have a higher risk for hepatitis B, you should be screened for this virus. You are considered at high risk for hepatitis B if:  You were born in a country where hepatitis B is common. Ask your health care provider which countries are considered high risk.  Your parents were born in a high-risk country, and you have not been immunized against hepatitis B (hepatitis B vaccine).  You have HIV or AIDS.  You use needles to inject street drugs.  You live with someone who has hepatitis B.  You have had sex with someone who has hepatitis B.  You get hemodialysis treatment.  You take certain medicines for conditions, including cancer, organ transplantation, and autoimmune conditions. Hepatitis C  Blood testing is recommended for:  Everyone born from [birth year] through 1965.  Anyone with known risk factors for hepatitis C. Sexually transmitted infections (STIs)  You should be screened for sexually transmitted infections (STIs) including gonorrhea and chlamydia if:  You are sexually active and are younger than 39 years of age.  You are older than 39 years of age and your health care provider tells you that you are at risk for this type of infection.  Your sexual activity has changed since you were last screened and you are at an increased risk for chlamydia or gonorrhea. Ask your health care provider if you are at risk.  If you do not have HIV, but are at risk, it may be recommended that you take a prescription medicine daily to prevent HIV infection. This is called pre-exposure prophylaxis (PrEP). You are considered at risk if:  You are sexually active and do not regularly use condoms or know the HIV status of your partner(s).  You take drugs by injection.  You are sexually  active with a partner who has HIV. Talk with your health care provider about whether you are at high risk of being infected with HIV. If you choose to begin PrEP, you should first be tested for HIV. You should then be tested every 3 months for as long as you are taking PrEP.  PREGNANCY   If you are premenopausal and you may become pregnant, ask your health care provider about preconception counseling.  If you may  become pregnant, take 400 to 800 micrograms (mcg) of folic acid every day.  If you want to prevent pregnancy, talk to your health care provider about birth control (contraception). OSTEOPOROSIS AND MENOPAUSE   Osteoporosis is a disease in which the bones lose minerals and strength with aging. This can result in serious bone fractures. Your risk for osteoporosis can be identified using a bone density scan.  If you are 61 years of age or older, or if you are at risk for osteoporosis and fractures, ask your health care provider if you should be screened.  Ask your health care provider whether you should take a calcium or vitamin D supplement to lower your risk for osteoporosis.  Menopause may have certain physical symptoms and risks.  Hormone replacement therapy may reduce some of these symptoms and risks. Talk to your health care provider about whether hormone replacement therapy is right for you.  HOME CARE INSTRUCTIONS   Schedule regular health, dental, and eye exams.  Stay current with your immunizations.   Do not use any tobacco products including cigarettes, chewing tobacco, or electronic cigarettes.  If you are pregnant, do not drink alcohol.  If you are breastfeeding, limit how much and how often you drink alcohol.  Limit alcohol intake to no more than 1 drink per day for nonpregnant women. One drink equals 12 ounces of beer, 5 ounces of wine, or 1 ounces of hard liquor.  Do not use street drugs.  Do not share needles.  Ask your health care provider for help if  you need support or information about quitting drugs.  Tell your health care provider if you often feel depressed.  Tell your health care provider if you have ever been abused or do not feel safe at home.   This information is not intended to replace advice given to you by your health care provider. Make sure you discuss any questions you have with your health care provider.   Document Released: 04/07/2011 Document Revised: 10/13/2014 Document Reviewed: 08/24/2013 Elsevier Interactive Patient Education [employer].

## 2015-08-21 ENCOUNTER — Other Ambulatory Visit: Payer: Self-pay | Admitting: Family Medicine

## 2015-08-21 LAB — HM DIABETES EYE EXAM

## 2015-09-12 ENCOUNTER — Encounter: Payer: Managed Care, Other (non HMO) | Admitting: Certified Nurse Midwife

## 2015-09-19 ENCOUNTER — Ambulatory Visit: Payer: Managed Care, Other (non HMO) | Admitting: Family Medicine

## 2015-10-22 ENCOUNTER — Ambulatory Visit (INDEPENDENT_AMBULATORY_CARE_PROVIDER_SITE_OTHER): Payer: Managed Care, Other (non HMO) | Admitting: Family Medicine

## 2015-10-22 ENCOUNTER — Encounter: Payer: Self-pay | Admitting: Family Medicine

## 2015-10-22 ENCOUNTER — Telehealth: Payer: Self-pay | Admitting: Family Medicine

## 2015-10-22 VITALS — BP 110/76 | HR 88 | Temp 98.5°F | Ht 62.3 in | Wt 208.0 lb

## 2015-10-22 DIAGNOSIS — M25551 Pain in right hip: Secondary | ICD-10-CM

## 2015-10-22 DIAGNOSIS — N760 Acute vaginitis: Secondary | ICD-10-CM | POA: Diagnosis not present

## 2015-10-22 DIAGNOSIS — A499 Bacterial infection, unspecified: Secondary | ICD-10-CM | POA: Diagnosis not present

## 2015-10-22 DIAGNOSIS — B9689 Other specified bacterial agents as the cause of diseases classified elsewhere: Secondary | ICD-10-CM

## 2015-10-22 DIAGNOSIS — N898 Other specified noninflammatory disorders of vagina: Secondary | ICD-10-CM

## 2015-10-22 LAB — WET PREP FOR TRICH, YEAST, CLUE
CLUE CELL EXAM: POSITIVE — AB
Trichomonas Exam: NEGATIVE
Yeast Exam: NEGATIVE

## 2015-10-22 MED ORDER — METRONIDAZOLE 500 MG PO TABS: 500.0000 mg | ORAL_TABLET | Freq: Three times a day (TID) | ORAL | Status: DC

## 2015-10-22 MED ORDER — CYCLOBENZAPRINE HCL 10 MG PO TABS: 10.0000 mg | ORAL_TABLET | Freq: Every day | ORAL | Status: DC

## 2015-10-22 NOTE — Telephone Encounter (Signed)
If she is not at work or driving, she can take a whole pill every 8 hours.

## 2015-10-22 NOTE — Progress Notes (Signed)
BP 110/76 mmHg  Pulse 88  Temp(Src) 98.5 F (36.9 C)  Ht 5' 2.3" (1.582 m)  Wt 208 lb (94.348 kg)  BMI 37.70 kg/m2  SpO2 97%  LMP 10/01/2015 (Approximate)   Subjective:    Patient ID: Belinda [name], female    DOB: [birth year], 40 y.o.   MRN: [id number]  HPI: Belinda [name] is a 40 y.o. female  Chief Complaint  Patient presents with  . Hip Pain    right  . Vaginitis    Patient was recently on an antibiotic   VAGINAL DISCHARGE Duration: 3 days Discharge description: cottage cheese  Pruritus: yes Dysuria: yes Malodorous: yes Urinary frequency: no Fevers: no Abdominal pain: no  Sexual activity: practicing safe sex History of sexually transmitted diseases: no Recent antibiotic use: yes Treatments attempted: antifungal  HIP PAIN- having sharp pain in her hip over her trochateric bursa Duration: 3-4 days Involved hip: right  Mechanism of injury: unknown Location: lateral Onset: sudden  Severity: 10/10  Quality: sharp, cramping and stabbing Frequency: intermittent Radiation: no Aggravating factors: nothing   Alleviating factors: nothing  Status: better Treatments attempted: none   Relief with NSAIDs?: No NSAIDs Taken Weakness with weight bearing: no Weakness with walking: no Paresthesias / decreased sensation: no Swelling: no Redness:no Fevers: no  Relevant past medical, surgical, family and social history reviewed and updated as indicated. Interim medical history since our last visit reviewed. Allergies and medications reviewed and updated.  Review of Systems  Constitutional: Negative.   Respiratory: Negative.   Cardiovascular: Negative.   Psychiatric/Behavioral: Negative.     Per HPI unless specifically indicated above     Objective:    BP 110/76 mmHg  Pulse 88  Temp(Src) 98.5 F (36.9 C)  Ht 5' 2.3" (1.582 m)  Wt 208 lb (94.348 kg)  BMI 37.70 kg/m2  SpO2 97%  LMP 10/01/2015 (Approximate)  Wt Readings from Last 3 Encounters:  10/22/15  208 lb (94.348 kg)  08/09/15 207 lb (93.895 kg)  06/26/15 207 lb (93.895 kg)    Physical Exam  Constitutional: She is oriented to person, place, and time. She appears well-developed and well-nourished. No distress.  HENT:  Head: Normocephalic and atraumatic.  Right Ear: Hearing normal.  Left Ear: Hearing normal.  Nose: Nose normal.  Eyes: Conjunctivae and lids are normal. Right eye exhibits no discharge. Left eye exhibits no discharge. No scleral icterus.  Cardiovascular: Normal rate, regular rhythm, normal heart sounds and intact distal pulses.  Exam reveals no gallop and no friction rub.   No murmur heard. Pulmonary/Chest: Effort normal and breath sounds normal. No respiratory distress. She has no wheezes. She has no rales. She exhibits no tenderness.  Abdominal: Hernia confirmed negative in the right inguinal area and confirmed negative in the left inguinal area.  Genitourinary: There is erythema and tenderness in the vagina. No bleeding in the vagina. No foreign body around the vagina. No signs of injury around the vagina. Vaginal discharge found.  Musculoskeletal: Normal range of motion.  Lymphadenopathy:       Right: No inguinal adenopathy present.       Left: No inguinal adenopathy present.  Neurological: She is alert and oriented to person, place, and time.  Skin: Skin is warm, dry and intact. No rash noted. No erythema. No pallor.  Psychiatric: She has a normal mood and affect. Her speech is normal and behavior is normal. Judgment and thought content normal. Cognition and memory are normal.  Nursing note and vitals reviewed. Hip  Exam: Right    Tenderness to palpation:      Greater trochanter: no      Anterior superior iliac spine: yes     Anterior hip: yes     Iliac crest: no     Iliac tubercle: no     Pubic tubercle: no     SI joint: no     Range of Motion: Full ROM    Muscle Strength:  5/5 bilaterally    Special Tests:    Ober test: negative    FABER  test:negative    Results for orders placed or performed in visit on 06/26/15  Bayer DCA Hb A1c Waived  Result Value Ref Range   Bayer DCA Hb A1c Waived 5.6 <7.0 %  Comprehensive metabolic panel  Result Value Ref Range   Glucose 42 (L) 65 - 99 mg/dL   BUN 11 6 - 20 mg/dL   Creatinine, Ser 1.61 0.57 - 1.00 mg/dL   GFR calc non Af Amer 84 >59 mL/min/1.73   GFR calc Af Amer 97 >59 mL/min/1.73   BUN/Creatinine Ratio 13 8 - 20   Sodium 140 134 - 144 mmol/L   Potassium 4.4 3.5 - 5.2 mmol/L   Chloride 102 97 - 108 mmol/L   CO2 23 18 - 29 mmol/L   Calcium 9.6 8.7 - 10.2 mg/dL   Total Protein 6.5 6.0 - 8.5 g/dL   Albumin 4.3 3.5 - 5.5 g/dL   Globulin, Total 2.2 1.5 - 4.5 g/dL   Albumin/Globulin Ratio 2.0 1.1 - 2.5   Bilirubin Total 0.2 0.0 - 1.2 mg/dL   Alkaline Phosphatase 74 39 - 117 IU/L   AST 16 0 - 40 IU/L   ALT 15 0 - 32 IU/L  Microalbumin, Urine Waived  Result Value Ref Range   Microalb, Ur Waived 10 0 - 19 mg/L   Creatinine, Urine Waived 50 10 - 300 mg/dL   Microalb/Creat Ratio <30 <30 mg/g  CBC With Differential/Platelet  Result Value Ref Range   WBC 8.6 3.4 - 10.8 x10E3/uL   RBC 4.17 3.77 - 5.28 x10E6/uL   Hemoglobin 13.3 11.1 - 15.9 g/dL   Hematocrit 09.6 04.5 - 46.6 %   MCV 93 79 - 97 fL   MCH 31.9 26.6 - 33.0 pg   MCHC 34.2 31.5 - 35.7 g/dL   RDW 40.9 81.1 - 91.4 %   Platelets 258 150 - 379 x10E3/uL   Neutrophils 63 %   Lymphs 29 %   MID 9 %   Neutrophils Absolute 5.4 1.4 - 7.0 x10E3/uL   Lymphocytes Absolute 2.5 0.7 - 3.1 x10E3/uL   MID (Absolute) 0.7 0.1 - 1.6 X10E3/uL  Lipid Panel Piccolo, Waived  Result Value Ref Range   Cholesterol Piccolo, Waived 149 <200 mg/dL   HDL Chol Piccolo, Waived 71 >59 mg/dL   Triglycerides Piccolo,Waived 109 <150 mg/dL   Chol/HDL Ratio Piccolo,Waive 2.1 mg/dL   LDL Chol Calc Piccolo Waived 56 <100 mg/dL   VLDL Chol Calc Piccolo,Waive 22 <30 mg/dL  N82  Result Value Ref Range   Vitamin B-12 593 211 - 946 pg/mL  Folate   Result Value Ref Range   Folate 13.3 >3.0 ng/mL  Glucose Hemocue Waived  Result Value Ref Range   Glu Hemocue Waived 141 (H) 65 - 99 mg/dL      Assessment & Plan:   Problem List Items Addressed This Visit    None    Visit Diagnoses    BV (bacterial vaginosis)    -  Primary    Will treat with metronidazole. Call if not getting better or getting worse.     Relevant Medications    metroNIDAZOLE (FLAGYL) 500 MG tablet    Vaginal discharge        + Clue cells, will treat BV    Relevant Orders    WET PREP FOR TRICH, YEAST, CLUE    Hip pain, right        Likely due to muscle spasms. Rx for flexeril given and hip flexor stretches. Continue to monitor and call if not getting better or getting worse.         Follow up plan: Return if symptoms worsen or fail to improve.

## 2015-10-22 NOTE — Telephone Encounter (Signed)
pt called says she was given flexeril this morning at 8:45 but it's not working. says she was told to only take a half pill during the day, can she take more?

## 2015-10-22 NOTE — Telephone Encounter (Signed)
Forward to provider

## 2015-10-22 NOTE — Telephone Encounter (Signed)
Patient notified

## 2015-10-25 ENCOUNTER — Encounter: Payer: Self-pay | Admitting: Family Medicine

## 2015-10-25 ENCOUNTER — Telehealth: Payer: Self-pay | Admitting: Family Medicine

## 2015-10-25 NOTE — Telephone Encounter (Signed)
Pt requested a note for school 1/16 and 1/17. Appt was 1/16

## 2015-10-29 ENCOUNTER — Telehealth: Payer: Self-pay | Admitting: Family Medicine

## 2015-10-29 NOTE — Telephone Encounter (Signed)
Pt called stating as soon as she stopped taking the muscle relaxer the leg muscle spasms started again.  She is not sure if she should continue taking them or if there is something else she can do?  Please call and advise patient.

## 2015-10-30 MED ORDER — TIZANIDINE HCL 4 MG PO CAPS: 4.0000 mg | ORAL_CAPSULE | Freq: Three times a day (TID) | ORAL | Status: DC | PRN

## 2015-10-30 NOTE — Telephone Encounter (Signed)
Will change her cyclobenzaprine to tizanidine. Will call for appointment next week.

## 2015-11-06 ENCOUNTER — Encounter: Payer: Self-pay | Admitting: Family Medicine

## 2015-11-06 ENCOUNTER — Ambulatory Visit
Admission: RE | Admit: 2015-11-06 | Discharge: 2015-11-06 | Disposition: A | Discharge status: Home / Self Care | Payer: Managed Care, Other (non HMO) | Source: Ambulatory Visit | Attending: Family Medicine | Admitting: Family Medicine

## 2015-11-06 ENCOUNTER — Telehealth: Payer: Self-pay | Admitting: Family Medicine

## 2015-11-06 ENCOUNTER — Ambulatory Visit (INDEPENDENT_AMBULATORY_CARE_PROVIDER_SITE_OTHER): Payer: Managed Care, Other (non HMO) | Admitting: Family Medicine

## 2015-11-06 VITALS — BP 113/76 | HR 91 | Temp 98.9°F | Ht 62.2 in | Wt 211.0 lb

## 2015-11-06 DIAGNOSIS — M25551 Pain in right hip: Secondary | ICD-10-CM

## 2015-11-06 DIAGNOSIS — R110 Nausea: Secondary | ICD-10-CM | POA: Diagnosis not present

## 2015-11-06 LAB — PREGNANCY, URINE: PREG TEST UR: NEGATIVE

## 2015-11-06 IMAGING — CR DG HIP (WITH OR WITHOUT PELVIS) 2-3V*R*
1 series · 3 of 3 positions shown · non-contrast
Comparison: None.

CLINICAL DATA: Right anterior hip pain for 3 weeks

EXAM:
DG HIP (WITH OR WITHOUT PELVIS) 2-3V RIGHT

[Series 1: ap · 0.17mm/px · 3 of 3 slices shown]
[im 1/3]
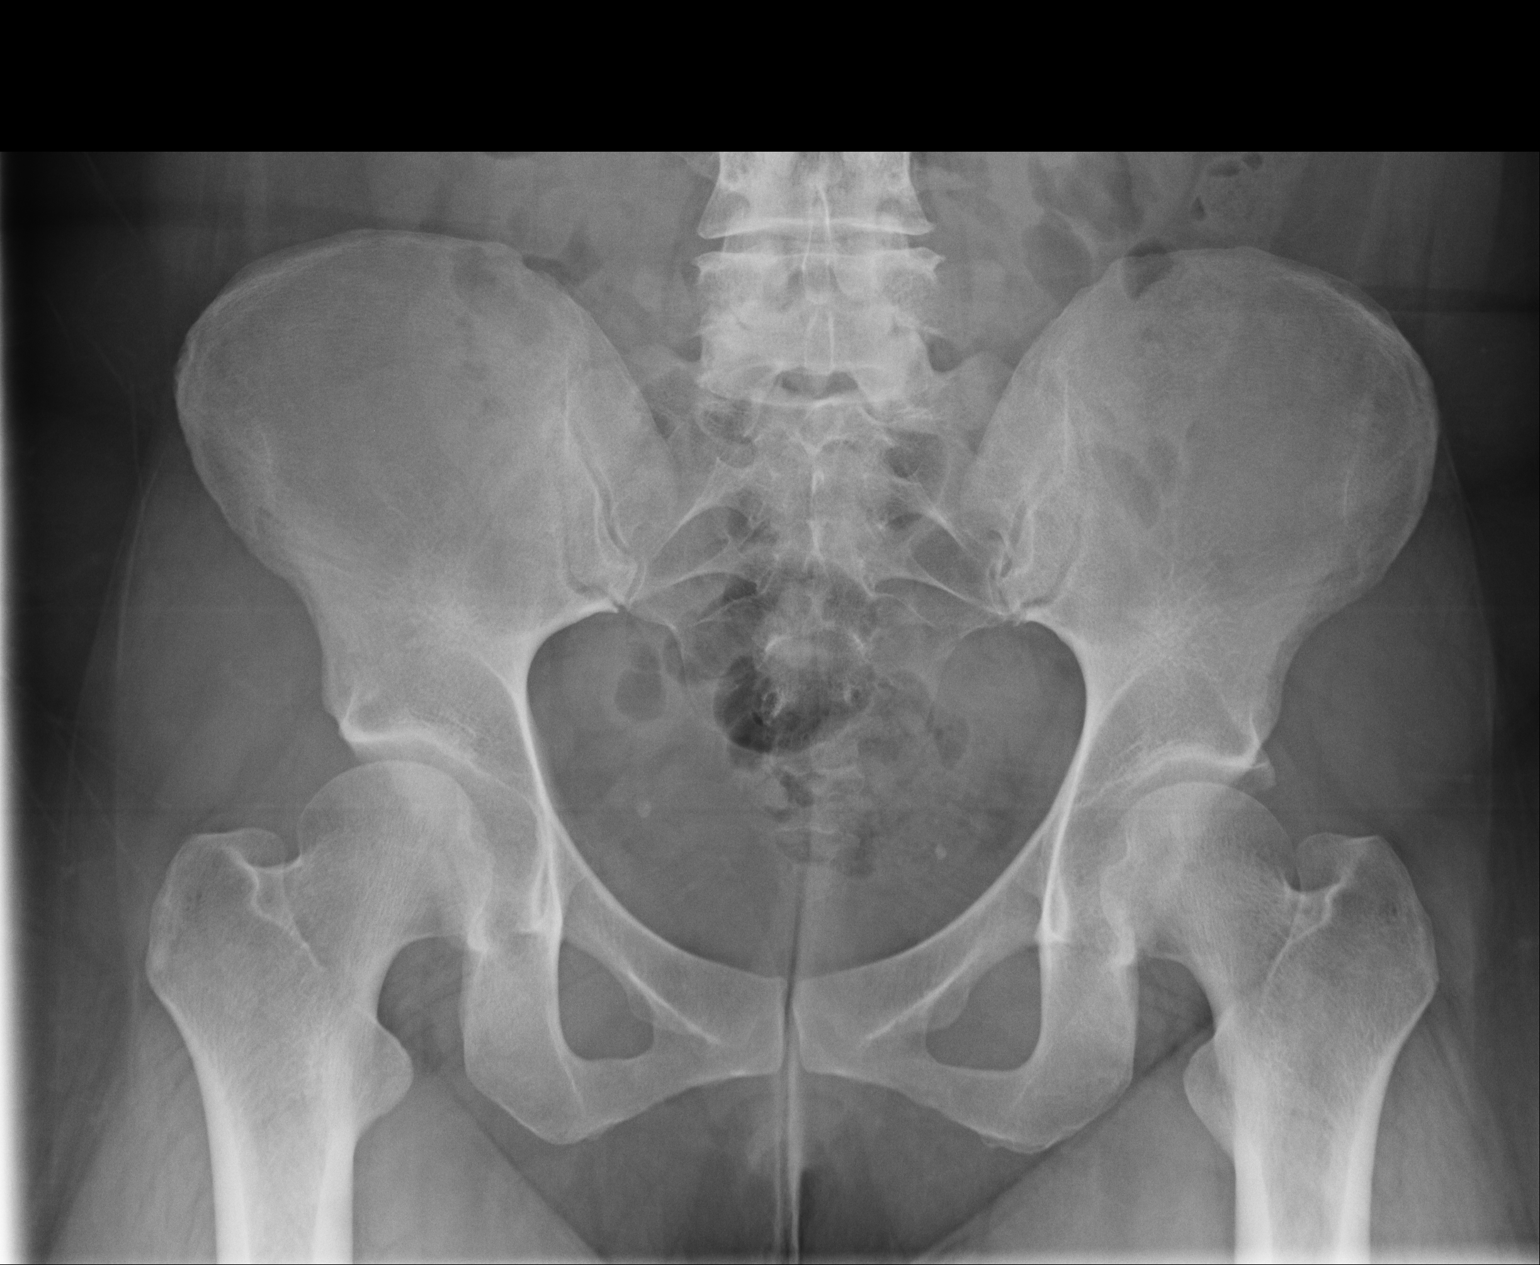
[im 2/3]
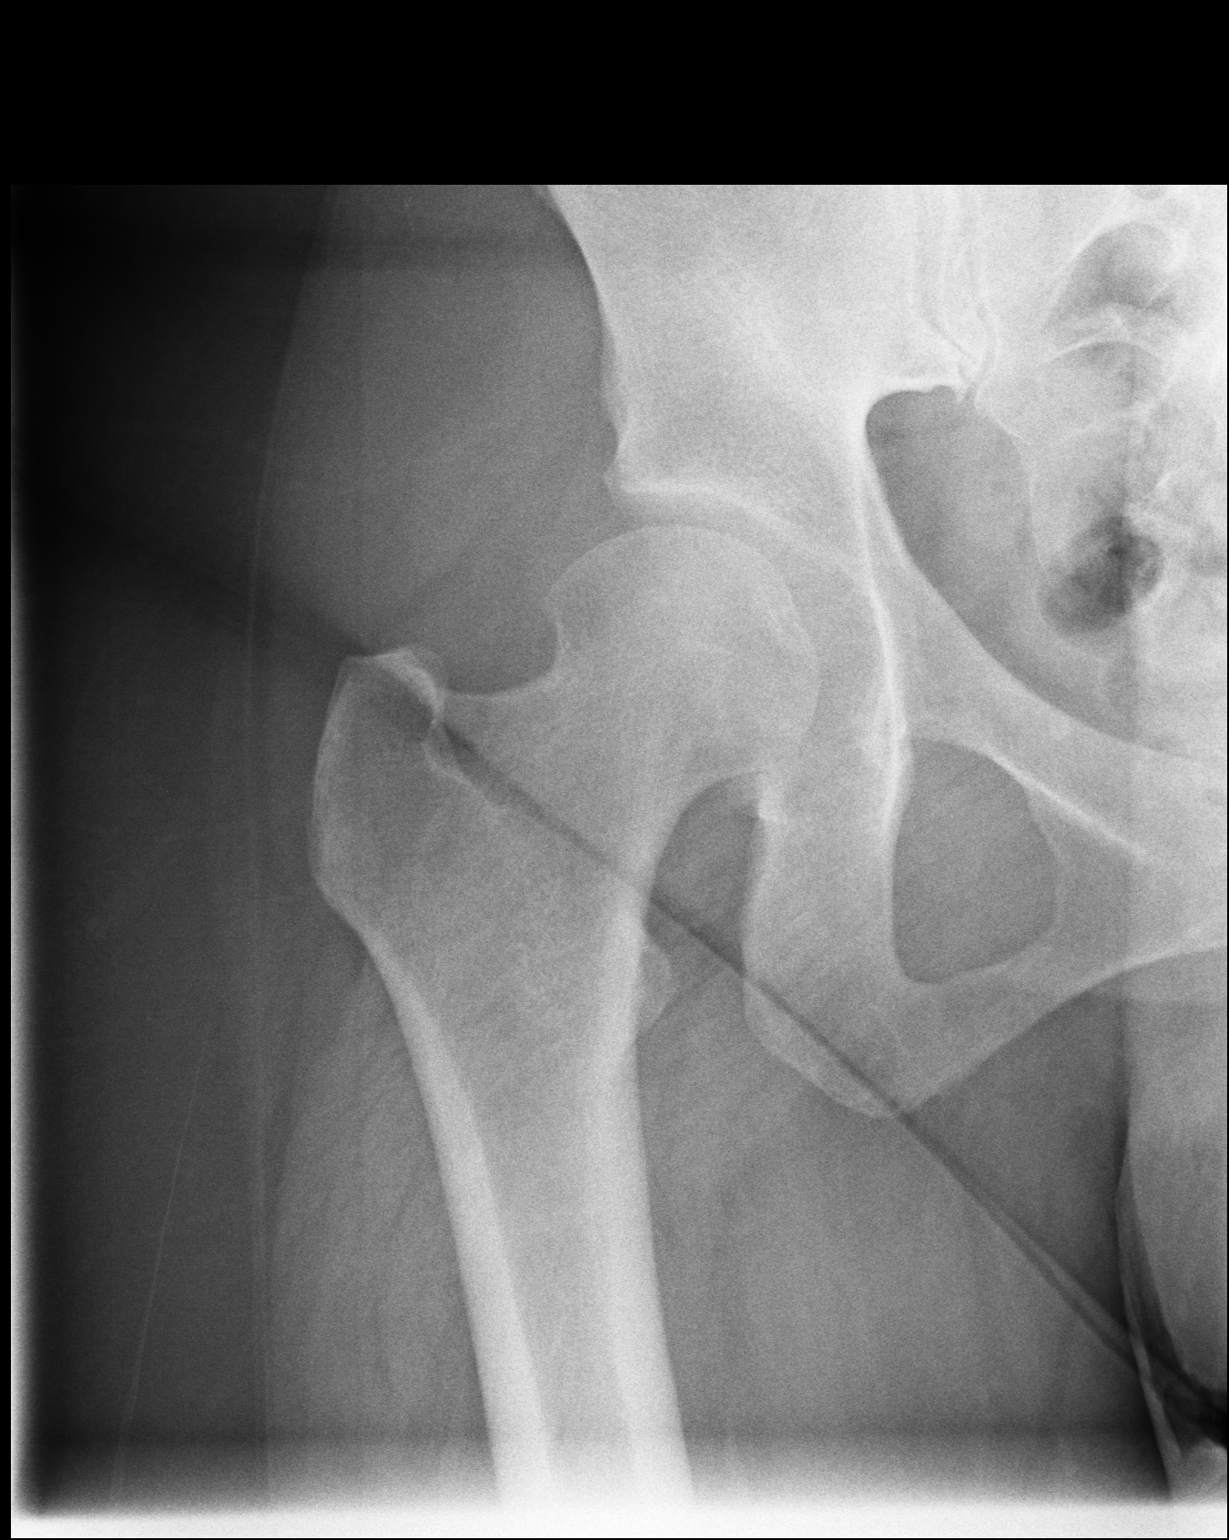
[im 3/3]
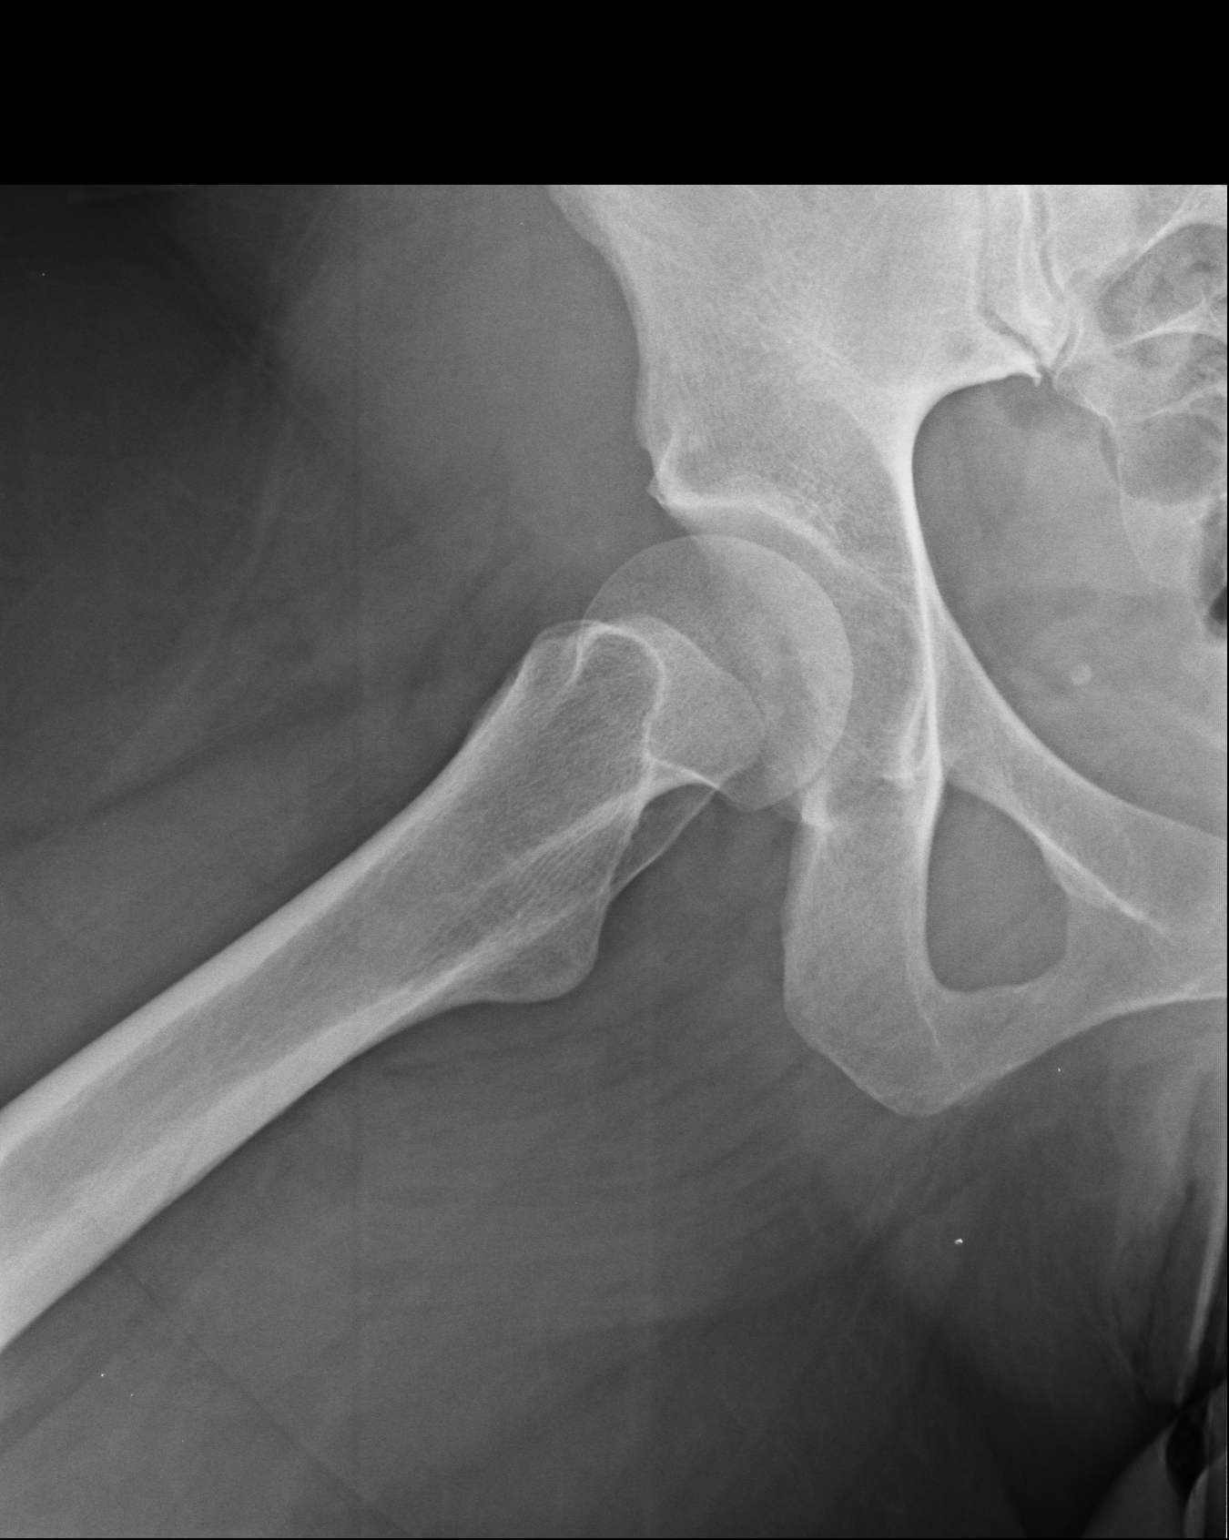

[3 of 3 positions shown; findings below may reference images not displayed]

FINDINGS: Hip joints and SI joints are symmetric and unremarkable. No acute
bony abnormality. Specifically, no fracture, subluxation, or
dislocation. Soft tissues are intact.
IMPRESSION: Negative.

## 2015-11-06 MED ORDER — ONDANSETRON HCL 4 MG PO TABS: 4.0000 mg | ORAL_TABLET | Freq: Three times a day (TID) | ORAL | Status: DC | PRN

## 2015-11-06 MED ORDER — CYCLOBENZAPRINE HCL 10 MG PO TABS: 10.0000 mg | ORAL_TABLET | Freq: Every day | ORAL | Status: DC

## 2015-11-06 NOTE — Progress Notes (Signed)
BP 113/76 mmHg  Pulse 91  Temp(Src) 98.9 F (37.2 C)  Ht 5' 2.2" (1.58 m)  Wt 211 lb (95.709 kg)  BMI 38.34 kg/m2  SpO2 97%  LMP 10/30/2015 (Approximate)   Subjective:    Patient ID: Belinda [name], female    DOB: [birth year], 40 y.o.   MRN: [id number]  HPI: Belinda [name] is a 40 y.o. female  Chief Complaint  Patient presents with  . Hip Pain  . Nausea    Patient would like a refill on Zofran   HIP PAIN- doing better. Not lasting as long. Not lasting 2-3 minutes, now only lasting less than a minute, happening 4-5 times a day, just got back to driving Duration: 3-4 weeks Involved hip: right  Mechanism of injury: unknown Location: in side the joint Onset: sudden  Severity: severe  Quality: sharp, cramping and sore Frequency: a few times a day Radiation: no Aggravating factors: nothing   Alleviating factors: muscle relaxer and heat   Status: better Treatments attempted:    Relief with NSAIDs?: No NSAIDs Taken Weakness with weight bearing: no Weakness with walking: no Paresthesias / decreased sensation: no Swelling: no Redness:no Fevers: no  Relevant past medical, surgical, family and social history reviewed and updated as indicated. Interim medical history since our last visit reviewed. Allergies and medications reviewed and updated.  Review of Systems  Constitutional: Negative.   Respiratory: Negative.   Cardiovascular: Negative.   Musculoskeletal: Positive for myalgias and arthralgias. Negative for back pain, joint swelling, gait problem, neck pain and neck stiffness.  Skin: Negative.   Psychiatric/Behavioral: Negative.     Per HPI unless specifically indicated above     Objective:    BP 113/76 mmHg  Pulse 91  Temp(Src) 98.9 F (37.2 C)  Ht 5' 2.2" (1.58 m)  Wt 211 lb (95.709 kg)  BMI 38.34 kg/m2  SpO2 97%  LMP 10/30/2015 (Approximate)  Wt Readings from Last 3 Encounters:  11/06/15 211 lb (95.709 kg)  10/22/15 208 lb (94.348 kg)  08/09/15 207  lb (93.895 kg)    Physical Exam  Constitutional: She is oriented to person, place, and time. She appears well-developed and well-nourished. No distress.  HENT:  Head: Normocephalic and atraumatic.  Right Ear: Hearing normal.  Left Ear: Hearing normal.  Nose: Nose normal.  Eyes: Conjunctivae and lids are normal. Right eye exhibits no discharge. Left eye exhibits no discharge. No scleral icterus.  Pulmonary/Chest: Effort normal. No respiratory distress.  Neurological: She is alert and oriented to person, place, and time.  Skin: Skin is warm, dry and intact. No rash noted. No erythema. No pallor.  Psychiatric: She has a normal mood and affect. Her speech is normal and behavior is normal. Judgment and thought content normal. Cognition and memory are normal.  Nursing note and vitals reviewed. Hip Exam: Right     Tenderness to palpation:      Greater trochanter: yes      Anterior superior iliac spine: yes     Anterior hip: yes     Iliac crest: no     Iliac tubercle: no     Pubic tubercle: no     SI joint: no      Range of Motion: Full ROM     Muscle Strength:  5/5 bilaterally     Special Tests:    Ober test: negative    FABER test:negative    Results for orders placed or performed in visit on 10/22/15  WET PREP FOR TRICH,  YEAST, CLUE  Result Value Ref Range   Trichomonas Exam Negative Negative   Yeast Exam Negative Negative   Clue Cell Exam Positive (A) Negative      Assessment & Plan:   Problem List Items Addressed This Visit    None    Visit Diagnoses    Right hip pain    -  Primary    With some of the spasm gone, is having some pain in the joint- will check xray. Continue flexeril. Continue to monitor. Consider PT if not getting better.     Relevant Orders    DG HIP UNILAT W OR W/O PELVIS 2-3 VIEWS RIGHT (Completed)    Nausea        Refill of zofran given today.    Relevant Orders    Pregnancy, urine        Follow up plan: No Follow-up on file.

## 2015-11-06 NOTE — Telephone Encounter (Signed)
Please let her know that her x-ray was negative. Keep taking the flexeril. If not better next week let me know

## 2015-11-16 ENCOUNTER — Telehealth: Payer: Self-pay | Admitting: Family Medicine

## 2015-11-16 NOTE — Telephone Encounter (Signed)
Out of school the 6th and 7th of February

## 2015-11-16 NOTE — Telephone Encounter (Signed)
Letter written and on your desk for [place] to fax/her pick up

## 2015-11-16 NOTE — Telephone Encounter (Signed)
Patient called stating that she is a Consulting civil engineer at [employer] in [place] and she has missed school due to pain on her right leg. She wants to know if Dr. Laural Benes can give her an excuse note for the days she has missed because of this. Please call patient [phone], thanks.

## 2015-11-16 NOTE — Telephone Encounter (Signed)
If we can find out what days that will be fine

## 2015-11-16 NOTE — Telephone Encounter (Signed)
Forward to provider

## 2015-11-24 ENCOUNTER — Other Ambulatory Visit: Payer: Self-pay | Admitting: Family Medicine

## 2015-11-26 ENCOUNTER — Ambulatory Visit
Admission: RE | Admit: 2015-11-26 | Discharge: 2015-11-26 | Disposition: A | Discharge status: Home / Self Care | Payer: Managed Care, Other (non HMO) | Source: Ambulatory Visit | Attending: Family Medicine | Admitting: Family Medicine

## 2015-11-26 ENCOUNTER — Ambulatory Visit (INDEPENDENT_AMBULATORY_CARE_PROVIDER_SITE_OTHER): Payer: Managed Care, Other (non HMO) | Admitting: Family Medicine

## 2015-11-26 ENCOUNTER — Encounter: Payer: Self-pay | Admitting: Family Medicine

## 2015-11-26 VITALS — BP 116/79 | HR 94 | Temp 99.0°F | Ht 62.2 in | Wt 212.0 lb

## 2015-11-26 DIAGNOSIS — M25551 Pain in right hip: Secondary | ICD-10-CM

## 2015-11-26 DIAGNOSIS — Z01818 Encounter for other preprocedural examination: Secondary | ICD-10-CM | POA: Diagnosis not present

## 2015-11-26 LAB — PREGNANCY, URINE: Preg Test, Ur: NEGATIVE

## 2015-11-26 IMAGING — CR DG LUMBAR SPINE COMPLETE 4+V
1 series · 5 of 5 positions shown · non-contrast
Comparison: 10/21/2011 and previous

CLINICAL DATA: Anterior Rt hip pain x 1 month, did have a normal
hip xray a few weeks ago, no old or new injuries or previous
surgeries, pain is localized to hip only, she states the doctor
wanted to make sure it wasn't coming from her back.

EXAM:
LUMBAR SPINE - COMPLETE 4+ VIEW

[Series 1: ap · 0.17mm/px · 5 of 5 slices shown]
[im 1/5]
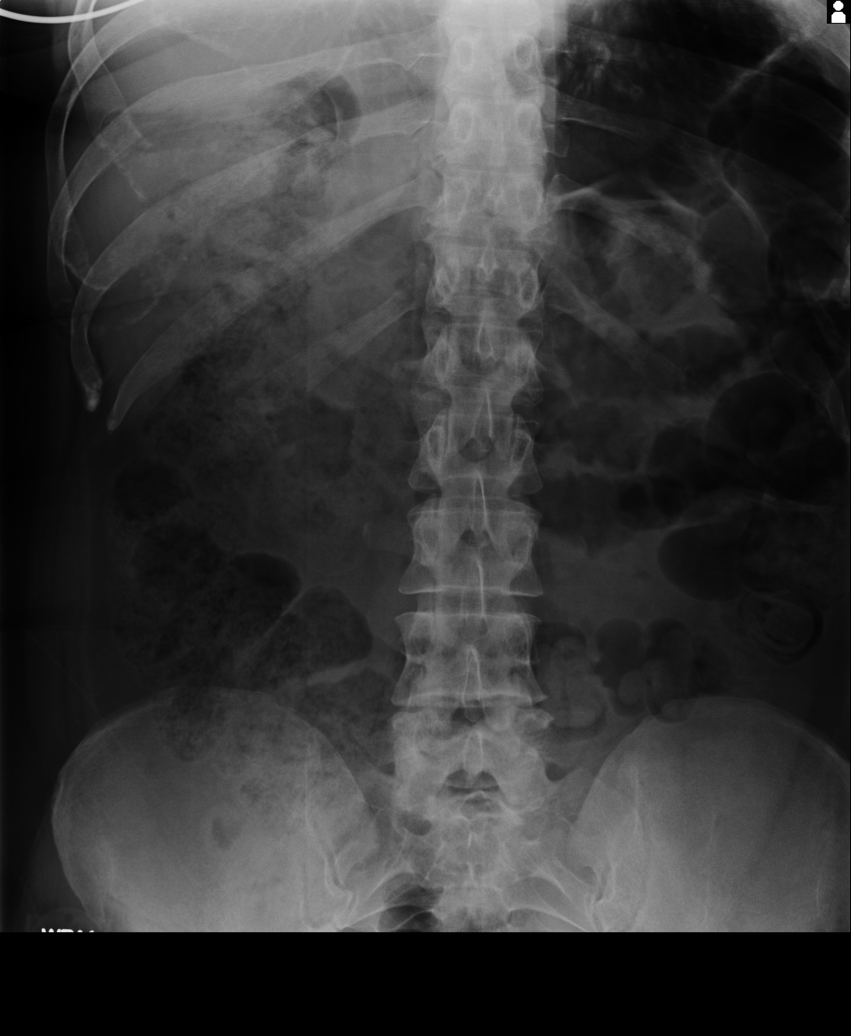
[im 2/5]
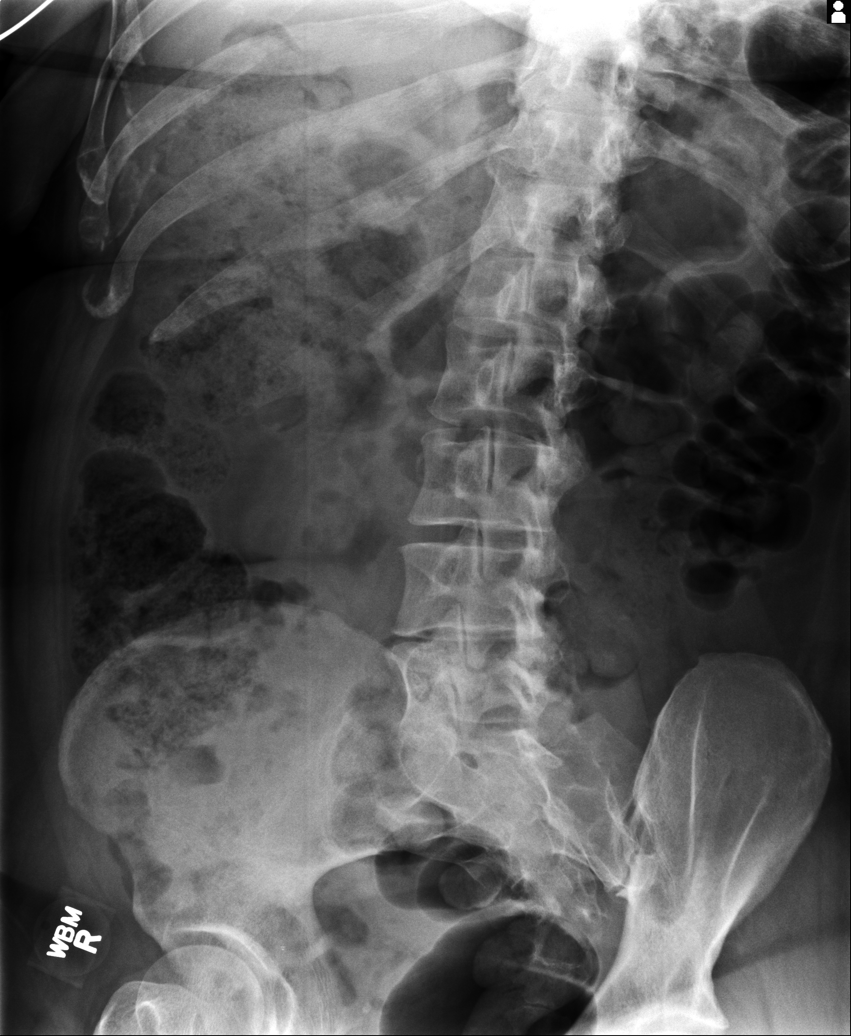
[im 3/5]
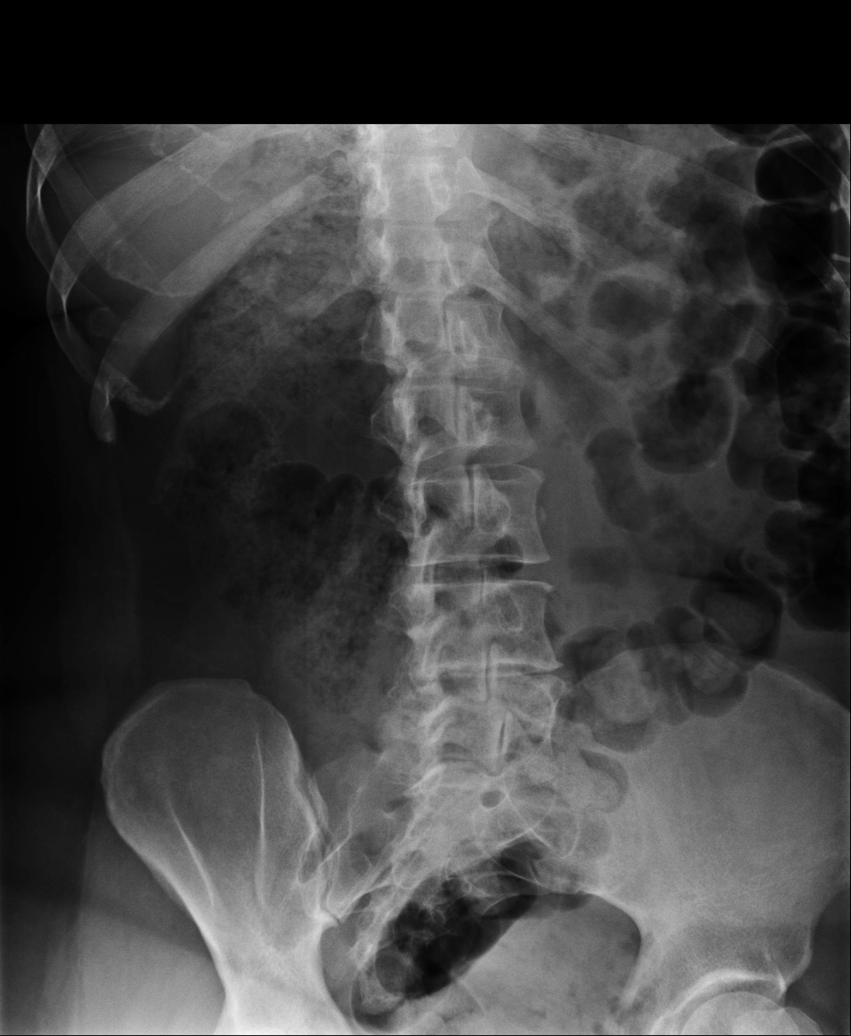
[im 4/5]
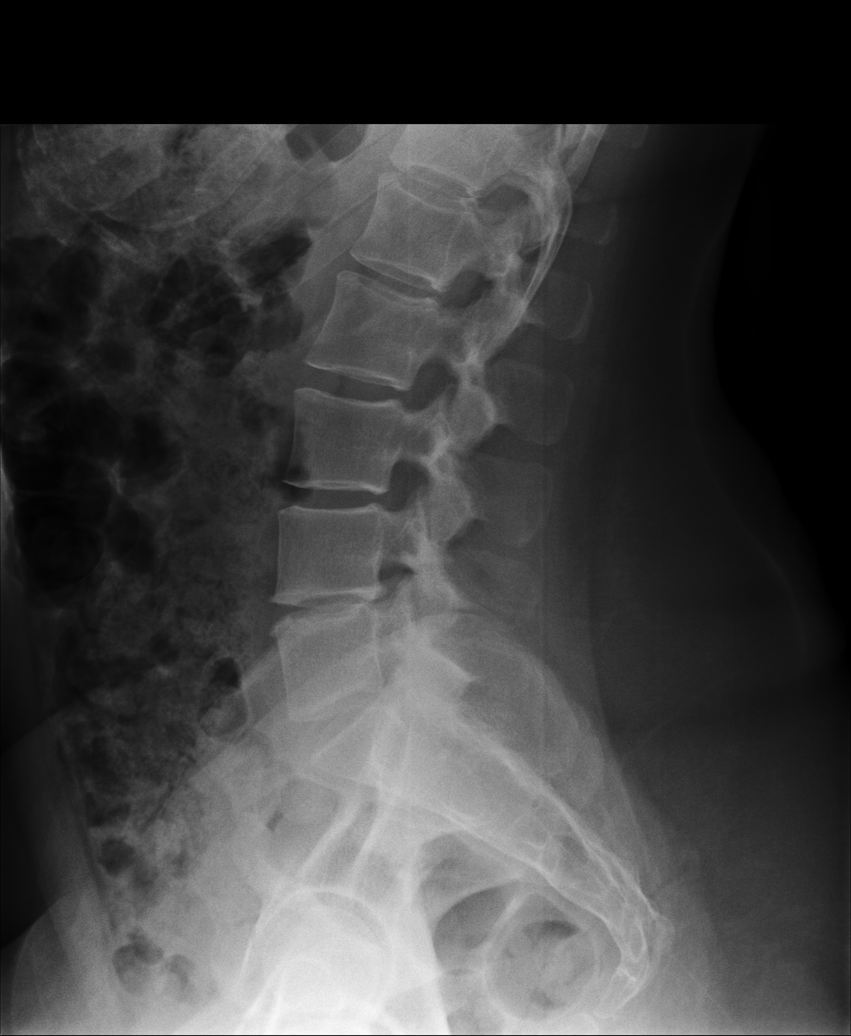
[im 5/5]
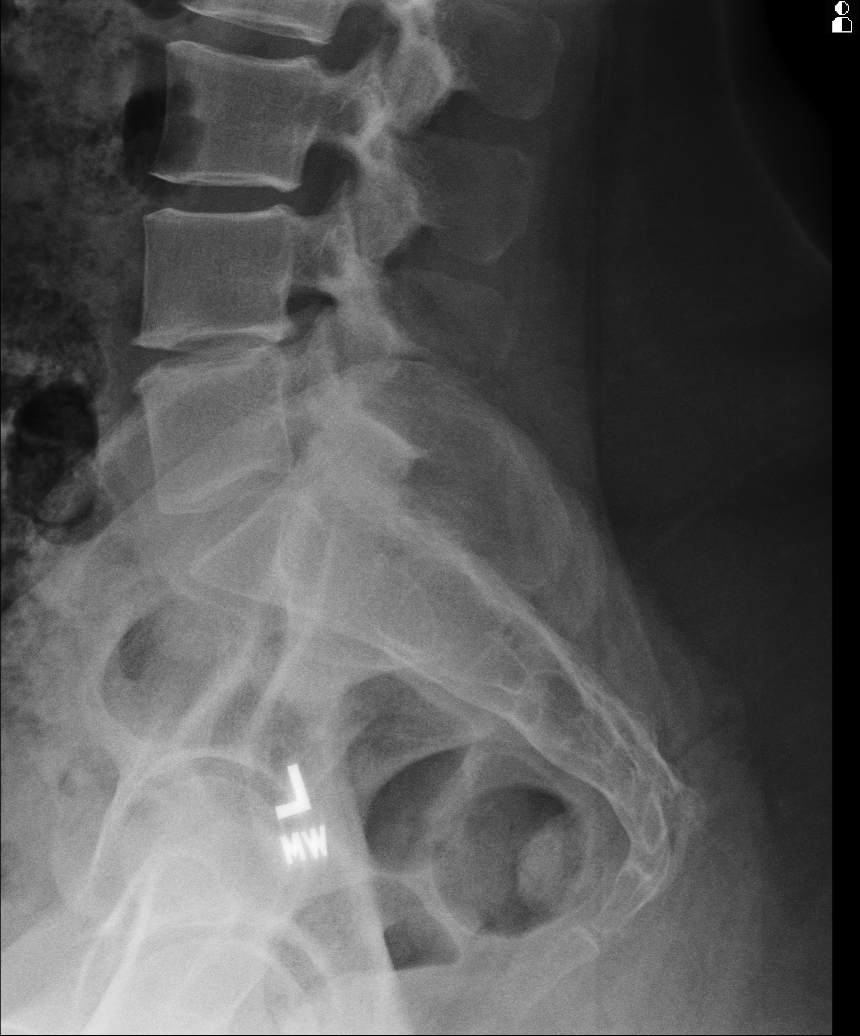

[5 of 5 positions shown; findings below may reference images not displayed]

FINDINGS: Mild narrowing of the L4-5 interspace with early endplate spurring.
Normal alignment. Negative for fracture. Normal mineralization. No
other significant osseous degenerative change.
IMPRESSION: 1. Early degenerative disc disease L4-5.

## 2015-11-26 NOTE — Progress Notes (Signed)
BP 116/79 mmHg  Pulse 94  Temp(Src) 99 F (37.2 C)  Ht 5' 2.2" (1.58 m)  Wt 212 lb (96.163 kg)  BMI 38.52 kg/m2  SpO2 99%  LMP 10/30/2015 (Approximate)   Subjective:    Patient ID: Belinda [name], female    DOB: [birth year], 40 y.o.   MRN: [id number]  HPI: Belinda [name] is a 40 y.o. female  Chief Complaint  Patient presents with  . Leg Pain    Right leg pain, patient states that it has been pretty bad lately, she has had to miss school due to the pain and cramping.   Has a couple of days that are good, but then a lot of days that are bad. Medicine is not really helping. Has been only masking the pain. She notes that it is sore. Worse when when she drives and feels anxious about that, so has been missing classes.  LEG CRAMPS Duration: 2 months Pain: yes Severity: severe  Quality:  sharp, cramping and shooting Location: R thigh Bilateral:  no Onset: sudden Frequency: 4-5x a day Time of  day:   at random Sudden unintentional leg jerking:   no Paresthesias:   no Decreased sensation:  no Weakness:   no Fatigue:   yes Status: stable Treatments attempted: muscle relaxer   Relevant past medical, surgical, family and social history reviewed and updated as indicated. Interim medical history since our last visit reviewed. Allergies and medications reviewed and updated.  Review of Systems  Constitutional: Negative.   Respiratory: Negative.   Cardiovascular: Negative.   Musculoskeletal: Positive for myalgias, back pain and gait problem. Negative for joint swelling, arthralgias, neck pain and neck stiffness.  Neurological: Negative.   Psychiatric/Behavioral: Negative.     Per HPI unless specifically indicated above     Objective:    BP 116/79 mmHg  Pulse 94  Temp(Src) 99 F (37.2 C)  Ht 5' 2.2" (1.58 m)  Wt 212 lb (96.163 kg)  BMI 38.52 kg/m2  SpO2 99%  LMP 10/30/2015 (Approximate)  Wt Readings from Last 3 Encounters:  11/26/15 212 lb (96.163 kg)  11/06/15  211 lb (95.709 kg)  10/22/15 208 lb (94.348 kg)    Physical Exam  Constitutional: She is oriented to person, place, and time. She appears well-developed and well-nourished. No distress.  HENT:  Head: Normocephalic and atraumatic.  Right Ear: Hearing normal.  Left Ear: Hearing normal.  Nose: Nose normal.  Eyes: Conjunctivae and lids are normal. Right eye exhibits no discharge. Left eye exhibits no discharge. No scleral icterus.  Cardiovascular: Normal rate, regular rhythm, normal heart sounds and intact distal pulses.  Exam reveals no gallop and no friction rub.   No murmur heard. Pulmonary/Chest: Effort normal and breath sounds normal. No respiratory distress. She has no wheezes. She has no rales. She exhibits no tenderness.  Musculoskeletal: Normal range of motion. She exhibits tenderness. She exhibits no edema.  Tender 1 inch medial to R ASIS and radiating around leg  Neurological: She is alert and oriented to person, place, and time.  Skin: Skin is warm, dry and intact. No rash noted. No erythema. No pallor.  Psychiatric: She has a normal mood and affect. Her speech is normal and behavior is normal. Judgment and thought content normal. Cognition and memory are normal.  Nursing note and vitals reviewed. Back Exam:    Inspection:  Normal spinal curvature.  No deformity, ecchymosis, erythema, or lesions     Palpation:     Midline spinal tenderness:  no      Paralumbar tenderness: yes Right     Parathoracic tenderness: no      Buttocks tenderness: Yes Right     Range of Motion:      Flexion: Normal     Extension:Decreased     Lateral bending:Decreased    Rotation:Decreased    Neuro Exam:Lower extremity DTRs normal & symmetric.  Strength and sensation intact.    Special Tests:      Straight leg raise:negative  Results for orders placed or performed in visit on 11/13/15  HM DIABETES EYE EXAM  Result Value Ref Range   HM Diabetic Eye Exam No Retinopathy No Retinopathy       Assessment & Plan:   Problem List Items Addressed This Visit    None    Visit Diagnoses    Right hip pain    -  Primary    Concern for radiculopathy, but neuro exam normal. Checking LXR. Sending to PT and ortho. Continue to monitor. Recheck 1 month.     Relevant Orders    DG Lumbar Spine Complete    Ambulatory referral to Orthopedic Surgery    Pre-procedural examination        To have x-ray, so needs pregnancy. Testing today.    Relevant Orders    Pregnancy, urine        Follow up plan: Return in about 4 weeks (around 12/24/2015) for follow up hip pain.

## 2015-11-27 ENCOUNTER — Telehealth: Payer: Self-pay | Admitting: Family Medicine

## 2015-11-27 NOTE — Telephone Encounter (Signed)
Patient notified

## 2015-11-27 NOTE — Telephone Encounter (Signed)
Please let her know that her x-ray shows some arthritis, but not in the area that should be causing her pain. Have her try the PT and we'll check back in as planned.

## 2015-11-29 ENCOUNTER — Other Ambulatory Visit: Payer: Self-pay | Admitting: Family Medicine

## 2015-12-28 ENCOUNTER — Ambulatory Visit: Payer: Managed Care, Other (non HMO) | Admitting: Family Medicine

## 2016-01-03 ENCOUNTER — Ambulatory Visit: Payer: Managed Care, Other (non HMO) | Admitting: Family Medicine

## 2016-01-04 ENCOUNTER — Encounter: Payer: Self-pay | Admitting: Family Medicine

## 2016-01-17 ENCOUNTER — Ambulatory Visit
Admission: RE | Admit: 2016-01-17 | Discharge: 2016-01-17 | Disposition: A | Discharge status: Home / Self Care | Payer: Managed Care, Other (non HMO) | Source: Ambulatory Visit | Attending: Family Medicine | Admitting: Family Medicine

## 2016-01-17 ENCOUNTER — Other Ambulatory Visit: Payer: Self-pay | Admitting: Family Medicine

## 2016-01-17 DIAGNOSIS — G44311 Acute post-traumatic headache, intractable: Secondary | ICD-10-CM
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
## 2016-01-23 ENCOUNTER — Telehealth: Payer: Self-pay | Admitting: Family Medicine

## 2016-01-23 NOTE — Telephone Encounter (Signed)
Pt scheduled for 01/28/16 @ 8:45am. Thanks.
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
## 2016-01-23 NOTE — Telephone Encounter (Signed)
Please get patient scheduled for an appointment 

## 2016-01-28 ENCOUNTER — Encounter: Payer: Self-pay | Admitting: Family Medicine

## 2016-01-28 ENCOUNTER — Ambulatory Visit (INDEPENDENT_AMBULATORY_CARE_PROVIDER_SITE_OTHER): Payer: Managed Care, Other (non HMO) | Admitting: Family Medicine

## 2016-01-28 VITALS — BP 117/74 | HR 72 | Temp 97.9°F | Ht 62.1 in | Wt 215.0 lb

## 2016-01-28 DIAGNOSIS — S134XXA Sprain of ligaments of cervical spine, initial encounter: Secondary | ICD-10-CM | POA: Diagnosis not present

## 2016-01-28 MED ORDER — CYCLOBENZAPRINE HCL 5 MG PO TABS: 5.0000 mg | ORAL_TABLET | Freq: Every day | ORAL | Status: DC
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
## 2016-02-05 DIAGNOSIS — M542 Cervicalgia: Secondary | ICD-10-CM | POA: Insufficient documentation

## 2016-02-05 DIAGNOSIS — M25511 Pain in right shoulder: Secondary | ICD-10-CM | POA: Insufficient documentation

## 2016-02-05 DIAGNOSIS — M79601 Pain in right arm: Secondary | ICD-10-CM | POA: Insufficient documentation

## 2016-02-13 ENCOUNTER — Ambulatory Visit (INDEPENDENT_AMBULATORY_CARE_PROVIDER_SITE_OTHER): Payer: Managed Care, Other (non HMO) | Admitting: Family Medicine

## 2016-02-13 ENCOUNTER — Encounter: Payer: Self-pay | Admitting: Family Medicine

## 2016-02-13 VITALS — BP 111/79 | HR 78 | Temp 98.4°F | Ht 62.1 in | Wt 215.0 lb

## 2016-02-13 DIAGNOSIS — E538 Deficiency of other specified B group vitamins: Secondary | ICD-10-CM | POA: Diagnosis not present

## 2016-02-13 DIAGNOSIS — E119 Type 2 diabetes mellitus without complications: Secondary | ICD-10-CM

## 2016-02-13 LAB — UA/M W/RFLX CULTURE, ROUTINE
BILIRUBIN UA: NEGATIVE
Glucose, UA: NEGATIVE
Ketones, UA: NEGATIVE
Leukocytes, UA: NEGATIVE
Nitrite, UA: NEGATIVE
PH UA: 7 (ref 5.0–7.5)
PROTEIN UA: NEGATIVE
RBC UA: NEGATIVE
SPEC GRAV UA: 1.015 (ref 1.005–1.030)
UUROB: 2 mg/dL — AB (ref 0.2–1.0)

## 2016-02-13 LAB — BAYER DCA HB A1C WAIVED: HB A1C: 5.6 % (ref <7.0)

## 2016-02-13 LAB — MICROALBUMIN, URINE WAIVED
Creatinine, Urine Waived: 200 mg/dL (ref 10–300)
Microalb, Ur Waived: 30 mg/L — ABNORMAL HIGH (ref 0–19)
Microalb/Creat Ratio: <30 mg/g (ref <30)

## 2016-02-13 MED ORDER — CYANOCOBALAMIN 1000 MCG/ML IJ SOLN: INTRAMUSCULAR | Status: DC

## 2016-02-13 MED ORDER — ONDANSETRON HCL 4 MG PO TABS: 4.0000 mg | ORAL_TABLET | Freq: Three times a day (TID) | ORAL | Status: DC | PRN

## 2016-02-13 NOTE — Assessment & Plan Note (Signed)
No more hypoglycemia. A1c down to 5.6. Continue iron. Continue to monitor. Call with any concerns.

## 2016-02-13 NOTE — Assessment & Plan Note (Signed)
Labs checked today. Continue current regimen. Refill given today.

## 2016-02-13 NOTE — Progress Notes (Signed)
BP 111/79 mmHg  Pulse 78  Temp(Src) 98.4 F (36.9 C)  Ht 5' 2.1" (1.577 m)  Wt 215 lb (97.523 kg)  BMI 39.21 kg/m2  SpO2 98%  LMP 01/17/2016 (Exact Date)   Subjective:    Patient ID: Belinda [name], female    DOB: 01-Jun-1976, 40 y.o.   MRN: [id number]  HPI: Belinda [name] is a 40 y.o. female  Chief Complaint  Patient presents with  . Diabetes   Neck is getting better- saw a chiropractor and started on voltaren. Doing much better. Sleeping now. Legs are doing better- no problems any more and not taking zanaflex.   DIABETES Hypoglycemic episodes:no- seems like it has gone away with taking her iron pill Polydipsia/polyuria: no Visual disturbance: no Chest pain: no Paresthesias: no Glucose Monitoring: no Taking Insulin?: no Blood Pressure Monitoring: not checking Retinal Examination: Up to Date Foot Exam: Up to Date Diabetic Education: Completed Pneumovax: Up to Date Influenza: Up to date Aspirin: no  Relevant past medical, surgical, family and social history reviewed and updated as indicated. Interim medical history since our last visit reviewed. Allergies and medications reviewed and updated.  Review of Systems  Constitutional: Negative.   Respiratory: Negative.   Cardiovascular: Negative.   Psychiatric/Behavioral: Negative.     Per HPI unless specifically indicated above     Objective:    BP 111/79 mmHg  Pulse 78  Temp(Src) 98.4 F (36.9 C)  Ht 5' 2.1" (1.577 m)  Wt 215 lb (97.523 kg)  BMI 39.21 kg/m2  SpO2 98%  LMP 01/17/2016 (Exact Date)  Wt Readings from Last 3 Encounters:  02/13/16 215 lb (97.523 kg)  01/28/16 215 lb (97.523 kg)  11/26/15 212 lb (96.163 kg)    Physical Exam  Constitutional: She is oriented to person, place, and time. She appears well-developed and well-nourished. No distress.  HENT:  Head: Normocephalic and atraumatic.  Right Ear: Hearing normal.  Left Ear: Hearing normal.  Nose: Nose normal.  Eyes: Conjunctivae and lids  are normal. Right eye exhibits no discharge. Left eye exhibits no discharge. No scleral icterus.  Cardiovascular: Normal rate, regular rhythm, normal heart sounds and intact distal pulses.  Exam reveals no gallop and no friction rub.   No murmur heard. Pulmonary/Chest: Effort normal and breath sounds normal. No respiratory distress. She has no wheezes. She has no rales. She exhibits no tenderness.  Musculoskeletal: Normal range of motion.  Neurological: She is alert and oriented to person, place, and time.  Skin: Skin is warm, dry and intact. No rash noted. She is not diaphoretic. No erythema. No pallor.  Psychiatric: She has a normal mood and affect. Her speech is normal and behavior is normal. Judgment and thought content normal. Cognition and memory are normal.  Nursing note and vitals reviewed.   Results for orders placed or performed in visit on 11/26/15  Pregnancy, urine  Result Value Ref Range   Preg Test, Ur Negative Negative      Assessment & Plan:   Problem List Items Addressed This Visit      Endocrine   Diabetes (HCC) - Primary   Relevant Orders   Bayer DCA Hb A1c Waived   Comprehensive metabolic panel   Lipid Panel w/o Chol/HDL Ratio   CBC with Differential/Platelet   TSH   Microalbumin, Urine Waived   UA/M w/rflx Culture, Routine    Other Visit Diagnoses    B12 deficiency        Relevant Orders    B12  Follow up plan: Return in about 6 months (around 08/15/2016) for Physical.

## 2016-02-14 ENCOUNTER — Encounter: Payer: Self-pay | Admitting: Family Medicine

## 2016-02-14 LAB — CBC WITH DIFFERENTIAL/PLATELET
BASOS ABS: 0 10*3/uL (ref 0.0–0.2)
BASOS: 0 %
EOS (ABSOLUTE): 0.2 10*3/uL (ref 0.0–0.4)
Eos: 2 %
Hematocrit: 37.3 % (ref 34.0–46.6)
Hemoglobin: 12.3 g/dL (ref 11.1–15.9)
IMMATURE GRANS (ABS): 0 10*3/uL (ref 0.0–0.1)
IMMATURE GRANULOCYTES: 0 %
LYMPHS: 25 %
Lymphocytes Absolute: 2.4 10*3/uL (ref 0.7–3.1)
MCH: 29.8 pg (ref 26.6–33.0)
MCHC: 33 g/dL (ref 31.5–35.7)
MCV: 90 fL (ref 79–97)
MONOS ABS: 0.6 10*3/uL (ref 0.1–0.9)
Monocytes: 6 %
NEUTROS PCT: 67 %
Neutrophils Absolute: 6.3 10*3/uL (ref 1.4–7.0)
PLATELETS: 263 10*3/uL (ref 150–379)
RBC: 4.13 x10E6/uL (ref 3.77–5.28)
RDW: 14.3 % (ref 12.3–15.4)
WBC: 9.5 10*3/uL (ref 3.4–10.8)

## 2016-02-14 LAB — COMPREHENSIVE METABOLIC PANEL
ALBUMIN: 4.3 g/dL (ref 3.5–5.5)
ALT: 14 IU/L (ref 0–32)
AST: 14 IU/L (ref 0–40)
Albumin/Globulin Ratio: 1.9 (ref 1.2–2.2)
Alkaline Phosphatase: 78 IU/L (ref 39–117)
BUN / CREAT RATIO: 11 (ref 9–23)
BUN: 10 mg/dL (ref 6–24)
Bilirubin Total: 0.3 mg/dL (ref 0.0–1.2)
CALCIUM: 9 mg/dL (ref 8.7–10.2)
CO2: 22 mmol/L (ref 18–29)
CREATININE: 0.88 mg/dL (ref 0.57–1.00)
Chloride: 101 mmol/L (ref 96–106)
GFR, EST AFRICAN AMERICAN: 95 mL/min/{1.73_m2} (ref >59)
GFR, EST NON AFRICAN AMERICAN: 82 mL/min/{1.73_m2} (ref >59)
GLUCOSE: 90 mg/dL (ref 65–99)
Globulin, Total: 2.3 g/dL (ref 1.5–4.5)
Potassium: 4.8 mmol/L (ref 3.5–5.2)
Sodium: 139 mmol/L (ref 134–144)
TOTAL PROTEIN: 6.6 g/dL (ref 6.0–8.5)

## 2016-02-14 LAB — TSH: TSH: 2.54 u[IU]/mL (ref 0.450–4.500)

## 2016-02-14 LAB — LIPID PANEL W/O CHOL/HDL RATIO
Cholesterol, Total: 160 mg/dL (ref 100–199)
HDL: 70 mg/dL (ref >39)
LDL CALC: 74 mg/dL (ref 0–99)
Triglycerides: 79 mg/dL (ref 0–149)
VLDL CHOLESTEROL CAL: 16 mg/dL (ref 5–40)

## 2016-02-14 LAB — VITAMIN B12: VITAMIN B 12: 568 pg/mL (ref 211–946)

## 2016-02-15 ENCOUNTER — Other Ambulatory Visit: Payer: Self-pay | Admitting: Family Medicine

## 2016-02-21 ENCOUNTER — Telehealth: Payer: Self-pay | Admitting: Family Medicine

## 2016-02-21 NOTE — Telephone Encounter (Signed)
Pharmacy called and would like to have needles for pts b12 injections faxed over. The pt is new to them so they aren't sure which needles she uses.

## 2016-02-21 NOTE — Telephone Encounter (Signed)
Resent order form to pharmacy with needle size.

## 2016-02-29 ENCOUNTER — Telehealth: Payer: Self-pay | Admitting: Family Medicine

## 2016-02-29 MED ORDER — NORGESTIMATE-ETH ESTRADIOL 0.25-35 MG-MCG PO TABS: 1.0000 | ORAL_TABLET | Freq: Every day | ORAL | Status: DC

## 2016-02-29 NOTE — Telephone Encounter (Signed)
Pt would like to get sprintec [facility] [address]

## 2016-02-29 NOTE — Telephone Encounter (Signed)
Rx sent to her pharmacy 

## 2016-02-29 NOTE — Telephone Encounter (Signed)
Forward to provider

## 2016-03-10 ENCOUNTER — Telehealth: Payer: Self-pay | Admitting: Family Medicine

## 2016-03-10 NOTE — Telephone Encounter (Signed)
Pt called and stated that she missed class today due to a terrible headache and she would like to know if she can have a note.

## 2016-03-11 NOTE — Telephone Encounter (Signed)
Letter written. OK for her to come pick up. 

## 2016-03-11 NOTE — Telephone Encounter (Signed)
Forward to provider

## 2016-03-16 ENCOUNTER — Other Ambulatory Visit: Payer: Self-pay | Admitting: Family Medicine

## 2016-03-18 ENCOUNTER — Telehealth: Payer: Self-pay | Admitting: Family Medicine

## 2016-03-18 NOTE — Telephone Encounter (Signed)
Pt called and would like to know if her norgestimate-ethinyl estradiol (ORTHO-CYCLEN,SPRINTEC,PREVIFEM) 0.25-35 MG-MCG tablet was approved through her insurance.

## 2016-03-19 ENCOUNTER — Ambulatory Visit: Payer: Managed Care, Other (non HMO) | Admitting: Family Medicine

## 2016-03-24 ENCOUNTER — Other Ambulatory Visit: Payer: Self-pay | Admitting: Family Medicine

## 2016-03-26 ENCOUNTER — Encounter: Payer: Self-pay | Admitting: Family Medicine

## 2016-03-26 ENCOUNTER — Ambulatory Visit (INDEPENDENT_AMBULATORY_CARE_PROVIDER_SITE_OTHER): Payer: Managed Care, Other (non HMO) | Admitting: Family Medicine

## 2016-03-26 VITALS — BP 112/78 | HR 76 | Temp 98.3°F | Ht 62.1 in | Wt 214.0 lb

## 2016-03-26 DIAGNOSIS — R51 Headache: Secondary | ICD-10-CM

## 2016-03-26 DIAGNOSIS — R519 Headache, unspecified: Secondary | ICD-10-CM

## 2016-03-26 DIAGNOSIS — R110 Nausea: Secondary | ICD-10-CM | POA: Diagnosis not present

## 2016-03-26 MED ORDER — POLYETHYLENE GLYCOL 3350 17 GM/SCOOP PO POWD: 17.0000 g | Freq: Two times a day (BID) | ORAL | Status: DC | PRN

## 2016-03-26 MED ORDER — NORTRIPTYLINE HCL 25 MG PO CAPS: 25.0000 mg | ORAL_CAPSULE | Freq: Every day | ORAL | Status: DC

## 2016-03-26 NOTE — Progress Notes (Signed)
BP 112/78 mmHg  Pulse 76  Temp(Src) 98.3 F (36.8 C)  Ht 5' 2.1" (1.577 m)  Wt 214 lb (97.07 kg)  BMI 39.03 kg/m2  SpO2 99%  LMP 03/06/2016 (Approximate)   Subjective:    Patient ID: Belinda [name], female    DOB: May 23, 1976, 40 y.o.   MRN: [id number]  HPI: Belinda [name] is a 40 y.o. female  Chief Complaint  Patient presents with  . Migraine    Patient states that she started having headaches again about a month ago, for the last 3 weeks she has been having nausea and vomiting   MIGRAINES- came back about 3 weeks ago, had botox injections about a year ago and that worked really well.  Duration: 3 weeks Onset: sudden Severity: 8/10 Quality: dull aching in the top of her head Frequency: 1x a week Location:  Top of her head Headache duration: several hours Radiation: no Time of day headache occurs: at random Alleviating factors: rest Aggravating factors: nothing Headache status at time of visit: asymptomatic Treatments attempted: muscle relaxer and rest   Aura: no Nausea:  yes- not just associated with the headaches Vomiting: yes- not just associated with the headaches Photophobia:  yes Phonophobia:  yes Effect on social functioning:  yes Numbers of missed days of school/work each month: 3 Confusion:  no Gait disturbance/ataxia:  no Behavioral changes:  no Fevers:  no  When she was eating, felt like she was eating too much. Felt like she got really mucousy and got really nauseous. Then she throws up then she has belly pain for quite while after that. Zofran is not touching it. Still feels like she is eating too much even with smaller meals. Doesn't get nauseous with drinking. All foods make her nauseous. Not every time she eats, but usually at least 1x a day. Has been a little constipated.  Relevant past medical, surgical, family and social history reviewed and updated as indicated. Interim medical history since our last visit reviewed. Allergies and medications  reviewed and updated.  Review of Systems  Constitutional: Negative.   Respiratory: Negative.   Cardiovascular: Negative.   Gastrointestinal: Positive for nausea, vomiting, abdominal pain and constipation. Negative for diarrhea, blood in stool, abdominal distention, anal bleeding and rectal pain.  Neurological: Positive for headaches.  Psychiatric/Behavioral: Negative.     Per HPI unless specifically indicated above     Objective:    BP 112/78 mmHg  Pulse 76  Temp(Src) 98.3 F (36.8 C)  Ht 5' 2.1" (1.577 m)  Wt 214 lb (97.07 kg)  BMI 39.03 kg/m2  SpO2 99%  LMP 03/06/2016 (Approximate)  Wt Readings from Last 3 Encounters:  03/26/16 214 lb (97.07 kg)  02/13/16 215 lb (97.523 kg)  01/28/16 215 lb (97.523 kg)    Physical Exam  Constitutional: She is oriented to person, place, and time. She appears well-developed and well-nourished. No distress.  HENT:  Head: Normocephalic and atraumatic.  Right Ear: Hearing normal.  Left Ear: Hearing normal.  Nose: Nose normal.  Eyes: Conjunctivae and lids are normal. Pupils are equal, round, and reactive to light. Right eye exhibits no discharge. Left eye exhibits no discharge. No scleral icterus.  Pulmonary/Chest: Effort normal. No respiratory distress.  Abdominal: Soft. She exhibits no distension and no mass. There is no tenderness. There is no rebound and no guarding.  Musculoskeletal: Normal range of motion.  Neurological: She is alert and oriented to person, place, and time.  Skin: Skin is warm, dry and intact.  No rash noted. She is not diaphoretic. No erythema. No pallor.  Psychiatric: She has a normal mood and affect. Her speech is normal and behavior is normal. Judgment and thought content normal. Cognition and memory are normal.  Nursing note and vitals reviewed.   Results for orders placed or performed in visit on 02/13/16  Bayer DCA Hb A1c Waived  Result Value Ref Range   Bayer DCA Hb A1c Waived 5.6 <7.0 %  Comprehensive  metabolic panel  Result Value Ref Range   Glucose 90 65 - 99 mg/dL   BUN 10 6 - 24 mg/dL   Creatinine, Ser 1.61 0.57 - 1.00 mg/dL   GFR calc non Af Amer 82 >59 mL/min/1.73   GFR calc Af Amer 95 >59 mL/min/1.73   BUN/Creatinine Ratio 11 9 - 23   Sodium 139 134 - 144 mmol/L   Potassium 4.8 3.5 - 5.2 mmol/L   Chloride 101 96 - 106 mmol/L   CO2 22 18 - 29 mmol/L   Calcium 9.0 8.7 - 10.2 mg/dL   Total Protein 6.6 6.0 - 8.5 g/dL   Albumin 4.3 3.5 - 5.5 g/dL   Globulin, Total 2.3 1.5 - 4.5 g/dL   Albumin/Globulin Ratio 1.9 1.2 - 2.2   Bilirubin Total 0.3 0.0 - 1.2 mg/dL   Alkaline Phosphatase 78 39 - 117 IU/L   AST 14 0 - 40 IU/L   ALT 14 0 - 32 IU/L  Lipid Panel w/o Chol/HDL Ratio  Result Value Ref Range   Cholesterol, Total 160 100 - 199 mg/dL   Triglycerides 79 0 - 149 mg/dL   HDL 70 >09 mg/dL   VLDL Cholesterol Cal 16 5 - 40 mg/dL   LDL Calculated 74 0 - 99 mg/dL  CBC with Differential/Platelet  Result Value Ref Range   WBC 9.5 3.4 - 10.8 x10E3/uL   RBC 4.13 3.77 - 5.28 x10E6/uL   Hemoglobin 12.3 11.1 - 15.9 g/dL   Hematocrit 60.4 54.0 - 46.6 %   MCV 90 79 - 97 fL   MCH 29.8 26.6 - 33.0 pg   MCHC 33.0 31.5 - 35.7 g/dL   RDW 98.1 19.1 - 47.8 %   Platelets 263 150 - 379 x10E3/uL   Neutrophils 67 %   Lymphs 25 %   Monocytes 6 %   Eos 2 %   Basos 0 %   Neutrophils Absolute 6.3 1.4 - 7.0 x10E3/uL   Lymphocytes Absolute 2.4 0.7 - 3.1 x10E3/uL   Monocytes Absolute 0.6 0.1 - 0.9 x10E3/uL   EOS (ABSOLUTE) 0.2 0.0 - 0.4 x10E3/uL   Basophils Absolute 0.0 0.0 - 0.2 x10E3/uL   Immature Granulocytes 0 %   Immature Grans (Abs) 0.0 0.0 - 0.1 x10E3/uL  TSH  Result Value Ref Range   TSH 2.540 0.450 - 4.500 uIU/mL  Microalbumin, Urine Waived  Result Value Ref Range   Microalb, Ur Waived 30 (H) 0 - 19 mg/L   Creatinine, Urine Waived 200 10 - 300 mg/dL   Microalb/Creat Ratio <30 <30 mg/g  UA/M w/rflx Culture, Routine  Result Value Ref Range   Specific Gravity, UA 1.015 1.005 -  1.030   pH, UA 7.0 5.0 - 7.5   Color, UA Yellow Yellow   Appearance Ur Clear Clear   Leukocytes, UA Negative Negative   Protein, UA Negative Negative/Trace   Glucose, UA Negative Negative   Ketones, UA Negative Negative   RBC, UA Negative Negative   Bilirubin, UA Negative Negative   Urobilinogen, Ur 2.0 (  H) 0.2 - 1.0 mg/dL   Nitrite, UA Negative Negative  Vitamin B12  Result Value Ref Range   Vitamin B-12 568 211 - 946 pg/mL      Assessment & Plan:   Problem List Items Addressed This Visit      Other   Headache - Primary    Will increase her nortriptyline to 25mg  daily. If not improving, will get back into headache center for possible additonial botox injections. Call with any concerns.       Relevant Medications   nortriptyline (PAMELOR) 25 MG capsule    Other Visit Diagnoses    Nausea        Significant at this time. Has been constipated. Will get her cleared out and check in on Friday. If not better, consider referral to GI.        Follow up plan: No Follow-up on file.

## 2016-03-26 NOTE — Assessment & Plan Note (Signed)
Will increase her nortriptyline to 25mg  daily. If not improving, will get back into headache center for possible additonial botox injections. Call with any concerns.

## 2016-03-27 ENCOUNTER — Other Ambulatory Visit: Payer: Self-pay | Admitting: Family Medicine

## 2016-03-28 ENCOUNTER — Telehealth: Payer: Self-pay | Admitting: Family Medicine

## 2016-03-28 MED ORDER — POLYETHYLENE GLYCOL 3350 17 GM/SCOOP PO POWD: 17.0000 g | Freq: Two times a day (BID) | ORAL | Status: DC | PRN

## 2016-03-28 NOTE — Telephone Encounter (Signed)
Called Belinda [name]. She is doing better. Will continue her miralax. Continue to monitor.

## 2016-03-28 NOTE — Telephone Encounter (Signed)
-----   Message from Belinda CarrowMegan P Oswell Say, DO sent at 03/26/2016  9:50 AM EDT ----- Call her and see how her nausea's doing

## 2016-04-14 ENCOUNTER — Telehealth: Payer: Self-pay | Admitting: Family Medicine

## 2016-04-14 MED ORDER — OMEPRAZOLE 20 MG PO CPDR: 20.0000 mg | DELAYED_RELEASE_CAPSULE | Freq: Every day | ORAL | Status: DC

## 2016-04-14 NOTE — Telephone Encounter (Signed)
Pt called stated she has been seen for stomach problems she has been taking Omeprazole. Would like to know if she can get an RX for Omeprazole 10 mg delayed release. [facility] [address] [place].Pt also needs a note for missing class on 04/10/16 due to a migraine. Thanks.

## 2016-04-14 NOTE — Telephone Encounter (Signed)
Letter generated for her to come pick up

## 2016-04-14 NOTE — Telephone Encounter (Signed)
I did not see her on 7/6- so I cannot give her a note for that. I can give her a note stating that she gets migraines and is under treatment for that, but I cannot give her one for that day. The omeprazole is sent to her pharmacy. We usually prescribe 20mg - which is what I sent.

## 2016-04-14 NOTE — Telephone Encounter (Signed)
She would like a note stating that she does have the headaches, FYI:  this is the only time time that she has had a headache since she started the new medication.

## 2016-05-01 ENCOUNTER — Ambulatory Visit (INDEPENDENT_AMBULATORY_CARE_PROVIDER_SITE_OTHER): Payer: Managed Care, Other (non HMO) | Admitting: Family Medicine

## 2016-05-01 ENCOUNTER — Encounter: Payer: Self-pay | Admitting: Family Medicine

## 2016-05-01 VITALS — BP 117/78 | HR 125 | Temp 98.3°F | Wt 212.0 lb

## 2016-05-01 DIAGNOSIS — S39012A Strain of muscle, fascia and tendon of lower back, initial encounter: Secondary | ICD-10-CM | POA: Diagnosis not present

## 2016-05-01 MED ORDER — KETOROLAC TROMETHAMINE 60 MG/2ML IM SOLN
60.0000 mg | Freq: Once | INTRAMUSCULAR | Status: AC
  Administered 2016-05-01: 60 mg via INTRAMUSCULAR

## 2016-05-01 MED ORDER — MELOXICAM 15 MG PO TABS: 15.0000 mg | ORAL_TABLET | Freq: Every day | ORAL | 0 refills | Status: DC

## 2016-05-01 MED ORDER — TRAMADOL HCL 50 MG PO TABS: 50.0000 mg | ORAL_TABLET | Freq: Three times a day (TID) | ORAL | 0 refills | Status: DC | PRN

## 2016-05-01 MED ORDER — PREDNISONE 20 MG PO TABS: 40.0000 mg | ORAL_TABLET | Freq: Every day | ORAL | 0 refills | Status: DC

## 2016-05-01 NOTE — Progress Notes (Signed)
BP 117/78   Pulse (!) 125   Temp 98.3 F (36.8 C)   Wt 212 lb (96.2 kg)   LMP 04/04/2016 (Approximate)   SpO2 97%   BMI 38.65 kg/m    Subjective:    Patient ID: Belinda [name], female    DOB: 01/16/1976, 40 y.o.   MRN: [id number]  HPI: Belinda [name] is a 40 y.o. female  Chief Complaint  Patient presents with  . Back Pain    happened this morning, was putting up groceries and felt something pull in the lower rigth side of her back. did not hear a pop.    Was picking up groceries and twisting to set them down this morning and felt a sharp pain and pulling sensation. Has had severe right sided lbp with burning sensation since. States the pain radiates down to right knee with any movement. Weight bearing is severely painful. Denies bony tenderness, bowel/bladder incontinence, leg weakness, fever/chills. Has been taking ibuprofen since this morning with minimal relief.   States historical she has had many muscular issues like this before on this side, but never any bony injury.    Relevant past medical, surgical, family and social history reviewed and updated as indicated. Interim medical history since our last visit reviewed. Allergies and medications reviewed and updated.  Review of Systems  Constitutional: Negative.   Respiratory: Negative.   Cardiovascular: Negative.   Genitourinary: Negative.   Musculoskeletal: Positive for back pain, gait problem and myalgias.  Neurological: Negative for weakness and numbness.  Psychiatric/Behavioral: Negative.     Per HPI unless specifically indicated above     Objective:    BP 117/78   Pulse (!) 125   Temp 98.3 F (36.8 C)   Wt 212 lb (96.2 kg)   LMP 04/04/2016 (Approximate)   SpO2 97%   BMI 38.65 kg/m   Wt Readings from Last 3 Encounters:  05/01/16 212 lb (96.2 kg)  03/26/16 214 lb (97.1 kg)  02/13/16 215 lb (97.5 kg)    Physical Exam  Constitutional: She is oriented to person, place, and time. She appears  well-developed and well-nourished.  Appears in a lot of pain, sitting crooked in chair  HENT:  Head: Atraumatic.  Eyes: Conjunctivae are normal. No scleral icterus.  Neck: Normal range of motion. Neck supple.  Cardiovascular: Normal heart sounds and intact distal pulses.   Tachycardic  Pulmonary/Chest: Effort normal. No respiratory distress.  Musculoskeletal:  Antalgic gait favoring right leg TTP over piriformis on right - SLR ROM limited d/t patient discomfort No TTP over spine  Neurological: She is alert and oriented to person, place, and time.  Sensation full and equal b/l  Skin: Skin is warm and dry.  Psychiatric: She has a normal mood and affect. Her behavior is normal.  Nursing note and vitals reviewed.     Assessment & Plan:   Problem List Items Addressed This Visit    None    Visit Diagnoses    Back strain, initial encounter    -  Primary   Relevant Medications   ketorolac (TORADOL) injection 60 mg (Completed) (Start on 05/01/2016  4:45 PM)    Toradol IM given in office. Sent prednisone, tramadol, and meloxicam (discussed waiting until tomorrow or the next day to begin meloxicam). Biofreeze massage, gentle stretches, rest. Will refer to PT if she does not improve within the next two weeks.    Follow up plan: Return if symptoms worsen or fail to improve.

## 2016-05-01 NOTE — Patient Instructions (Signed)
Follow up as needed

## 2016-05-02 ENCOUNTER — Telehealth: Payer: Self-pay | Admitting: Family Medicine

## 2016-05-02 ENCOUNTER — Encounter: Payer: Self-pay | Admitting: Family Medicine

## 2016-05-02 NOTE — Telephone Encounter (Signed)
Pt called stated she needs a note for missing class last night. Please call pt when this is ready for pick up. Thanks.

## 2016-05-02 NOTE — Telephone Encounter (Signed)
Printed and ready for pick up. 

## 2016-05-02 NOTE — Telephone Encounter (Signed)
Patient notified

## 2016-05-02 NOTE — Telephone Encounter (Signed)
Routing to provider  

## 2016-05-06 ENCOUNTER — Telehealth: Payer: Self-pay

## 2016-05-06 MED ORDER — ONDANSETRON HCL 4 MG PO TABS: 4.0000 mg | ORAL_TABLET | Freq: Three times a day (TID) | ORAL | 1 refills | Status: DC | PRN

## 2016-05-06 NOTE — Telephone Encounter (Signed)
Received a refill request for ondansetron 4mg  tablet

## 2016-05-14 ENCOUNTER — Other Ambulatory Visit: Payer: Self-pay | Admitting: Family Medicine

## 2016-05-14 MED ORDER — TRAMADOL HCL 50 MG PO TABS: 50.0000 mg | ORAL_TABLET | Freq: Three times a day (TID) | ORAL | 0 refills | Status: DC | PRN

## 2016-05-14 NOTE — Telephone Encounter (Signed)
Pt called stated she needs a refill on Tramadol. Pharm [place] [facility] [address] [place]. Thanks.

## 2016-05-14 NOTE — Telephone Encounter (Signed)
Routing to provider  

## 2016-05-14 NOTE — Telephone Encounter (Signed)
Will send in 15 more, but she will need to be seen for any further controlled scripts because she should be improving at this point. Make sure she is utilizing the meloxicam. Tramadol printed and ready. Thanks

## 2016-05-14 NOTE — Telephone Encounter (Signed)
Patient notified, rx faxed 

## 2016-05-20 ENCOUNTER — Encounter: Payer: Self-pay | Admitting: Family Medicine

## 2016-05-20 ENCOUNTER — Other Ambulatory Visit: Payer: Self-pay

## 2016-05-20 ENCOUNTER — Ambulatory Visit (INDEPENDENT_AMBULATORY_CARE_PROVIDER_SITE_OTHER): Payer: Managed Care, Other (non HMO) | Admitting: Family Medicine

## 2016-05-20 VITALS — BP 99/69 | HR 80 | Temp 97.7°F | Wt 213.0 lb

## 2016-05-20 DIAGNOSIS — M545 Low back pain, unspecified: Secondary | ICD-10-CM

## 2016-05-20 MED ORDER — MELOXICAM 15 MG PO TABS: 15.0000 mg | ORAL_TABLET | Freq: Every day | ORAL | 3 refills | Status: DC

## 2016-05-20 MED ORDER — TRAMADOL HCL 50 MG PO TABS: 50.0000 mg | ORAL_TABLET | Freq: Three times a day (TID) | ORAL | 0 refills | Status: DC | PRN

## 2016-05-20 NOTE — Progress Notes (Signed)
BP 99/69   Pulse 80   Temp 97.7 F (36.5 C)   Wt 213 lb (96.6 kg)   LMP 04/04/2016 (Approximate)   SpO2 99%   BMI 38.83 kg/m    Subjective:    Patient ID: Belinda [name], female    DOB: 08-11-1976, 40 y.o.   MRN: [id number]  HPI: Belinda [name] is a 40 y.o. female  Chief Complaint  Patient presents with  . Back Pain    she injured it a couple weeks ago and it was finally improving, then last night, she was sitting in the floor and twisted wrong and now it's hurting again. Hurting in the same area as before, low back and goes around into her hips.   Patient presents for f/u of her back pain that she was seen for about 3 weeks ago. States she had completely resolved from prior episode and had been off of everything (tizanidine, tramadol, and meloxicam) for 3 to 4 days. Was arranging flowers on the floor last night and twisted wrong and is now having another flare. States it is much milder this time, but she is still in a good bit of pain and does not feel like she can go to class in current condition. Started back on all 3 medications this morning and is already having some relief. Has about 5 tramadol left.  Denies radiation down legs, difficulty walking, weakness, or bowel/bladder incontinence. No fever or chills.   Relevant past medical, surgical, family and social history reviewed and updated as indicated. Interim medical history since our last visit reviewed. Allergies and medications reviewed and updated.  Review of Systems  Constitutional: Negative.   Respiratory: Negative.   Cardiovascular: Negative.   Gastrointestinal: Negative.   Genitourinary: Negative.   Musculoskeletal: Positive for back pain.  Neurological: Negative.   Psychiatric/Behavioral: Negative.     Per HPI unless specifically indicated above     Objective:    BP 99/69   Pulse 80   Temp 97.7 F (36.5 C)   Wt 213 lb (96.6 kg)   LMP 04/04/2016 (Approximate)   SpO2 99%   BMI 38.83 kg/m   Wt  Readings from Last 3 Encounters:  05/20/16 213 lb (96.6 kg)  05/01/16 212 lb (96.2 kg)  03/26/16 214 lb (97.1 kg)    Physical Exam  Constitutional: She is oriented to person, place, and time. She appears well-developed and well-nourished. No distress.  HENT:  Head: Atraumatic.  Eyes: Conjunctivae are normal. No scleral icterus.  Neck: Normal range of motion. Neck supple.  Cardiovascular: Normal rate and normal heart sounds.   Pulmonary/Chest: Breath sounds normal. No respiratory distress.  Musculoskeletal: Normal range of motion. She exhibits no edema or tenderness.  Full ROM intact, but some discomfort with lateral rotation - SLR  Neurological: She is alert and oriented to person, place, and time.  Skin: Skin is warm and dry.  Psychiatric: She has a normal mood and affect. Her behavior is normal.      Assessment & Plan:   Problem List Items Addressed This Visit    None    Visit Diagnoses    Acute bilateral low back pain without sciatica    -  Primary   Restart tramadol, meloxicam, and tizanidine. Gentle stretches, warm epsom salt baths, massage, and core exercises.     If no better in the next 3-4 weeks, will consider orthopedic referral/imaging as this has been an ongoing episodic issue for her.    Follow up  plan: Return if symptoms worsen or fail to improve.

## 2016-05-20 NOTE — Patient Instructions (Signed)
Follow up as needed

## 2016-05-26 ENCOUNTER — Telehealth: Payer: Self-pay | Admitting: Family Medicine

## 2016-05-26 DIAGNOSIS — M545 Low back pain, unspecified: Secondary | ICD-10-CM

## 2016-05-26 NOTE — Telephone Encounter (Signed)
Patient notified, she'd like to avoid going to [facility], prefers [facility].

## 2016-05-26 NOTE — Telephone Encounter (Signed)
Routing to provider  

## 2016-05-26 NOTE — Telephone Encounter (Signed)
Referral placed, she should try to rely on the meloxicam and muscle relaxer until this evaluation. She has maxed out how much tramadol I can give her at this time.

## 2016-05-26 NOTE — Telephone Encounter (Signed)
Pt called stated she is still having issues with her back would like referral to Orthopedics. Please call pt if further information is needed. Pt would also like a refill on Tramadol. Pt stated she will be scheduling an appt. Thanks.

## 2016-05-28 DIAGNOSIS — M47816 Spondylosis without myelopathy or radiculopathy, lumbar region: Secondary | ICD-10-CM | POA: Insufficient documentation

## 2016-06-05 ENCOUNTER — Encounter: Payer: Self-pay | Admitting: Family Medicine

## 2016-06-06 ENCOUNTER — Encounter: Payer: Self-pay | Admitting: Family Medicine

## 2016-06-06 NOTE — Telephone Encounter (Signed)
Routing to provider  

## 2016-06-09 ENCOUNTER — Other Ambulatory Visit: Payer: Self-pay | Admitting: Family Medicine

## 2016-06-11 ENCOUNTER — Other Ambulatory Visit: Payer: Self-pay | Admitting: Family Medicine

## 2016-06-12 ENCOUNTER — Telehealth: Payer: Self-pay | Admitting: Family Medicine

## 2016-06-12 ENCOUNTER — Encounter: Payer: Self-pay | Admitting: Family Medicine

## 2016-06-12 NOTE — Telephone Encounter (Signed)
Can you please get patient scheduled for an appointment? Thanks!

## 2016-06-19 ENCOUNTER — Encounter: Payer: Self-pay | Admitting: Family Medicine

## 2016-06-19 ENCOUNTER — Ambulatory Visit (INDEPENDENT_AMBULATORY_CARE_PROVIDER_SITE_OTHER): Payer: Managed Care, Other (non HMO) | Admitting: Family Medicine

## 2016-06-19 VITALS — BP 120/86 | HR 103 | Temp 98.3°F | Wt 212.0 lb

## 2016-06-19 DIAGNOSIS — M545 Low back pain, unspecified: Secondary | ICD-10-CM

## 2016-06-19 MED ORDER — HYDROCODONE-IBUPROFEN 5-200 MG PO TABS: 1.0000 | ORAL_TABLET | Freq: Three times a day (TID) | ORAL | 0 refills | Status: DC | PRN

## 2016-06-19 NOTE — Progress Notes (Signed)
BP 120/86 (BP Location: Left Arm, Patient Position: Sitting, Cuff Size: Large)   Pulse (!) 103   Temp 98.3 F (36.8 C)   Wt 212 lb (96.2 kg)   SpO2 98%   BMI 38.65 kg/m    Subjective:    Patient ID: Belinda [name], female    DOB: 08-14-1976, 40 y.o.   MRN: [id number]  HPI: Belinda [name] is a 40 y.o. female  Chief Complaint  Patient presents with  . Back Pain   BACK PAIN Duration: 4-5 weeks Mechanism of injury: twisting Location: bilateral and low back Onset: sudden Severity: severe Quality: sharp and aching Frequency: constant Radiation: R leg above the knee Aggravating factors: lifting, movement, walking, laying, bending and prolonged sitting Alleviating factors: narcotics Status: stable Treatments attempted: rest, ice, heat, APAP, ibuprofen and aleve  Relief with NSAIDs?: mild Nighttime pain:  no Paresthesias / decreased sensation:  no Bowel / bladder incontinence:  no Fevers:  no Dysuria / urinary frequency:  no  Relevant past medical, surgical, family and social history reviewed and updated as indicated. Interim medical history since our last visit reviewed. Allergies and medications reviewed and updated.  Review of Systems  Constitutional: Negative.   Respiratory: Negative.   Cardiovascular: Negative.   Musculoskeletal: Positive for back pain and myalgias. Negative for arthralgias, gait problem, joint swelling, neck pain and neck stiffness.  Psychiatric/Behavioral: Negative.     Per HPI unless specifically indicated above     Objective:    BP 120/86 (BP Location: Left Arm, Patient Position: Sitting, Cuff Size: Large)   Pulse (!) 103   Temp 98.3 F (36.8 C)   Wt 212 lb (96.2 kg)   SpO2 98%   BMI 38.65 kg/m   Wt Readings from Last 3 Encounters:  06/19/16 212 lb (96.2 kg)  05/20/16 213 lb (96.6 kg)  05/01/16 212 lb (96.2 kg)    Physical Exam  Constitutional: She is oriented to person, place, and time. She appears well-developed and  well-nourished. No distress.  HENT:  Head: Normocephalic and atraumatic.  Right Ear: Hearing normal.  Left Ear: Hearing normal.  Nose: Nose normal.  Eyes: Conjunctivae and lids are normal. Right eye exhibits no discharge. Left eye exhibits no discharge. No scleral icterus.  Pulmonary/Chest: Effort normal. No respiratory distress.  Neurological: She is alert and oriented to person, place, and time.  Skin: Skin is warm, dry and intact. No rash noted. No erythema. No pallor.  Psychiatric: She has a normal mood and affect. Her speech is normal and behavior is normal. Judgment and thought content normal. Cognition and memory are normal.  Nursing note and vitals reviewed. Back Exam:    Inspection:  Normal spinal curvature.  No deformity, ecchymosis, erythema, or lesions     Palpation:     Midline spinal tenderness: no      Paralumbar tenderness: yes bilateral     Parathoracic tenderness: no      Buttocks tenderness: no     Range of Motion:      Flexion: Fingers to Knees     Extension:Decreased     Lateral bending:Decreased    Rotation:Decreased    Neuro Exam:Lower extremity DTRs normal & symmetric.  Strength and sensation intact.       Special Tests:      Straight leg raise:negative    Results for orders placed or performed in visit on 02/13/16  Bayer DCA Hb A1c Waived  Result Value Ref Range   Bayer DCA Hb A1c  Waived 5.6 <7.0 %  Comprehensive metabolic panel  Result Value Ref Range   Glucose 90 65 - 99 mg/dL   BUN 10 6 - 24 mg/dL   Creatinine, Ser 1.61 0.57 - 1.00 mg/dL   GFR calc non Af Amer 82 >59 mL/min/1.73   GFR calc Af Amer 95 >59 mL/min/1.73   BUN/Creatinine Ratio 11 9 - 23   Sodium 139 134 - 144 mmol/L   Potassium 4.8 3.5 - 5.2 mmol/L   Chloride 101 96 - 106 mmol/L   CO2 22 18 - 29 mmol/L   Calcium 9.0 8.7 - 10.2 mg/dL   Total Protein 6.6 6.0 - 8.5 g/dL   Albumin 4.3 3.5 - 5.5 g/dL   Globulin, Total 2.3 1.5 - 4.5 g/dL   Albumin/Globulin Ratio 1.9 1.2 - 2.2    Bilirubin Total 0.3 0.0 - 1.2 mg/dL   Alkaline Phosphatase 78 39 - 117 IU/L   AST 14 0 - 40 IU/L   ALT 14 0 - 32 IU/L  Lipid Panel w/o Chol/HDL Ratio  Result Value Ref Range   Cholesterol, Total 160 100 - 199 mg/dL   Triglycerides 79 0 - 149 mg/dL   HDL 70 >09 mg/dL   VLDL Cholesterol Cal 16 5 - 40 mg/dL   LDL Calculated 74 0 - 99 mg/dL  CBC with Differential/Platelet  Result Value Ref Range   WBC 9.5 3.4 - 10.8 x10E3/uL   RBC 4.13 3.77 - 5.28 x10E6/uL   Hemoglobin 12.3 11.1 - 15.9 g/dL   Hematocrit 60.4 54.0 - 46.6 %   MCV 90 79 - 97 fL   MCH 29.8 26.6 - 33.0 pg   MCHC 33.0 31.5 - 35.7 g/dL   RDW 98.1 19.1 - 47.8 %   Platelets 263 150 - 379 x10E3/uL   Neutrophils 67 %   Lymphs 25 %   Monocytes 6 %   Eos 2 %   Basos 0 %   Neutrophils Absolute 6.3 1.4 - 7.0 x10E3/uL   Lymphocytes Absolute 2.4 0.7 - 3.1 x10E3/uL   Monocytes Absolute 0.6 0.1 - 0.9 x10E3/uL   EOS (ABSOLUTE) 0.2 0.0 - 0.4 x10E3/uL   Basophils Absolute 0.0 0.0 - 0.2 x10E3/uL   Immature Granulocytes 0 %   Immature Grans (Abs) 0.0 0.0 - 0.1 x10E3/uL  TSH  Result Value Ref Range   TSH 2.540 0.450 - 4.500 uIU/mL  Microalbumin, Urine Waived  Result Value Ref Range   Microalb, Ur Waived 30 (H) 0 - 19 mg/L   Creatinine, Urine Waived 200 10 - 300 mg/dL   Microalb/Creat Ratio <30 <30 mg/g  UA/M w/rflx Culture, Routine  Result Value Ref Range   Specific Gravity, UA 1.015 1.005 - 1.030   pH, UA 7.0 5.0 - 7.5   Color, UA Yellow Yellow   Appearance Ur Clear Clear   Leukocytes, UA Negative Negative   Protein, UA Negative Negative/Trace   Glucose, UA Negative Negative   Ketones, UA Negative Negative   RBC, UA Negative Negative   Bilirubin, UA Negative Negative   Urobilinogen, Ur 2.0 (H) 0.2 - 1.0 mg/dL   Nitrite, UA Negative Negative  Vitamin B12  Result Value Ref Range   Vitamin B-12 568 211 - 946 pg/mL      Assessment & Plan:   Problem List Items Addressed This Visit    None    Visit Diagnoses     Bilateral low back pain without sciatica    -  Primary   Will get her  into PT- referral today. Start hydrocodone for pain. Recheck 1 month. Call with any concerns.    Relevant Medications   hydrocodone-ibuprofen (VICOPROFEN) 5-200 MG tablet       Follow up plan: Return in about 4 weeks (around 07/17/2016) for Follow up back.

## 2016-06-23 ENCOUNTER — Encounter: Payer: Self-pay | Admitting: Family Medicine

## 2016-06-24 ENCOUNTER — Other Ambulatory Visit: Payer: Self-pay | Admitting: Family Medicine

## 2016-06-24 DIAGNOSIS — M545 Low back pain, unspecified: Secondary | ICD-10-CM

## 2016-07-03 ENCOUNTER — Other Ambulatory Visit: Payer: Self-pay | Admitting: Family Medicine

## 2016-07-07 ENCOUNTER — Other Ambulatory Visit: Payer: Self-pay | Admitting: Family Medicine

## 2016-07-11 ENCOUNTER — Other Ambulatory Visit: Payer: Self-pay | Admitting: Family Medicine

## 2016-07-14 ENCOUNTER — Encounter: Payer: Self-pay | Admitting: Family Medicine

## 2016-07-14 NOTE — Telephone Encounter (Signed)
Routing to provider  

## 2016-07-15 ENCOUNTER — Other Ambulatory Visit: Payer: Self-pay | Admitting: Family Medicine

## 2016-07-15 MED ORDER — HYDROCODONE-IBUPROFEN 5-200 MG PO TABS: 1.0000 | ORAL_TABLET | Freq: Three times a day (TID) | ORAL | 0 refills | Status: DC | PRN

## 2016-07-15 NOTE — Telephone Encounter (Signed)
Routing to provider  

## 2016-07-18 ENCOUNTER — Ambulatory Visit: Payer: Managed Care, Other (non HMO) | Admitting: Family Medicine

## 2016-07-24 ENCOUNTER — Ambulatory Visit (INDEPENDENT_AMBULATORY_CARE_PROVIDER_SITE_OTHER): Payer: Managed Care, Other (non HMO) | Admitting: Family Medicine

## 2016-07-24 ENCOUNTER — Encounter: Payer: Self-pay | Admitting: Family Medicine

## 2016-07-24 VITALS — BP 117/81 | HR 97 | Temp 97.8°F | Wt 214.5 lb

## 2016-07-24 DIAGNOSIS — M545 Low back pain: Secondary | ICD-10-CM | POA: Diagnosis not present

## 2016-07-24 DIAGNOSIS — N6001 Solitary cyst of right breast: Secondary | ICD-10-CM

## 2016-07-24 DIAGNOSIS — G8929 Other chronic pain: Secondary | ICD-10-CM | POA: Diagnosis not present

## 2016-07-24 NOTE — Progress Notes (Signed)
BP 117/81 (BP Location: Left Arm, Patient Position: Sitting, Cuff Size: Large)   Pulse 97   Temp 97.8 F (36.6 C)   Wt 214 lb 8 oz (97.3 kg)   LMP 07/01/2016 (Approximate)   SpO2 97%   BMI 39.11 kg/m    Subjective:    Patient ID: Belinda [name], female    DOB: June 13, 1976, 40 y.o.   MRN: [id number]  HPI: Belinda [name] is a 40 y.o. female  Chief Complaint  Patient presents with  . Back Pain   BACK PAIN- getting much better with PT. Not needing meds as much any more. Doing better Duration: 8-9 weeks Mechanism of injury: twisting Location: bilateral and low back Onset: sudden Severity: moderate- getting much better Quality: sharp and aching Frequency: constant Radiation: R leg above the knee Aggravating factors: lifting, movement, walking, laying, bending and prolonged sitting Alleviating factors: narcotics Status: better Treatments attempted: rest, ice, heat, APAP, ibuprofen and aleve, PT  Relief with NSAIDs?: mild Nighttime pain:  no Paresthesias / decreased sensation:  no Bowel / bladder incontinence:  no Fevers:  no  Needs referral for mammogram. Has known breast cyst that only acts up around her cycle. They want her to have a diagnostic mammogram. No concerns or complaints at this time.   Relevant past medical, surgical, family and social history reviewed and updated as indicated. Interim medical history since our last visit reviewed. Allergies and medications reviewed and updated.  Review of Systems  Constitutional: Negative.   Respiratory: Negative.   Cardiovascular: Negative.   Musculoskeletal: Positive for back pain and myalgias. Negative for arthralgias, gait problem, joint swelling, neck pain and neck stiffness.  Psychiatric/Behavioral: Negative.     Per HPI unless specifically indicated above     Objective:    BP 117/81 (BP Location: Left Arm, Patient Position: Sitting, Cuff Size: Large)   Pulse 97   Temp 97.8 F (36.6 C)   Wt 214 lb 8 oz  (97.3 kg)   LMP 07/01/2016 (Approximate)   SpO2 97%   BMI 39.11 kg/m   Wt Readings from Last 3 Encounters:  07/24/16 214 lb 8 oz (97.3 kg)  06/19/16 212 lb (96.2 kg)  05/20/16 213 lb (96.6 kg)    Physical Exam  Constitutional: She is oriented to person, place, and time. She appears well-developed and well-nourished. No distress.  HENT:  Head: Normocephalic and atraumatic.  Right Ear: Hearing normal.  Left Ear: Hearing normal.  Nose: Nose normal.  Eyes: Conjunctivae and lids are normal. Right eye exhibits no discharge. Left eye exhibits no discharge. No scleral icterus.  Cardiovascular: Normal rate, regular rhythm and intact distal pulses.  Exam reveals no gallop and no friction rub.   No murmur heard. Pulmonary/Chest: Effort normal and breath sounds normal. No respiratory distress. She has no wheezes. She has no rales. She exhibits no tenderness.  Musculoskeletal: Normal range of motion.  Neurological: She is alert and oriented to person, place, and time.  Skin: Skin is warm, dry and intact. No rash noted. No erythema. No pallor.  Psychiatric: She has a normal mood and affect. Her speech is normal and behavior is normal. Judgment and thought content normal. Cognition and memory are normal.  Nursing note and vitals reviewed.   Results for orders placed or performed in visit on 02/13/16  Bayer DCA Hb A1c Waived  Result Value Ref Range   Bayer DCA Hb A1c Waived 5.6 <7.0 %  Comprehensive metabolic panel  Result Value Ref Range  Glucose 90 65 - 99 mg/dL   BUN 10 6 - 24 mg/dL   Creatinine, Ser 9.60 0.57 - 1.00 mg/dL   GFR calc non Af Amer 82 >59 mL/min/1.73   GFR calc Af Amer 95 >59 mL/min/1.73   BUN/Creatinine Ratio 11 9 - 23   Sodium 139 134 - 144 mmol/L   Potassium 4.8 3.5 - 5.2 mmol/L   Chloride 101 96 - 106 mmol/L   CO2 22 18 - 29 mmol/L   Calcium 9.0 8.7 - 10.2 mg/dL   Total Protein 6.6 6.0 - 8.5 g/dL   Albumin 4.3 3.5 - 5.5 g/dL   Globulin, Total 2.3 1.5 - 4.5 g/dL    Albumin/Globulin Ratio 1.9 1.2 - 2.2   Bilirubin Total 0.3 0.0 - 1.2 mg/dL   Alkaline Phosphatase 78 39 - 117 IU/L   AST 14 0 - 40 IU/L   ALT 14 0 - 32 IU/L  Lipid Panel w/o Chol/HDL Ratio  Result Value Ref Range   Cholesterol, Total 160 100 - 199 mg/dL   Triglycerides 79 0 - 149 mg/dL   HDL 70 >45 mg/dL   VLDL Cholesterol Cal 16 5 - 40 mg/dL   LDL Calculated 74 0 - 99 mg/dL  CBC with Differential/Platelet  Result Value Ref Range   WBC 9.5 3.4 - 10.8 x10E3/uL   RBC 4.13 3.77 - 5.28 x10E6/uL   Hemoglobin 12.3 11.1 - 15.9 g/dL   Hematocrit 40.9 81.1 - 46.6 %   MCV 90 79 - 97 fL   MCH 29.8 26.6 - 33.0 pg   MCHC 33.0 31.5 - 35.7 g/dL   RDW 91.4 78.2 - 95.6 %   Platelets 263 150 - 379 x10E3/uL   Neutrophils 67 %   Lymphs 25 %   Monocytes 6 %   Eos 2 %   Basos 0 %   Neutrophils Absolute 6.3 1.4 - 7.0 x10E3/uL   Lymphocytes Absolute 2.4 0.7 - 3.1 x10E3/uL   Monocytes Absolute 0.6 0.1 - 0.9 x10E3/uL   EOS (ABSOLUTE) 0.2 0.0 - 0.4 x10E3/uL   Basophils Absolute 0.0 0.0 - 0.2 x10E3/uL   Immature Granulocytes 0 %   Immature Grans (Abs) 0.0 0.0 - 0.1 x10E3/uL  TSH  Result Value Ref Range   TSH 2.540 0.450 - 4.500 uIU/mL  Microalbumin, Urine Waived  Result Value Ref Range   Microalb, Ur Waived 30 (H) 0 - 19 mg/L   Creatinine, Urine Waived 200 10 - 300 mg/dL   Microalb/Creat Ratio <30 <30 mg/g  UA/M w/rflx Culture, Routine  Result Value Ref Range   Specific Gravity, UA 1.015 1.005 - 1.030   pH, UA 7.0 5.0 - 7.5   Color, UA Yellow Yellow   Appearance Ur Clear Clear   Leukocytes, UA Negative Negative   Protein, UA Negative Negative/Trace   Glucose, UA Negative Negative   Ketones, UA Negative Negative   RBC, UA Negative Negative   Bilirubin, UA Negative Negative   Urobilinogen, Ur 2.0 (H) 0.2 - 1.0 mg/dL   Nitrite, UA Negative Negative  Vitamin B12  Result Value Ref Range   Vitamin B-12 568 211 - 946 pg/mL      Assessment & Plan:   Problem List Items Addressed This Visit     None    Visit Diagnoses    Chronic bilateral low back pain without sciatica    -  Primary   Improving. Continue current regimen. Continue to monitor. Call with any concerns.    Breast cyst, right  Mammogram and ultrasound ordered today. Call with any concerns.    Relevant Orders   MM Digital Diagnostic Bilat   US BREAST COMPLETE UNI RIGHT INC AXILLA   US BREAST COMPLETE UNI LEFT INC AXILLA       Follow up plan: Return As scheduled.

## 2016-07-28 ENCOUNTER — Other Ambulatory Visit: Payer: Self-pay | Admitting: Family Medicine

## 2016-07-28 DIAGNOSIS — R110 Nausea: Secondary | ICD-10-CM

## 2016-07-28 NOTE — Telephone Encounter (Signed)
Patient states that she is taking it at least once a day everyday

## 2016-07-28 NOTE — Telephone Encounter (Signed)
Can you please find out if she requested her zofran refilled, because it looks like she is taking 1-2 every day, and that is too much. If she needs it that often, she'll need to see GI.

## 2016-07-31 ENCOUNTER — Other Ambulatory Visit: Payer: Self-pay | Admitting: Family Medicine

## 2016-08-01 ENCOUNTER — Other Ambulatory Visit: Payer: Self-pay

## 2016-08-04 ENCOUNTER — Ambulatory Visit (INDEPENDENT_AMBULATORY_CARE_PROVIDER_SITE_OTHER): Payer: Managed Care, Other (non HMO) | Admitting: Gastroenterology

## 2016-08-04 ENCOUNTER — Other Ambulatory Visit: Payer: Self-pay

## 2016-08-04 ENCOUNTER — Encounter: Payer: Self-pay | Admitting: *Deleted

## 2016-08-04 ENCOUNTER — Encounter: Payer: Self-pay | Admitting: Gastroenterology

## 2016-08-04 VITALS — BP 133/86 | HR 74 | Temp 98.7°F | Ht 62.0 in | Wt 215.0 lb

## 2016-08-04 DIAGNOSIS — R1114 Bilious vomiting: Secondary | ICD-10-CM

## 2016-08-04 DIAGNOSIS — K219 Gastro-esophageal reflux disease without esophagitis: Secondary | ICD-10-CM | POA: Diagnosis not present

## 2016-08-04 MED ORDER — OMEPRAZOLE 20 MG PO CPDR: 40.0000 mg | DELAYED_RELEASE_CAPSULE | Freq: Every day | ORAL | Status: AC

## 2016-08-04 NOTE — Progress Notes (Signed)
Gastroenterology Consultation  Referring Provider:     Valerie Roys, DO Primary Care Physician:  Park Liter, DO Primary Gastroenterologist:  Dr. Jonathon Bellows  Reason for Consultation:     Nausea        HPI:   Belinda [name] is a 40 y.o. y/o female referred for consultation & management  by Dr. Park Liter, DO.    She says that she has had nausea since 2013 when she had her Roux en Y, on and off. Occurs every day , atleast once a day . Last 3-4 weeks has been throwing up .can occur at anytime of the day . She feels better after she throws up, no food in vomitus. No blood. Denies the sensation of food sitting in her stomach for a long time. She is on omeprazole 40 mg once day at night  which helps. Denies any abdominal pain. Denies any NSAID use. Denies any weight loss. She has gained about 7 lbs.   She takes Hydrocodone about twice a day for her leg pain but has not needed any for a few days.  Denies any marijuana use, no migraines presently.   Past Medical History:  Diagnosis Date  . Bipolar affective disorder ([place])   . Deviated septum   . Diabetes mellitus    type 2 -  diet controlled  . GERD (gastroesophageal reflux disease)   . Headache(784.0)   . Peptic ulcer   . Sleep apnea   . Substance abuse     Past Surgical History:  Procedure Laterality Date  . ESOPHAGOGASTRODUODENOSCOPY  08/05/2012   Procedure: ESOPHAGOGASTRODUODENOSCOPY (EGD);  Surgeon: Shann Medal, MD;  Location: [facility];  Service: General;  Laterality: N/A;  . 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 SEPTOPLASTY  2003    Prior to Admission medications   Medication Sig Start Date End Date Taking? Authorizing Provider  ARIPiprazole (ABILIFY) 30 MG tablet Take 30 mg by mouth at bedtime.  03/06/16  Yes Historical Provider, MD  B-D 3CC LUER-LOK SYR 25GX1"  25G X 1" 3 ML MISC  02/27/16  Yes Historical Provider, MD  benztropine (COGENTIN) 0.5 MG tablet  08/01/16  Yes Historical Provider, MD  blood glucose meter kit and supplies KIT Dispense based on patient and insurance preference. Use up to four times daily as directed. (FOR ICD-9 250.00, 250.01). 06/26/15  Yes Megan P Johnson, DO  buPROPion (WELLBUTRIN XL) 150 MG 24 hr tablet Take 450 mg by mouth daily.    Yes Historical Provider, MD  cyanocobalamin (,VITAMIN B-12,) 1000 MCG/ML injection INJECT 1ML EVERY 2 WEEKS 02/13/16  Yes Megan P Johnson, DO  glucose blood test strip Use as instructed 06/26/15  Yes Megan P Johnson, DO  hydrocodone-ibuprofen (VICOPROFEN) 5-200 MG tablet Take 1 tablet by mouth every 8 (eight) hours as needed for pain. 07/15/16  Yes Megan P Johnson, DO  lamoTRIgine (LAMICTAL) 150 MG tablet  07/31/16  Yes Historical Provider, MD  lamoTRIgine (LAMICTAL) 200 MG tablet Take 200 mg by mouth at bedtime.   Yes Historical Provider, MD  meloxicam (MOBIC) 15 MG tablet  07/21/16  Yes Historical Provider, MD  norgestimate-ethinyl estradiol (ORTHO-CYCLEN,SPRINTEC,PREVIFEM) 0.25-35 MG-MCG tablet Take 1 tablet by mouth daily. 02/29/16  Yes Megan P Johnson, DO  nortriptyline (PAMELOR) 25 MG capsule  06/04/16  Yes Historical Provider, MD  omeprazole (PRILOSEC) 20 MG  capsule Take 1 capsule (20 mg total) by mouth daily. 04/14/16  Yes Megan P Johnson, DO  ondansetron (ZOFRAN) 4 MG tablet TAKE ONE TABLET BY MOUTH EVERY 8 HOURS AS NEEDED FOR NAUSEA 07/28/16  Yes Megan P Johnson, DO  polyethylene glycol powder (GLYCOLAX/MIRALAX) powder  07/03/16  Yes Historical Provider, MD  sertraline (ZOLOFT) 100 MG tablet Take 200 mg by mouth daily.   Yes Historical Provider, MD  tiZANidine (ZANAFLEX) 4 MG capsule TAKE ONE CAPSULE BY MOUTH 3 TIMES A DAY AS NEEDED FOR MUSCLE SPASMS 02/15/16  Yes Kathrine Haddock, NP  zaleplon (SONATA) 10 MG capsule Take by mouth.   Yes Historical Provider, MD  glucose (B-D GLUCOSE) 5 G chewable  tablet Chew 3 tablets (15 g total) by mouth as needed for low blood sugar. 06/26/15 06/25/16  Valerie Roys, DO    Family History  Problem Relation Age of Onset  . Diabetes Mother   . Cancer Mother 49    brain, skin  . Cancer Father 20    kidney, bone, rectal  . Hyperlipidemia Father   . Hypertension Father   . Stroke Father   . Cancer Paternal Aunt     breast  . Diabetes Brother   . Allergies Sister      Social History  Substance Use Topics  . Smoking status: Never Smoker  . Smokeless tobacco: Never Used  . Alcohol use No    Allergies as of 08/04/2016 - Review Complete 08/04/2016  Allergen Reaction Noted  . Depakote [divalproex sodium] Other (See Comments) 06/15/2015  . Acetaminophen Nausea Only 07/17/2011    Review of Systems:    All systems reviewed and negative except where noted in HPI.   Physical Exam:  BP 133/86 (BP Location: Right Arm, Patient Position: Sitting)   Pulse 74   Temp 98.7 F (37.1 C) (Oral)   Ht 5' 2" (1.575 m)   Wt 215 lb (97.5 kg)   LMP 07/01/2016 (Approximate)   BMI 39.32 kg/m  Patient's last menstrual period was 07/01/2016 (approximate). Psych:  Alert and cooperative. Normal mood and affect. General:   Alert,  Well-developed, well-nourished, pleasant and cooperative in NAD Head:  Normocephalic and atraumatic. Eyes:  Sclera clear, no icterus.   Conjunctiva pink. Ears:  Normal auditory acuity. Nose:  No deformity, discharge, or lesions. Mouth:  No deformity or lesions,oropharynx pink & moist. Neck:  Supple; no masses or thyromegaly. Lungs:  Respirations even and unlabored.  Clear throughout to auscultation.   No wheezes, crackles, or rhonchi. No acute distress. Heart:  Regular rate and rhythm; no murmurs, clicks, rubs, or gallops. Abdomen:  Normal bowel sounds.  No bruits.  Soft, non-tender and non-distended without masses, hepatosplenomegaly or hernias noted.  No guarding or rebound tenderness.    Msk:  Symmetrical without gross  deformities. Good, equal movement & strength bilaterally. Pulses:  Normal pulses noted. Extremities:  No clubbing or edema.  No cyanosis. Neurologic:  Alert and oriented x3;  grossly normal neurologically. Skin:  Intact without significant lesions or rashes.  No jaundice. Lymph Nodes:  No significant cervical adenopathy. Psych:  Alert and cooperative. Normal mood and affect.  Imaging Studies: No results found.  Assessment and Plan:   MURLEAN SEELYE is a 40 y.o. y/o female has been referred for chronic nausea since 2013 . Recently a bit more worse. She has gained weight . Good control of her sugars by her history .   Plan:  1. Suggest to use PPI first thing in  the morning on an empty stomach  2. EGD to r/o ulcers/ anastamotic stricture.   If above negative and does not help then will need to consider a gastric emptying study.   Follow up in 8 weeks   Dr Jonathon Bellows MD

## 2016-08-04 NOTE — Patient Instructions (Addendum)

## 2016-08-06 ENCOUNTER — Encounter
Admission: RE | Disposition: A | Discharge status: Home / Self Care | Payer: Self-pay | Source: Ambulatory Visit | Attending: Gastroenterology

## 2016-08-06 ENCOUNTER — Ambulatory Visit: Payer: Managed Care, Other (non HMO) | Admitting: Anesthesiology

## 2016-08-06 ENCOUNTER — Ambulatory Visit
Admission: RE | Admit: 2016-08-06 | Discharge: 2016-08-06 | Disposition: A | Discharge status: Home / Self Care | Payer: Managed Care, Other (non HMO) | Source: Ambulatory Visit | Attending: Gastroenterology | Admitting: Gastroenterology

## 2016-08-06 DIAGNOSIS — G4730 Sleep apnea, unspecified: Secondary | ICD-10-CM | POA: Diagnosis not present

## 2016-08-06 DIAGNOSIS — Z8349 Family history of other endocrine, nutritional and metabolic diseases: Secondary | ICD-10-CM | POA: Insufficient documentation

## 2016-08-06 DIAGNOSIS — R1310 Dysphagia, unspecified: Secondary | ICD-10-CM | POA: Diagnosis not present

## 2016-08-06 DIAGNOSIS — Z8051 Family history of malignant neoplasm of kidney: Secondary | ICD-10-CM | POA: Diagnosis not present

## 2016-08-06 DIAGNOSIS — R110 Nausea: Secondary | ICD-10-CM | POA: Diagnosis present

## 2016-08-06 DIAGNOSIS — Z9884 Bariatric surgery status: Secondary | ICD-10-CM | POA: Insufficient documentation

## 2016-08-06 DIAGNOSIS — K219 Gastro-esophageal reflux disease without esophagitis: Secondary | ICD-10-CM | POA: Insufficient documentation

## 2016-08-06 DIAGNOSIS — E119 Type 2 diabetes mellitus without complications: Secondary | ICD-10-CM | POA: Diagnosis not present

## 2016-08-06 DIAGNOSIS — Z888 Allergy status to other drugs, medicaments and biological substances status: Secondary | ICD-10-CM | POA: Insufficient documentation

## 2016-08-06 DIAGNOSIS — Z833 Family history of diabetes mellitus: Secondary | ICD-10-CM | POA: Insufficient documentation

## 2016-08-06 DIAGNOSIS — Z79899 Other long term (current) drug therapy: Secondary | ICD-10-CM | POA: Diagnosis not present

## 2016-08-06 DIAGNOSIS — Z9889 Other specified postprocedural states: Secondary | ICD-10-CM | POA: Insufficient documentation

## 2016-08-06 DIAGNOSIS — F319 Bipolar disorder, unspecified: Secondary | ICD-10-CM | POA: Diagnosis not present

## 2016-08-06 DIAGNOSIS — Z886 Allergy status to analgesic agent status: Secondary | ICD-10-CM | POA: Diagnosis not present

## 2016-08-06 DIAGNOSIS — Z823 Family history of stroke: Secondary | ICD-10-CM | POA: Insufficient documentation

## 2016-08-06 DIAGNOSIS — Z808 Family history of malignant neoplasm of other organs or systems: Secondary | ICD-10-CM | POA: Insufficient documentation

## 2016-08-06 DIAGNOSIS — Z7982 Long term (current) use of aspirin: Secondary | ICD-10-CM | POA: Diagnosis not present

## 2016-08-06 DIAGNOSIS — Z803 Family history of malignant neoplasm of breast: Secondary | ICD-10-CM | POA: Diagnosis not present

## 2016-08-06 DIAGNOSIS — Z791 Long term (current) use of non-steroidal anti-inflammatories (NSAID): Secondary | ICD-10-CM | POA: Insufficient documentation

## 2016-08-06 DIAGNOSIS — M47896 Other spondylosis, lumbar region: Secondary | ICD-10-CM | POA: Insufficient documentation

## 2016-08-06 HISTORY — PX: ESOPHAGOGASTRODUODENOSCOPY (EGD) WITH PROPOFOL: SHX5813

## 2016-08-06 HISTORY — DX: Unspecified osteoarthritis, unspecified site: M19.90

## 2016-08-06 LAB — GLUCOSE, CAPILLARY: Glucose-Capillary: 105 mg/dL — ABNORMAL HIGH (ref 65–99)

## 2016-08-06 SURGERY — ESOPHAGOGASTRODUODENOSCOPY (EGD) WITH PROPOFOL
Anesthesia: Monitor Anesthesia Care | Wound class: Clean Contaminated

## 2016-08-06 MED ORDER — LIDOCAINE HCL (CARDIAC) 20 MG/ML IV SOLN
INTRAVENOUS | Status: DC | PRN
  Administered 2016-08-06: 50 mg via INTRAVENOUS

## 2016-08-06 MED ORDER — LACTATED RINGERS IV SOLN
INTRAVENOUS | Status: DC
  Administered 2016-08-06: 07:00:00 via INTRAVENOUS

## 2016-08-06 MED ORDER — PROPOFOL 10 MG/ML IV BOLUS
INTRAVENOUS | Status: DC | PRN
  Administered 2016-08-06: 100 mg via INTRAVENOUS
  Administered 2016-08-06 (×2): 50 mg via INTRAVENOUS

## 2016-08-06 MED ORDER — GLYCOPYRROLATE 0.2 MG/ML IJ SOLN
INTRAMUSCULAR | Status: DC | PRN
  Administered 2016-08-06: 0.2 mg via INTRAVENOUS

## 2016-08-06 SURGICAL SUPPLY — 32 items
BALLN DILATOR 10-12 8 (BALLOONS)
BALLN DILATOR 12-15 8 (BALLOONS)
BALLN DILATOR 15-18 8 (BALLOONS)
BALLN DILATOR CRE 0-12 8 (BALLOONS)
BALLN DILATOR ESOPH 8 10 CRE (MISCELLANEOUS) IMPLANT
BALLOON DILATOR 12-15 8 (BALLOONS) IMPLANT
BALLOON DILATOR 15-18 8 (BALLOONS) IMPLANT
BALLOON DILATOR CRE 0-12 8 (BALLOONS) IMPLANT
BLOCK BITE 60FR ADLT L/F GRN (MISCELLANEOUS) ×2 IMPLANT
CANISTER SUCT 1200ML W/VALVE (MISCELLANEOUS) ×2 IMPLANT
CLIP HMST 235XBRD CATH ROT (MISCELLANEOUS) IMPLANT
CLIP RESOLUTION 360 11X235 (MISCELLANEOUS)
FCP ESCP3.2XJMB 240X2.8X (MISCELLANEOUS)
FORCEPS BIOP RAD 4 LRG CAP 4 (CUTTING FORCEPS) IMPLANT
FORCEPS BIOP RJ4 240 W/NDL (MISCELLANEOUS)
FORCEPS ESCP3.2XJMB 240X2.8X (MISCELLANEOUS) IMPLANT
GOWN CVR UNV OPN BCK APRN NK (MISCELLANEOUS) ×2 IMPLANT
GOWN ISOL THUMB LOOP REG UNIV (MISCELLANEOUS) ×4
INJECTOR VARIJECT VIN23 (MISCELLANEOUS) IMPLANT
KIT DEFENDO VALVE AND CONN (KITS) IMPLANT
KIT ENDO PROCEDURE OLY (KITS) ×2 IMPLANT
MARKER SPOT ENDO TATTOO 5ML (MISCELLANEOUS) IMPLANT
PAD GROUND ADULT SPLIT (MISCELLANEOUS) IMPLANT
RETRIEVER NET PLAT FOOD (MISCELLANEOUS) IMPLANT
SNARE SHORT THROW 13M SML OVAL (MISCELLANEOUS) IMPLANT
SNARE SHORT THROW 30M LRG OVAL (MISCELLANEOUS) IMPLANT
SPOT EX ENDOSCOPIC TATTOO (MISCELLANEOUS)
SYR INFLATION 60ML (SYRINGE) IMPLANT
TRAP ETRAP POLY (MISCELLANEOUS) IMPLANT
VARIJECT INJECTOR VIN23 (MISCELLANEOUS)
WATER STERILE IRR 250ML POUR (IV SOLUTION) ×2 IMPLANT
WIRE CRE 18-20MM 8CM F G (MISCELLANEOUS) IMPLANT

## 2016-08-06 NOTE — Anesthesia Preprocedure Evaluation (Signed)
Anesthesia Evaluation  Patient identified by MRN, date of birth, ID band Patient awake    Reviewed: Allergy & Precautions, H&P , NPO status , Patient's Chart, lab work & pertinent test results  Airway Mallampati: II  TM Distance: >3 FB Neck ROM: full    Dental no notable dental hx.    Pulmonary sleep apnea ,    Pulmonary exam normal        Cardiovascular Normal cardiovascular exam     Neuro/Psych  Headaches,    GI/Hepatic GERD  ,  Endo/Other  diabetes  Renal/GU      Musculoskeletal   Abdominal   Peds  Hematology   Anesthesia Other Findings   Reproductive/Obstetrics                             Anesthesia Physical Anesthesia Plan  ASA: II  Anesthesia Plan: MAC   Post-op Pain Management:    Induction:   Airway Management Planned:   Additional Equipment:   Intra-op Plan:   Post-operative Plan:   Informed Consent: I have reviewed the patients History and Physical, chart, labs and discussed the procedure including the risks, benefits and alternatives for the proposed anesthesia with the patient or authorized representative who has indicated his/her understanding and acceptance.     Plan Discussed with:   Anesthesia Plan Comments:         Anesthesia Quick Evaluation

## 2016-08-06 NOTE — Anesthesia Postprocedure Evaluation (Signed)
Anesthesia Post Note  Patient: Belinda LevanSusan D [name]  Procedure(s) Performed: Procedure(s) (LRB): ESOPHAGOGASTRODUODENOSCOPY (EGD) WITH PROPOFOL (N/A)  Patient location during evaluation: PACU Anesthesia Type: MAC Level of consciousness: awake and alert and oriented Pain management: satisfactory to patient Vital Signs Assessment: post-procedure vital signs reviewed and stable Respiratory status: spontaneous breathing, nonlabored ventilation and respiratory function stable Cardiovascular status: blood pressure returned to baseline and stable Postop Assessment: Adequate PO intake and No signs of nausea or vomiting Anesthetic complications: no    Cherly BeachStella, Lilly Gasser J

## 2016-08-06 NOTE — Transfer of Care (Signed)
Immediate Anesthesia Transfer of Care Note  Patient: Belinda [name]  Procedure(s) Performed: Procedure(s): ESOPHAGOGASTRODUODENOSCOPY (EGD) WITH PROPOFOL (N/A)  Patient Location: PACU  Anesthesia Type: MAC  Level of Consciousness: awake, alert  and patient cooperative  Airway and Oxygen Therapy: Patient Spontanous Breathing and Patient connected to supplemental oxygen  Post-op Assessment: Post-op Vital signs reviewed, Patient's Cardiovascular Status Stable, Respiratory Function Stable, Patent Airway and No signs of Nausea or vomiting  Post-op Vital Signs: Reviewed and stable  Complications: No apparent anesthesia complications

## 2016-08-06 NOTE — H&P (Signed)
Jonathon Bellows MD [address] [place], [place] [address] Phone: [phone] Fax : [phone]  Primary Care Physician:  Park Liter, DO Primary Gastroenterologist:  Dr. Jonathon Bellows   Pre-Procedure History & Physical: HPI:  Belinda [name] is a 40 y.o. female is here for an endoscopy.   Past Medical History:  Diagnosis Date  . Arthritis    lower back  . Bipolar affective disorder ([place])   . Deviated septum   . Diabetes mellitus    type 2 -  diet controlled  . GERD (gastroesophageal reflux disease)   . Headache(784.0)    migraines - none 2 months  . Peptic ulcer   . Sleep apnea   . Substance abuse     Past Surgical History:  Procedure Laterality Date  . ESOPHAGOGASTRODUODENOSCOPY  08/05/2012   Procedure: ESOPHAGOGASTRODUODENOSCOPY (EGD);  Surgeon: Shann Medal, MD;  Location: [facility];  Service: General;  Laterality: N/A;  . 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 SEPTOPLASTY  2003    Prior to Admission medications   Medication Sig Start Date End Date Taking? Authorizing Provider  ARIPiprazole (ABILIFY) 30 MG tablet Take 30 mg by mouth at bedtime.  03/06/16  Yes Historical Provider, MD  aspirin 81 MG tablet Take 81 mg by mouth daily.   Yes Historical Provider, MD  benztropine (COGENTIN) 0.5 MG tablet  08/01/16  Yes Historical Provider, MD  buPROPion (WELLBUTRIN XL) 150 MG 24 hr tablet Take 450 mg by mouth daily.    Yes Historical Provider, MD  cyanocobalamin (,VITAMIN B-12,) 1000 MCG/ML injection INJECT 1ML EVERY 2 WEEKS 02/13/16  Yes Megan P Johnson, DO  hydrocodone-ibuprofen (VICOPROFEN) 5-200 MG tablet Take 1 tablet by mouth every 8 (eight) hours as needed for pain. 07/15/16  Yes Megan P Johnson, DO  lamoTRIgine (LAMICTAL) 150 MG tablet 300 mg daily.  07/31/16  Yes Historical Provider, MD  norgestimate-ethinyl  estradiol (ORTHO-CYCLEN,SPRINTEC,PREVIFEM) 0.25-35 MG-MCG tablet Take 1 tablet by mouth daily. 02/29/16  Yes Megan P Johnson, DO  ondansetron (ZOFRAN) 4 MG tablet TAKE ONE TABLET BY MOUTH EVERY 8 HOURS AS NEEDED FOR NAUSEA 07/28/16  Yes Megan P Johnson, DO  tiZANidine (ZANAFLEX) 4 MG capsule TAKE ONE CAPSULE BY MOUTH 3 TIMES A DAY AS NEEDED FOR MUSCLE SPASMS 02/15/16  Yes Kathrine Haddock, NP  zaleplon (SONATA) 10 MG capsule Take by mouth.   Yes Historical Provider, MD  B-D 3CC LUER-LOK SYR 25GX1" 25G X 1" 3 ML MISC  02/27/16   Historical Provider, MD  blood glucose meter kit and supplies KIT Dispense based on patient and insurance preference. Use up to four times daily as directed. (FOR ICD-9 250.00, 250.01). 06/26/15   Megan P Johnson, DO  glucose (B-D GLUCOSE) 5 G chewable tablet Chew 3 tablets (15 g total) by mouth as needed for low blood sugar. 06/26/15 06/25/16  Megan P Johnson, DO  glucose blood test strip Use as instructed 06/26/15   Valerie Roys, DO  nortriptyline (PAMELOR) 25 MG capsule  06/04/16   Historical Provider, MD  polyethylene glycol powder (GLYCOLAX/MIRALAX) powder  07/03/16   Historical Provider, MD  sertraline (ZOLOFT) 100 MG tablet Take 200 mg by mouth daily.    Historical Provider, MD    Allergies as of 08/04/2016 - Review Complete 08/04/2016  Allergen Reaction Noted  . Depakote [divalproex sodium] Other (See Comments) 06/15/2015  .  Acetaminophen Nausea Only 07/17/2011    Family History  Problem Relation Age of Onset  . Diabetes Mother   . Cancer Mother 13    brain, skin  . Cancer Father 73    kidney, bone, rectal  . Hyperlipidemia Father   . Hypertension Father   . Stroke Father   . Cancer Paternal Aunt     breast  . Diabetes Brother   . Allergies Sister     Social History   Social History  . Marital status: Married    Spouse name: N/A  . Number of children: N/A  . Years of education: N/A   Occupational History  . Not on file.   Social History Main Topics   . Smoking status: Never Smoker  . Smokeless tobacco: Never Used  . Alcohol use 3.6 oz/week    6 Glasses of wine per week  . Drug use: No  . Sexual activity: Yes    Birth control/ protection: None   Other Topics Concern  . Not on file   Social History Narrative  . No narrative on file    Review of Systems: See HPI, otherwise negative ROS  Physical Exam: BP 107/61   Pulse 76   Temp 97.6 F (36.4 C) (Temporal)   Ht 5' 2"  (1.575 m)   Wt 213 lb (96.6 kg)   LMP 07/25/2016 (Approximate) Comment: preg test negative  SpO2 98%   BMI 38.96 kg/m  General:   Alert,  pleasant and cooperative in NAD Head:  Normocephalic and atraumatic. Neck:  Supple; no masses or thyromegaly. Lungs:  Clear throughout to auscultation.    Heart:  Regular rate and rhythm. Abdomen:  Soft, nontender and nondistended. Normal bowel sounds, without guarding, and without rebound.   Neurologic:  Alert and  oriented x4;  grossly normal neurologically.  Impression/Plan: Belinda [name] is here for an endoscopy to be performed for nausea  Risks, benefits, limitations, and alternatives regarding  endoscopy have been reviewed with the patient.  Questions have been answered.  All parties agreeable.   Jonathon Bellows, MD  08/06/2016, 8:10 AM

## 2016-08-06 NOTE — Anesthesia Procedure Notes (Signed)
Procedure Name: MAC Performed by: Azlan Hanway Pre-anesthesia Checklist: Patient identified, Emergency Drugs available, Suction available, Timeout performed and Patient being monitored Patient Re-evaluated:Patient Re-evaluated prior to inductionOxygen Delivery Method: Nasal cannula Placement Confirmation: positive ETCO2     

## 2016-08-06 NOTE — Op Note (Signed)
[facility] Gastroenterology Patient Name: Belinda PlantSusan Struble Procedure Date: 08/06/2016 7:58 AM MRN: [id number] Account #: [id number] Date of Birth: 1975-12-04 Admit Type: Ambulatory Age: 40 Room: [facility] OR ROOM 01 Gender: Female Note Status: Finalized Procedure:            Upper GI endoscopy Indications:          Dysphagia, Nausea Providers:            Wyline MoodKiran Trask Vosler MD, MD, Mick SellKaren P. Lyn HollingsheadAlexander MD, MD Referring MD:         Dorcas CarrowMegan P. Johnson (Referring MD) Medicines:            Monitored Anesthesia Care Complications:        No immediate complications. Procedure:            Pre-Anesthesia Assessment:                       - Prior to the procedure, a History and Physical was                        performed, and patient medications, allergies and                        sensitivities were reviewed. The patient's tolerance of                        previous anesthesia was reviewed.                       - Prior to the procedure, a History and Physical was                        performed, and patient medications, allergies and                        sensitivities were reviewed. The patient's tolerance of                        previous anesthesia was reviewed.                       - ASA Grade Assessment: II - A patient with mild                        systemic disease.                       After obtaining informed consent, the endoscope was                        passed under direct vision. Throughout the procedure,                        the patient's blood pressure, pulse, and oxygen                        saturations were monitored continuously. The Olympus                        GIF H180J colonscope (H#:[id number](S#:[id number]) was introduced  through the mouth, and advanced to the jejunum. The                        upper GI endoscopy was accomplished with ease. The                        patient tolerated the procedure well. Findings:      The esophagus was  normal.      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 This       was characterized by healthy appearing mucosa. On contact there was mild       bleeding that stopped .      The examined jejunum was normal. Normal anastamosis, no stricture or       ulceration Impression:           - Normal esophagus.                       - A Roux-en-Y anastomosis was found, characterized by                        healthy appearing mucosa.                       - Normal examined jejunum.                       - The examination was otherwise normal.                       - No specimens collected. Recommendation:       - Resume regular diet today.                       - Will plan to obtain gastric emptying study , commence                        on Prilosec 40 mg once day if not already on Procedure Code(s):    --- Professional ---                       [id number], Esophagogastroduodenoscopy, flexible, transoral;                        diagnostic, including collection of specimen(s) by                        brushing or washing, when performed (separate procedure) Diagnosis Code(s):    --- Professional ---                       U04.54Z98.84, Bariatric surgery status                       R11.0, Nausea CPT copyright 2016 American Medical Association. All rights reserved. The codes documented in this report are preliminary and upon coder review may  be revised to meet current compliance requirements. Wyline MoodKiran Tigran Haynie, MD Wyline MoodKiran Diogo Anne MD, MD 08/06/2016 8:38:54 AM This report has been signed electronically. Antony HasteKaren P Alexander MD, MD Number of Addenda: 0 Note Initiated On: 08/06/2016 7:58 AM Total Procedure Duration: 0 hours 5 minutes 4 seconds       [facility]

## 2016-08-07 ENCOUNTER — Encounter: Payer: Self-pay | Admitting: Gastroenterology

## 2016-08-12 ENCOUNTER — Encounter: Payer: Self-pay | Admitting: Family Medicine

## 2016-08-13 ENCOUNTER — Other Ambulatory Visit: Payer: Self-pay | Admitting: Family Medicine

## 2016-08-15 ENCOUNTER — Encounter: Payer: Managed Care, Other (non HMO) | Admitting: Family Medicine

## 2016-08-18 ENCOUNTER — Other Ambulatory Visit: Payer: Private Health Insurance - Indemnity

## 2016-08-18 ENCOUNTER — Ambulatory Visit: Payer: Private Health Insurance - Indemnity | Attending: Family Medicine

## 2016-08-18 ENCOUNTER — Inpatient Hospital Stay: Admission: RE | Admit: 2016-08-18 | Payer: Private Health Insurance - Indemnity | Source: Ambulatory Visit

## 2016-08-25 ENCOUNTER — Other Ambulatory Visit: Payer: Self-pay | Admitting: Family Medicine

## 2016-09-02 ENCOUNTER — Telehealth: Payer: Self-pay

## 2016-09-02 ENCOUNTER — Other Ambulatory Visit: Payer: Self-pay

## 2016-09-02 DIAGNOSIS — R110 Nausea: Secondary | ICD-10-CM

## 2016-09-02 NOTE — Telephone Encounter (Signed)
Dr, Tobi BastosAnna did an EGD on the patient and told her he wanted to do a test where they see how fast the food is moving down her stomach but it's been 2 weeks and she hasn't heard back from Dr. Tobi BastosAnna. Patient wants to know if she needs to make an appointment or what she needs to do to have that done. Please call the patient and advice

## 2016-09-02 NOTE — Telephone Encounter (Signed)
Pt scheduled for a gastric empt[facility], [place] location on Tuesday, Dec 5th @ 7:15am. Pt has been instructed to be NPO after midnight and to hold all stomach medications 8 hours prior. Pt verbalized understanding of these instructions.

## 2016-09-04 ENCOUNTER — Other Ambulatory Visit: Payer: Self-pay | Admitting: Family Medicine

## 2016-09-05 ENCOUNTER — Other Ambulatory Visit: Payer: Self-pay

## 2016-09-05 DIAGNOSIS — Z01818 Encounter for other preprocedural examination: Secondary | ICD-10-CM

## 2016-09-05 DIAGNOSIS — R110 Nausea: Secondary | ICD-10-CM

## 2016-09-08 ENCOUNTER — Other Ambulatory Visit: Payer: Self-pay | Admitting: Family Medicine

## 2016-09-08 ENCOUNTER — Encounter: Payer: Self-pay | Admitting: Family Medicine

## 2016-09-08 ENCOUNTER — Ambulatory Visit (INDEPENDENT_AMBULATORY_CARE_PROVIDER_SITE_OTHER): Payer: Managed Care, Other (non HMO) | Admitting: Family Medicine

## 2016-09-08 VITALS — BP 119/80 | HR 98 | Temp 98.3°F | Ht 63.5 in | Wt 215.0 lb

## 2016-09-08 DIAGNOSIS — E119 Type 2 diabetes mellitus without complications: Secondary | ICD-10-CM

## 2016-09-08 DIAGNOSIS — Z0001 Encounter for general adult medical examination with abnormal findings: Secondary | ICD-10-CM

## 2016-09-08 DIAGNOSIS — N898 Other specified noninflammatory disorders of vagina: Secondary | ICD-10-CM

## 2016-09-08 DIAGNOSIS — Z01818 Encounter for other preprocedural examination: Secondary | ICD-10-CM

## 2016-09-08 DIAGNOSIS — Z0000 Encounter for general adult medical examination without abnormal findings: Secondary | ICD-10-CM

## 2016-09-08 DIAGNOSIS — Z124 Encounter for screening for malignant neoplasm of cervix: Secondary | ICD-10-CM

## 2016-09-08 DIAGNOSIS — E162 Hypoglycemia, unspecified: Secondary | ICD-10-CM

## 2016-09-08 LAB — UA/M W/RFLX CULTURE, ROUTINE
BILIRUBIN UA: NEGATIVE
Glucose, UA: NEGATIVE
Ketones, UA: NEGATIVE
Leukocytes, UA: NEGATIVE
NITRITE UA: NEGATIVE
PH UA: 6 (ref 5.0–7.5)
PROTEIN UA: NEGATIVE
Specific Gravity, UA: 1.005 (ref 1.005–1.030)
UUROB: 0.2 mg/dL (ref 0.2–1.0)

## 2016-09-08 LAB — PREGNANCY, URINE: Preg Test, Ur: NEGATIVE

## 2016-09-08 LAB — MICROSCOPIC EXAMINATION: WBC UA: NONE SEEN /HPF (ref 0–?)

## 2016-09-08 LAB — WET PREP FOR TRICH, YEAST, CLUE
Clue Cell Exam: NEGATIVE
TRICHOMONAS EXAM: NEGATIVE
Yeast Exam: NEGATIVE

## 2016-09-08 LAB — HEMOGLOBIN A1C: Hemoglobin A1C: 5.8

## 2016-09-08 LAB — BAYER DCA HB A1C WAIVED: HB A1C: 5.8 % (ref <7.0)

## 2016-09-08 LAB — MICROALBUMIN, URINE WAIVED
Creatinine, Urine Waived: 50 mg/dL (ref 10–300)
Microalb, Ur Waived: 10 mg/L (ref 0–19)
Microalb/Creat Ratio: <30 mg/g (ref <30)

## 2016-09-08 MED ORDER — GLUCOSE BLOOD VI STRP: ORAL_STRIP | 12 refills | Status: DC

## 2016-09-08 MED ORDER — GLUCOSE 5 G PO CHEW: 15.0000 g | CHEWABLE_TABLET | ORAL | 12 refills | Status: DC | PRN

## 2016-09-08 MED ORDER — FLUCONAZOLE 150 MG PO TABS: 150.0000 mg | ORAL_TABLET | Freq: Once | ORAL | 0 refills | Status: AC

## 2016-09-08 NOTE — Assessment & Plan Note (Signed)
Interested in weight loss medication. Due to commodities, this is difficult. Will check with psychiatry regarding treatments.

## 2016-09-08 NOTE — Assessment & Plan Note (Signed)
A1c came back at 5.8. Continue to monitor sugars. Continue to monitor.

## 2016-09-08 NOTE — Progress Notes (Signed)
BP 119/80 (BP Location: Left Arm, Patient Position: Sitting, Cuff Size: Large)   Pulse 98   Temp 98.3 F (36.8 C)   Ht 5' 3.5" (1.613 m)   Wt 215 lb (97.5 kg)   SpO2 99%   BMI 37.49 kg/m    Subjective:    Patient ID: Belinda [name], female    DOB: [birth year], 40 y.o.   MRN: [id number]  HPI: Belinda [name] is a 40 y.o. female presenting on 09/08/2016 for comprehensive medical examination. Current medical complaints include:  DIABETES Hypoglycemic episodes:yes Polydipsia/polyuria: no Visual disturbance: no Chest pain: no Paresthesias: no Glucose Monitoring: yes  Accucheck frequency: Daily  Fasting glucose: 50s-60s  Post prandial: 110 Taking Insulin?: no Blood Pressure Monitoring: not checking Retinal Examination: Not up to Date Foot Exam: Up to Date Diabetic Education: Completed Pneumovax: Not up to Date Influenza: Not up to Date Aspirin: no  Having a gastric emptying study done tomorrow though Dr. Georgeann Oppenheim office. Still experiencing nausea on a daily basis.   She currently lives with: husband Menopausal Symptoms: no  Depression Screen done today and results listed below:  Depression screen [facility] 2/9 02/13/2016 08/09/2015  Decreased Interest 0 0  Down, Depressed, Hopeless 0 0  PHQ - 2 Score 0 0   Past Medical History:  Past Medical History:  Diagnosis Date  . Arthritis    lower back  . Bipolar affective disorder ([place])   . Deviated septum   . Diabetes mellitus    type 2 -  diet controlled  . GERD (gastroesophageal reflux disease)   . Headache(784.0)    migraines - none 2 months  . Peptic ulcer   . Sleep apnea   . Substance abuse     Surgical History:  Past Surgical History:  Procedure Laterality Date  . ESOPHAGOGASTRODUODENOSCOPY  08/05/2012   Procedure: ESOPHAGOGASTRODUODENOSCOPY (EGD);  Surgeon: Shann Medal, MD;  Location: [facility];  Service: General;  Laterality: N/A;  . ESOPHAGOGASTRODUODENOSCOPY (EGD) WITH PROPOFOL N/A 08/06/2016   Procedure: ESOPHAGOGASTRODUODENOSCOPY (EGD) WITH PROPOFOL;  Surgeon: Jonathon Bellows, MD;  Location: [place];  Service: Endoscopy;  Laterality: N/A;  . 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 SEPTOPLASTY  2003    Medications:  Current Outpatient Prescriptions on File Prior to Visit  Medication Sig  . ARIPiprazole (ABILIFY) 30 MG tablet Take 30 mg by mouth at bedtime.   Marland Kitchen aspirin 81 MG tablet Take 81 mg by mouth daily.  . B-D 3CC LUER-LOK SYR 25GX1" 25G X 1" 3 ML MISC   . benztropine (COGENTIN) 0.5 MG tablet   . blood glucose meter kit and supplies KIT Dispense based on patient and insurance preference. Use up to four times daily as directed. (FOR ICD-9 250.00, 250.01).  Marland Kitchen buPROPion (WELLBUTRIN XL) 150 MG 24 hr tablet Take 450 mg by mouth daily.   . cyanocobalamin (,VITAMIN B-12,) 1000 MCG/ML injection INJECT 1ML EVERY 2 WEEKS  . lamoTRIgine (LAMICTAL) 150 MG tablet 300 mg daily.   . norgestimate-ethinyl estradiol (ORTHO-CYCLEN,SPRINTEC,PREVIFEM) 0.25-35 MG-MCG tablet Take 1 tablet by mouth daily.  . ondansetron (ZOFRAN) 4 MG tablet TAKE ONE TABLET BY MOUTH EVERY 8 HOURS AS NEEDED FOR NAUSEA  . polyethylene glycol powder (GLYCOLAX/MIRALAX) powder   . sertraline (ZOLOFT) 100 MG tablet Take 200 mg by mouth daily.  Marland Kitchen tiZANidine (ZANAFLEX) 4 MG capsule TAKE ONE CAPSULE  BY MOUTH 3 TIMES A DAY AS NEEDED FOR MUSCLE SPASMS  . zaleplon (SONATA) 10 MG capsule Take by mouth.   No current facility-administered medications on file prior to visit.     Allergies:  Allergies  Allergen Reactions  . Depakote [Divalproex Sodium] Other (See Comments)    Tremors  . Acetaminophen Nausea Only    Social History:  Social History   Social History  . Marital status: Married    Spouse name: N/A  . Number of children: N/A  . Years of education: N/A    Occupational History  . Not on file.   Social History Main Topics  . Smoking status: Never Smoker  . Smokeless tobacco: Never Used  . Alcohol use No  . Drug use: No  . Sexual activity: Yes    Birth control/ protection: None   Other Topics Concern  . Not on file   Social History Narrative  . No narrative on file   History  Smoking Status  . Never Smoker  Smokeless Tobacco  . Never Used   History  Alcohol Use No    Family History:  Family History  Problem Relation Age of Onset  . Diabetes Mother   . Cancer Mother 48    brain, skin  . Cancer Father 84    kidney, bone, rectal  . Hyperlipidemia Father   . Hypertension Father   . Stroke Father   . Cancer Paternal Aunt     breast  . Diabetes Brother   . Allergies Sister     Past medical history, surgical history, medications, allergies, family history and social history reviewed with patient today and changes made to appropriate areas of the chart.   Review of Systems  Constitutional: Negative.   HENT: Positive for ear pain (L ear- just started today). Negative for congestion, ear discharge, hearing loss, nosebleeds, sinus pain, sore throat and tinnitus.   Eyes: Negative.   Respiratory: Positive for cough. Negative for hemoptysis, sputum production, shortness of breath, wheezing and stridor.   Cardiovascular: Negative.   Gastrointestinal: Positive for nausea (better with consuming more liquids) and vomiting. Negative for abdominal pain, blood in stool, constipation, diarrhea, heartburn and melena.  Genitourinary: Negative.   Musculoskeletal: Negative.   Skin: Negative.   Neurological: Positive for dizziness. Negative for tingling, tremors, sensory change, speech change, focal weakness, seizures, loss of consciousness and headaches.  Endo/Heme/Allergies: Negative.   Psychiatric/Behavioral: Negative.     All other ROS negative except what is listed above and in the HPI.      Objective:    BP 119/80 (BP  Location: Left Arm, Patient Position: Sitting, Cuff Size: Large)   Pulse 98   Temp 98.3 F (36.8 C)   Ht 5' 3.5" (1.613 m)   Wt 215 lb (97.5 kg)   SpO2 99%   BMI 37.49 kg/m   Wt Readings from Last 3 Encounters:  09/08/16 215 lb (97.5 kg)  08/06/16 213 lb (96.6 kg)  08/04/16 215 lb (97.5 kg)    Physical Exam  Constitutional: She is oriented to person, place, and time. She appears well-developed and well-nourished. No distress.  HENT:  Head: Normocephalic and atraumatic.  Right Ear: Hearing, tympanic membrane, external ear and ear canal normal.  Left Ear: Hearing, tympanic membrane, external ear and ear canal normal.  Nose: Nose normal.  Mouth/Throat: Uvula is midline, oropharynx is clear and moist and mucous membranes are normal. No oropharyngeal exudate.  Eyes: Conjunctivae, EOM and lids are normal. Pupils are  equal, round, and reactive to light. Right eye exhibits no discharge. Left eye exhibits no discharge. No scleral icterus.  Neck: Normal range of motion. Neck supple. No JVD present. No tracheal deviation present. No thyromegaly present.  Cardiovascular: Normal rate, regular rhythm, normal heart sounds and intact distal pulses.  Exam reveals no gallop and no friction rub.   No murmur heard. Pulmonary/Chest: Effort normal and breath sounds normal. No stridor. No respiratory distress. She has no wheezes. She has no rales. She exhibits no tenderness. Right breast exhibits no inverted nipple, no mass, no nipple discharge, no skin change and no tenderness. Left breast exhibits no inverted nipple, no mass, no nipple discharge, no skin change and no tenderness. Breasts are symmetrical.  Abdominal: Soft. Bowel sounds are normal. She exhibits no distension and no mass. There is no tenderness. There is no rebound and no guarding. Hernia confirmed negative in the right inguinal area and confirmed negative in the left inguinal area.  Genitourinary: Uterus normal. No labial fusion. There is no  rash, tenderness, lesion or injury on the right labia. There is no rash, tenderness, lesion or injury on the left labia. Uterus is not deviated, not enlarged, not fixed and not tender. Cervix exhibits discharge. Cervix exhibits no motion tenderness and no friability. Right adnexum displays no mass, no tenderness and no fullness. Left adnexum displays no mass, no tenderness and no fullness. No erythema, tenderness or bleeding in the vagina. No foreign body in the vagina. No signs of injury around the vagina. Vaginal discharge found.  Musculoskeletal: Normal range of motion. She exhibits no edema, tenderness or deformity.  Lymphadenopathy:    She has no cervical adenopathy.  Neurological: She is alert and oriented to person, place, and time. She has normal reflexes. She displays normal reflexes. No cranial nerve deficit. She exhibits normal muscle tone. Coordination normal.  Skin: Skin is warm, dry and intact. No rash noted. She is not diaphoretic. No erythema. No pallor.  Psychiatric: She has a normal mood and affect. Her speech is normal and behavior is normal. Judgment and thought content normal. Cognition and memory are normal.  Nursing note reviewed.   Results for orders placed or performed in visit on 09/08/16  Hemoglobin A1c  Result Value Ref Range   Hemoglobin A1C 5.8       Assessment & Plan:   Problem List Items Addressed This Visit      Endocrine   Diabetes ([place])    A1c came back at 5.8. Continue to monitor sugars. Continue to monitor.       Relevant Medications   glucose (B-D GLUCOSE) 5 g chewable tablet   glucose blood test strip    Other Visit Diagnoses    Routine general medical examination at a health care facility    -  Primary   Up to date/declines vaccines. Screening labs checked today. Continue diet and exercise. Pap done today. Mammogram ordered today. Call with any concerns.    Preprocedural examination       Needs urine pregnancy for gastric emptying study.  Negative today.   Relevant Orders   Pregnancy, urine   Encounter for screening for cervical cancer        Pap done today.    Relevant Orders   IGP, Aptima HPV, rfx 16/18,45   Hypoglycemia       See DM   Relevant Medications   glucose (B-D GLUCOSE) 5 g chewable tablet   glucose blood test strip   Vaginal discharge  Wet prep negative, but strong suspicion for yeast. Will treat with diflucan. Call with any concerns.    Relevant Orders   WET PREP FOR [place], YEAST, CLUE       Follow up plan: Return in about 6 months (around 03/09/2017) for Follow up Sugars.   LABORATORY TESTING:  - Pap smear: pap done  IMMUNIZATIONS:   - Tdap: Tetanus vaccination status reviewed: last tetanus booster 1 years ago. - Influenza: Refused - Pneumovax: Refused  SCREENING: -Mammogram: Ordered, awaiting appointment   PATIENT COUNSELING:   Advised to take 1 mg of folate supplement per day if capable of pregnancy.   Sexuality: Discussed sexually transmitted diseases, partner selection, use of condoms, avoidance of unintended pregnancy  and contraceptive alternatives.   Advised to avoid cigarette smoking.  I discussed with the patient that most people either abstain from alcohol or drink within safe limits (<=14/week and <=4 drinks/occasion for males, <=7/weeks and <= 3 drinks/occasion for females) and that the risk for alcohol disorders and other health effects rises proportionally with the number of drinks per week and how often a drinker exceeds daily limits.  Discussed cessation/primary prevention of drug use and availability of treatment for abuse.   Diet: Encouraged to adjust caloric intake to maintain  or achieve ideal body weight, to reduce intake of dietary saturated fat and total fat, to limit sodium intake by avoiding high sodium foods and not adding table salt, and to maintain adequate dietary potassium and calcium preferably from fresh fruits, vegetables, and low-fat dairy products.     stressed the importance of regular exercise  Injury prevention: Discussed safety belts, safety helmets, smoke detector, smoking near bedding or upholstery.   Dental health: Discussed importance of regular tooth brushing, flossing, and dental visits.    NEXT PREVENTATIVE PHYSICAL DUE IN 1 YEAR. Return in about 6 months (around 03/09/2017) for Follow up Sugars.

## 2016-09-08 NOTE — Patient Instructions (Addendum)
Belviq Contrave Topamax  Health Maintenance, Female Introduction Adopting a healthy lifestyle and getting preventive care can go a long way to promote health and wellness. Talk with your health care provider about what schedule of regular examinations is right for you. This is a good chance for you to check in with your provider about disease prevention and staying healthy. In between checkups, there are plenty of things you can do on your own. Experts have done a lot of research about which lifestyle changes and preventive measures are most likely to keep you healthy. Ask your health care provider for more information. Weight and diet Eat a healthy diet  Be sure to include plenty of vegetables, fruits, low-fat dairy products, and lean protein.  Do not eat a lot of foods high in solid fats, added sugars, or salt.  Get regular exercise. This is one of the most important things you can do for your health.  Most adults should exercise for at least 150 minutes each week. The exercise should increase your heart rate and make you sweat (moderate-intensity exercise).  Most adults should also do strengthening exercises at least twice a week. This is in addition to the moderate-intensity exercise. Maintain a healthy weight  Body mass index (BMI) is a measurement that can be used to identify possible weight problems. It estimates body fat based on height and weight. Your health care provider can help determine your BMI and help you achieve or maintain a healthy weight.  For females 63 years of age and older:  A BMI below 18.5 is considered underweight.  A BMI of 18.5 to 24.9 is normal.  A BMI of 25 to 29.9 is considered overweight.  A BMI of 30 and above is considered obese. Watch levels of cholesterol and blood lipids  You should start having your blood tested for lipids and cholesterol at 40 years of age, then have this test every 5 years.  You may need to have your cholesterol levels  checked more often if:  Your lipid or cholesterol levels are high.  You are older than 40 years of age.  You are at high risk for heart disease. Cancer screening Lung Cancer  Lung cancer screening is recommended for adults 5-53 years old who are at high risk for lung cancer because of a history of smoking.  A yearly low-dose CT scan of the lungs is recommended for people who:  Currently smoke.  Have quit within the past 15 years.  Have at least a 30-pack-year history of smoking. A pack year is smoking an average of one pack of cigarettes a day for 1 year.  Yearly screening should continue until it has been 15 years since you quit.  Yearly screening should stop if you develop a health problem that would prevent you from having lung cancer treatment. Breast Cancer  Practice breast self-awareness. This means understanding how your breasts normally appear and feel.  It also means doing regular breast self-exams. Let your health care provider know about any changes, no matter how small.  If you are in your 20s or 30s, you should have a clinical breast exam (CBE) by a health care provider every 1-3 years as part of a regular health exam.  If you are 54 or older, have a CBE every year. Also consider having a breast X-ray (mammogram) every year.  If you have a family history of breast cancer, talk to your health care provider about genetic screening.  If you are at high  risk for breast cancer, talk to your health care provider about having an MRI and a mammogram every year.  Breast cancer gene (BRCA) assessment is recommended for women who have family members with BRCA-related cancers. BRCA-related cancers include:  Breast.  Ovarian.  Tubal.  Peritoneal cancers.  Results of the assessment will determine the need for genetic counseling and BRCA1 and BRCA2 testing. Cervical Cancer  Your health care provider may recommend that you be screened regularly for cancer of the pelvic  organs (ovaries, uterus, and vagina). This screening involves a pelvic examination, including checking for microscopic changes to the surface of your cervix (Pap test). You may be encouraged to have this screening done every 3 years, beginning at age 64.  For women ages 25-65, health care providers may recommend pelvic exams and Pap testing every 3 years, or they may recommend the Pap and pelvic exam, combined with testing for human papilloma virus (HPV), every 5 years. Some types of HPV increase your risk of cervical cancer. Testing for HPV may also be done on women of any age with unclear Pap test results.  Other health care providers may not recommend any screening for nonpregnant women who are considered low risk for pelvic cancer and who do not have symptoms. Ask your health care provider if a screening pelvic exam is right for you.  If you have had past treatment for cervical cancer or a condition that could lead to cancer, you need Pap tests and screening for cancer for at least 20 years after your treatment. If Pap tests have been discontinued, your risk factors (such as having a new sexual partner) need to be reassessed to determine if screening should resume. Some women have medical problems that increase the chance of getting cervical cancer. In these cases, your health care provider may recommend more frequent screening and Pap tests. Colorectal Cancer  This type of cancer can be detected and often prevented.  Routine colorectal cancer screening usually begins at 39 years of age and continues through 40 years of age.  Your health care provider may recommend screening at an earlier age if you have risk factors for colon cancer.  Your health care provider may also recommend using home test kits to check for hidden blood in the stool.  A small camera at the end of a tube can be used to examine your colon directly (sigmoidoscopy or colonoscopy). This is done to check for the earliest forms  of colorectal cancer.  Routine screening usually begins at age 70.  Direct examination of the colon should be repeated every 5-10 years through 40 years of age. However, you may need to be screened more often if early forms of precancerous polyps or small growths are found. Skin Cancer  Check your skin from head to toe regularly.  Tell your health care provider about any new moles or changes in moles, especially if there is a change in a mole's shape or color.  Also tell your health care provider if you have a mole that is larger than the size of a pencil eraser.  Always use sunscreen. Apply sunscreen liberally and repeatedly throughout the day.  Protect yourself by wearing long sleeves, pants, a wide-brimmed hat, and sunglasses whenever you are outside. Heart disease, diabetes, and high blood pressure  High blood pressure causes heart disease and increases the risk of stroke. High blood pressure is more likely to develop in:  People who have blood pressure in the high end of the normal  range (130-139/85-89 mm Hg).  People who are overweight or obese.  People who are African American.  If you are 80-21 years of age, have your blood pressure checked every 3-5 years. If you are 68 years of age or older, have your blood pressure checked every year. You should have your blood pressure measured twice-once when you are at a hospital or clinic, and once when you are not at a hospital or clinic. Record the average of the two measurements. To check your blood pressure when you are not at a hospital or clinic, you can use:  An automated blood pressure machine at a pharmacy.  A home blood pressure monitor.  If you are between 73 years and 60 years old, ask your health care provider if you should take aspirin to prevent strokes.  Have regular diabetes screenings. This involves taking a blood sample to check your fasting blood sugar level.  If you are at a normal weight and have a low risk for  diabetes, have this test once every three years after 40 years of age.  If you are overweight and have a high risk for diabetes, consider being tested at a younger age or more often. Preventing infection Hepatitis B  If you have a higher risk for hepatitis B, you should be screened for this virus. You are considered at high risk for hepatitis B if:  You were born in a country where hepatitis B is common. Ask your health care provider which countries are considered high risk.  Your parents were born in a high-risk country, and you have not been immunized against hepatitis B (hepatitis B vaccine).  You have HIV or AIDS.  You use needles to inject street drugs.  You live with someone who has hepatitis B.  You have had sex with someone who has hepatitis B.  You get hemodialysis treatment.  You take certain medicines for conditions, including cancer, organ transplantation, and autoimmune conditions. Hepatitis C  Blood testing is recommended for:  Everyone born from [birth year] through 1965.  Anyone with known risk factors for hepatitis C. Sexually transmitted infections (STIs)  You should be screened for sexually transmitted infections (STIs) including gonorrhea and chlamydia if:  You are sexually active and are younger than 40 years of age.  You are older than 40 years of age and your health care provider tells you that you are at risk for this type of infection.  Your sexual activity has changed since you were last screened and you are at an increased risk for chlamydia or gonorrhea. Ask your health care provider if you are at risk.  If you do not have HIV, but are at risk, it may be recommended that you take a prescription medicine daily to prevent HIV infection. This is called pre-exposure prophylaxis (PrEP). You are considered at risk if:  You are sexually active and do not regularly use condoms or know the HIV status of your partner(s).  You take drugs by injection.  You are  sexually active with a partner who has HIV. Talk with your health care provider about whether you are at high risk of being infected with HIV. If you choose to begin PrEP, you should first be tested for HIV. You should then be tested every 3 months for as long as you are taking PrEP. Pregnancy  If you are premenopausal and you may become pregnant, ask your health care provider about preconception counseling.  If you may become pregnant, take 400 to 800  micrograms (mcg) of folic acid every day.  If you want to prevent pregnancy, talk to your health care provider about birth control (contraception). Osteoporosis and menopause  Osteoporosis is a disease in which the bones lose minerals and strength with aging. This can result in serious bone fractures. Your risk for osteoporosis can be identified using a bone density scan.  If you are 61 years of age or older, or if you are at risk for osteoporosis and fractures, ask your health care provider if you should be screened.  Ask your health care provider whether you should take a calcium or vitamin D supplement to lower your risk for osteoporosis.  Menopause may have certain physical symptoms and risks.  Hormone replacement therapy may reduce some of these symptoms and risks. Talk to your health care provider about whether hormone replacement therapy is right for you. Follow these instructions at home:  Schedule regular health, dental, and eye exams.  Stay current with your immunizations.  Do not use any tobacco products including cigarettes, chewing tobacco, or electronic cigarettes.  If you are pregnant, do not drink alcohol.  If you are breastfeeding, limit how much and how often you drink alcohol.  Limit alcohol intake to no more than 1 drink per day for nonpregnant women. One drink equals 12 ounces of beer, 5 ounces of wine, or 1 ounces of hard liquor.  Do not use street drugs.  Do not share needles.  Ask your health care  provider for help if you need support or information about quitting drugs.  Tell your health care provider if you often feel depressed.  Tell your health care provider if you have ever been abused or do not feel safe at home. This information is not intended to replace advice given to you by your health care provider. Make sure you discuss any questions you have with your health care provider. Document Released: 04/07/2011 Document Revised: 02/28/2016 Document Reviewed: 06/26/2015  2017 [employer]

## 2016-09-09 ENCOUNTER — Telehealth: Payer: Self-pay

## 2016-09-09 ENCOUNTER — Ambulatory Visit
Admission: RE | Admit: 2016-09-09 | Discharge: 2016-09-09 | Disposition: A | Discharge status: Home / Self Care | Payer: Managed Care, Other (non HMO) | Source: Ambulatory Visit | Attending: Gastroenterology | Admitting: Gastroenterology

## 2016-09-09 DIAGNOSIS — R110 Nausea: Secondary | ICD-10-CM | POA: Insufficient documentation

## 2016-09-09 LAB — CBC WITH DIFFERENTIAL/PLATELET
BASOS: 1 %
Basophils Absolute: 0.1 10*3/uL (ref 0.0–0.2)
EOS (ABSOLUTE): 0.2 10*3/uL (ref 0.0–0.4)
Eos: 2 %
Hematocrit: 38 % (ref 34.0–46.6)
Hemoglobin: 12.2 g/dL (ref 11.1–15.9)
IMMATURE GRANS (ABS): 0 10*3/uL (ref 0.0–0.1)
Immature Granulocytes: 0 %
LYMPHS ABS: 3.3 10*3/uL — AB (ref 0.7–3.1)
Lymphs: 30 %
MCH: 29.1 pg (ref 26.6–33.0)
MCHC: 32.1 g/dL (ref 31.5–35.7)
MCV: 91 fL (ref 79–97)
MONOS ABS: 0.8 10*3/uL (ref 0.1–0.9)
Monocytes: 8 %
NEUTROS ABS: 6.6 10*3/uL (ref 1.4–7.0)
Neutrophils: 59 %
PLATELETS: 271 10*3/uL (ref 150–379)
RBC: 4.19 x10E6/uL (ref 3.77–5.28)
RDW: 13.8 % (ref 12.3–15.4)
WBC: 10.9 10*3/uL — ABNORMAL HIGH (ref 3.4–10.8)

## 2016-09-09 LAB — COMPREHENSIVE METABOLIC PANEL
A/G RATIO: 1.6 (ref 1.2–2.2)
ALK PHOS: 74 IU/L (ref 39–117)
ALT: 14 IU/L (ref 0–32)
AST: 13 IU/L (ref 0–40)
Albumin: 4.2 g/dL (ref 3.5–5.5)
BUN / CREAT RATIO: 12 (ref 9–23)
BUN: 9 mg/dL (ref 6–24)
CHLORIDE: 101 mmol/L (ref 96–106)
CO2: 24 mmol/L (ref 18–29)
Calcium: 8.7 mg/dL (ref 8.7–10.2)
Creatinine, Ser: 0.76 mg/dL (ref 0.57–1.00)
GFR calc non Af Amer: 98 mL/min/{1.73_m2} (ref >59)
GFR, EST AFRICAN AMERICAN: 113 mL/min/{1.73_m2} (ref >59)
Globulin, Total: 2.6 g/dL (ref 1.5–4.5)
Glucose: 124 mg/dL — ABNORMAL HIGH (ref 65–99)
POTASSIUM: 4.4 mmol/L (ref 3.5–5.2)
Sodium: 138 mmol/L (ref 134–144)
TOTAL PROTEIN: 6.8 g/dL (ref 6.0–8.5)

## 2016-09-09 LAB — TSH: TSH: 1.72 u[IU]/mL (ref 0.450–4.500)

## 2016-09-09 LAB — LIPID PANEL W/O CHOL/HDL RATIO
CHOLESTEROL TOTAL: 160 mg/dL (ref 100–199)
HDL: 74 mg/dL (ref >39)
LDL Calculated: 57 mg/dL (ref 0–99)
Triglycerides: 147 mg/dL (ref 0–149)
VLDL Cholesterol Cal: 29 mg/dL (ref 5–40)

## 2016-09-09 IMAGING — NM NM GASTRIC EMPTYING
6 series · 20 of 20 positions shown · non-contrast
Comparison: None.

CLINICAL DATA: Nausea and vomiting for 9 months.

EXAM:
NUCLEAR MEDICINE GASTRIC EMPTYING SCAN
TECHNIQUE: After oral ingestion of radiolabeled meal, sequential abdominal
images were obtained for 4 hours. Percentage of activity emptying
the stomach was calculated at 1 hour, 2 hour, 3 hour, and 4 hours.
RADIOPHARMACEUTICALS:  2.24 mCi 9c-DDm sulfur colloid in
standardized meal

[Series 1000: gastric statics 2 hour · 3.90mm/px · 2 of 2 frames shown]
[frame 1/2]
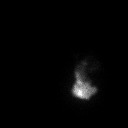
[frame 2/2]
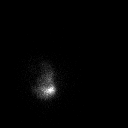

[Series 1000: gastric statics 1 hour · 3.90mm/px · 2 of 2 frames shown]
[frame 1/2]
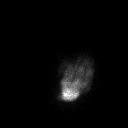
[frame 2/2]
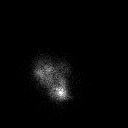

[Series 1000: gastric statics · 3.90mm/px · 2 of 2 frames shown]
[frame 1/2]
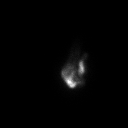
[frame 2/2]
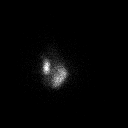

[Series 1000: gastic statics 4 hour · 3.90mm/px · 2 of 2 frames shown]
[frame 1/2]
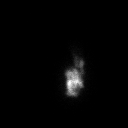
[frame 2/2]
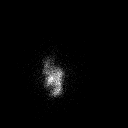

[Series 1000: gastic statics 4 hour (results) · 3.90mm/px · 5 acquisitions, 10 frames shown]
[im 1/5]
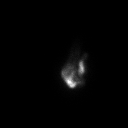
[im 1/5]
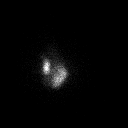
[im 2/5]
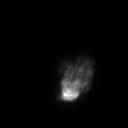
[im 2/5]
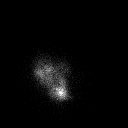
[im 3/5]
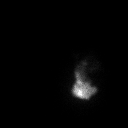
[im 3/5]
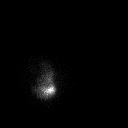
[im 4/5]
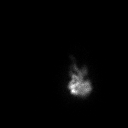
[im 4/5]
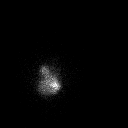
[im 5/5]
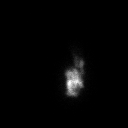
[im 5/5]
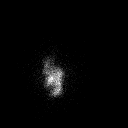

[Series 1000: gastric statics 3 hour · 3.90mm/px · 2 of 2 frames shown]
[frame 1/2]
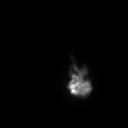
[frame 2/2]
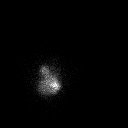

[20 of 20 positions shown; findings below may reference images not displayed]

FINDINGS: Expected location of the stomach in the left upper quadrant.
Ingested meal empties the stomach gradually over the course of the
study.

Ninety-nine percent emptied at 1 hr ( normal >= 10%)

One hundred percent emptied at 2 hr ( normal >= 40%)

One hundred percent emptied at 3 hr ( normal >= 70%)

One hundred percent emptied at 4 hr ( normal >= 90%)
IMPRESSION: Normal gastric emptying study.

## 2016-09-09 MED ORDER — TECHNETIUM TC 99M SULFUR COLLOID
2.0000 | Freq: Once | INTRAVENOUS | Status: AC | PRN
  Administered 2016-09-09: 2.24 via INTRAVENOUS

## 2016-09-09 NOTE — Telephone Encounter (Signed)
Pt notified of Gastric emptying study results. She stated the PPI is helping with the reflux but when she eats she feels like the food is stopping at the top of her stomach and not going down. This is when she vomits. Do you thinks she needs to revisit her bariatric surgeon? He bypass surgery was 3 years ago. Please advise.

## 2016-09-09 NOTE — Telephone Encounter (Signed)
-----   Message from Wyline MoodKiran Anna, MD sent at 09/09/2016  3:53 PM EST ----- normal

## 2016-09-10 ENCOUNTER — Other Ambulatory Visit: Payer: Self-pay

## 2016-09-10 DIAGNOSIS — K220 Achalasia of cardia: Secondary | ICD-10-CM

## 2016-09-10 LAB — IGP, APTIMA HPV, RFX 16/18,45
HPV APTIMA: NEGATIVE
PAP Smear Comment: 0

## 2016-09-10 NOTE — Telephone Encounter (Signed)
1. Barium swallow with tablet  2. Ifnegative will need manometry to see if she has achalasia

## 2016-09-10 NOTE — Telephone Encounter (Signed)
Pt scheduled for a barium swallow with tablet at [facility] on Wed, Dec 13th at 11:00am. Pt has been advised to arrive [facility] the medical mall at 10:45am and to be NPO 3 hours prior to scan. Pt verbalized understanding of these instructions.   Advised her if this is negative, we will move forward with the manometry.

## 2016-09-12 ENCOUNTER — Other Ambulatory Visit: Payer: Self-pay | Admitting: Family Medicine

## 2016-09-17 ENCOUNTER — Ambulatory Visit
Admission: RE | Admit: 2016-09-17 | Discharge: 2016-09-17 | Disposition: A | Discharge status: Home / Self Care | Payer: Private Health Insurance - Indemnity | Source: Ambulatory Visit | Attending: Gastroenterology | Admitting: Gastroenterology

## 2016-09-17 DIAGNOSIS — K449 Diaphragmatic hernia without obstruction or gangrene: Secondary | ICD-10-CM | POA: Diagnosis not present

## 2016-09-17 DIAGNOSIS — K220 Achalasia of cardia: Secondary | ICD-10-CM | POA: Diagnosis present

## 2016-09-17 IMAGING — RF DG ESOPHAGUS
12 of 16 series · 14 of 21 positions shown · non-contrast
Comparison: None in PACs

CLINICAL DATA: Nausea and vomiting following every meal associated
with cough. Patient underwent upper endoscopy 2 weeks ago. Patient
also port some difficulty with swallowing of tablets. Symptoms have
increased over the past 5 months. History of Roux-en Y gastric
bypass procedure in 7569.

EXAM:
ESOPHOGRAM / BARIUM SWALLOW / BARIUM TABLET STUDY
TECHNIQUE: Combined double contrast and single contrast examination performed
using effervescent crystals, thick barium liquid, and thin barium
liquid. The patient was observed with fluoroscopy swallowing a 13 mm
barium sulphate tablet.
FLUOROSCOPY TIME:  Fluoroscopy Time:  1 minutes, 6 seconds
Radiation Exposure Index (if provided by the fluoroscopic device):
0019 micro Gy per meters squared
Number of Acquired Spot Images: 14+ 2 cine loops.

[Series 1: fluoro_barium 2fps_bw · 0.17mm/px · 1 of 1 slices shown (1 of 12)]
[im 1/1]
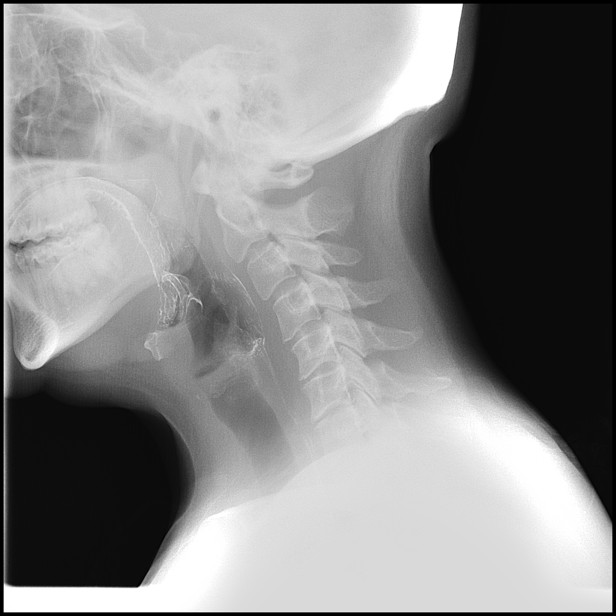

[Series 3: fluoro_barium 2fps_bw · 0.18mm/px · 1 of 1 slices shown (2 of 12)]
[im 1/1]
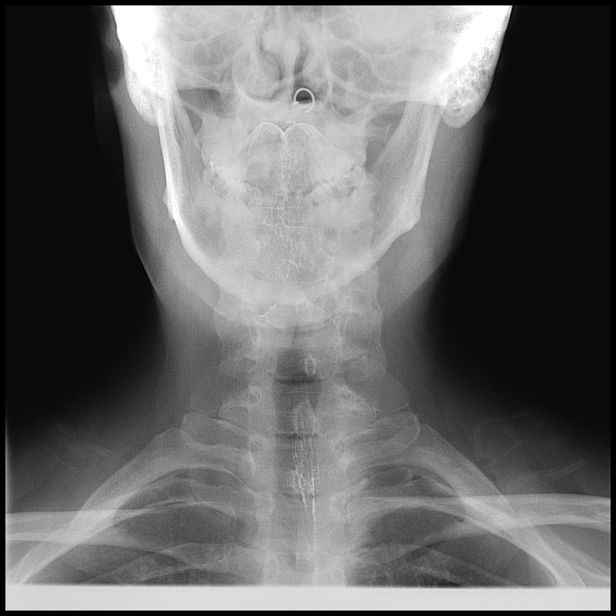

[Series 4: fluoro_barium 2fps_bw · 0.18mm/px · 1 of 1 slices shown (3 of 12)]
[im 1/1]
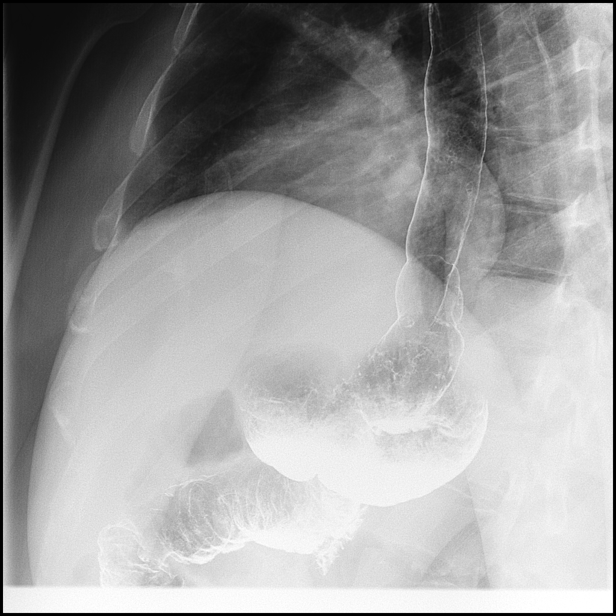

[Series 6: fluoro_barium 2fps_bw · 0.18mm/px · 1 of 1 slices shown (4 of 12)]
[im 1/1]
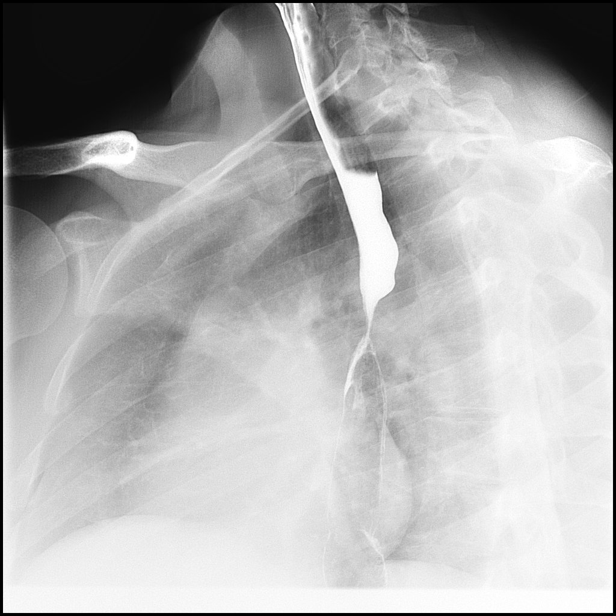

[Series 7: fluoro_barium 2fps_bw · 0.18mm/px · 1 of 1 slices shown (5 of 12)]
[im 1/1]
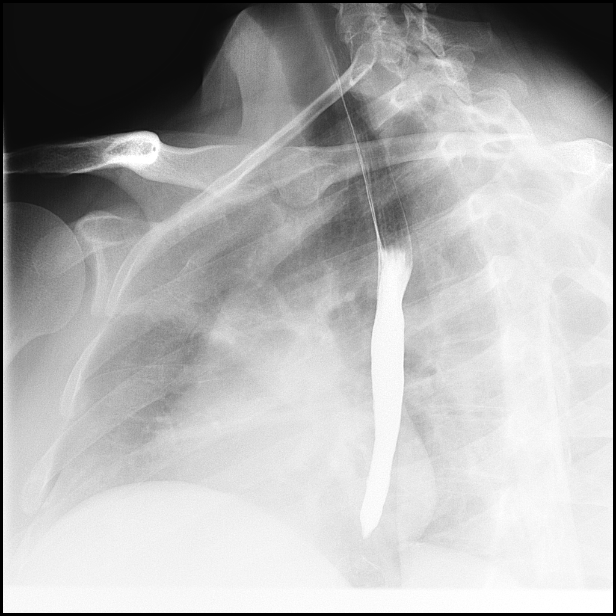

[Series 9: fluoro_barium 2fps_bw · 0.18mm/px · 1 of 1 slices shown (6 of 12)]
[im 1/1]
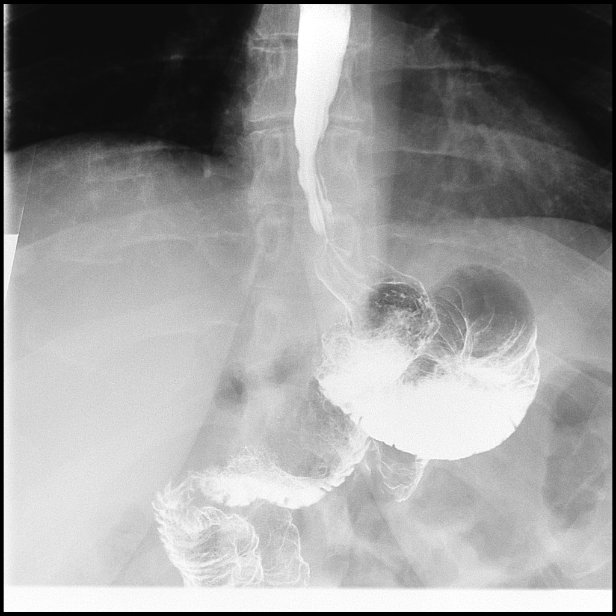

[Series 10: fluoro_barium 2fps_bw · 0.18mm/px · 1 of 1 slices shown (7 of 12)]
[im 1/1]
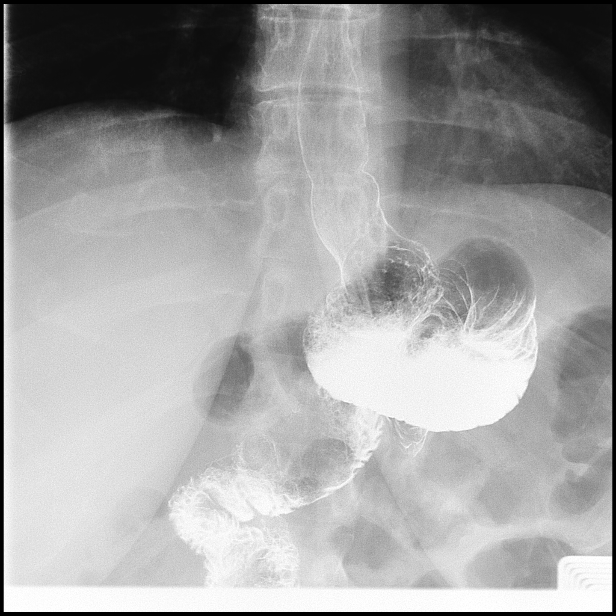

[Series 12: fluoro_barium 2fps_bw · 0.18mm/px · 1 of 1 slices shown (8 of 12)]
[im 1/1]
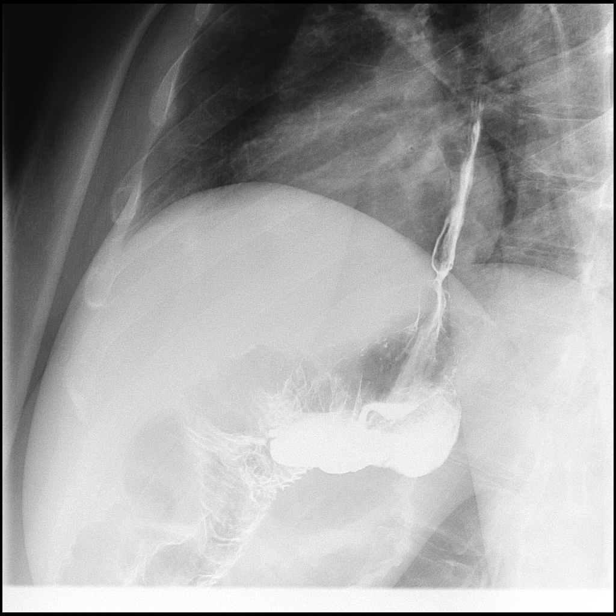

[Series 13: fluoro_barium 2fps_bw · 0.18mm/px · 1 of 1 slices shown (9 of 12)]
[im 1/1]
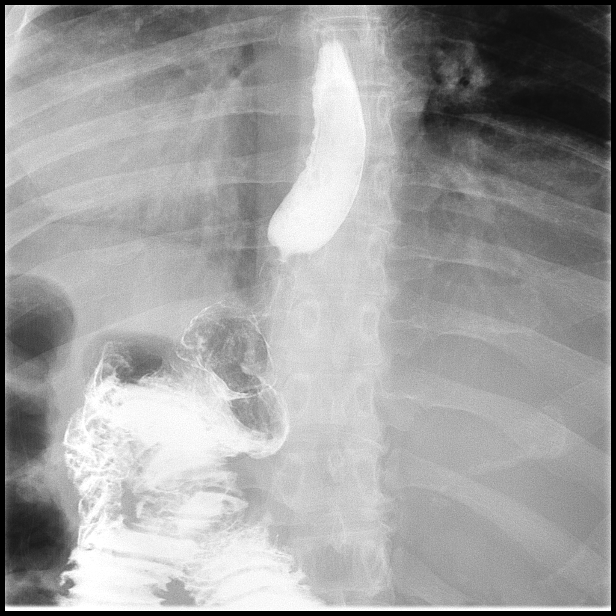

[Series 14: fluoro_barium 2fps_bw · 0.18mm/px · 2 of 12 frames shown (10 of 12)]
[frame 7/12]
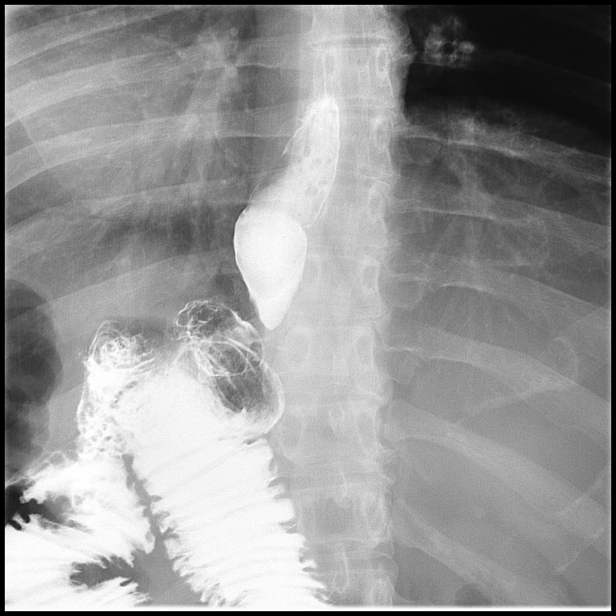
[frame 11/12]
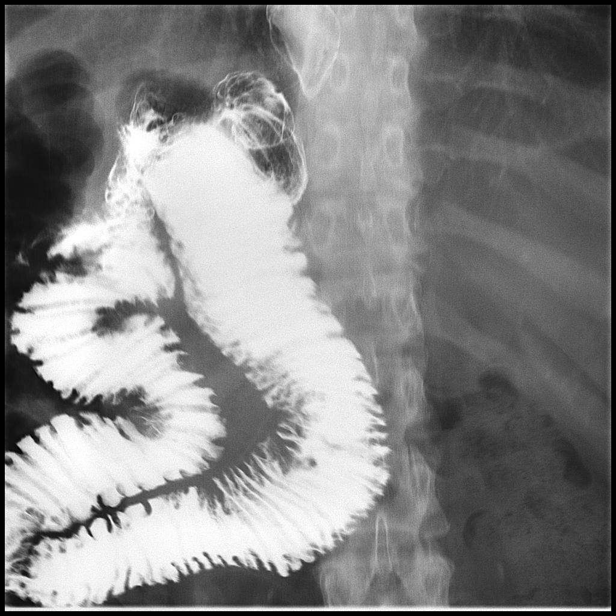

[Series 15: fluoro_barium 2fps_bw · 0.18mm/px · 2 of 17 frames shown (11 of 12)]
[frame 9/17]
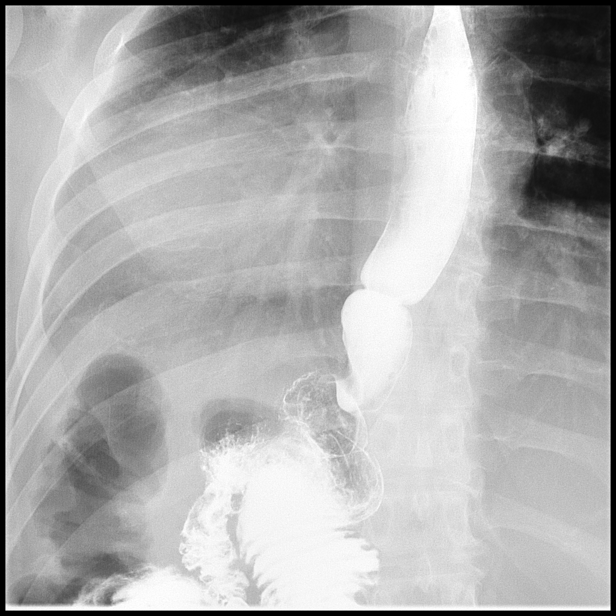
[frame 15/17]
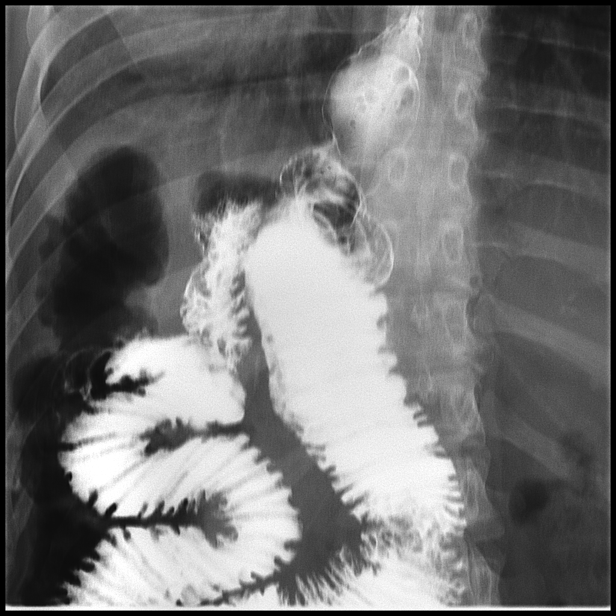

[Series 16: fluoro_barium 2fps_bw · 0.18mm/px · 1 of 2 frames shown (12 of 12)]
[frame 2/2]
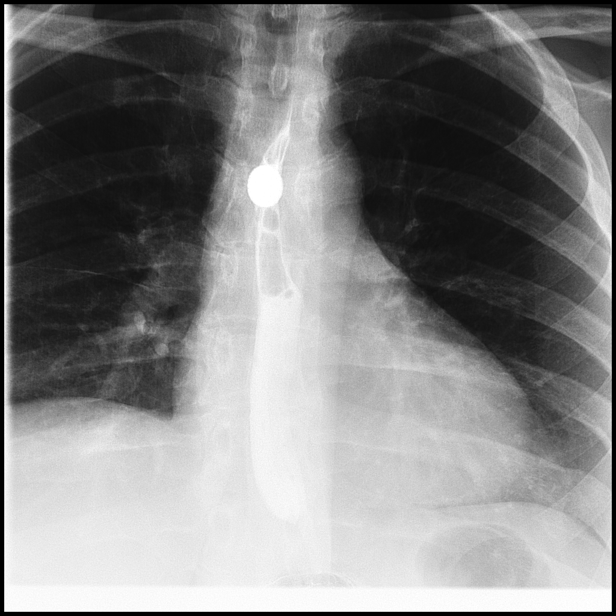

[14 of 21 positions shown; findings below may reference images not displayed]

FINDINGS: The patient ingested the thick and thin barium and gas forming
crystals without difficulty. The cervical esophagus distended well.
There was no laryngeal penetration of the barium. The thoracic
esophagus distended reasonably well. An area of subtle mild
narrowing was noted in the mid esophagus. There was a small
reducible hiatal hernia. No gastroesophageal reflux was observed.
The barium tablet hung at the level of the mid esophagus and then
passed into the gastric pouch following additional sips of water.

There was prompt emptying of the gastric pouch into the efferent
loop of the jejunum of the gastric bypass. There was mild distention
of the proximal jejunum with gas and barium which prompted the
patient's nausea. The patient reports that this sensation is similar
to that that she experiences when eating.
IMPRESSION: Normal appearance of the cervical esophagus. No laryngeal
penetration of the barium. Esophageal motility appears normal. There
is mild focal narrowing in the mid esophagus which is transiently
obstructive to passage of the barium tablet. If the patient's
difficulty with tablets worsens, upper endoscopy with consideration
for dilation would be useful.

There is a small reducible hiatal hernia.  No reflux was observed.

Prompt emptying of the gastric pouch into the inferior loop of the
jejunum is observed. No anastomotic ulceration or stricture is
observed. The patient experienced nausea when there was mild
distention of proximal portion of the efferent jejunal loop with gas
and barium.

## 2016-09-25 ENCOUNTER — Encounter: Payer: Self-pay | Admitting: Family Medicine

## 2016-10-10 ENCOUNTER — Other Ambulatory Visit: Payer: Private Health Insurance - Indemnity

## 2016-10-10 ENCOUNTER — Ambulatory Visit: Payer: Private Health Insurance - Indemnity

## 2016-10-15 ENCOUNTER — Encounter: Payer: Self-pay | Admitting: Family Medicine

## 2016-10-15 NOTE — Telephone Encounter (Signed)
Routing to provider  

## 2016-10-16 ENCOUNTER — Telehealth: Payer: Self-pay

## 2016-10-16 NOTE — Telephone Encounter (Signed)
LVM for pt to return my call.

## 2016-10-16 NOTE — Telephone Encounter (Signed)
-----   Message from Wyline MoodKiran Anna, MD sent at 10/08/2016  8:55 AM EST ----- Enquire with pt how her swallowing is doing , barium showed mild narrowing in the middle of her esophagus , if has issues with swallowing will benefit from dilation

## 2016-10-17 ENCOUNTER — Other Ambulatory Visit: Payer: Private Health Insurance - Indemnity

## 2016-10-17 ENCOUNTER — Ambulatory Visit: Payer: Private Health Insurance - Indemnity | Attending: Family Medicine

## 2016-10-20 ENCOUNTER — Other Ambulatory Visit: Payer: Self-pay

## 2016-10-20 ENCOUNTER — Encounter: Payer: Self-pay | Admitting: Family Medicine

## 2016-10-20 NOTE — Telephone Encounter (Signed)
No [facility] is [facility] for [facility] [phone].

## 2016-10-20 NOTE — Telephone Encounter (Signed)
[facility]GD at [facility] with Dr. Tobi BastosAnna on 11/07/16. Please precert for Dysphagia R13.10.

## 2016-10-20 NOTE — Telephone Encounter (Signed)
Pt notified of barium swallow results. Pt has been scheduled for EGD on 11/07/16.

## 2016-10-21 ENCOUNTER — Encounter: Payer: Self-pay | Admitting: Family Medicine

## 2016-10-21 ENCOUNTER — Ambulatory Visit (INDEPENDENT_AMBULATORY_CARE_PROVIDER_SITE_OTHER): Payer: Self-pay | Admitting: Family Medicine

## 2016-10-21 VITALS — BP 116/80 | HR 76 | Temp 97.8°F | Wt 212.2 lb

## 2016-10-21 DIAGNOSIS — J0100 Acute maxillary sinusitis, unspecified: Secondary | ICD-10-CM

## 2016-10-21 MED ORDER — HYDROCOD POLST-CPM POLST ER 10-8 MG/5ML PO SUER: 5.0000 mL | Freq: Every evening | ORAL | 0 refills | Status: DC | PRN

## 2016-10-21 MED ORDER — AMOXICILLIN-POT CLAVULANATE 875-125 MG PO TABS: 1.0000 | ORAL_TABLET | Freq: Two times a day (BID) | ORAL | 0 refills | Status: DC

## 2016-10-21 NOTE — Progress Notes (Signed)
BP 116/80 (BP Location: Left Arm, Patient Position: Sitting, Cuff Size: Large)   Pulse 76   Temp 97.8 F (36.6 C)   Wt 212 lb 3.2 oz (96.3 kg)   SpO2 99%   BMI 37.00 kg/m    Subjective:    Patient ID: Belinda [name], female    DOB: [birth year], 41 y.o.   MRN: [id number]  HPI: Belinda [name] is a 41 y.o. female  Chief Complaint  Patient presents with  . Influenza    Patient was diagnosed with the flu 9 days ago, she states that she still has a cough and congestion.    UPPER RESPIRATORY TRACT INFECTION Duration: 9 days Worst symptom: cough Fever: no Cough: yes Shortness of breath: yes Wheezing: no Chest pain: yes, with cough Chest tightness: yes Chest congestion: no Nasal congestion: yes Runny nose: yes Post nasal drip: yes Sneezing: no Sore throat: yes Swollen glands: yes Sinus pressure: yes Headache: yes Face pain: yes Toothache: no Ear pain: yes left Ear pressure: yes left Eyes red/itching:no Eye drainage/crusting: no  Vomiting: yes Rash: no Fatigue: yes Sick contacts: yes Strep contacts: no  Context: better Recurrent sinusitis: no Relief with OTC cold/cough medications: no  Treatments attempted: cold/sinus    Relevant past medical, surgical, family and social history reviewed and updated as indicated. Interim medical history since our last visit reviewed. Allergies and medications reviewed and updated.  Review of Systems  Constitutional: Positive for fatigue. Negative for activity change, appetite change, chills, diaphoresis, fever and unexpected weight change.  HENT: Positive for congestion, postnasal drip, rhinorrhea, sinus pain and sinus pressure. Negative for dental problem, drooling, ear discharge, ear pain, facial swelling, hearing loss, mouth sores, nosebleeds, sneezing, sore throat, tinnitus, trouble swallowing and voice change.   Respiratory: Positive for cough and shortness of breath. Negative for apnea, choking, chest tightness, wheezing  and stridor.   Cardiovascular: Positive for chest pain. Negative for palpitations and leg swelling.  Psychiatric/Behavioral: Negative.     Per HPI unless specifically indicated above     Objective:    BP 116/80 (BP Location: Left Arm, Patient Position: Sitting, Cuff Size: Large)   Pulse 76   Temp 97.8 F (36.6 C)   Wt 212 lb 3.2 oz (96.3 kg)   SpO2 99%   BMI 37.00 kg/m   Wt Readings from Last 3 Encounters:  10/21/16 212 lb 3.2 oz (96.3 kg)  09/08/16 215 lb (97.5 kg)  08/06/16 213 lb (96.6 kg)    Physical Exam  Constitutional: She is oriented to person, place, and time. She appears well-developed and well-nourished. No distress.  HENT:  Head: Normocephalic and atraumatic.  Right Ear: Hearing, tympanic membrane, external ear and ear canal normal.  Left Ear: Hearing, tympanic membrane, external ear and ear canal normal.  Nose: Mucosal edema and rhinorrhea present. Right sinus exhibits no maxillary sinus tenderness and no frontal sinus tenderness. Left sinus exhibits maxillary sinus tenderness. Left sinus exhibits no frontal sinus tenderness.  Mouth/Throat: Uvula is midline, oropharynx is clear and moist and mucous membranes are normal. No oropharyngeal exudate.  Eyes: Conjunctivae, EOM and lids are normal. Pupils are equal, round, and reactive to light. Right eye exhibits no discharge. Left eye exhibits no discharge. No scleral icterus.  Neck: Normal range of motion. Neck supple. No JVD present. No tracheal deviation present. No thyromegaly present.  Cardiovascular: Normal rate, regular rhythm, normal heart sounds and intact distal pulses.  Exam reveals no gallop and no friction rub.   No  murmur heard. Pulmonary/Chest: Effort normal and breath sounds normal. No stridor. No respiratory distress. She has no wheezes. She has no rales. She exhibits no tenderness.  Musculoskeletal: Normal range of motion.  Lymphadenopathy:    She has no cervical adenopathy.  Neurological: She is alert  and oriented to person, place, and time.  Skin: Skin is intact. No rash noted. She is not diaphoretic.  Psychiatric: She has a normal mood and affect. Her speech is normal and behavior is normal. Judgment and thought content normal. Cognition and memory are normal.  Nursing note and vitals reviewed.   Results for orders placed or performed in visit on 09/08/16  Microscopic Examination  Result Value Ref Range   WBC, UA None seen 0 - 5 /hpf   RBC, UA 3-10 (A) 0 - 2 /hpf   Epithelial Cells (non renal) 0-10 0 - 10 /hpf   Bacteria, UA Few (A) None seen/Few  WET PREP FOR TRICH, YEAST, CLUE  Result Value Ref Range   Trichomonas Exam Negative Negative   Yeast Exam Negative Negative   Clue Cell Exam Negative Negative  CBC with Differential/Platelet  Result Value Ref Range   WBC 10.9 (H) 3.4 - 10.8 x10E3/uL   RBC 4.19 3.77 - 5.28 x10E6/uL   Hemoglobin 12.2 11.1 - 15.9 g/dL   Hematocrit 16.1 09.6 - 46.6 %   MCV 91 79 - 97 fL   MCH 29.1 26.6 - 33.0 pg   MCHC 32.1 31.5 - 35.7 g/dL   RDW 04.5 40.9 - 81.1 %   Platelets 271 150 - 379 x10E3/uL   Neutrophils 59 Not Estab. %   Lymphs 30 Not Estab. %   Monocytes 8 Not Estab. %   Eos 2 Not Estab. %   Basos 1 Not Estab. %   Neutrophils Absolute 6.6 1.4 - 7.0 x10E3/uL   Lymphocytes Absolute 3.3 (H) 0.7 - 3.1 x10E3/uL   Monocytes Absolute 0.8 0.1 - 0.9 x10E3/uL   EOS (ABSOLUTE) 0.2 0.0 - 0.4 x10E3/uL   Basophils Absolute 0.1 0.0 - 0.2 x10E3/uL   Immature Granulocytes 0 Not Estab. %   Immature Grans (Abs) 0.0 0.0 - 0.1 x10E3/uL  Comprehensive metabolic panel  Result Value Ref Range   Glucose 124 (H) 65 - 99 mg/dL   BUN 9 6 - 24 mg/dL   Creatinine, Ser 9.14 0.57 - 1.00 mg/dL   GFR calc non Af Amer 98 >59 mL/min/1.73   GFR calc Af Amer 113 >59 mL/min/1.73   BUN/Creatinine Ratio 12 9 - 23   Sodium 138 134 - 144 mmol/L   Potassium 4.4 3.5 - 5.2 mmol/L   Chloride 101 96 - 106 mmol/L   CO2 24 18 - 29 mmol/L   Calcium 8.7 8.7 - 10.2 mg/dL    Total Protein 6.8 6.0 - 8.5 g/dL   Albumin 4.2 3.5 - 5.5 g/dL   Globulin, Total 2.6 1.5 - 4.5 g/dL   Albumin/Globulin Ratio 1.6 1.2 - 2.2   Bilirubin Total <0.2 0.0 - 1.2 mg/dL   Alkaline Phosphatase 74 39 - 117 IU/L   AST 13 0 - 40 IU/L   ALT 14 0 - 32 IU/L  Lipid Panel w/o Chol/HDL Ratio  Result Value Ref Range   Cholesterol, Total 160 100 - 199 mg/dL   Triglycerides 782 0 - 149 mg/dL   HDL 74 >95 mg/dL   VLDL Cholesterol Cal 29 5 - 40 mg/dL   LDL Calculated 57 0 - 99 mg/dL  TSH  Result Value  Ref Range   TSH 1.720 0.450 - 4.500 uIU/mL  Pregnancy, urine  Result Value Ref Range   Preg Test, Ur Negative Negative  Hemoglobin A1c  Result Value Ref Range   Hemoglobin A1C 5.8   UA/M w/rflx Culture, Routine  Result Value Ref Range   Specific Gravity, UA 1.005 1.005 - 1.030   pH, UA 6.0 5.0 - 7.5   Color, UA Yellow Yellow   Appearance Ur Clear Clear   Leukocytes, UA Negative Negative   Protein, UA Negative Negative/Trace   Glucose, UA Negative Negative   Ketones, UA Negative Negative   RBC, UA Trace (A) Negative   Bilirubin, UA Negative Negative   Urobilinogen, Ur 0.2 0.2 - 1.0 mg/dL   Nitrite, UA Negative Negative   Microscopic Examination See below:   Microalbumin, Urine Waived  Result Value Ref Range   Microalb, Ur Waived 10 0 - 19 mg/L   Creatinine, Urine Waived 50 10 - 300 mg/dL   Microalb/Creat Ratio <30 <30 mg/g  Bayer DCA Hb A1c Waived  Result Value Ref Range   Bayer DCA Hb A1c Waived 5.8 <7.0 %  IGP, Aptima HPV, rfx 16/18,45  Result Value Ref Range   DIAGNOSIS: Comment    Specimen adequacy: Comment    CLINICIAN PROVIDED ICD10: Comment    Performed by: Comment    PAP SMEAR COMMENT .    Note: Comment    Test Methodology Comment    HPV Aptima Negative Negative      Assessment & Plan:   Problem List Items Addressed This Visit    None    Visit Diagnoses    Acute non-recurrent maxillary sinusitis    -  Primary   Will treat with augmentin and tussionex for  comfort. Call with any concerns or if not getting better or getting worse.    Relevant Medications   amoxicillin-clavulanate (AUGMENTIN) 875-125 MG tablet   chlorpheniramine-HYDROcodone (TUSSIONEX PENNKINETIC ER) 10-8 MG/5ML SUER       Follow up plan: Return if symptoms worsen or fail to improve.

## 2016-10-22 ENCOUNTER — Encounter: Payer: Self-pay | Admitting: Family Medicine

## 2016-10-22 ENCOUNTER — Ambulatory Visit: Payer: Managed Care, Other (non HMO) | Admitting: Family Medicine

## 2016-10-24 ENCOUNTER — Other Ambulatory Visit: Payer: Self-pay | Admitting: Family Medicine

## 2016-10-24 MED ORDER — HYDROCODONE-IBUPROFEN 5-200 MG PO TABS: 1.0000 | ORAL_TABLET | Freq: Three times a day (TID) | ORAL | 0 refills | Status: DC | PRN

## 2016-11-07 ENCOUNTER — Encounter
Admission: RE | Disposition: A | Discharge status: Home / Self Care | Payer: Self-pay | Source: Ambulatory Visit | Attending: Gastroenterology

## 2016-11-07 ENCOUNTER — Ambulatory Visit: Payer: Commercial Managed Care - PPO | Admitting: Anesthesiology

## 2016-11-07 ENCOUNTER — Ambulatory Visit
Admission: RE | Admit: 2016-11-07 | Discharge: 2016-11-07 | Disposition: A | Discharge status: Home / Self Care | Payer: Commercial Managed Care - PPO | Source: Ambulatory Visit | Attending: Gastroenterology | Admitting: Gastroenterology

## 2016-11-07 DIAGNOSIS — Z7982 Long term (current) use of aspirin: Secondary | ICD-10-CM | POA: Diagnosis not present

## 2016-11-07 DIAGNOSIS — E119 Type 2 diabetes mellitus without complications: Secondary | ICD-10-CM | POA: Diagnosis not present

## 2016-11-07 DIAGNOSIS — K222 Esophageal obstruction: Secondary | ICD-10-CM

## 2016-11-07 DIAGNOSIS — F209 Schizophrenia, unspecified: Secondary | ICD-10-CM | POA: Insufficient documentation

## 2016-11-07 DIAGNOSIS — K219 Gastro-esophageal reflux disease without esophagitis: Secondary | ICD-10-CM | POA: Diagnosis not present

## 2016-11-07 DIAGNOSIS — Z8711 Personal history of peptic ulcer disease: Secondary | ICD-10-CM | POA: Insufficient documentation

## 2016-11-07 DIAGNOSIS — M1990 Unspecified osteoarthritis, unspecified site: Secondary | ICD-10-CM | POA: Insufficient documentation

## 2016-11-07 DIAGNOSIS — Z9884 Bariatric surgery status: Secondary | ICD-10-CM

## 2016-11-07 DIAGNOSIS — F319 Bipolar disorder, unspecified: Secondary | ICD-10-CM | POA: Diagnosis not present

## 2016-11-07 DIAGNOSIS — Z79899 Other long term (current) drug therapy: Secondary | ICD-10-CM | POA: Diagnosis not present

## 2016-11-07 DIAGNOSIS — R1310 Dysphagia, unspecified: Secondary | ICD-10-CM | POA: Diagnosis not present

## 2016-11-07 DIAGNOSIS — R7303 Prediabetes: Secondary | ICD-10-CM | POA: Insufficient documentation

## 2016-11-07 DIAGNOSIS — G4730 Sleep apnea, unspecified: Secondary | ICD-10-CM | POA: Insufficient documentation

## 2016-11-07 HISTORY — DX: Schizophrenia, unspecified: F20.9

## 2016-11-07 HISTORY — DX: Prediabetes: R73.03

## 2016-11-07 HISTORY — PX: ESOPHAGOGASTRODUODENOSCOPY (EGD) WITH PROPOFOL: SHX5813

## 2016-11-07 LAB — POCT PREGNANCY, URINE: PREG TEST UR: NEGATIVE

## 2016-11-07 SURGERY — ESOPHAGOGASTRODUODENOSCOPY (EGD) WITH PROPOFOL
Anesthesia: General

## 2016-11-07 MED ORDER — FENTANYL CITRATE (PF) 100 MCG/2ML IJ SOLN
INTRAMUSCULAR | Status: DC | PRN
  Administered 2016-11-07 (×2): 50 ug via INTRAVENOUS

## 2016-11-07 MED ORDER — ONDANSETRON HCL 4 MG/2ML IJ SOLN
INTRAMUSCULAR | Status: DC | PRN
  Administered 2016-11-07: 4 mg via INTRAVENOUS

## 2016-11-07 MED ORDER — GLYCOPYRROLATE 0.2 MG/ML IJ SOLN
INTRAMUSCULAR | Status: AC
  Filled 2016-11-07: qty 1

## 2016-11-07 MED ORDER — ONDANSETRON HCL 4 MG/2ML IJ SOLN
INTRAMUSCULAR | Status: AC
  Filled 2016-11-07: qty 2

## 2016-11-07 MED ORDER — PROPOFOL 500 MG/50ML IV EMUL
INTRAVENOUS | Status: AC
  Filled 2016-11-07: qty 50

## 2016-11-07 MED ORDER — GLYCOPYRROLATE 0.2 MG/ML IJ SOLN
INTRAMUSCULAR | Status: DC | PRN
  Administered 2016-11-07: 0.1 mg via INTRAVENOUS

## 2016-11-07 MED ORDER — SODIUM CHLORIDE 0.9 % IV SOLN
INTRAVENOUS | Status: DC
  Administered 2016-11-07: 08:00:00 via INTRAVENOUS

## 2016-11-07 MED ORDER — PROPOFOL 500 MG/50ML IV EMUL
INTRAVENOUS | Status: DC | PRN
  Administered 2016-11-07: 120 ug/kg/min via INTRAVENOUS

## 2016-11-07 MED ORDER — DEXAMETHASONE SODIUM PHOSPHATE 4 MG/ML IJ SOLN
INTRAMUSCULAR | Status: DC | PRN
  Administered 2016-11-07: 4 mg via INTRAVENOUS

## 2016-11-07 MED ORDER — MIDAZOLAM HCL 2 MG/2ML IJ SOLN
INTRAMUSCULAR | Status: DC | PRN
Start: 2016-11-07 — End: 2016-11-07
  Administered 2016-11-07 (×2): 1 mg via INTRAVENOUS

## 2016-11-07 MED ORDER — LIDOCAINE HCL (CARDIAC) 20 MG/ML IV SOLN
INTRAVENOUS | Status: DC | PRN
  Administered 2016-11-07: 30 mg via INTRAVENOUS

## 2016-11-07 MED ORDER — LIDOCAINE HCL (PF) 2 % IJ SOLN
INTRAMUSCULAR | Status: AC
  Filled 2016-11-07: qty 2

## 2016-11-07 MED ORDER — MIDAZOLAM HCL 2 MG/2ML IJ SOLN
INTRAMUSCULAR | Status: AC
  Filled 2016-11-07: qty 2

## 2016-11-07 MED ORDER — DEXAMETHASONE SODIUM PHOSPHATE 10 MG/ML IJ SOLN
INTRAMUSCULAR | Status: AC
  Filled 2016-11-07: qty 1

## 2016-11-07 MED ORDER — FENTANYL CITRATE (PF) 100 MCG/2ML IJ SOLN
INTRAMUSCULAR | Status: AC
  Filled 2016-11-07: qty 2

## 2016-11-07 NOTE — H&P (Signed)
Jonathon Bellows MD [address] [place], [place] [address] Phone: [phone] Fax : [phone]  Primary Care Physician:  Park Liter, DO Primary Gastroenterologist:  Dr. Jonathon Bellows   Pre-Procedure History & Physical: HPI:  SHAINA GULLATT is a 41 y.o. female is here for an endoscopy and dilation of esophageal stricture seen on barium study. Patient still has on and off difficulty swallowing pills .   Past Medical History:  Diagnosis Date  . Arthritis    lower back  . Bipolar affective disorder ([place])   . Deviated septum   . GERD (gastroesophageal reflux disease)   . Headache(784.0)    migraines - none 2 months  . Peptic ulcer   . Pre-diabetes   . Schizophrenia ([place])   . Sleep apnea   . Substance abuse     Past Surgical History:  Procedure Laterality Date  . ESOPHAGOGASTRODUODENOSCOPY  08/05/2012   Procedure: ESOPHAGOGASTRODUODENOSCOPY (EGD);  Surgeon: Shann Medal, MD;  Location: [facility];  Service: General;  Laterality: N/A;  . ESOPHAGOGASTRODUODENOSCOPY (EGD) WITH PROPOFOL N/A 08/06/2016   Procedure: ESOPHAGOGASTRODUODENOSCOPY (EGD) WITH PROPOFOL;  Surgeon: Jonathon Bellows, MD;  Location: [facility];  Service: Endoscopy;  Laterality: N/A;  . 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 SEPTOPLASTY  2003    Prior to Admission medications   Medication Sig Start Date End Date Taking? Authorizing Provider  aspirin 81 MG tablet Take 81 mg by mouth daily.   Yes Historical Provider, MD  B-D 3CC LUER-LOK SYR 25GX1" 25G X 1" 3 ML MISC  02/27/16  Yes Historical Provider, MD  blood glucose meter kit and supplies KIT Dispense based on patient and insurance preference. Use up to four times daily as directed. (FOR ICD-9 250.00, 250.01). 06/26/15  Yes Megan P Johnson, DO  buPROPion (WELLBUTRIN XL) 150 MG 24 hr tablet Take 450 mg  by mouth daily.    Yes Historical Provider, MD  clonazePAM (KLONOPIN) 0.5 MG tablet  09/07/16  Yes Historical Provider, MD  [employer] GLUCOSE 4-6 GM-MG CHEW chewable tablet CHEW 3 TABLETS (15 G TOTAL) BY MOUTH AS NEEDED FOR LOW BLOOD SUGAR. 09/08/16  Yes Historical Provider, MD  lamoTRIgine (LAMICTAL) 150 MG tablet 300 mg daily.  07/31/16  Yes Historical Provider, MD  norgestimate-ethinyl estradiol (ORTHO-CYCLEN,SPRINTEC,PREVIFEM) 0.25-35 MG-MCG tablet Take 1 tablet by mouth daily. 02/29/16  Yes Megan P Johnson, DO  omeprazole (PRILOSEC) 20 MG capsule  08/31/16  Yes Historical Provider, MD  ondansetron (ZOFRAN) 4 MG tablet TAKE ONE TABLET BY MOUTH EVERY 8 HOURS AS NEEDED FOR NAUSEA 09/05/16  Yes Megan P Johnson, DO  sertraline (ZOLOFT) 100 MG tablet Take 200 mg by mouth daily.   Yes Historical Provider, MD  amoxicillin-clavulanate (AUGMENTIN) 875-125 MG tablet Take 1 tablet by mouth 2 (two) times daily. Patient not taking: Reported on 11/07/2016 10/21/16   Megan P Johnson, DO  ARIPiprazole (ABILIFY) 30 MG tablet Take 30 mg by mouth at bedtime.  03/06/16   Historical Provider, MD  benztropine (COGENTIN) 0.5 MG tablet  08/01/16   Historical Provider, MD  chlorpheniramine-HYDROcodone (TUSSIONEX PENNKINETIC ER) 10-8 MG/5ML SUER Take 5 mLs by mouth at bedtime as needed. Patient not taking: Reported on 11/07/2016 10/21/16   Megan P Johnson, DO  cyanocobalamin (,VITAMIN B-12,) 1000 MCG/ML injection INJECT 1 ML INTRAMUSCULARLY EVERY 2 WEEKS 09/12/16   [place], DO  glucose blood test strip Use as instructed 09/08/16   Megan P Johnson, DO  hydrocodone-ibuprofen (VICOPROFEN) 5-200 MG tablet Take 1 tablet by mouth every 8 (eight) hours as needed for pain. 10/24/16   Megan P Johnson, DO  nortriptyline (PAMELOR) 25 MG capsule  09/04/16   Historical Provider, MD  zaleplon (SONATA) 10 MG capsule Take by mouth.    Historical Provider, MD    Allergies as of 10/20/2016 - Review Complete 09/09/2016  Allergen Reaction Noted  .  Depakote [divalproex sodium] Other (See Comments) 06/15/2015  . Acetaminophen Nausea Only 07/17/2011    Family History  Problem Relation Age of Onset  . Diabetes Mother   . Cancer Mother 1    brain, skin  . Cancer Father 68    kidney, bone, rectal  . Hyperlipidemia Father   . Hypertension Father   . Stroke Father   . Cancer Paternal Aunt     breast  . Diabetes Brother   . Allergies Sister     Social History   Social History  . Marital status: Married    Spouse name: N/A  . Number of children: N/A  . Years of education: N/A   Occupational History  . Not on file.   Social History Main Topics  . Smoking status: Never Smoker  . Smokeless tobacco: Never Used  . Alcohol use No  . Drug use: No  . Sexual activity: Yes    Birth control/ protection: None   Other Topics Concern  . Not on file   Social History Narrative  . No narrative on file    Review of Systems: See HPI, otherwise negative ROS  Physical Exam: BP 126/80   Pulse 80   Temp 99.9 F (37.7 C) (Tympanic)   Resp 16   Ht 5' 2"  (1.575 m)   Wt 208 lb (94.3 kg)   SpO2 97%   BMI 38.04 kg/m  General:   Alert,  pleasant and cooperative in NAD Head:  Normocephalic and atraumatic. Neck:  Supple; no masses or thyromegaly. Lungs:  Clear throughout to auscultation.    Heart:  Regular rate and rhythm. Abdomen:  Soft, nontender and nondistended. Normal bowel sounds, without guarding, and without rebound.   Neurologic:  Alert and  oriented x4;  grossly normal neurologically.  Impression/Plan: AMANII SNETHEN is here for an endoscopy and dilation  to be performed for dysphagia and esophageal stricture  Risks, benefits, limitations, and alternatives regarding  endoscopy and dilation  have been reviewed with the patient.  Questions have been answered.  All parties agreeable.   Jonathon Bellows, MD  11/07/2016, 8:09 AM

## 2016-11-07 NOTE — Anesthesia Postprocedure Evaluation (Signed)
Anesthesia Post Note  Patient: Belinda [name]  Procedure(s) Performed: Procedure(s) (LRB): ESOPHAGOGASTRODUODENOSCOPY (EGD) WITH PROPOFOL (N/A)  Patient location during evaluation: Endoscopy Anesthesia Type: General Level of consciousness: awake and alert and oriented Pain management: pain level controlled Vital Signs Assessment: post-procedure vital signs reviewed and stable Respiratory status: spontaneous breathing, nonlabored ventilation and respiratory function stable Cardiovascular status: blood pressure returned to baseline and stable Postop Assessment: no signs of nausea or vomiting Anesthetic complications: no     Last Vitals:  Vitals:   11/07/16 0858 11/07/16 0908  BP: 116/73 104/63  Pulse: 88 78  Resp: (!) 21 15  Temp:      Last Pain:  Vitals:   11/07/16 0838  TempSrc: Tympanic                 [name]

## 2016-11-07 NOTE — Anesthesia Post-op Follow-up Note (Cosign Needed)
Anesthesia QCDR form completed.        

## 2016-11-07 NOTE — Anesthesia Procedure Notes (Signed)
Performed by: COOK-MARTIN, Tonishia Steffy Pre-anesthesia Checklist: Patient identified, Emergency Drugs available, Suction available, Patient being monitored and Timeout performed Oxygen Delivery Method: Nasal cannula Preoxygenation: Pre-oxygenation with 100% oxygen Intubation Type: IV induction Airway Equipment and Method: Bite block Placement Confirmation: CO2 detector and positive ETCO2     

## 2016-11-07 NOTE — Anesthesia Preprocedure Evaluation (Signed)
Anesthesia Evaluation  Patient identified by MRN, date of birth, ID band Patient awake    Reviewed: Allergy & Precautions, NPO status , Patient's Chart, lab work & pertinent test results  History of Anesthesia Complications Negative for: history of anesthetic complications  Airway Mallampati: II  TM Distance: >3 FB Neck ROM: Full    Dental no notable dental hx.    Pulmonary sleep apnea , neg COPD,    breath sounds clear to auscultation- rhonchi (-) wheezing      Cardiovascular Exercise Tolerance: Good (-) hypertension(-) CAD and (-) Past MI  Rhythm:Regular Rate:Normal - Systolic murmurs and - Diastolic murmurs    Neuro/Psych  Headaches, PSYCHIATRIC DISORDERS Bipolar Disorder    GI/Hepatic Neg liver ROS, PUD, GERD  ,Hx of bariatric surgery    Endo/Other  diabetes (diet controlled)  Renal/GU negative Renal ROS     Musculoskeletal  (+) Arthritis ,   Abdominal (+) + obese,   Peds  Hematology negative hematology ROS (+)   Anesthesia Other Findings Past Medical History: No date: Arthritis     Comment: lower back No date: Bipolar affective disorder (HCC) No date: Deviated septum No date: GERD (gastroesophageal reflux disease) No date: Headache(784.0)     Comment: migraines - none 2 months No date: Peptic ulcer No date: Pre-diabetes No date: Schizophrenia (HCC) No date: Sleep apnea No date: Substance abuse   Reproductive/Obstetrics                             Anesthesia Physical Anesthesia Plan  ASA: III  Anesthesia Plan: General   Post-op Pain Management:    Induction: Intravenous  Airway Management Planned: Natural Airway  Additional Equipment:   Intra-op Plan:   Post-operative Plan:   Informed Consent: I have reviewed the patients History and Physical, chart, labs and discussed the procedure including the risks, benefits and alternatives for the proposed anesthesia with  the patient or authorized representative who has indicated his/her understanding and acceptance.   Dental advisory given  Plan Discussed with: CRNA and Anesthesiologist  Anesthesia Plan Comments:         Anesthesia Quick Evaluation

## 2016-11-07 NOTE — Transfer of Care (Signed)
Immediate Anesthesia Transfer of Care Note  Patient: Belinda [name]  Procedure(s) Performed: Procedure(s): ESOPHAGOGASTRODUODENOSCOPY (EGD) WITH PROPOFOL (N/A)  Patient Location: PACU  Anesthesia Type:General  Level of Consciousness: awake and sedated  Airway & Oxygen Therapy: Patient Spontanous Breathing and Patient connected to nasal cannula oxygen  Post-op Assessment: Report given to RN and Post -op Vital signs reviewed and stable  Post vital signs: Reviewed and stable  Last Vitals:  Vitals:   11/07/16 0749 11/07/16 0838  BP: 126/80 (P) 109/69  Pulse: 80   Resp: 16   Temp: 37.7 C     Last Pain:  Vitals:   11/07/16 0749  TempSrc: Tympanic         Complications: No apparent anesthesia complications

## 2016-11-07 NOTE — Op Note (Signed)
[facility] Gastroenterology Patient Name: Belinda [name] Procedure Date: 11/07/2016 8:10 AM MRN: [id number] Account #: [id number] Date of Birth: November 10, 1975 Admit Type: Outpatient Age: 41 Room: [facility] ENDO ROOM 4 Gender: Female Note Status: Finalized Procedure:            Upper GI endoscopy Indications:          Dysphagia Providers:            Wyline Mood MD, MD Referring MD:         Dorcas Carrow (Referring MD) Medicines:            Monitored Anesthesia Care Complications:        No immediate complications. Procedure:            Pre-Anesthesia Assessment:                       - Prior to the procedure, a History and Physical was                        performed, and patient medications, allergies and                        sensitivities were reviewed. The patient's tolerance of                        previous anesthesia was reviewed.                       - The risks and benefits of the procedure and the                        sedation options and risks were discussed with the                        patient. All questions were answered and informed                        consent was obtained.                       - The risks and benefits of the procedure and the                        sedation options and risks were discussed with the                        patient. All questions were answered and informed                        consent was obtained.                       - ASA Grade Assessment: III - A patient with severe                        systemic disease.                       After obtaining informed consent, the endoscope was  passed under direct vision. Throughout the procedure,                        the patient's blood pressure, pulse, and oxygen                        saturations were monitored continuously. The Endoscope                        was introduced through the mouth, and advanced to the                        third  part of duodenum. The upper GI endoscopy was                        accomplished with ease. The patient tolerated the                        procedure well. Findings:      The esophagus was normal.      Evidence of a gastric bypass was found. A gastric pouch with a normal       size was found. The gastrojejunal anastomosis was characterized by       healthy appearing mucosa. This was traversed. The pouch-to-jejunum limb       was characterized by healthy appearing mucosa. The jejunojejunal       anastomosis was characterized by healthy appearing mucosa.      No stricture seen on endoscopy , A guidewire was placed and the scope       was withdrawn. Dilation was performed with a Savary dilator with no       resistance at 45 Fr. Impression:           - Normal esophagus.                       - Gastric bypass with a normal-sized pouch.                        Gastrojejunal anastomosis characterized by healthy                        appearing mucosa.                       - Esophageal stenoses. Dilated.                       - No specimens collected. Recommendation:       - Discharge patient to home (with escort).                       - Resume previous diet.                       - Continue present medications.                       - Return to my office in 4 weeks. Procedure Code(s):    --- Professional ---                       [id number], Esophagogastroduodenoscopy, flexible, transoral;  with insertion of guide wire followed by passage of                        dilator(s) through esophagus over guide wire Diagnosis Code(s):    --- Professional ---                       X91.47, Bariatric surgery status                       K22.2, Esophageal obstruction                       R13.10, Dysphagia, unspecified CPT copyright 2016 American Medical Association. All rights reserved. The codes documented in this report are preliminary and upon coder review may  be revised to meet  current compliance requirements. Wyline Mood, MD Wyline Mood MD, MD 11/07/2016 8:28:47 AM This report has been signed electronically. Number of Addenda: 0 Note Initiated On: 11/07/2016 8:10 AM      [facility]

## 2016-11-10 ENCOUNTER — Encounter: Payer: Self-pay | Admitting: Gastroenterology

## 2016-11-10 ENCOUNTER — Encounter: Payer: Self-pay | Admitting: Family Medicine

## 2016-11-10 MED ORDER — PROMETHAZINE HCL 25 MG PO TABS: 25.0000 mg | ORAL_TABLET | Freq: Three times a day (TID) | ORAL | 0 refills | Status: DC | PRN

## 2017-01-01 ENCOUNTER — Encounter: Payer: Self-pay | Admitting: Family Medicine

## 2017-01-01 ENCOUNTER — Ambulatory Visit (INDEPENDENT_AMBULATORY_CARE_PROVIDER_SITE_OTHER): Payer: Commercial Managed Care - PPO | Admitting: Family Medicine

## 2017-01-01 VITALS — BP 108/76 | HR 84 | Temp 98.3°F | Resp 17 | Ht 62.0 in | Wt 215.0 lb

## 2017-01-01 DIAGNOSIS — M545 Low back pain, unspecified: Secondary | ICD-10-CM

## 2017-01-01 DIAGNOSIS — Z021 Encounter for pre-employment examination: Secondary | ICD-10-CM | POA: Diagnosis not present

## 2017-01-01 DIAGNOSIS — G8929 Other chronic pain: Secondary | ICD-10-CM | POA: Diagnosis not present

## 2017-01-01 MED ORDER — HYDROCODONE-IBUPROFEN 5-200 MG PO TABS: 1.0000 | ORAL_TABLET | Freq: Three times a day (TID) | ORAL | 0 refills | Status: DC | PRN

## 2017-01-01 NOTE — Progress Notes (Addendum)
BP 108/76 (BP Location: Left Arm, Patient Position: Sitting, Cuff Size: Large)   Pulse 84   Temp 98.3 F (36.8 C) (Oral)   Resp 17   Ht _0  (1.575 m)   Wt 215 lb (97.5 kg)   LMP 12/09/2016   SpO2 96%   BMI 39.32 kg/m    Subjective:    Patient ID: Belinda [name], female    DOB: [birth year], 41 y.o.   MRN: [id number]  HPI: Belinda [name] is a 41 y.o. female  Chief Complaint  Patient presents with  . 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  She went to [employer] and had titers done. She was found to be susceptible to Hep B and mumps and rubella. She was given a vaccination for MMR and HPV. She was also given flu and tdap.    She is feeling well. Needs a refill on her pain medicine. Back is stable. Using it very sparingly. Will not take it at work. She is otherwise doing well with no other concerns or complaints today.  Relevant past medical, surgical, family and social history reviewed and updated as indicated. Interim medical history since our last visit reviewed. Allergies and medications reviewed and updated.  Review of Systems  Constitutional: Negative.   Respiratory: Negative.   Cardiovascular: Negative.   Psychiatric/Behavioral: Negative.     Per HPI unless specifically indicated above     Objective:    BP 108/76 (BP Location: Left Arm, Patient Position: Sitting, Cuff Size: Large)   Pulse 84   Temp 98.3 F (36.8 C) (Oral)   Resp 17   Ht _1  (1.575 m)   Wt 215 lb (97.5 kg)   LMP 12/09/2016   SpO2 96%   BMI 39.32 kg/m   Wt Readings from Last 3 Encounters:  01/01/17 215 lb (97.5 kg)  11/07/16 208 lb (94.3 kg)  10/21/16 212 lb 3.2 oz (96.3 kg)    Physical Exam  Constitutional: She is oriented to person, place, and time. She appears well-developed and well-nourished. No distress.  HENT:  Head: Normocephalic and atraumatic.  Right Ear: Hearing normal.    Left Ear: Hearing normal.  Nose: Nose normal.  Eyes: Conjunctivae and lids are normal. Right eye exhibits no discharge. Left eye exhibits no discharge. No scleral icterus.  Cardiovascular: Normal rate, regular rhythm, normal heart sounds and intact distal pulses.  Exam reveals no gallop and no friction rub.   No murmur heard. Pulmonary/Chest: Effort normal and breath sounds normal. No respiratory distress. She has no wheezes. She has no rales. She exhibits no tenderness.  Musculoskeletal: Normal range of motion.  Neurological: She is alert and oriented to person, place, and time.  Skin: Skin is warm, dry and intact. No rash noted. No erythema. No pallor.  Psychiatric: She has a normal mood and affect. Her speech is normal and behavior is normal. Judgment and thought content normal. Cognition and memory are normal.  Nursing note and vitals reviewed.   Results for orders placed or performed during the hospital encounter of 11/07/16  Pregnancy, urine POC  Result Value Ref Range   Preg Test, Ur NEGATIVE NEGATIVE      Assessment & Plan:   Problem List Items Addressed This Visit    None    Visit Diagnoses    Pre-employment examination    -  Primary  Forms filled out- see scanned documents. Unclear if she got HPV or Hep B vaccine- call out to employee health to check on status.    Chronic bilateral low back pain without sciatica       Refill given today. Call with any concerns.    Relevant Medications   hydrocodone-ibuprofen (VICOPROFEN) 5-200 MG tablet      ADDENDUM: 01/01/17 1:54PM- call back from employee health. Hep B#1 given 12/26/16. Chart updated accordingly.  Follow up plan: Return if symptoms worsen or fail to improve.

## 2017-01-23 ENCOUNTER — Other Ambulatory Visit: Payer: Self-pay | Admitting: Family Medicine

## 2017-01-23 ENCOUNTER — Encounter: Payer: Self-pay | Admitting: Family Medicine

## 2017-01-23 MED ORDER — HYDROCODONE-IBUPROFEN 7.5-200 MG PO TABS: ORAL_TABLET | ORAL | 0 refills | Status: DC

## 2017-01-23 NOTE — Telephone Encounter (Signed)
-----   Message from Hyman Bible, CMA sent at 01/23/2017  4:29 PM EDT ----- Spoke with pharmacist She states that they are not sure if they are available, they will have to try to order them, which will be Monday and will not know if they are available until Wednesday when the shipment comes.   They have the 7.5/ 200 in stock

## 2017-01-23 NOTE — Telephone Encounter (Signed)
Will give her higher dose as they are out of stock and she's out. Rx up front for her to pick up.

## 2017-01-31 ENCOUNTER — Other Ambulatory Visit: Payer: Self-pay | Admitting: Family Medicine

## 2017-02-06 ENCOUNTER — Other Ambulatory Visit: Payer: Self-pay | Admitting: Family Medicine

## 2017-02-06 ENCOUNTER — Telehealth: Payer: Self-pay | Admitting: Family Medicine

## 2017-02-06 DIAGNOSIS — Z021 Encounter for pre-employment examination: Secondary | ICD-10-CM

## 2017-02-06 DIAGNOSIS — Z111 Encounter for screening for respiratory tuberculosis: Secondary | ICD-10-CM

## 2017-02-06 NOTE — Telephone Encounter (Signed)
Patient called in regards to see about the forms (physical forms) she dropped off earlier today. Patient called in regards to see how long it would take and when she should expect to come up and and pick the forms up. Patient also stated that if there is anything she needs to verify or come in to fell out that she would do so.   Please Advise.    Patient contact number: [phone]   Thank you.

## 2017-02-09 NOTE — Telephone Encounter (Signed)
Forms filled out and picked up by patient.

## 2017-02-26 ENCOUNTER — Ambulatory Visit: Payer: Commercial Managed Care - PPO | Admitting: Family Medicine

## 2017-03-02 ENCOUNTER — Encounter: Payer: Self-pay | Admitting: Family Medicine

## 2017-03-02 ENCOUNTER — Other Ambulatory Visit: Payer: Self-pay | Admitting: Family Medicine

## 2017-03-04 ENCOUNTER — Ambulatory Visit (INDEPENDENT_AMBULATORY_CARE_PROVIDER_SITE_OTHER): Payer: Commercial Managed Care - PPO | Admitting: Family Medicine

## 2017-03-04 ENCOUNTER — Encounter: Payer: Self-pay | Admitting: Family Medicine

## 2017-03-04 VITALS — BP 119/83 | HR 70 | Temp 98.0°F | Wt 210.8 lb

## 2017-03-04 DIAGNOSIS — M4696 Unspecified inflammatory spondylopathy, lumbar region: Secondary | ICD-10-CM | POA: Diagnosis not present

## 2017-03-04 DIAGNOSIS — E538 Deficiency of other specified B group vitamins: Secondary | ICD-10-CM

## 2017-03-04 DIAGNOSIS — E119 Type 2 diabetes mellitus without complications: Secondary | ICD-10-CM | POA: Diagnosis not present

## 2017-03-04 DIAGNOSIS — M47816 Spondylosis without myelopathy or radiculopathy, lumbar region: Secondary | ICD-10-CM

## 2017-03-04 LAB — UA/M W/RFLX CULTURE, ROUTINE
Bilirubin, UA: NEGATIVE
GLUCOSE, UA: NEGATIVE
Ketones, UA: NEGATIVE
Leukocytes, UA: NEGATIVE
Nitrite, UA: NEGATIVE
Protein, UA: NEGATIVE
RBC, UA: NEGATIVE
Specific Gravity, UA: 1.015 (ref 1.005–1.030)
UUROB: 1 mg/dL (ref 0.2–1.0)
pH, UA: 7.5 (ref 5.0–7.5)

## 2017-03-04 LAB — BAYER DCA HB A1C WAIVED: HB A1C (BAYER DCA - WAIVED): 5.4 % (ref <7.0)

## 2017-03-04 MED ORDER — CELECOXIB 200 MG PO CAPS: 200.0000 mg | ORAL_CAPSULE | Freq: Two times a day (BID) | ORAL | 3 refills | Status: DC

## 2017-03-04 MED ORDER — HYDROCODONE-IBUPROFEN 7.5-200 MG PO TABS: ORAL_TABLET | ORAL | 0 refills | Status: DC

## 2017-03-04 NOTE — Progress Notes (Signed)
BP 119/83 (BP Location: Left Arm, Patient Position: Sitting, Cuff Size: Large)   Pulse 70   Temp 98 F (36.7 C)   Wt 210 lb 12.8 oz (95.6 kg)   LMP 03/03/2017   SpO2 97%   BMI 38.56 kg/m    Subjective:    Patient ID: Belinda [name], female    DOB: 05-20-1976, 41 y.o.   MRN: [id number]  HPI: Belinda [name] is a 41 y.o. female  Chief Complaint  Patient presents with  . Pain    back and hip, refill on hydrocodone/motrin   BACK PAIN- she notes that her pain was really bad the first couple of weeks that she started a new job, but it seems to be better  Duration: Threw her back out last summer- Felt better after about 4 weeks after that, but continued to take pain pills about 4x a week since then, usually taking it in the evenings when pain would get worse Mechanism of injury: unknown Location: bilateral, low back and upper back Onset: gradual Severity: moderate Quality: dull aching, throbbing Frequency: intermittent Radiation: none Aggravating factors: work Alleviating factors: medicine Status: fluctuating Treatments attempted: rest, ice, heat, APAP, ibuprofen, aleve and physical therapy  Relief with NSAIDs?: mild Nighttime pain:  no Paresthesias / decreased sensation:  no Bowel / bladder incontinence:  no Fevers:  no Dysuria / urinary frequency:  no  DIABETES Hypoglycemic episodes:yes Polydipsia/polyuria: no Visual disturbance: no Chest pain: no Paresthesias: no Glucose Monitoring: yes Taking Insulin?: no Blood Pressure Monitoring: not checking Retinal Examination: Not up to Date Foot Exam: Not up to Date Diabetic Education: Not Completed Pneumovax: unknown Influenza: unknown Aspirin: no  Relevant past medical, surgical, family and social history reviewed and updated as indicated. Interim medical history since our last visit reviewed. Allergies and medications reviewed and updated.  Review of Systems  Constitutional: Negative.   Respiratory: Negative.    Cardiovascular: Negative.   Musculoskeletal: Positive for back pain and myalgias. Negative for arthralgias, gait problem, joint swelling, neck pain and neck stiffness.  Neurological: Negative.   Psychiatric/Behavioral: Negative.     Per HPI unless specifically indicated above     Objective:    BP 119/83 (BP Location: Left Arm, Patient Position: Sitting, Cuff Size: Large)   Pulse 70   Temp 98 F (36.7 C)   Wt 210 lb 12.8 oz (95.6 kg)   LMP 03/03/2017   SpO2 97%   BMI 38.56 kg/m   Wt Readings from Last 3 Encounters:  03/04/17 210 lb 12.8 oz (95.6 kg)  01/01/17 215 lb (97.5 kg)  11/07/16 208 lb (94.3 kg)    Physical Exam  Constitutional: She is oriented to person, place, and time. She appears well-developed and well-nourished. No distress.  HENT:  Head: Normocephalic and atraumatic.  Right Ear: Hearing normal.  Left Ear: Hearing normal.  Nose: Nose normal.  Eyes: Conjunctivae and lids are normal. Right eye exhibits no discharge. Left eye exhibits no discharge. No scleral icterus.  Cardiovascular: Normal rate, regular rhythm, normal heart sounds and intact distal pulses.  Exam reveals no gallop and no friction rub.   No murmur heard. Pulmonary/Chest: Effort normal and breath sounds normal. No respiratory distress. She has no wheezes. She has no rales. She exhibits no tenderness.  Musculoskeletal: Normal range of motion.  Neurological: She is alert and oriented to person, place, and time.  Skin: Skin is warm, dry and intact. No rash noted. She is not diaphoretic. No erythema. No pallor.  Psychiatric:  She has a normal mood and affect. Her speech is normal and behavior is normal. Judgment and thought content normal. Cognition and memory are normal.  Nursing note and vitals reviewed.   Results for orders placed or performed during the hospital encounter of 11/07/16  Pregnancy, urine POC  Result Value Ref Range   Preg Test, Ur NEGATIVE NEGATIVE      Assessment & Plan:    Problem List Items Addressed This Visit      Endocrine   Diabetes (HCC)    A1c 5.4. Continue to monitor. Call with any concerns. Recheck 6 months.       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 She will try celebrex and stop meloxicam to see if that helps. Will only take medicine for severe pain. Will give her refill of her medicine today, but it is to last 3 months. No refills will be given before that.       Relevant Medications   meloxicam (MOBIC) 15 MG tablet   tiZANidine (ZANAFLEX) 4 MG capsule   traMADol (ULTRAM) 50 MG tablet   HYDROcodone-ibuprofen (VICOPROFEN) 7.5-200 MG tablet   celecoxib (CELEBREX) 200 MG capsule     Other   B12 deficiency    Rechecking levels today. Await results. Call with any concerns.       Relevant Orders   CBC with Differential/Platelet   B12 and Folate Panel       Follow up plan: No Follow-up on file.

## 2017-03-04 NOTE — Assessment & Plan Note (Signed)
Rechecking levels today. Await results. Call with any concerns.  

## 2017-03-04 NOTE — Assessment & Plan Note (Signed)
A1c 5.4. Continue to monitor. Call with any concerns. Recheck 6 months.

## 2017-03-04 NOTE — Assessment & Plan Note (Signed)
Discussed with [name] that she has been using pain medicine more often than we would like. She will try celebrex and stop meloxicam to see if that helps. Will only take medicine for severe pain. Will give her refill of her medicine today, but it is to last 3 months. No refills will be given before that.

## 2017-03-05 LAB — COMPREHENSIVE METABOLIC PANEL
ALT: 9 IU/L (ref 0–32)
AST: 13 IU/L (ref 0–40)
Albumin/Globulin Ratio: 1.4 (ref 1.2–2.2)
Albumin: 3.9 g/dL (ref 3.5–5.5)
Alkaline Phosphatase: 76 IU/L (ref 39–117)
BILIRUBIN TOTAL: 0.3 mg/dL (ref 0.0–1.2)
BUN/Creatinine Ratio: 11 (ref 9–23)
BUN: 9 mg/dL (ref 6–24)
CO2: 23 mmol/L (ref 18–29)
CREATININE: 0.83 mg/dL (ref 0.57–1.00)
Calcium: 9.2 mg/dL (ref 8.7–10.2)
Chloride: 104 mmol/L (ref 96–106)
GFR calc non Af Amer: 88 mL/min/{1.73_m2} (ref >59)
GFR, EST AFRICAN AMERICAN: 101 mL/min/{1.73_m2} (ref >59)
GLUCOSE: 93 mg/dL (ref 65–99)
Globulin, Total: 2.7 g/dL (ref 1.5–4.5)
Potassium: 4.9 mmol/L (ref 3.5–5.2)
Sodium: 138 mmol/L (ref 134–144)
TOTAL PROTEIN: 6.6 g/dL (ref 6.0–8.5)

## 2017-03-05 LAB — CBC WITH DIFFERENTIAL/PLATELET
BASOS: 1 %
Basophils Absolute: 0 10*3/uL (ref 0.0–0.2)
EOS (ABSOLUTE): 0.1 10*3/uL (ref 0.0–0.4)
Eos: 2 %
Hematocrit: 37.2 % (ref 34.0–46.6)
Hemoglobin: 12.1 g/dL (ref 11.1–15.9)
Immature Grans (Abs): 0 10*3/uL (ref 0.0–0.1)
Immature Granulocytes: 0 %
LYMPHS ABS: 2.7 10*3/uL (ref 0.7–3.1)
Lymphs: 35 %
MCH: 29.4 pg (ref 26.6–33.0)
MCHC: 32.5 g/dL (ref 31.5–35.7)
MCV: 90 fL (ref 79–97)
Monocytes Absolute: 0.5 10*3/uL (ref 0.1–0.9)
Monocytes: 7 %
NEUTROS ABS: 4.2 10*3/uL (ref 1.4–7.0)
Neutrophils: 55 %
PLATELETS: 299 10*3/uL (ref 150–379)
RBC: 4.12 x10E6/uL (ref 3.77–5.28)
RDW: 14 % (ref 12.3–15.4)
WBC: 7.6 10*3/uL (ref 3.4–10.8)

## 2017-03-05 LAB — B12 AND FOLATE PANEL
Folate: 7.2 ng/mL (ref >3.0)
Vitamin B-12: 325 pg/mL (ref 232–1245)

## 2017-03-05 LAB — LIPID PANEL W/O CHOL/HDL RATIO
CHOLESTEROL TOTAL: 165 mg/dL (ref 100–199)
HDL: 73 mg/dL (ref >39)
LDL Calculated: 67 mg/dL (ref 0–99)
Triglycerides: 125 mg/dL (ref 0–149)
VLDL CHOLESTEROL CAL: 25 mg/dL (ref 5–40)

## 2017-03-05 NOTE — Progress Notes (Signed)
Notified pt by mychart

## 2017-03-09 ENCOUNTER — Ambulatory Visit: Payer: Managed Care, Other (non HMO) | Admitting: Family Medicine

## 2017-03-10 ENCOUNTER — Ambulatory Visit: Payer: Commercial Managed Care - PPO | Admitting: Family Medicine

## 2017-04-15 ENCOUNTER — Ambulatory Visit (INDEPENDENT_AMBULATORY_CARE_PROVIDER_SITE_OTHER): Payer: Commercial Managed Care - PPO | Admitting: Family Medicine

## 2017-04-15 ENCOUNTER — Encounter: Payer: Self-pay | Admitting: Family Medicine

## 2017-04-15 VITALS — BP 110/74 | HR 57 | Temp 98.4°F | Wt 213.0 lb

## 2017-04-15 DIAGNOSIS — J069 Acute upper respiratory infection, unspecified: Secondary | ICD-10-CM

## 2017-04-15 MED ORDER — PREDNISONE 20 MG PO TABS: 40.0000 mg | ORAL_TABLET | Freq: Every day | ORAL | 0 refills | Status: DC

## 2017-04-15 MED ORDER — PSEUDOEPHEDRINE HCL 30 MG PO TABS: 30.0000 mg | ORAL_TABLET | ORAL | 0 refills | Status: DC | PRN

## 2017-04-15 MED ORDER — DM-GUAIFENESIN ER 30-600 MG PO TB12: 1.0000 | ORAL_TABLET | Freq: Two times a day (BID) | ORAL | 0 refills | Status: DC

## 2017-04-17 NOTE — Progress Notes (Signed)
   BP 110/74   Pulse (!) 57   Temp 98.4 F (36.9 C)   Wt 213 lb (96.6 kg)   SpO2 99%   BMI 38.96 kg/m    Subjective:    Patient ID: Belinda [name], female    DOB: 1976/07/31, 41 y.o.   MRN: [id number]  HPI: Belinda LevanSusan D Resurreccion is a 41 y.o. female  Chief Complaint  Patient presents with  . URI    x 3 days, sinus pain/pressure, achey, cough, some chest congestion. No sinus drainage, no fever, no sore throat.    Chest tightness, aches, sinus pressure/pain, and cough x 3 days. Denies fever, chills, sore throat, CP, SOB. Trying dayquil with no relief. Takes claritin regularly for seasonal allergies. Works in a daycare, so lots of sick contacts.   Relevant past medical, surgical, family and social history reviewed and updated as indicated. Interim medical history since our last visit reviewed. Allergies and medications reviewed and updated.  Review of Systems  Constitutional: Negative.   HENT: Positive for congestion, sinus pain and sinus pressure.   Eyes: Negative.   Respiratory: Positive for cough and chest tightness.   Cardiovascular: Negative.   Gastrointestinal: Negative.   Genitourinary: Negative.   Musculoskeletal: Positive for myalgias.  Neurological: Negative.   Psychiatric/Behavioral: Negative.     Per HPI unless specifically indicated above     Objective:    BP 110/74   Pulse (!) 57   Temp 98.4 F (36.9 C)   Wt 213 lb (96.6 kg)   SpO2 99%   BMI 38.96 kg/m   Wt Readings from Last 3 Encounters:  04/15/17 213 lb (96.6 kg)  03/04/17 210 lb 12.8 oz (95.6 kg)  01/01/17 215 lb (97.5 kg)    Physical Exam  Constitutional: She is oriented to person, place, and time. She appears well-developed and well-nourished. No distress.  HENT:  Head: Atraumatic.  Right Ear: External ear normal.  Left Ear: External ear normal.  Eyes: Pupils are equal, round, and reactive to light. Conjunctivae are normal.  Neck: Normal range of motion. Neck supple.  Cardiovascular: Normal  rate and normal heart sounds.   Pulmonary/Chest: Effort normal and breath sounds normal. No respiratory distress. She has no wheezes.  Musculoskeletal: Normal range of motion.  Neurological: She is alert and oriented to person, place, and time.  Skin: Skin is warm and dry.  Psychiatric: She has a normal mood and affect. Her behavior is normal.  Nursing note and vitals reviewed.     Assessment & Plan:   Problem List Items Addressed This Visit    None    Visit Diagnoses    Viral URI    -  Primary   Prednisone, sudafed, and mucinex sent. Discussed OTC cough syrups and vapor rubs prn. Supportive care reviewed. F/u if worsening or no improvement       Follow up plan: Return if symptoms worsen or fail to improve.

## 2017-04-24 ENCOUNTER — Other Ambulatory Visit: Payer: Self-pay | Admitting: Family Medicine

## 2017-05-14 ENCOUNTER — Encounter (HOSPITAL_COMMUNITY): Payer: Self-pay

## 2017-05-20 ENCOUNTER — Other Ambulatory Visit: Payer: Self-pay | Admitting: Family Medicine

## 2017-05-25 ENCOUNTER — Other Ambulatory Visit: Payer: Self-pay | Admitting: Family Medicine

## 2017-06-05 ENCOUNTER — Ambulatory Visit: Payer: Self-pay | Admitting: Family Medicine

## 2017-06-09 ENCOUNTER — Telehealth: Payer: Self-pay | Admitting: Family Medicine

## 2017-06-09 MED ORDER — PROMETHAZINE HCL 25 MG PO TABS: 25.0000 mg | ORAL_TABLET | Freq: Three times a day (TID) | ORAL | 0 refills | Status: DC | PRN

## 2017-06-09 NOTE — Telephone Encounter (Signed)
Routing to provider  

## 2017-06-09 NOTE — Telephone Encounter (Signed)
Pt called and stated that she thinks she has a virus. She stated that she has diarrhea and is also throwing up. She would like to know if [facility] [place],

## 2017-06-09 NOTE — Telephone Encounter (Signed)
Rx at her pharmacy 

## 2017-06-17 ENCOUNTER — Other Ambulatory Visit: Payer: Self-pay | Admitting: Family Medicine

## 2017-06-23 ENCOUNTER — Ambulatory Visit (INDEPENDENT_AMBULATORY_CARE_PROVIDER_SITE_OTHER): Payer: 59 | Admitting: Family Medicine

## 2017-06-23 ENCOUNTER — Encounter: Payer: Self-pay | Admitting: Family Medicine

## 2017-06-23 VITALS — BP 118/84 | HR 68 | Temp 98.5°F | Wt 208.3 lb

## 2017-06-23 DIAGNOSIS — R112 Nausea with vomiting, unspecified: Secondary | ICD-10-CM | POA: Diagnosis not present

## 2017-06-23 DIAGNOSIS — M4696 Unspecified inflammatory spondylopathy, lumbar region: Secondary | ICD-10-CM

## 2017-06-23 DIAGNOSIS — M47816 Spondylosis without myelopathy or radiculopathy, lumbar region: Secondary | ICD-10-CM

## 2017-06-23 MED ORDER — ONDANSETRON HCL 4 MG PO TABS: 4.0000 mg | ORAL_TABLET | Freq: Three times a day (TID) | ORAL | 1 refills | Status: DC | PRN

## 2017-06-23 MED ORDER — HYDROCODONE-IBUPROFEN 7.5-200 MG PO TABS: ORAL_TABLET | ORAL | 0 refills | Status: DC

## 2017-06-23 MED ORDER — CELECOXIB 200 MG PO CAPS: 200.0000 mg | ORAL_CAPSULE | Freq: Two times a day (BID) | ORAL | 3 refills | Status: DC

## 2017-06-23 MED ORDER — TIZANIDINE HCL 4 MG PO CAPS: ORAL_CAPSULE | ORAL | 6 refills | Status: DC

## 2017-06-23 MED ORDER — CYANOCOBALAMIN 1000 MCG/ML IJ SOLN: INTRAMUSCULAR | 12 refills | Status: DC

## 2017-06-23 MED ORDER — PROMETHAZINE HCL 25 MG PO TABS: 25.0000 mg | ORAL_TABLET | Freq: Three times a day (TID) | ORAL | 3 refills | Status: DC | PRN

## 2017-06-23 NOTE — Assessment & Plan Note (Signed)
Doing much better with the celebrex. Using pain medicine appropriately. Refill given today. Continue stretching. Call with any concerns.

## 2017-06-23 NOTE — Progress Notes (Signed)
BP 118/84 (BP Location: Left Arm, Patient Position: Sitting, Cuff Size: Large)   Pulse 68   Temp 98.5 F (36.9 C)   Wt 208 lb 5 oz (94.5 kg)   LMP 06/22/2017 (Exact Date)   SpO2 98%   BMI 38.10 kg/m    Subjective:    Patient ID: Belinda [name], female    DOB: [birth year], 41 y.o.   MRN: [id number]  HPI: Belinda [name] is a 41 y.o. female  Chief Complaint  Patient presents with  . Pain  . Emesis   BACK PAIN- Pain has been a lot better, celebrex is really helping. Taking the pain medicine about 2-3x a week. Hip is acting up a bit, not as bad as before, but still acting up. Duration: Threw her back out last summer- Felt better after about 4 weeks after that Mechanism of injury: unknown Location: bilateral, low back and upper back Onset: gradual Severity: moderate Quality: dull aching, throbbing Frequency: intermittent Radiation: none Aggravating factors: work Alleviating factors: medicine Status: fluctuating Treatments attempted: rest, ice, heat, APAP, ibuprofen, aleve and physical therapy  Relief with NSAIDs?: mild Nighttime pain:  no Paresthesias / decreased sensation:  no Bowel / bladder incontinence:  no Fevers:  no Dysuria / urinary frequency:  no  ABDOMINAL ISSUES Duration: couple of weeks Nature: nausea Episode duration: 4-5x a day, 2-3x a week Frequency: 2-3x a week Alleviating factors: baby food, bland diet Aggravating factors: greasy foods Treatments attempted: PPI Constipation: no Diarrhea: yes Episodes of diarrhea: 2-3x a week, same time as the nausea Mucous in the stool: no Heartburn: no Bloating:no Flatulence: no Nausea: yes Vomiting: yes Melena or hematochezia: no Rash: no Jaundice: no Fever: no Weight loss: no   Relevant past medical, surgical, family and social history reviewed and updated as indicated. Interim medical history since our last visit reviewed. Allergies and medications reviewed and updated.  Review of Systems    Constitutional: Negative.   Respiratory: Negative.   Cardiovascular: Negative.   Gastrointestinal: Positive for diarrhea, nausea and vomiting. Negative for abdominal distention, abdominal pain, anal bleeding, blood in stool, constipation and rectal pain.  Endocrine: Negative.   Musculoskeletal: Positive for back pain, gait problem and myalgias. Negative for arthralgias, joint swelling, neck pain and neck stiffness.  Psychiatric/Behavioral: Negative.     Per HPI unless specifically indicated above     Objective:    BP 118/84 (BP Location: Left Arm, Patient Position: Sitting, Cuff Size: Large)   Pulse 68   Temp 98.5 F (36.9 C)   Wt 208 lb 5 oz (94.5 kg)   LMP 06/22/2017 (Exact Date)   SpO2 98%   BMI 38.10 kg/m   Wt Readings from Last 3 Encounters:  06/23/17 208 lb 5 oz (94.5 kg)  04/15/17 213 lb (96.6 kg)  03/04/17 210 lb 12.8 oz (95.6 kg)    Physical Exam  Constitutional: She is oriented to person, place, and time. She appears well-developed and well-nourished. No distress.  HENT:  Head: Normocephalic and atraumatic.  Right Ear: Hearing normal.  Left Ear: Hearing normal.  Nose: Nose normal.  Eyes: Conjunctivae and lids are normal. Right eye exhibits no discharge. Left eye exhibits no discharge. No scleral icterus.  Cardiovascular: Normal rate, regular rhythm, normal heart sounds and intact distal pulses.  Exam reveals no gallop and no friction rub.   No murmur heard. Pulmonary/Chest: Effort normal and breath sounds normal. No respiratory distress. She has no wheezes. She has no rales. She exhibits no tenderness.  Abdominal: Soft. Bowel sounds are normal. She exhibits no distension and no mass. There is no tenderness. There is no rebound and no guarding.  Musculoskeletal: Normal range of motion.  Neurological: She is alert and oriented to person, place, and time.  Skin: Skin is warm, dry and intact. No rash noted. She is not diaphoretic. No erythema. No pallor.   Psychiatric: She has a normal mood and affect. Her speech is normal and behavior is normal. Judgment and thought content normal. Cognition and memory are normal.  Nursing note and vitals reviewed.   Results for orders placed or performed in visit on 03/04/17  Comprehensive metabolic panel  Result Value Ref Range   Glucose 93 65 - 99 mg/dL   BUN 9 6 - 24 mg/dL   Creatinine, Ser 1.61 0.57 - 1.00 mg/dL   GFR calc non Af Amer 88 >59 mL/min/1.73   GFR calc Af Amer 101 >59 mL/min/1.73   BUN/Creatinine Ratio 11 9 - 23   Sodium 138 134 - 144 mmol/L   Potassium 4.9 3.5 - 5.2 mmol/L   Chloride 104 96 - 106 mmol/L   CO2 23 18 - 29 mmol/L   Calcium 9.2 8.7 - 10.2 mg/dL   Total Protein 6.6 6.0 - 8.5 g/dL   Albumin 3.9 3.5 - 5.5 g/dL   Globulin, Total 2.7 1.5 - 4.5 g/dL   Albumin/Globulin Ratio 1.4 1.2 - 2.2   Bilirubin Total 0.3 0.0 - 1.2 mg/dL   Alkaline Phosphatase 76 39 - 117 IU/L   AST 13 0 - 40 IU/L   ALT 9 0 - 32 IU/L  Lipid Panel w/o Chol/HDL Ratio  Result Value Ref Range   Cholesterol, Total 165 100 - 199 mg/dL   Triglycerides 096 0 - 149 mg/dL   HDL 73 >04 mg/dL   VLDL Cholesterol Cal 25 5 - 40 mg/dL   LDL Calculated 67 0 - 99 mg/dL  CBC with Differential/Platelet  Result Value Ref Range   WBC 7.6 3.4 - 10.8 x10E3/uL   RBC 4.12 3.77 - 5.28 x10E6/uL   Hemoglobin 12.1 11.1 - 15.9 g/dL   Hematocrit 54.0 98.1 - 46.6 %   MCV 90 79 - 97 fL   MCH 29.4 26.6 - 33.0 pg   MCHC 32.5 31.5 - 35.7 g/dL   RDW 19.1 47.8 - 29.5 %   Platelets 299 150 - 379 x10E3/uL   Neutrophils 55 Not Estab. %   Lymphs 35 Not Estab. %   Monocytes 7 Not Estab. %   Eos 2 Not Estab. %   Basos 1 Not Estab. %   Neutrophils Absolute 4.2 1.4 - 7.0 x10E3/uL   Lymphocytes Absolute 2.7 0.7 - 3.1 x10E3/uL   Monocytes Absolute 0.5 0.1 - 0.9 x10E3/uL   EOS (ABSOLUTE) 0.1 0.0 - 0.4 x10E3/uL   Basophils Absolute 0.0 0.0 - 0.2 x10E3/uL   Immature Granulocytes 0 Not Estab. %   Immature Grans (Abs) 0.0 0.0 - 0.1  x10E3/uL  UA/M w/rflx Culture, Routine  Result Value Ref Range   Specific Gravity, UA 1.015 1.005 - 1.030   pH, UA 7.5 5.0 - 7.5   Color, UA Yellow Yellow   Appearance Ur Clear Clear   Leukocytes, UA Negative Negative   Protein, UA Negative Negative/Trace   Glucose, UA Negative Negative   Ketones, UA Negative Negative   RBC, UA Negative Negative   Bilirubin, UA Negative Negative   Urobilinogen, Ur 1.0 0.2 - 1.0 mg/dL   Nitrite, UA Negative Negative  Bayer DCA Hb A1c Waived  Result Value Ref Range   Bayer DCA Hb A1c Waived 5.4 <7.0 %  B12 and Folate Panel  Result Value Ref Range   Vitamin B-12 325 232 - 1,245 pg/mL   Folate 7.2 >3.0 ng/mL      Assessment & Plan:   Problem List Items Addressed This Visit      Musculoskeletal and Integument   Arthritis of lumbar spine (HCC)    Doing much better with the celebrex. Using pain medicine appropriately. Refill given today. Continue stretching. Call with any concerns.       Relevant Medications   HYDROcodone-ibuprofen (VICOPROFEN) 7.5-200 MG tablet   tiZANidine (ZANAFLEX) 4 MG capsule   celecoxib (CELEBREX) 200 MG capsule    Other Visit Diagnoses    Nausea and vomiting, intractability of vomiting not specified, unspecified vomiting type    -  Primary   Significantly worse over the last few weeks. Will check blood work. Will get her back into see GI- appointment made for her today. Call with any concerns.    Relevant Orders   Comprehensive metabolic panel   CBC with Differential/Platelet   Thyroid Panel With TSH       Follow up plan: Return in about 3 months (around 09/22/2017).

## 2017-06-23 NOTE — Patient Instructions (Signed)
[facility]- Dr. Tobi Bastos [address]  [address]  [place], [place] [address]  [phone]   October 10, 9:45AM You are on a call list and they will call you if they have an earlier appointment.

## 2017-06-24 LAB — COMPREHENSIVE METABOLIC PANEL
ALK PHOS: 82 IU/L (ref 39–117)
ALT: 11 IU/L (ref 0–32)
AST: 11 IU/L (ref 0–40)
Albumin/Globulin Ratio: 1.5 (ref 1.2–2.2)
Albumin: 3.9 g/dL (ref 3.5–5.5)
BUN/Creatinine Ratio: 13 (ref 9–23)
BUN: 10 mg/dL (ref 6–24)
Bilirubin Total: 0.4 mg/dL (ref 0.0–1.2)
CALCIUM: 8.8 mg/dL (ref 8.7–10.2)
CO2: 22 mmol/L (ref 20–29)
CREATININE: 0.78 mg/dL (ref 0.57–1.00)
Chloride: 101 mmol/L (ref 96–106)
GFR calc Af Amer: 109 mL/min/{1.73_m2} (ref >59)
GFR, EST NON AFRICAN AMERICAN: 95 mL/min/{1.73_m2} (ref >59)
Globulin, Total: 2.6 g/dL (ref 1.5–4.5)
Glucose: 85 mg/dL (ref 65–99)
POTASSIUM: 4.5 mmol/L (ref 3.5–5.2)
Sodium: 137 mmol/L (ref 134–144)
Total Protein: 6.5 g/dL (ref 6.0–8.5)

## 2017-06-24 LAB — CBC WITH DIFFERENTIAL/PLATELET
BASOS: 0 %
Basophils Absolute: 0 10*3/uL (ref 0.0–0.2)
EOS (ABSOLUTE): 0.2 10*3/uL (ref 0.0–0.4)
EOS: 2 %
Hematocrit: 36.3 % (ref 34.0–46.6)
Hemoglobin: 11.7 g/dL (ref 11.1–15.9)
IMMATURE GRANS (ABS): 0 10*3/uL (ref 0.0–0.1)
Immature Granulocytes: 0 %
LYMPHS: 30 %
Lymphocytes Absolute: 2.6 10*3/uL (ref 0.7–3.1)
MCH: 28.4 pg (ref 26.6–33.0)
MCHC: 32.2 g/dL (ref 31.5–35.7)
MCV: 88 fL (ref 79–97)
Monocytes Absolute: 0.5 10*3/uL (ref 0.1–0.9)
Monocytes: 6 %
NEUTROS PCT: 62 %
Neutrophils Absolute: 5.4 10*3/uL (ref 1.4–7.0)
PLATELETS: 307 10*3/uL (ref 150–379)
RBC: 4.12 x10E6/uL (ref 3.77–5.28)
RDW: 14 % (ref 12.3–15.4)
WBC: 8.7 10*3/uL (ref 3.4–10.8)

## 2017-06-24 LAB — THYROID PANEL WITH TSH
Free Thyroxine Index: 2 (ref 1.2–4.9)
T3 UPTAKE RATIO: 24 % (ref 24–39)
T4 TOTAL: 8.3 ug/dL (ref 4.5–12.0)
TSH: 1.56 u[IU]/mL (ref 0.450–4.500)

## 2017-07-06 ENCOUNTER — Other Ambulatory Visit: Payer: Self-pay | Admitting: Family Medicine

## 2017-07-07 ENCOUNTER — Encounter: Payer: Self-pay | Admitting: Family Medicine

## 2017-07-08 MED ORDER — CYANOCOBALAMIN 1000 MCG/ML IJ SOLN: INTRAMUSCULAR | 12 refills | Status: DC

## 2017-07-08 NOTE — Telephone Encounter (Signed)
Dr.Johnson, can you please send her B 12 injections to [facility]

## 2017-07-15 ENCOUNTER — Ambulatory Visit (INDEPENDENT_AMBULATORY_CARE_PROVIDER_SITE_OTHER): Payer: 59 | Admitting: Gastroenterology

## 2017-07-15 ENCOUNTER — Encounter: Payer: Self-pay | Admitting: Gastroenterology

## 2017-07-15 VITALS — BP 107/73 | HR 86 | Temp 98.7°F | Wt 204.0 lb

## 2017-07-15 DIAGNOSIS — R112 Nausea with vomiting, unspecified: Secondary | ICD-10-CM | POA: Diagnosis not present

## 2017-07-15 NOTE — Progress Notes (Signed)
Jonathon Bellows MD, MRCP(U.K) [address]  [place]  [place], [place] [address]  Main: [phone]  Fax: [phone]   Primary Care Physician: Valerie Roys, DO  Primary Gastroenterologist:  Dr. Jonathon Bellows   No chief complaint on file.   HPI: Belinda [name] is a 41 y.o. female  She is here today to follow up for nausea and vomiting.   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 Occurs every day , atleast once a day.She was last seen at my office back in 07/2016 . She had been on a PPI , was on hydrocodone BID for leg pain . 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 Due to persistent dysphagia in 11/2016 I empirically dilated her esophagus to 15 mm.    Interval history   2017- 07/15/2017   No issues with swallowing. She states that the nausea and vomiting comes on and off, occurs in clusters. Denies any headaches. When she throws up usually right after she eats at times and sometimes many hours later. Can occur 10 mins after she leaves the restroom, throw up the food when she eats, also throws up bile. She takes her protonix daily before meals.  No exposure to marijuana or cigarette smoking . Uses Tramadol once a week . She takes hydrocodone with ibuprofen. Stopped ibuprofen a few weeks back. She was seen at Dr Durenda Age office on 06/23/17 and complained again of nausea , some diarrhea .Thyroid function tests,CMP,CBC were normal. Since she saw Dr Wynetta Emery not progressed. Last episode of vomiting was 3 days back .   She is under stress. Symptoms do not correlate to stressfull events.     Current Outpatient Prescriptions  Medication Sig Dispense Refill  . ARIPiprazole (ABILIFY) 30 MG tablet Take 30 mg by mouth at bedtime.     . B-D 3CC LUER-LOK SYR 25GX1" 25G X 1" 3 ML MISC     . benztropine (COGENTIN) 1 MG tablet TAKE 1 TABLET BY MOUTH EVERY DAY IN THE MORNING  1  . blood glucose  meter kit and supplies KIT Dispense based on patient and insurance preference. Use up to four times daily as directed. (FOR ICD-9 250.00, 250.01). 1 each 0  . buPROPion (WELLBUTRIN XL) 150 MG 24 hr tablet Take 450 mg by mouth daily.     . celecoxib (CELEBREX) 200 MG capsule Take 1 capsule (200 mg total) by mouth 2 (two) times daily. 180 capsule 3  . clonazePAM (KLONOPIN) 0.5 MG tablet     . [facility] GLUCOSE 4-6 GM-MG CHEW chewable tablet CHEW 3 TABLETS (15 G TOTAL) BY MOUTH AS NEEDED FOR LOW BLOOD SUGAR.  12  . cyanocobalamin (,VITAMIN B-12,) 1000 MCG/ML injection INJECT 1 ML INTRAMUSCULARLY EVERY 2 WEEKS 2 mL 12  . divalproex (DEPAKOTE ER) 500 MG 24 hr tablet TAKE 1 TABLET BY MOUTH AT BEDTIME FOR 3 DAYS, THEN 2 TABLETS AT BEDTIME  1  . glucose blood test strip Use as instructed 100 each 12  . HYDROcodone-ibuprofen (VICOPROFEN) 7.5-200 MG tablet 1 tablet every 10 hours PRN severe pain 30 tablet 0  . lamoTRIgine (LAMICTAL) 150 MG tablet Take 150 mg by mouth daily.  1  . lamoTRIgine (LAMICTAL) 200 MG tablet lamotrigine 200 mg tablet    . omeprazole (PRILOSEC) 20 MG capsule     . ondansetron (ZOFRAN) 4 MG tablet Take 1 tablet (  4 mg total) by mouth every 8 (eight) hours as needed for nausea or vomiting. 90 tablet 1  . promethazine (PHENERGAN) 25 MG tablet Take 1 tablet (25 mg total) by mouth every 8 (eight) hours as needed for nausea or vomiting. 20 tablet 3  . sertraline (ZOLOFT) 100 MG tablet Take 200 mg by mouth daily.    . SPRINTEC 28 0.25-35 MG-MCG tablet TAKE 1 TABLET BY MOUTH DAILY 28 tablet 12  . tiZANidine (ZANAFLEX) 4 MG capsule TAKE 1 CAPSULE (4 MG TOTAL) BY MOUTH 3 (THREE) TIMES DAILY AS NEEDED FOR MUSCLE SPASMS. 90 capsule 6  . tiZANidine (ZANAFLEX) 4 MG capsule TAKE 1 CAPSULE (4 MG TOTAL) BY MOUTH 3 (THREE) TIMES DAILY AS NEEDED FOR MUSCLE SPASMS. 90 capsule 0  . traMADol (ULTRAM) 50 MG tablet tramadol 50 mg tablet  TAKE 1 TABLET BY MOUTH EVERY 6 HOURS AS NEEDED FOR PAIN     No current  facility-administered medications for this visit.     Allergies as of 07/15/2017 - Review Complete 06/23/2017  Allergen Reaction Noted  . Depakote [divalproex sodium] Other (See Comments) 06/15/2015  . Acetaminophen Nausea Only 07/17/2011    ROS:  General: Negative for anorexia, weight loss, fever, chills, fatigue, weakness. ENT: Negative for hoarseness, difficulty swallowing , nasal congestion. CV: Negative for chest pain, angina, palpitations, dyspnea on exertion, peripheral edema.  Respiratory: Negative for dyspnea at rest, dyspnea on exertion, cough, sputum, wheezing.  GI: See history of present illness. GU:  Negative for dysuria, hematuria, urinary incontinence, urinary frequency, nocturnal urination.  Endo: Negative for unusual weight change.    Physical Examination:   BP 107/73 (BP Location: Left Arm, Patient Position: Sitting, Cuff Size: Large)   Pulse 86   Temp 98.7 F (37.1 C) (Oral)   Wt 204 lb (92.5 kg)   LMP 06/22/2017 (Exact Date)   BMI 37.31 kg/m   General: Well-nourished, well-developed in no acute distress.  Eyes: No icterus. Conjunctivae pink. Mouth: Oropharyngeal mucosa moist and pink , no lesions erythema or exudate. Lungs: Clear to auscultation bilaterally. Non-labored. Heart: Regular rate and rhythm, no murmurs rubs or gallops.  Abdomen: Bowel sounds are normal, nontender, nondistended, no hepatosplenomegaly or masses, no abdominal bruits or hernia , no rebound or guarding.   Extremities: No lower extremity edema. No clubbing or deformities. Neuro: Alert and oriented x 3.  Grossly intact. Skin: Warm and dry, no jaundice.   Psych: Alert and cooperative, normal mood and affect.   Imaging Studies: No results found.  Assessment and Plan:   Glendine D Czaplicki is a 41 y.o. y/o female here to follow up for chronic nausea and vomiting ongoing on and off since 2013. Differentials are acid/bile reflux. She is only on 20 mg of prilosec so will increase the dose  , can go upto 40 mg BID. She may also have gastroparesis from tramadol , suggest to limit use of it, when she goes out to eat , consume low fiber diet and foods low in fat as it can further delay gastric emptying, limit NSAID use. She may potentially suffer from cyclical vomiting syndrome and if none of these measures work we can try Coenzyme Q ,advised to call me based on plan below.    1. Avoid very fatty meals.  2. Small meals more often rather than large 3. Limit tramadol and ibuprofen usage.  4. Carafate QID PRN 5. Low fiber diet  6. Increase omeprazole 40 mg a day from 20 mg a day      Dr    MD,MRCP (U.K) Follow up in 6 months or sooner.   - 

## 2017-08-06 ENCOUNTER — Encounter: Payer: Self-pay | Admitting: Family Medicine

## 2017-08-06 ENCOUNTER — Ambulatory Visit (INDEPENDENT_AMBULATORY_CARE_PROVIDER_SITE_OTHER): Payer: 59 | Admitting: Family Medicine

## 2017-08-06 VITALS — BP 116/81 | HR 76 | Temp 98.9°F | Wt 203.1 lb

## 2017-08-06 DIAGNOSIS — N912 Amenorrhea, unspecified: Secondary | ICD-10-CM | POA: Diagnosis not present

## 2017-08-06 NOTE — Progress Notes (Signed)
BP 116/81 (BP Location: Left Arm, Patient Position: Sitting, Cuff Size: Large)   Pulse 76   Temp 98.9 F (37.2 C)   Wt 203 lb 2 oz (92.1 kg)   SpO2 99%   BMI 37.15 kg/m    Subjective:    Patient ID: Belinda [name], female    DOB: Oct 20, 1975, 41 y.o.   MRN: [id number]  HPI: Belinda [name] is a 41 y.o. female  Chief Complaint  Patient presents with  . Amenorrhea   ????PREGNANCY Gravida/Para: G2 Home pregnancy test: Hasn't checked  Nausea: yes- not more than usual Vomiting: yes- not more than usual Breast tenderness: no Abdominal pain: no Vaginal bleeding: no Last menstrual period- middle of September, feels different Contraception: pills  Relevant past medical, surgical, family and social history reviewed and updated as indicated. Interim medical history since our last visit reviewed. Allergies and medications reviewed and updated.  Review of Systems  Constitutional: Negative.   Respiratory: Negative.   Cardiovascular: Negative.   Gastrointestinal: Negative.   Psychiatric/Behavioral: Negative.     Per HPI unless specifically indicated above     Objective:    BP 116/81 (BP Location: Left Arm, Patient Position: Sitting, Cuff Size: Large)   Pulse 76   Temp 98.9 F (37.2 C)   Wt 203 lb 2 oz (92.1 kg)   SpO2 99%   BMI 37.15 kg/m   Wt Readings from Last 3 Encounters:  08/06/17 203 lb 2 oz (92.1 kg)  07/15/17 204 lb (92.5 kg)  06/23/17 208 lb 5 oz (94.5 kg)    Physical Exam  Constitutional: She is oriented to person, place, and time. She appears well-developed and well-nourished. No distress.  HENT:  Head: Normocephalic and atraumatic.  Right Ear: Hearing normal.  Left Ear: Hearing normal.  Nose: Nose normal.  Eyes: Conjunctivae and lids are normal. Right eye exhibits no discharge. Left eye exhibits no discharge. No scleral icterus.  Cardiovascular: Normal rate, regular rhythm, normal heart sounds and intact distal pulses.  Exam reveals no gallop and  no friction rub.   No murmur heard. Pulmonary/Chest: Effort normal and breath sounds normal. No respiratory distress. She has no wheezes. She has no rales. She exhibits no tenderness.  Musculoskeletal: Normal range of motion.  Neurological: She is alert and oriented to person, place, and time.  Skin: Skin is warm, dry and intact. No rash noted. She is not diaphoretic. No erythema. No pallor.  Psychiatric: She has a normal mood and affect. Her speech is normal and behavior is normal. Judgment and thought content normal. Cognition and memory are normal.  Nursing note and vitals reviewed.   Results for orders placed or performed in visit on 06/23/17  Comprehensive metabolic panel  Result Value Ref Range   Glucose 85 65 - 99 mg/dL   BUN 10 6 - 24 mg/dL   Creatinine, Ser 4.090.78 0.57 - 1.00 mg/dL   GFR calc non Af Amer 95 >59 mL/min/1.73   GFR calc Af Amer 109 >59 mL/min/1.73   BUN/Creatinine Ratio 13 9 - 23   Sodium 137 134 - 144 mmol/L   Potassium 4.5 3.5 - 5.2 mmol/L   Chloride 101 96 - 106 mmol/L   CO2 22 20 - 29 mmol/L   Calcium 8.8 8.7 - 10.2 mg/dL   Total Protein 6.5 6.0 - 8.5 g/dL   Albumin 3.9 3.5 - 5.5 g/dL   Globulin, Total 2.6 1.5 - 4.5 g/dL   Albumin/Globulin Ratio 1.5 1.2 - 2.2  Bilirubin Total 0.4 0.0 - 1.2 mg/dL   Alkaline Phosphatase 82 39 - 117 IU/L   AST 11 0 - 40 IU/L   ALT 11 0 - 32 IU/L  CBC with Differential/Platelet  Result Value Ref Range   WBC 8.7 3.4 - 10.8 x10E3/uL   RBC 4.12 3.77 - 5.28 x10E6/uL   Hemoglobin 11.7 11.1 - 15.9 g/dL   Hematocrit 29.5 62.1 - 46.6 %   MCV 88 79 - 97 fL   MCH 28.4 26.6 - 33.0 pg   MCHC 32.2 31.5 - 35.7 g/dL   RDW 30.8 65.7 - 84.6 %   Platelets 307 150 - 379 x10E3/uL   Neutrophils 62 Not Estab. %   Lymphs 30 Not Estab. %   Monocytes 6 Not Estab. %   Eos 2 Not Estab. %   Basos 0 Not Estab. %   Neutrophils Absolute 5.4 1.4 - 7.0 x10E3/uL   Lymphocytes Absolute 2.6 0.7 - 3.1 x10E3/uL   Monocytes Absolute 0.5 0.1 - 0.9  x10E3/uL   EOS (ABSOLUTE) 0.2 0.0 - 0.4 x10E3/uL   Basophils Absolute 0.0 0.0 - 0.2 x10E3/uL   Immature Granulocytes 0 Not Estab. %   Immature Grans (Abs) 0.0 0.0 - 0.1 x10E3/uL  Thyroid Panel With TSH  Result Value Ref Range   TSH 1.560 0.450 - 4.500 uIU/mL   T4, Total 8.3 4.5 - 12.0 ug/dL   T3 Uptake Ratio 24 24 - 39 %   Free Thyroxine Index 2.0 1.2 - 4.9      Assessment & Plan:   Problem List Items Addressed This Visit    None    Visit Diagnoses    Amenorrhea    -  Primary   Checking blood hcg today. Await results.    Relevant Orders   Beta HCG, Quant       Follow up plan: No Follow-up on file.

## 2017-08-07 ENCOUNTER — Telehealth: Payer: Self-pay | Admitting: Family Medicine

## 2017-08-07 LAB — BETA HCG QUANT (REF LAB): hCG Quant: <1 m[IU]/mL

## 2017-08-07 NOTE — Telephone Encounter (Signed)
Called to let Belinda [name] know that she is not pregnant.

## 2017-08-17 ENCOUNTER — Ambulatory Visit: Payer: 59 | Admitting: Gastroenterology

## 2017-08-20 ENCOUNTER — Telehealth: Payer: Self-pay | Admitting: Family Medicine

## 2017-08-20 NOTE — Telephone Encounter (Signed)
Spoke with patient. She is experiencing some confusing after starting Depakote and Cogentin, that was prescribed by psychology. Pt has left vm w/ Psychology.  Spoke to Dr. Laural BenesJohnson who advised that pt should contact psychology and can cut Depakote in half until she hears form them.

## 2017-08-20 NOTE — Telephone Encounter (Signed)
Message was relayed to patient.

## 2017-08-20 NOTE — Telephone Encounter (Signed)
-   As below

## 2017-08-20 NOTE — Telephone Encounter (Signed)
Attempting to call patient regarding some "confusion from medication she was taking". No answer, message left for patient to call back.

## 2017-08-20 NOTE — Telephone Encounter (Signed)
Spoke to [employer] at [facility] and requested nurse reach out to patient.

## 2017-08-21 ENCOUNTER — Encounter (HOSPITAL_COMMUNITY): Payer: Self-pay

## 2017-08-21 ENCOUNTER — Other Ambulatory Visit: Payer: Self-pay

## 2017-08-21 ENCOUNTER — Emergency Department (HOSPITAL_COMMUNITY)
Admission: EM | Admit: 2017-08-21 | Discharge: 2017-08-22 | Disposition: A | Discharge status: Home / Self Care | Payer: 59 | Attending: Emergency Medicine | Admitting: Emergency Medicine

## 2017-08-21 ENCOUNTER — Emergency Department (HOSPITAL_COMMUNITY): Payer: 59

## 2017-08-21 DIAGNOSIS — R410 Disorientation, unspecified: Secondary | ICD-10-CM

## 2017-08-21 DIAGNOSIS — Z794 Long term (current) use of insulin: Secondary | ICD-10-CM | POA: Insufficient documentation

## 2017-08-21 DIAGNOSIS — R42 Dizziness and giddiness: Secondary | ICD-10-CM | POA: Diagnosis present

## 2017-08-21 DIAGNOSIS — E119 Type 2 diabetes mellitus without complications: Secondary | ICD-10-CM | POA: Diagnosis not present

## 2017-08-21 DIAGNOSIS — Z79899 Other long term (current) drug therapy: Secondary | ICD-10-CM | POA: Insufficient documentation

## 2017-08-21 DIAGNOSIS — R2681 Unsteadiness on feet: Secondary | ICD-10-CM | POA: Diagnosis not present

## 2017-08-21 LAB — PROTIME-INR
INR: 1
Prothrombin Time: 13.1 seconds (ref 11.4–15.2)

## 2017-08-21 LAB — DIFFERENTIAL
BASOS ABS: 0 10*3/uL (ref 0.0–0.1)
BASOS PCT: 0 %
Eosinophils Absolute: 0.2 10*3/uL (ref 0.0–0.7)
Eosinophils Relative: 2 %
LYMPHS ABS: 2.2 10*3/uL (ref 0.7–4.0)
Lymphocytes Relative: 18 %
MONO ABS: 0.9 10*3/uL (ref 0.1–1.0)
MONOS PCT: 7 %
NEUTROS ABS: 8.6 10*3/uL — AB (ref 1.7–7.7)
Neutrophils Relative %: 73 %

## 2017-08-21 LAB — CBC
HEMATOCRIT: 38.7 % (ref 36.0–46.0)
HEMOGLOBIN: 12.4 g/dL (ref 12.0–15.0)
MCH: 28.8 pg (ref 26.0–34.0)
MCHC: 32 g/dL (ref 30.0–36.0)
MCV: 90 fL (ref 78.0–100.0)
Platelets: 240 10*3/uL (ref 150–400)
RBC: 4.3 MIL/uL (ref 3.87–5.11)
RDW: 14.4 % (ref 11.5–15.5)
WBC: 11.9 10*3/uL — AB (ref 4.0–10.5)

## 2017-08-21 LAB — I-STAT CHEM 8, ED
BUN: 8 mg/dL (ref 6–20)
CHLORIDE: 102 mmol/L (ref 101–111)
CREATININE: 0.7 mg/dL (ref 0.44–1.00)
Calcium, Ion: 1.08 mmol/L — ABNORMAL LOW (ref 1.15–1.40)
Glucose, Bld: 82 mg/dL (ref 65–99)
HEMATOCRIT: 39 % (ref 36.0–46.0)
HEMOGLOBIN: 13.3 g/dL (ref 12.0–15.0)
POTASSIUM: 4 mmol/L (ref 3.5–5.1)
Sodium: 138 mmol/L (ref 135–145)
TCO2: 25 mmol/L (ref 22–32)

## 2017-08-21 LAB — VALPROIC ACID LEVEL: VALPROIC ACID LVL: 36 ug/mL — AB (ref 50.0–100.0)

## 2017-08-21 LAB — COMPREHENSIVE METABOLIC PANEL
ALBUMIN: 3.7 g/dL (ref 3.5–5.0)
ALK PHOS: 72 U/L (ref 38–126)
ALT: 13 U/L — AB (ref 14–54)
AST: 16 U/L (ref 15–41)
Anion gap: 8 (ref 5–15)
BUN: 7 mg/dL (ref 6–20)
CALCIUM: 8.6 mg/dL — AB (ref 8.9–10.3)
CHLORIDE: 103 mmol/L (ref 101–111)
CO2: 25 mmol/L (ref 22–32)
CREATININE: 0.78 mg/dL (ref 0.44–1.00)
GFR calc non Af Amer: >60 mL/min (ref >60)
GLUCOSE: 84 mg/dL (ref 65–99)
Potassium: 4.2 mmol/L (ref 3.5–5.1)
SODIUM: 136 mmol/L (ref 135–145)
Total Bilirubin: 0.2 mg/dL — ABNORMAL LOW (ref 0.3–1.2)
Total Protein: 7.6 g/dL (ref 6.5–8.1)

## 2017-08-21 LAB — CBG MONITORING, ED: Glucose-Capillary: 159 mg/dL — ABNORMAL HIGH (ref 65–99)

## 2017-08-21 LAB — APTT: aPTT: 28 seconds (ref 24–36)

## 2017-08-21 LAB — I-STAT TROPONIN, ED: Troponin i, poc: 0.01 ng/mL (ref 0.00–0.08)

## 2017-08-21 IMAGING — CT CT HEAD W/O CM
4 series · 17 of 47 positions shown, 19 images · non-contrast
Comparison: 01/17/2016

CLINICAL DATA: Altered level of consciousness

EXAM:
CT HEAD WITHOUT CONTRAST
TECHNIQUE: Contiguous axial images were obtained from the base of the skull
through the vertex without intravenous contrast.

[Series 3: head without · axial · non-contrast · 0.40mm/px · z∈[-110,+10]mm · 7 of 34 slices shown, 9 images]
[im 5/34  brain]
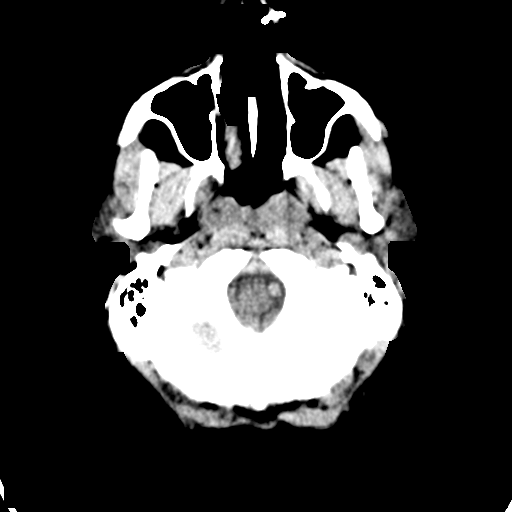
[im 5/34  bone]
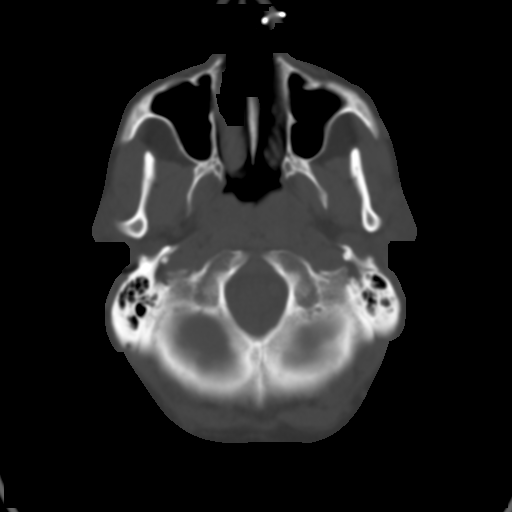
[im 9/34  brain]
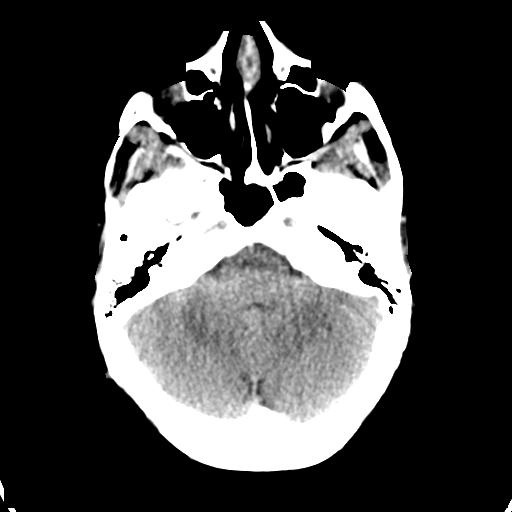
[im 13/34  brain]
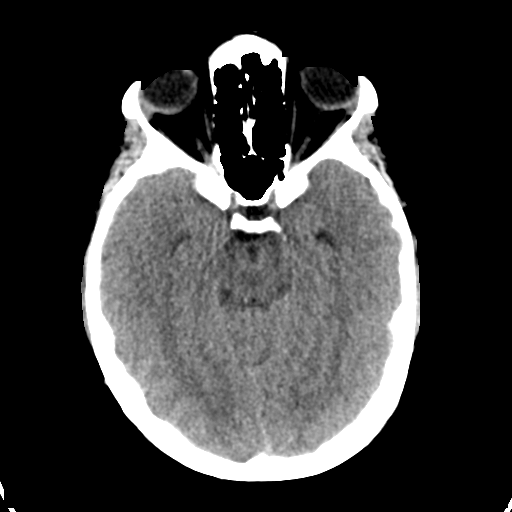
[im 17/34  brain]
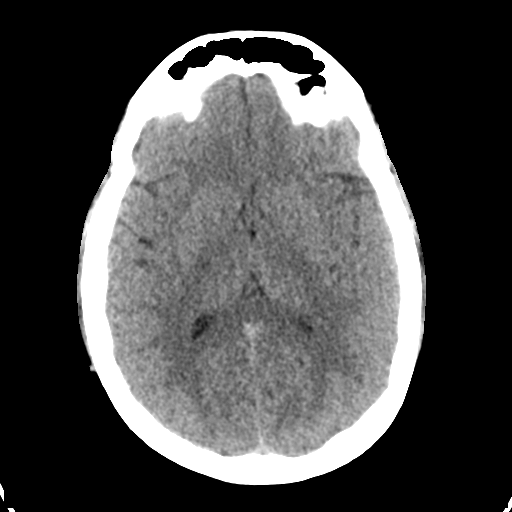
[im 21/34  brain]
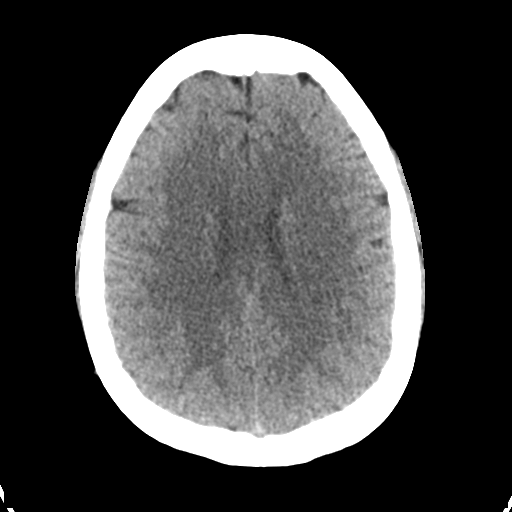
[im 21/34  bone]
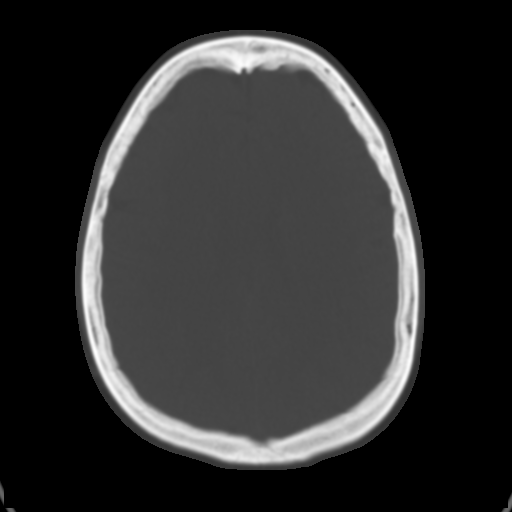
[im 25/34  brain]
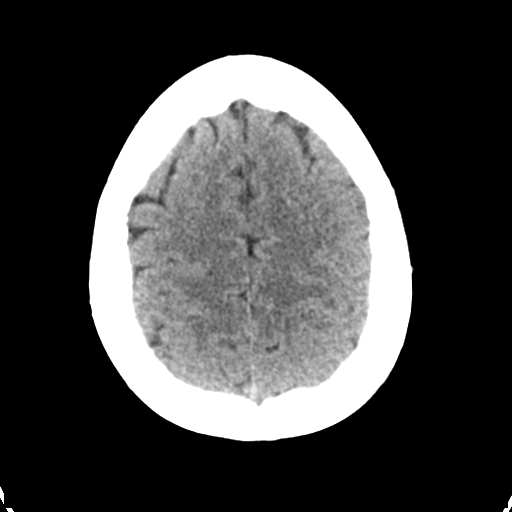
[im 29/34  brain]
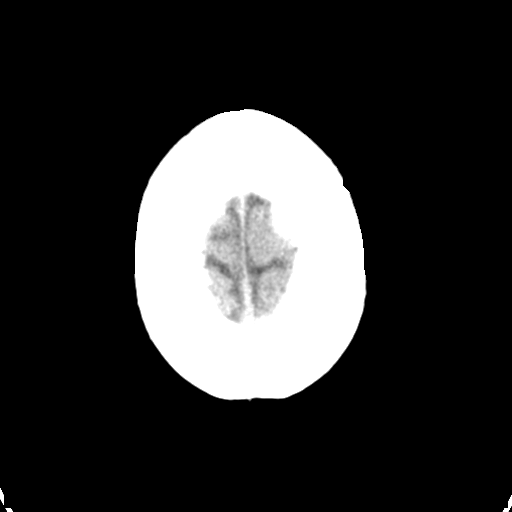

[Series 4: head bone · axial · 0.40mm/px · z∈[-114,-56]mm · 4 of 85 slices shown]
[im 9/85  bone]
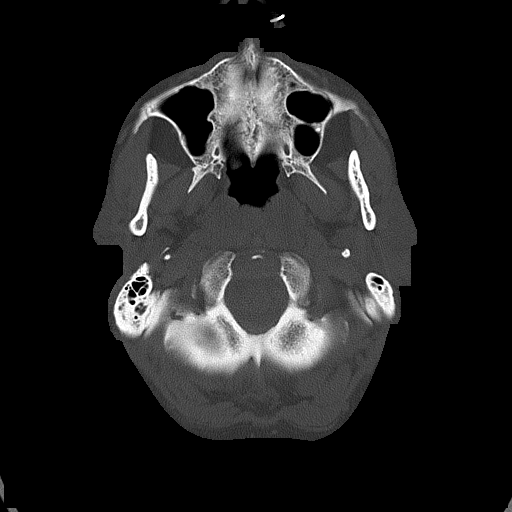
[im 17/85  bone]
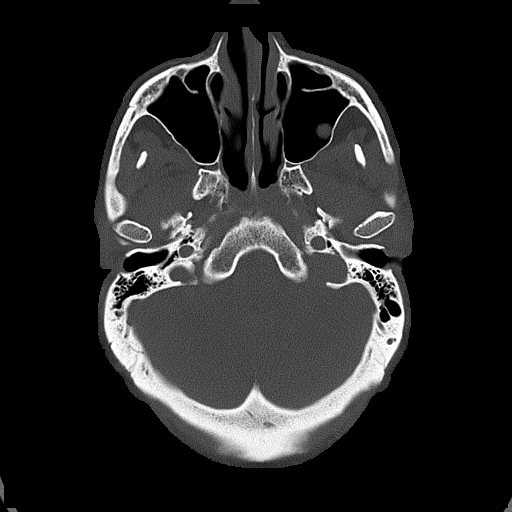
[im 26/85  bone]
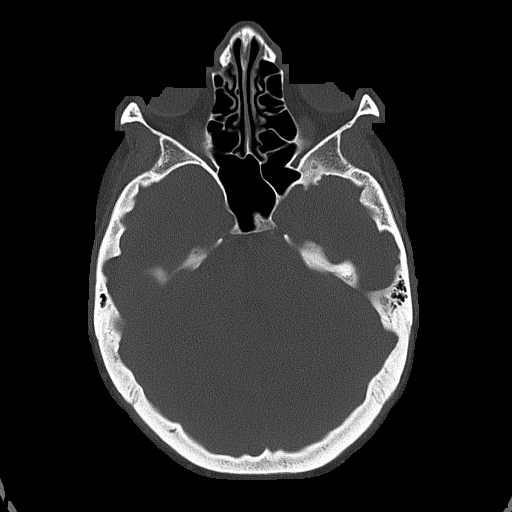
[im 38/85  bone]
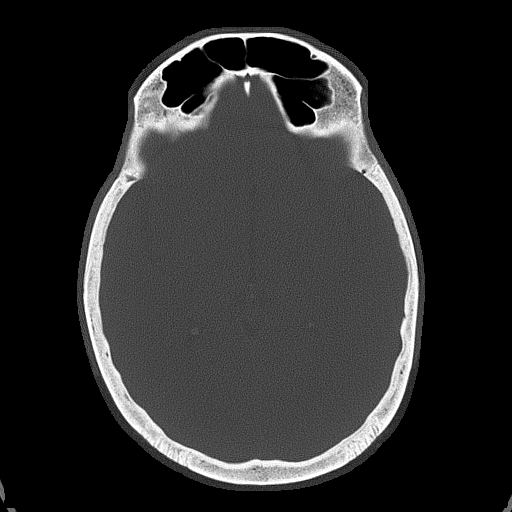

[Series 5: head without cor · coronal · non-contrast · 0.33mm/px · 3 of 67 slices shown]
[im 23/67  brain]
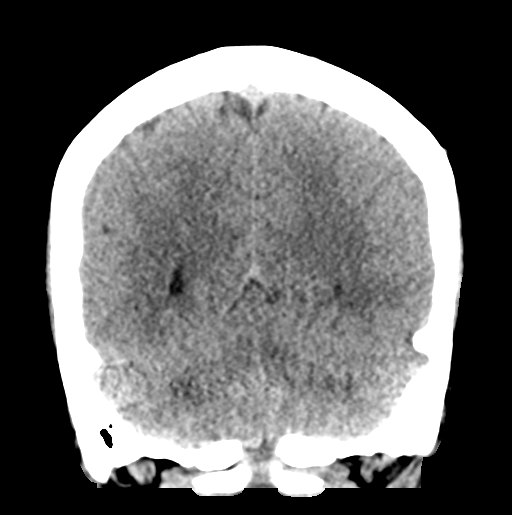
[im 30/67  brain]
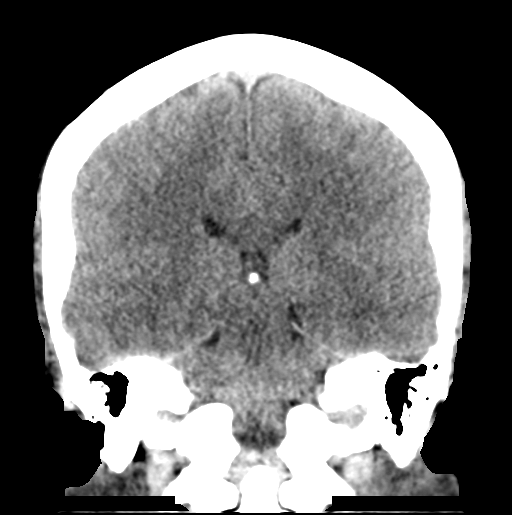
[im 37/67  brain]
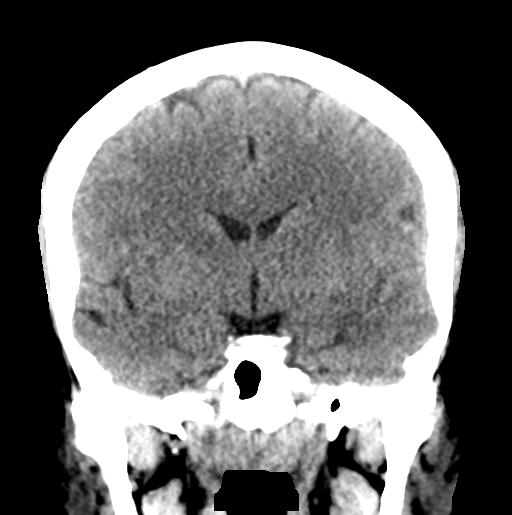

[Series 6: head without sag · sagittal · non-contrast · 0.33mm/px · 3 of 57 slices shown]
[im 19/57  brain]
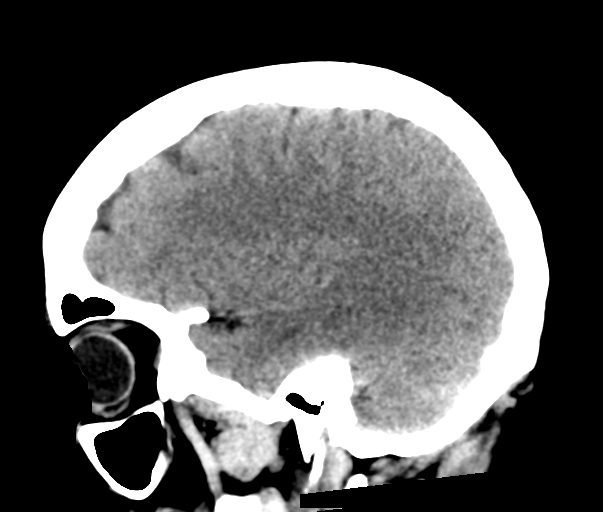
[im 29/57  brain]
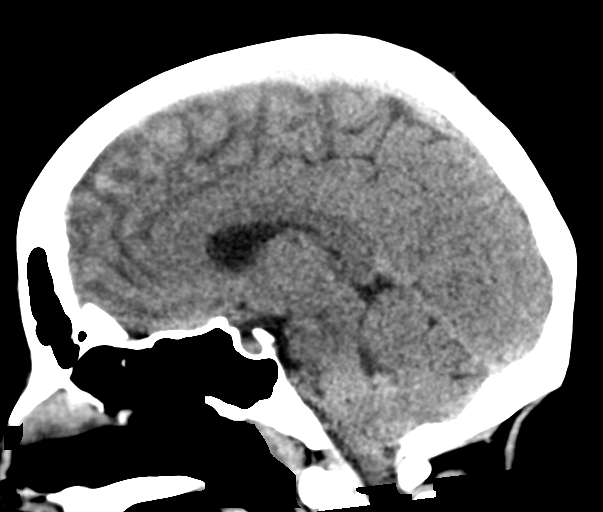
[im 38/57  brain]
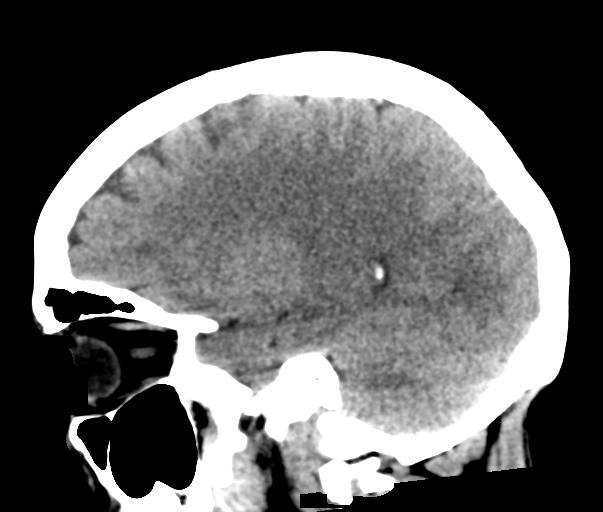

[17 of 47 positions shown; findings below may reference images not displayed]

FINDINGS: Brain: No evidence of acute infarction, hemorrhage, hydrocephalus,
extra-axial collection or mass lesion/mass effect.

Vascular: No hyperdense vessel or unexpected calcification.

Skull: Normal. Negative for fracture or focal lesion.

Sinuses/Orbits: Small mucous retention cysts in the left maxillary
sinus. No acute orbital abnormality

Other: None
IMPRESSION: No CT evidence for acute intracranial abnormality.

## 2017-08-21 NOTE — ED Provider Notes (Signed)
[facility] Provider Note   CSN: [id number] Arrival date & time: 08/21/17  1453     History   Chief Complaint Chief Complaint  Patient presents with  . Dizziness    HPI Belinda [name] is a 41 y.o. female.  The history is provided by the patient, a relative and medical records. No language interpreter was used.  Altered Mental Status   This is a new problem. The current episode started more than 2 days ago. The problem has not changed since onset.Associated symptoms include confusion. Pertinent negatives include no somnolence, no seizures, no unresponsiveness, no weakness, no agitation, no delusions, no hallucinations and no violence. Risk factors include a change in prescription (depakote and cogentin added 2 days ago). Her past medical history is significant for seizures. Her past medical history does not include CVA, COPD, dementia or head trauma.    Past Medical History:  Diagnosis Date  . Arthritis    lower back  . Bipolar affective disorder ([place])   . Deviated septum   . GERD (gastroesophageal reflux disease)   . Headache(784.0)    migraines - none 2 months  . Peptic ulcer   . Pre-diabetes   . Schizophrenia ([place])   . Sleep apnea   . Substance abuse [facility])     Patient Active Problem List   Diagnosis Date Noted  . 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 Nausea   . Arthropathy of lumbar facet joint 05/28/2016  . B12 deficiency 02/13/2016  . Neck pain 02/05/2016  . Pain of right arm 02/05/2016  . Right shoulder pain 02/05/2016  . Bariatric surgery status 02/23/2014  . Headache 02/23/2014  . Diabetes ([place]) 02/21/2014  . Morbid obesity ([place]) 10/14/2011    Past Surgical History:  Procedure Laterality Date  . ESOPHAGOGASTRODUODENOSCOPY (EGD) N/A 08/05/2012   Performed by Shann Medal, MD at [place]  . ESOPHAGOGASTRODUODENOSCOPY (EGD) WITH PROPOFOL N/A 11/07/2016   Performed by Jonathon Bellows, MD at [place]  .  ESOPHAGOGASTRODUODENOSCOPY (EGD) WITH PROPOFOL N/A 08/06/2016   Performed by Jonathon Bellows, MD at [place]  . LAPAROSCOPIC ROUX-EN-Y GASTRIC N/A 10/20/2011   Performed by Edward Jolly, MD at [facility]  . SEPTOPLASTY  2003    OB History    No data available       Home Medications    Prior to Admission medications   Medication Sig Start Date End Date Taking? Authorizing Provider  ARIPiprazole (ABILIFY) 30 MG tablet Take 30 mg by mouth at bedtime.  03/06/16   [provider]  B-D 3CC LUER-LOK SYR 25GX1" 25G X 1" 3 ML MISC  02/27/16   [provider]  benztropine (COGENTIN) 1 MG tablet TAKE 1 TABLET BY MOUTH EVERY DAY IN THE MORNING 07/08/17   [provider]  blood glucose meter kit and supplies KIT Dispense based on patient and insurance preference. Use up to four times daily as directed. (FOR ICD-9 250.00, 250.01). 06/26/15   Wynetta Emery, Megan P, DO  buPROPion (WELLBUTRIN XL) 150 MG 24 hr tablet Take 450 mg by mouth daily.     [provider]  celecoxib (CELEBREX) 200 MG capsule Take 1 capsule (200 mg total) by mouth 2 (two) times daily. 06/23/17   Valerie Roys, DO  clonazePAM (KLONOPIN) 0.5 MG tablet  09/07/16   [provider]  [facility] GLUCOSE 4-6 GM-MG CHEW chewable tablet CHEW 3 TABLETS (15 G TOTAL) BY MOUTH AS NEEDED FOR LOW BLOOD  SUGAR. 09/08/16   [provider]  cyanocobalamin (,VITAMIN B-12,) 1000 MCG/ML injection INJECT 1 ML INTRAMUSCULARLY EVERY 2 WEEKS 07/08/17   Johnson, Megan P, DO  divalproex (DEPAKOTE ER) 500 MG 24 hr tablet TAKE 1 TABLET BY MOUTH AT BEDTIME FOR 3 DAYS, THEN 2 TABLETS AT BEDTIME 07/08/17   [provider]  glucose blood test strip Use as instructed 09/08/16   Park Liter P, DO  HYDROcodone-ibuprofen (VICOPROFEN) 7.5-200 MG tablet 1 tablet every 10 hours PRN severe pain Patient not taking: Reported on 07/15/2017 06/23/17   Park Liter P, DO  lamoTRIgine (LAMICTAL) 150 MG tablet Take 150 mg  by mouth daily. 07/08/17   [provider]  omeprazole (PRILOSEC) 20 MG capsule  08/31/16   [provider]  ondansetron (ZOFRAN) 4 MG tablet Take 1 tablet (4 mg total) by mouth every 8 (eight) hours as needed for nausea or vomiting. 06/23/17   Johnson, Megan P, DO  promethazine (PHENERGAN) 25 MG tablet Take 1 tablet (25 mg total) by mouth every 8 (eight) hours as needed for nausea or vomiting. 06/23/17   Johnson, Megan P, DO  sertraline (ZOLOFT) 100 MG tablet Take 200 mg by mouth daily.    [provider]  SPRINTEC 28 0.25-35 MG-MCG tablet TAKE 1 TABLET BY MOUTH DAILY 03/03/17   Johnson, Megan P, DO  tiZANidine (ZANAFLEX) 4 MG capsule TAKE 1 CAPSULE (4 MG TOTAL) BY MOUTH 3 (THREE) TIMES DAILY AS NEEDED FOR MUSCLE SPASMS. Patient not taking: Reported on 07/15/2017 06/23/17   Park Liter P, DO  tiZANidine (ZANAFLEX) 4 MG capsule TAKE 1 CAPSULE (4 MG TOTAL) BY MOUTH 3 (THREE) TIMES DAILY AS NEEDED FOR MUSCLE SPASMS. Patient not taking: Reported on 07/15/2017 07/06/17   Park Liter P, DO  traMADol (ULTRAM) 50 MG tablet tramadol 50 mg tablet  TAKE 1 TABLET BY MOUTH EVERY 6 HOURS AS NEEDED FOR PAIN    [provider]    Family History Family History  Problem Relation Age of Onset  . Diabetes Mother   . Cancer Mother 31       brain, skin  . Cancer Father 54       kidney, bone, rectal  . Hyperlipidemia Father   . Hypertension Father   . Stroke Father   . Cancer Paternal Aunt        breast  . Diabetes Brother   . Allergies Sister     Social History Social History   Tobacco Use  . Smoking status: Never Smoker  . Smokeless tobacco: Never Used  Substance Use Topics  . Alcohol use: No  . Drug use: No     Allergies   Depakote [divalproex sodium] and Acetaminophen   Review of Systems Review of Systems  Constitutional: Negative for chills, diaphoresis and fatigue.  HENT: Negative for congestion and rhinorrhea.   Eyes: Negative for photophobia  and visual disturbance.  Respiratory: Negative for apnea, cough and shortness of breath.   Cardiovascular: Negative for chest pain, palpitations and leg swelling.  Gastrointestinal: Negative for abdominal pain.  Genitourinary: Negative for dysuria, flank pain and frequency.  Musculoskeletal: Negative for back pain, neck pain and neck stiffness.  Skin: Negative for rash and wound.  Neurological: Positive for light-headedness. Negative for dizziness, seizures, syncope, facial asymmetry, speech difficulty, weakness and headaches.  Psychiatric/Behavioral: Positive for confusion and decreased concentration. Negative for agitation, hallucinations and suicidal ideas.  All other systems reviewed and are negative.    Physical Exam Updated Vital Signs BP  124/77 (BP Location: Right Arm)   Pulse 100   Temp 99.2 F (37.3 C) (Oral)   Resp 12   LMP 08/15/2017 (Within Days)   SpO2 98%   Physical Exam  Constitutional: She is oriented to person, place, and time. She appears well-developed and well-nourished. No distress.  HENT:  Head: Normocephalic.  Mouth/Throat: Oropharynx is clear and moist. No oropharyngeal exudate.  Eyes: EOM are normal. Pupils are equal, round, and reactive to light.  Neck: Normal range of motion.  Cardiovascular: Normal rate and intact distal pulses.  No murmur heard. Pulmonary/Chest: Effort normal and breath sounds normal. No stridor. No respiratory distress. She has no wheezes. She has no rales.  Abdominal: Soft. Bowel sounds are normal. She exhibits no distension. There is no guarding.  Musculoskeletal: She exhibits no edema, tenderness or deformity.  Neurological: She is alert and oriented to person, place, and time. She is not disoriented. She displays tremor. She displays normal reflexes. No cranial nerve deficit or sensory deficit. She exhibits normal muscle tone. She displays no seizure activity. Coordination normal. GCS eye subscore is 4. GCS verbal subscore is 5.  GCS motor subscore is 6.  Mild resting tremor in bilateral hand negative Romberg.  Patient reported mild unsteadiness however she had normal gait on my evaluation.  Normal coordination otherwise.  Skin: Capillary refill takes less than 2 seconds. No rash noted. She is not diaphoretic.  Psychiatric: She has a normal mood and affect.  Nursing note and vitals reviewed.    ED Treatments / Results  Labs (all labs ordered are listed, but only abnormal results are displayed) Labs Reviewed  CBC - Abnormal; Notable for the following components:      Result Value   WBC 11.9 (*)    All other components within normal limits  DIFFERENTIAL - Abnormal; Notable for the following components:   Neutro Abs 8.6 (*)    All other components within normal limits  COMPREHENSIVE METABOLIC PANEL - Abnormal; Notable for the following components:   Calcium 8.6 (*)    ALT 13 (*)    Total Bilirubin 0.2 (*)    All other components within normal limits  VALPROIC ACID LEVEL - Abnormal; Notable for the following components:   Valproic Acid Lvl 36 (*)    All other components within normal limits  CBG MONITORING, ED - Abnormal; Notable for the following components:   Glucose-Capillary 159 (*)    All other components within normal limits  I-STAT CHEM 8, ED - Abnormal; Notable for the following components:   Calcium, Ion 1.08 (*)    All other components within normal limits  PROTIME-INR  APTT  I-STAT TROPONIN, ED    EKG  EKG Interpretation  Date/Time:  Friday August 21 2017 15:12:27 EST Ventricular Rate:  87 PR Interval:  126 QRS Duration: 82 QT Interval:  364 QTC Calculation: 438 R Axis:   19 Text Interpretation:  Normal sinus rhythm Normal ECG When compared to prior, no significant changes seen. similar S1Q3T3.   No STEMI Confirmed by Antony Blackbird [phone]) on 08/21/2017 10:40:02 PM       Radiology Ct Head Wo Contrast  Result Date: 08/21/2017 CLINICAL DATA:  Altered level of consciousness  EXAM: CT HEAD WITHOUT CONTRAST TECHNIQUE: Contiguous axial images were obtained from the base of the skull through the vertex without intravenous contrast. COMPARISON:  01/17/2016 FINDINGS: Brain: No evidence of acute infarction, hemorrhage, hydrocephalus, extra-axial collection or mass lesion/mass effect. Vascular: No hyperdense vessel or unexpected calcification. Skull:  Normal. Negative for fracture or focal lesion. Sinuses/Orbits: Small mucous retention cysts in the left maxillary sinus. No acute orbital abnormality Other: None IMPRESSION: No CT evidence for acute intracranial abnormality. Electronically Signed   By: Donavan Foil M.D.   On: 08/21/2017 18:10    Procedures Procedures (including critical care time)  Medications Ordered in ED Medications - No data to display   Initial Impression / Assessment and Plan / ED Course  I have reviewed the triage vital signs and the nursing notes.  Pertinent labs & imaging results that were available during my care of the patient were reviewed by me and considered in my medical decision making (see chart for details).     Belinda [name] is a 41 y.o. female with a past medical history significant for bipolar disorder, diabetes, and GERD who presents with confusion, memory loss, and altered mental status.  Patient reports that she has been on several medications for her bipolar disorder for the last 4 years however her psychiatrist recently began making changes.  She says that 2 weeks ago, she started Depakote.  She says that this caused her to have some bilateral hand tremors prompting them to add Cogentin 3 days ago.  She says that since starting the Cogentin, she has had several symptoms including memory loss intimately.  She says that she has gotten confused and has gotten disoriented and lost at times while trying to get to work.  She reports that she has had some unsteadiness and occasional gait abnormalities.  She says that she had several falls but  did not hit her head or have significant traumatic injuries.  Due to the mental status changes, she presented for evaluation.  Next  She denies any headache or neck stiffness.  She denies any fevers or chills.  No nausea, vomiting, chest pain, shortness of breath, palpitations, abdominal pain, urinary symptoms or GI symptoms.  On exam, patient's lungs are clear.  Chest is nontender.  Was able to stand with negative Romberg.  Patient had no significant gait abnormalities on my exam but she felt that she was unsteady.  Mild tremor in her hands but normal sensation, coordination, and strength.  Normal finger-nose-finger.  Exam otherwise unremarkable.  No evidence of trauma seen.  Patient had screening laboratory testing and imaging performed.  CT head showed no acute intracranial abnormality.  Laboratory testing was grossly reassuring.  Depakote level was obtained and was low.  Given the patient's symptoms beginning since onset of new medication changes, I suspect that these medication changes caused her symptoms.  Patient reports that she is currently on Abilify, Lamictal, Depakote, Zoloft, and Cogentin.  She reports that the Depakote and Cogentin were the recent additions.  Patient was instructed to discontinue them tomorrow and follow-up with her psychiatrist for discussions of medication changes.  Pharmacy was called and they confirmed that it is safe to just continue these medications, especially as Depakote was already subtherapeutic.  The patient reassessed and had no further neurologic abnormalities.  Do not feel patient needs MRI and do not feel this is infectious in nature.  Patient given strict return precautions for any new or worsened symptoms.  Patient voiced understanding of the plan of care and will follow up with her psychiatrist.  Patient had no other questions or concerns and was discharged in good condition.   Final Clinical Impressions(s) / ED Diagnoses   Final diagnoses:    Confusion  Unsteadiness     Clinical Impression: 1. Confusion  2. Unsteadiness     Disposition: Discharge  Condition: Good  I have discussed the results, Dx and Tx plan with the pt(& family if present). He/she/they expressed understanding and agree(s) with the plan. Discharge instructions discussed at great length. Strict return precautions discussed and pt &/or family have verbalized understanding of the instructions. No further questions at time of discharge.    This SmartLink is deprecated. Use AVSMEDLIST instead to display the medication list for a patient.  Follow Up: Valerie Roys, DO [place] [address] [phone]     [facility] EMERGENCY DEPARTMENT [address] [phone] [place] [phone]  If symptoms worsen     Tegeler, Gwenyth Allegra, MD 08/22/17 [phone]

## 2017-08-21 NOTE — ED Triage Notes (Signed)
Pt reports that she has had dizziness and confusion that began on Tuesday. She reports that she takes several psych meds and her MD changed her dose but has had no relief. Pt is alert and oriented X4. No neuro deficits noted.

## 2017-08-22 NOTE — Discharge Instructions (Signed)
Your workup today was overall reassuring.  We did not find evidence of intracranial abnormality or injury from her falls.  We suspect your symptoms are due to the combination of medications were taking.  As you have demonstrated several years of irrigation use without problems until the edition of the Depakote and Cogentin, we are recommending to discontinue them and speaking with your psychiatry team in the next several days.  If any symptoms change, return, or worsen, please return to the nearest emergency department.

## 2017-08-24 ENCOUNTER — Ambulatory Visit: Payer: 59 | Admitting: Family Medicine

## 2017-08-24 ENCOUNTER — Encounter: Payer: Self-pay | Admitting: Family Medicine

## 2017-08-24 VITALS — BP 113/78 | HR 85 | Temp 98.1°F | Resp 16 | Ht 62.0 in | Wt 200.0 lb

## 2017-08-24 DIAGNOSIS — F3162 Bipolar disorder, current episode mixed, moderate: Secondary | ICD-10-CM | POA: Diagnosis not present

## 2017-08-24 DIAGNOSIS — R410 Disorientation, unspecified: Secondary | ICD-10-CM

## 2017-08-24 NOTE — Progress Notes (Signed)
BP 113/78   Pulse 85   Temp 98.1 F (36.7 C) (Oral)   Resp 16   Ht 5\' 2"  (1.575 m)   Wt 200 lb (90.7 kg)   LMP 08/15/2017 (Within Days)   SpO2 98%   BMI 36.58 kg/m    Subjective:    Patient ID: Belinda [name], female    DOB: 1976-05-18, 41 y.o.   MRN: [id number]  HPI: Belinda [name] is a 41 y.o. female  Chief Complaint  Patient presents with  . Hospitalization Follow-up    hospital diagnosed with interaction with 2 bipolar meds but pt still has same Sxs mental confusion and feels like dizzy with ROM onset today   Patient presents for ER f/u for altered mental status and generalized weakness/dizzinss that started over the past 2 weeks since starting cogentin and depakote through Psychiatry. ER workup fairly benign, suspicion is these were related to the new medications given time frame. Modest improvement since she stopped the depakote and cogentin since d/c from the hospital. Has not yet been able to follow up with Psychiatry. Denies fevers, chills, abdominal pain, CP, urinary sxs.   Relevant past medical, surgical, family and social history reviewed and updated as indicated. Interim medical history since our last visit reviewed. Allergies and medications reviewed and updated.  Review of Systems  Constitutional: Positive for fatigue.  HENT: Negative.   Eyes: Negative.   Respiratory: Negative.   Cardiovascular: Negative.   Gastrointestinal: Negative.   Genitourinary: Negative.   Musculoskeletal: Negative.   Skin: Negative.   Neurological: Positive for dizziness and weakness.  Psychiatric/Behavioral: Positive for confusion.    Per HPI unless specifically indicated above     Objective:    BP 113/78   Pulse 85   Temp 98.1 F (36.7 C) (Oral)   Resp 16   Ht 5\' 2"  (1.575 m)   Wt 200 lb (90.7 kg)   LMP 08/15/2017 (Within Days)   SpO2 98%   BMI 36.58 kg/m   Wt Readings from Last 3 Encounters:  08/24/17 200 lb (90.7 kg)  08/06/17 203 lb 2 oz (92.1 kg)    07/15/17 204 lb (92.5 kg)    Physical Exam  Constitutional: She is oriented to person, place, and time. She appears well-developed and well-nourished. No distress.  HENT:  Head: Atraumatic.  Eyes: Conjunctivae are normal. Pupils are equal, round, and reactive to light.  Neck: Normal range of motion. Neck supple.  Cardiovascular: Normal rate and normal heart sounds.  Pulmonary/Chest: Effort normal and breath sounds normal. No respiratory distress.  Musculoskeletal: Normal range of motion.  Neurological: She is alert and oriented to person, place, and time.  Skin: Skin is warm and dry.  Psychiatric: She has a normal mood and affect. Her behavior is normal.  Nursing note and vitals reviewed.   Results for orders placed or performed in visit on 08/24/17  Microscopic Examination  Result Value Ref Range   WBC, UA 0-5 0 - 5 /hpf   RBC, UA 0-2 0 - 2 /hpf   Epithelial Cells (non renal) CANCELED    Crystals Present N/A   Crystal Type Uric Acid N/A   Bacteria, UA Few None seen/Few  UA/M w/rflx Culture, Routine  Result Value Ref Range   Specific Gravity, UA 1.020 1.005 - 1.030   pH, UA 6.5 5.0 - 7.5   Color, UA Yellow Yellow   Appearance Ur Turbid (A) Clear   Leukocytes, UA Trace (A) Negative   Protein, UA Negative  Negative/Trace   Glucose, UA Negative Negative   Ketones, UA Trace (A) Negative   RBC, UA Trace (A) Negative   Bilirubin, UA Negative Negative   Urobilinogen, Ur 1.0 0.2 - 1.0 mg/dL   Nitrite, UA Negative Negative   Microscopic Examination See below:       Assessment & Plan:   Problem List Items Addressed This Visit      Other   Bipolar affective disorder (HCC)    Given benign work-up for other causes of her altered mental status as well as timing with addition of new medications, agree that her sxs are likely medication related. Continue d/c of depakote and cogentin and f/u with Psychiatry ASAP. Return precautions reviewed       Other Visit Diagnoses     Confusion    -  Primary   Afebrile, U/A without evidence of UTI, recent labs stable. Suspect medication reaction.    Relevant Orders   UA/M w/rflx Culture, Routine (Completed)       Follow up plan: Return for psychiatry.

## 2017-08-25 LAB — UA/M W/RFLX CULTURE, ROUTINE
Bilirubin, UA: NEGATIVE
GLUCOSE, UA: NEGATIVE
NITRITE UA: NEGATIVE
PROTEIN UA: NEGATIVE
SPEC GRAV UA: 1.02 (ref 1.005–1.030)
Urobilinogen, Ur: 1 mg/dL (ref 0.2–1.0)
pH, UA: 6.5 (ref 5.0–7.5)

## 2017-08-25 LAB — MICROSCOPIC EXAMINATION

## 2017-08-27 DIAGNOSIS — F319 Bipolar disorder, unspecified: Secondary | ICD-10-CM | POA: Insufficient documentation

## 2017-08-27 NOTE — Patient Instructions (Signed)
Follow up as needed

## 2017-08-27 NOTE — Assessment & Plan Note (Signed)
Given benign work-up for other causes of her altered mental status as well as timing with addition of new medications, agree that her sxs are likely medication related. Continue d/c of depakote and cogentin and f/u with Psychiatry ASAP. Return precautions reviewed

## 2017-08-31 ENCOUNTER — Ambulatory Visit: Payer: 59 | Admitting: Gastroenterology

## 2017-08-31 ENCOUNTER — Encounter: Payer: Self-pay | Admitting: *Deleted

## 2017-09-07 ENCOUNTER — Encounter: Payer: Self-pay | Admitting: Family Medicine

## 2017-09-22 ENCOUNTER — Ambulatory Visit: Payer: 59 | Admitting: Family Medicine

## 2017-09-24 ENCOUNTER — Telehealth: Payer: Self-pay | Admitting: Family Medicine

## 2017-09-24 NOTE — Telephone Encounter (Signed)
Copied from CRM [id number]. Topic: Quick Communication - See Telephone Encounter >> Sep 24, 2017  4:09 PM Rudi CocoLathan, Syenna Nazir M, NT wrote: CRM for notification. See Telephone encounter for:   09/24/17. Dr. Maryruth BunKapur office called to get last office visit and progress notes from pt. Last visit with Dr. Laural BenesJohnson. Pt. Has an appt. With Dr. Maryruth BunKapur on Monday 12-24 and the office would like to have info. Sent asap. Fax number [phone]

## 2017-09-24 NOTE — Telephone Encounter (Signed)
Notes faxed.

## 2017-10-11 ENCOUNTER — Other Ambulatory Visit: Payer: Self-pay | Admitting: Family Medicine

## 2017-10-11 ENCOUNTER — Encounter: Payer: Self-pay | Admitting: Family Medicine

## 2017-10-12 MED ORDER — TIZANIDINE HCL 4 MG PO CAPS: 4.0000 mg | ORAL_CAPSULE | Freq: Three times a day (TID) | ORAL | 0 refills | Status: DC | PRN

## 2017-10-12 NOTE — Telephone Encounter (Signed)
Routing to provider  

## 2017-10-14 ENCOUNTER — Ambulatory Visit: Payer: 59 | Admitting: Family Medicine

## 2017-10-14 VITALS — BP 116/75 | HR 76 | Temp 98.0°F | Wt 201.6 lb

## 2017-10-14 DIAGNOSIS — Z79899 Other long term (current) drug therapy: Secondary | ICD-10-CM | POA: Diagnosis not present

## 2017-10-14 DIAGNOSIS — J069 Acute upper respiratory infection, unspecified: Secondary | ICD-10-CM | POA: Diagnosis not present

## 2017-10-14 DIAGNOSIS — S39012A Strain of muscle, fascia and tendon of lower back, initial encounter: Secondary | ICD-10-CM | POA: Diagnosis not present

## 2017-10-14 DIAGNOSIS — M47816 Spondylosis without myelopathy or radiculopathy, lumbar region: Secondary | ICD-10-CM | POA: Diagnosis not present

## 2017-10-14 MED ORDER — TRAMADOL HCL 50 MG PO TABS: ORAL_TABLET | ORAL | 0 refills | Status: DC

## 2017-10-14 MED ORDER — LIDOCAINE 5 % EX PTCH: 1.0000 | MEDICATED_PATCH | CUTANEOUS | 0 refills | Status: AC

## 2017-10-14 NOTE — Progress Notes (Signed)
BP 116/75 (BP Location: Right Arm, Patient Position: Sitting)   Pulse 76   Temp 98 F (36.7 C) (Oral)   Wt 201 lb 9.6 oz (91.4 kg)   BMI 36.87 kg/m    Subjective:    Patient ID: Belinda [name], female    DOB: [birth year], 42 y.o.   MRN: [id number]  HPI: Belinda [name] is a 42 y.o. female  Chief Complaint  Patient presents with  . Sore Throat  . Back Pain    refill pain   Pt here today for a refill on her medication. Taking tramadol for back pain about 2x/week. States it's helping quite a bit. Pulled her back the other day at work picking up a child. Using doterra rubs and heating pads with minimal relief during the day. Denies current leg weakness or numbness. Denies side effects with tramadol.   1 day of sore throat, congestion, chest tightness. Taking dayquil and zyrtec with minimal relief. Denies hx of pulmonary dz. Works at [employer], lots of sick contacts.   Past Medical History:  Diagnosis Date  . Arthritis    lower back  . Bipolar affective disorder (HCC)   . Deviated septum   . GERD (gastroesophageal reflux disease)   . Headache(784.0)    migraines - none 2 months  . Peptic ulcer   . Pre-diabetes   . Schizophrenia (HCC)   . Sleep apnea   . Substance abuse (HCC)    Social History   Socioeconomic History  . Marital status: Married    Spouse name: Not on file  . Number of children: Not on file  . Years of education: Not on file  . Highest education level: Not on file  Social Needs  . Financial resource strain: Not on file  . Food insecurity - worry: Not on file  . Food insecurity - inability: Not on file  . Transportation needs - medical: Not on file  . Transportation needs - non-medical: Not on file  Occupational History  . Not on file  Tobacco Use  . Smoking status: Never Smoker  . Smokeless tobacco: Never Used  Substance and Sexual Activity  . Alcohol use: No  . Drug use: No  . Sexual activity: Yes    Birth control/protection: None  Other  Topics Concern  . Not on file  Social History Narrative  . Not on file   Relevant past medical, surgical, family and social history reviewed and updated as indicated. Interim medical history since our last visit reviewed. Allergies and medications reviewed and updated.  Review of Systems  Constitutional: Negative.   HENT: Positive for rhinorrhea and sore throat.   Respiratory: Positive for cough and chest tightness.   Cardiovascular: Negative.   Gastrointestinal: Negative.   Genitourinary: Negative.   Musculoskeletal: Positive for back pain.  Neurological: Negative.   Psychiatric/Behavioral: Negative.    Per HPI unless specifically indicated above     Objective:    BP 116/75 (BP Location: Right Arm, Patient Position: Sitting)   Pulse 76   Temp 98 F (36.7 C) (Oral)   Wt 201 lb 9.6 oz (91.4 kg)   BMI 36.87 kg/m   Wt Readings from Last 3 Encounters:  10/14/17 201 lb 9.6 oz (91.4 kg)  08/24/17 200 lb (90.7 kg)  08/06/17 203 lb 2 oz (92.1 kg)    Physical Exam  Constitutional: She appears well-developed and well-nourished. No distress.  HENT:  Head: Atraumatic.  Eyes: Conjunctivae are normal. No scleral icterus.  Neck: Normal range of motion. Neck supple.  Cardiovascular: Normal rate and normal heart sounds.  Pulmonary/Chest: Effort normal and breath sounds normal. No respiratory distress.  Musculoskeletal: She exhibits tenderness (ttp lumbar paraspinal muscles).  Lymphadenopathy:    She has no cervical adenopathy.  Neurological: She is alert. She exhibits normal muscle tone. Coordination normal.  Skin: Skin is warm and dry.  Psychiatric: She has a normal mood and affect. Her behavior is normal.  Nursing note and vitals reviewed.   Results for orders placed or performed in visit on 10/14/17  Urine drugs of abuse scrn w alc, routine (Ref Lab)  Result Value Ref Range   Amphetamines, Urine Negative Cutoff=1000 ng/mL   Barbiturate Quant, Ur Negative Cutoff=300 ng/mL    Benzodiazepine Quant, Ur Negative Cutoff=300 ng/mL   Cannabinoid Quant, Ur Negative Cutoff=50 ng/mL   Cocaine (Metab.) Negative Cutoff=300 ng/mL   Opiate Quant, Ur Negative Cutoff=300 ng/mL   PCP Quant, Ur Negative Cutoff=25 ng/mL   Methadone Screen, Urine Negative Cutoff=300 ng/mL   Propoxyphene Negative Cutoff=300 ng/mL   Ethanol, Urine Negative Cutoff=0.020 %      Assessment & Plan:   Problem List Items Addressed This Visit      Musculoskeletal and Integument   Arthritis of lumbar spine - Primary    Stable on tramadol about 2 times per week. 30 pills should last her about 3 months. Controlled substance agreement signed, UDS done. F/u in 3 months      Relevant Medications   traMADol (ULTRAM) 50 MG tablet     Other   Controlled substance agreement signed   Relevant Orders   Urine drugs of abuse scrn w alc, routine (Ref Lab) (Completed)    Other Visit Diagnoses    Viral URI       Discussed OTC remedies such as mucinex, dayquil, nyquil for relief, supportive care. Pt to call if no improvement or worsening sxs.    Strain of lumbar region, initial encounter       Rest, massage, epsom soaks, lidocaine patches, and zanaflex prn. F/u if no improvement       Follow up plan: Return in about 3 months (around 01/12/2018) for back pain f/u.

## 2017-10-15 LAB — URINE DRUGS OF ABUSE SCREEN W ALC, ROUTINE (REF LAB)
AMPHETAMINES, URINE: NEGATIVE ng/mL
BENZODIAZEPINE QUANT UR: NEGATIVE ng/mL
Barbiturate Quant, Ur: NEGATIVE ng/mL
Cannabinoid Quant, Ur: NEGATIVE ng/mL
Cocaine (Metab.): NEGATIVE ng/mL
Ethanol, Urine: NEGATIVE %
METHADONE SCREEN, URINE: NEGATIVE ng/mL
OPIATE QUANT UR: NEGATIVE ng/mL
PCP QUANT UR: NEGATIVE ng/mL
Propoxyphene: NEGATIVE ng/mL

## 2017-10-16 ENCOUNTER — Encounter: Payer: Self-pay | Admitting: Family Medicine

## 2017-10-16 DIAGNOSIS — Z79899 Other long term (current) drug therapy: Secondary | ICD-10-CM | POA: Insufficient documentation

## 2017-10-16 HISTORY — DX: Other long term (current) drug therapy: Z79.899

## 2017-10-16 NOTE — Assessment & Plan Note (Signed)
Stable on tramadol about 2 times per week. 30 pills should last her about 3 months. Controlled substance agreement signed, UDS done. F/u in 3 months

## 2017-10-16 NOTE — Patient Instructions (Signed)
Follow up in 3 months

## 2017-10-27 ENCOUNTER — Other Ambulatory Visit: Payer: Self-pay | Admitting: Family Medicine

## 2017-11-30 ENCOUNTER — Ambulatory Visit: Payer: Self-pay | Admitting: *Deleted

## 2017-11-30 ENCOUNTER — Other Ambulatory Visit: Payer: Self-pay

## 2017-11-30 ENCOUNTER — Encounter: Payer: Self-pay | Admitting: Emergency Medicine

## 2017-11-30 ENCOUNTER — Emergency Department
Admission: EM | Admit: 2017-11-30 | Discharge: 2017-11-30 | Disposition: A | Discharge status: Home / Self Care | Payer: 59 | Attending: Student in an Organized Health Care Education/Training Program | Admitting: Student in an Organized Health Care Education/Training Program

## 2017-11-30 DIAGNOSIS — R002 Palpitations: Secondary | ICD-10-CM | POA: Insufficient documentation

## 2017-11-30 DIAGNOSIS — Z79899 Other long term (current) drug therapy: Secondary | ICD-10-CM | POA: Diagnosis not present

## 2017-11-30 LAB — CBC
HCT: 37.1 % (ref 35.0–47.0)
Hemoglobin: 12.2 g/dL (ref 12.0–16.0)
MCH: 28.5 pg (ref 26.0–34.0)
MCHC: 33 g/dL (ref 32.0–36.0)
MCV: 86.2 fL (ref 80.0–100.0)
Platelets: 282 10*3/uL (ref 150–440)
RBC: 4.3 MIL/uL (ref 3.80–5.20)
RDW: 13.6 % (ref 11.5–14.5)
WBC: 9 10*3/uL (ref 3.6–11.0)

## 2017-11-30 LAB — COMPREHENSIVE METABOLIC PANEL
ALT: 13 U/L — ABNORMAL LOW (ref 14–54)
AST: 16 U/L (ref 15–41)
Albumin: 3.7 g/dL (ref 3.5–5.0)
Alkaline Phosphatase: 72 U/L (ref 38–126)
Anion gap: 5 (ref 5–15)
BUN: 8 mg/dL (ref 6–20)
CHLORIDE: 105 mmol/L (ref 101–111)
CO2: 25 mmol/L (ref 22–32)
CREATININE: 0.75 mg/dL (ref 0.44–1.00)
Calcium: 8.5 mg/dL — ABNORMAL LOW (ref 8.9–10.3)
Glucose, Bld: 96 mg/dL (ref 65–99)
POTASSIUM: 4 mmol/L (ref 3.5–5.1)
Sodium: 135 mmol/L (ref 135–145)
Total Bilirubin: 0.4 mg/dL (ref 0.3–1.2)
Total Protein: 7 g/dL (ref 6.5–8.1)

## 2017-11-30 LAB — URINALYSIS, COMPLETE (UACMP) WITH MICROSCOPIC
BILIRUBIN URINE: NEGATIVE
Glucose, UA: NEGATIVE mg/dL
HGB URINE DIPSTICK: NEGATIVE
Ketones, ur: NEGATIVE mg/dL
Leukocytes, UA: NEGATIVE
Nitrite: NEGATIVE
Protein, ur: NEGATIVE mg/dL
SPECIFIC GRAVITY, URINE: 1.008 (ref 1.005–1.030)
pH: 7 (ref 5.0–8.0)

## 2017-11-30 LAB — TSH: TSH: 1.526 u[IU]/mL (ref 0.350–4.500)

## 2017-11-30 LAB — POC URINE PREG, ED: PREG TEST UR: NEGATIVE

## 2017-11-30 LAB — TROPONIN I

## 2017-11-30 MED ORDER — SODIUM CHLORIDE 0.9 % IV BOLUS (SEPSIS)
1000.0000 mL | Freq: Once | INTRAVENOUS | Status: AC
  Administered 2017-11-30: 1000 mL via INTRAVENOUS

## 2017-11-30 MED ORDER — PROMETHAZINE HCL 25 MG/ML IJ SOLN
12.5000 mg | Freq: Four times a day (QID) | INTRAMUSCULAR | Status: DC | PRN
  Administered 2017-11-30: 12.5 mg via INTRAVENOUS
  Filled 2017-11-30: qty 1

## 2017-11-30 MED ORDER — PROMETHAZINE HCL 12.5 MG PO TABS: 12.5000 mg | ORAL_TABLET | Freq: Four times a day (QID) | ORAL | 0 refills | Status: DC | PRN

## 2017-11-30 NOTE — ED Provider Notes (Signed)
[facility] Emergency Department Provider Note    None    (approximate)  I have reviewed the triage vital signs and the nursing notes.   HISTORY  Chief Complaint Palpitations    HPI Belinda [name] is a 42 y.o. female who presents with chief complaint of several episodes of palpitations over the past 2 days.  States this happened roughly 3 times lasting each time roughly 10 minutes.  No associated shortness of breath or pain.  Just feels that her heart is fluttering.  She thought that it was related to a panic attack so she took some Klonopin which she has prescribed without any improvement.  States that she fell over the weekend that she was getting sick with flulike illness she has had several exposures to people that are flu positive.  Denies any fevers.  Has felt nauseated no vomiting.  No chest pain shortness of breath nausea or vomiting while in the ER today.  No personal history of heart disease.  She is currently off of her bipolar medications but states that she feels that her symptoms are well controlled and she has follow-up with psychiatry next month.  Past Medical History:  Diagnosis Date  . Arthritis    lower back  . Bipolar affective disorder ([place])   . Deviated septum   . GERD (gastroesophageal reflux disease)   . Headache(784.0)    migraines - none 2 months  . Peptic ulcer   . Pre-diabetes   . Schizophrenia ([place])   . Sleep apnea   . Substance abuse ([place])    Family History  Problem Relation Age of Onset  . Diabetes Mother   . Cancer Mother 55       brain, skin  . Cancer Father 68       kidney, bone, rectal  . Hyperlipidemia Father   . Hypertension Father   . Stroke Father   . Cancer Paternal Aunt        breast  . Diabetes Brother   . Allergies Sister    Past Surgical History:  Procedure Laterality Date  . ESOPHAGOGASTRODUODENOSCOPY  08/05/2012   Procedure: ESOPHAGOGASTRODUODENOSCOPY (EGD);  Surgeon: Shann Medal, MD;   Location: [facility];  Service: General;  Laterality: N/A;  . ESOPHAGOGASTRODUODENOSCOPY (EGD) WITH PROPOFOL N/A 08/06/2016   Procedure: ESOPHAGOGASTRODUODENOSCOPY (EGD) WITH PROPOFOL;  Surgeon: Jonathon Bellows, MD;  Location: [facility];  Service: Endoscopy;  Laterality: N/A;  . ESOPHAGOGASTRODUODENOSCOPY (EGD) WITH PROPOFOL N/A 11/07/2016   Procedure: ESOPHAGOGASTRODUODENOSCOPY (EGD) WITH PROPOFOL;  Surgeon: Jonathon Bellows, MD;  Location: [facility];  Service: Endoscopy;  Laterality: N/A;  . 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 SEPTOPLASTY  2003   Patient Active Problem List   Diagnosis Date Noted  . Controlled substance agreement signed 10/16/2017  . Bipolar affective disorder ([place]) 08/27/2017  . 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 Nausea   . Arthropathy of lumbar facet joint 05/28/2016  . B12 deficiency 02/13/2016  . Neck pain 02/05/2016  . Pain of right arm 02/05/2016  . Right shoulder pain 02/05/2016  . Bariatric surgery status 02/23/2014  . Headache 02/23/2014  . Diabetes ([place]) 02/21/2014  . Morbid obesity ([place]) 10/14/2011      Prior to Admission medications   Medication Sig Start Date End Date Taking? Authorizing Provider  ARIPiprazole (ABILIFY) 30 MG tablet Take  30 mg by mouth at bedtime.  03/06/16   [provider]  B-D 3CC LUER-LOK SYR 25GX1" 25G X 1" 3 ML MISC  02/27/16   [provider]  benztropine (COGENTIN) 1 MG tablet TAKE 1.5 MG BY MOUTH EVERY DAY IN THE MORNING 07/08/17   [provider]  blood glucose meter kit and supplies KIT Dispense based on patient and insurance preference. Use up to four times daily as directed. (FOR ICD-9 250.00, 250.01). 06/26/15   Wynetta Emery, Megan P, DO  buPROPion (WELLBUTRIN XL) 150 MG 24 hr tablet Take 450 mg at bedtime by mouth.     [provider]    celecoxib (CELEBREX) 200 MG capsule Take 1 capsule (200 mg total) by mouth 2 (two) times daily. 06/23/17   Johnson, Megan P, DO  clonazePAM (KLONOPIN) 0.5 MG tablet Take 0.5 mg 3 (three) times daily as needed by mouth for anxiety.  09/07/16   [provider]  [employer] GLUCOSE 4-6 GM-MG CHEW chewable tablet CHEW 3 TABLETS (15 G TOTAL) BY MOUTH AS NEEDED FOR LOW BLOOD SUGAR. 09/08/16   [provider]  cyanocobalamin (,VITAMIN B-12,) 1000 MCG/ML injection INJECT 1 ML INTRAMUSCULARLY EVERY 2 WEEKS Patient taking differently: Inject 1,000 mcg every 14 (fourteen) days into the muscle.  07/08/17   Johnson, Megan P, DO  divalproex (DEPAKOTE ER) 500 MG 24 hr tablet TAKE 2 TABLETS BY MOUTH AT BEDTIME 07/08/17   [provider]  glucose blood test strip Use as instructed 09/08/16   Park Liter P, DO  HYDROcodone-ibuprofen (VICOPROFEN) 7.5-200 MG tablet 1 tablet every 10 hours PRN severe pain 06/23/17   Park Liter P, DO  lamoTRIgine (LAMICTAL) 200 MG tablet Take 200 mg daily by mouth.    [provider]  lidocaine (LIDODERM) 5 % Place 1 patch onto the skin daily. Remove & Discard patch within 12 hours or as directed by MD 10/14/17   Volney American, PA-C  omeprazole (PRILOSEC) 20 MG capsule Take 20 mg 2 (two) times daily before a meal by mouth.  08/31/16   [provider]  ondansetron (ZOFRAN) 4 MG tablet TAKE 1 TABLET BY MOUTH EVERY 8 HOURS AS NEEDED FOR NAUSEA AND VOMITING 10/28/17   Wynetta Emery, Megan P, DO  promethazine (PHENERGAN) 12.5 MG tablet Take 1 tablet (12.5 mg total) by mouth every 6 (six) hours as needed for nausea or vomiting. 11/30/17   Merlyn Lot, MD  promethazine (PHENERGAN) 25 MG tablet Take 1 tablet (25 mg total) by mouth every 8 (eight) hours as needed for nausea or vomiting. 06/23/17   Johnson, Megan P, DO  sertraline (ZOLOFT) 100 MG tablet Take 200 mg by mouth daily.    [provider]  SPRINTEC 28 0.25-35 MG-MCG tablet TAKE 1 TABLET  BY MOUTH DAILY 03/03/17   Johnson, Megan P, DO  tiZANidine (ZANAFLEX) 4 MG capsule TAKE 1 CAPSULE (4 MG TOTAL) BY MOUTH 3 (THREE) TIMES DAILY AS NEEDED FOR MUSCLE SPASMS. 06/23/17   Johnson, Megan P, DO  traMADol (ULTRAM) 50 MG tablet tramadol 50 mg tablet  TAKE 1 TABLET BY MOUTH EVERY 6 HOURS AS NEEDED FOR PAIN 10/14/17   Volney American, PA-C    Allergies Depakote [divalproex sodium] and Acetaminophen    Social History Social History   Tobacco Use  . Smoking status: Never Smoker  . Smokeless tobacco: Never Used  Substance Use Topics  . Alcohol use: No  . Drug use: No    Review of Systems Patient denies headaches,  rhinorrhea, blurry vision, numbness, shortness of breath, chest pain, edema, cough, abdominal pain, nausea, vomiting, diarrhea, dysuria, fevers, rashes or hallucinations unless otherwise stated above in HPI. ____________________________________________   PHYSICAL EXAM:  VITAL SIGNS: Vitals:   11/30/17 1010  BP: 114/77  Pulse: 77  Resp: 20  Temp: 98.7 F (37.1 C)  SpO2: 100%    Constitutional: Alert and oriented. Well appearing and in no acute distress. Eyes: Conjunctivae are normal.  Head: Atraumatic. Nose: No congestion/rhinnorhea. Mouth/Throat: Mucous membranes are moist.   Neck: No stridor. Painless ROM.  Cardiovascular: Normal rate, regular rhythm. Grossly normal heart sounds.  Good peripheral circulation. Respiratory: Normal respiratory effort.  No retractions. Lungs CTAB. Gastrointestinal: Soft and nontender. No distention. No abdominal bruits. No CVA tenderness. Genitourinary:  Musculoskeletal: No lower extremity tenderness nor edema.  No joint effusions. Neurologic:  Normal speech and language. No gross focal neurologic deficits are appreciated. No facial droop Skin:  Skin is warm, dry and intact. No rash noted. Psychiatric: Mood and affect are normal. Speech and behavior are normal.  ____________________________________________    LABS (all labs ordered are listed, but only abnormal results are displayed)  Results for orders placed or performed during the hospital encounter of 11/30/17 (from the past 24 hour(s))  CBC     Status: None   Collection Time: 11/30/17 11:04 AM  Result Value Ref Range   WBC 9.0 3.6 - 11.0 K/uL   RBC 4.30 3.80 - 5.20 MIL/uL   Hemoglobin 12.2 12.0 - 16.0 g/dL   HCT 37.1 35.0 - 47.0 %   MCV 86.2 80.0 - 100.0 fL   MCH 28.5 26.0 - 34.0 pg   MCHC 33.0 32.0 - 36.0 g/dL   RDW 13.6 11.5 - 14.5 %   Platelets 282 150 - 440 K/uL  Troponin I     Status: None   Collection Time: 11/30/17 11:04 AM  Result Value Ref Range   Troponin I <0.03 <0.03 ng/mL  Comprehensive metabolic panel     Status: Abnormal   Collection Time: 11/30/17 11:04 AM  Result Value Ref Range   Sodium 135 135 - 145 mmol/L   Potassium 4.0 3.5 - 5.1 mmol/L   Chloride 105 101 - 111 mmol/L   CO2 25 22 - 32 mmol/L   Glucose, Bld 96 65 - 99 mg/dL   BUN 8 6 - 20 mg/dL   Creatinine, Ser 0.75 0.44 - 1.00 mg/dL   Calcium 8.5 (L) 8.9 - 10.3 mg/dL   Total Protein 7.0 6.5 - 8.1 g/dL   Albumin 3.7 3.5 - 5.0 g/dL   AST 16 15 - 41 U/L   ALT 13 (L) 14 - 54 U/L   Alkaline Phosphatase 72 38 - 126 U/L   Total Bilirubin 0.4 0.3 - 1.2 mg/dL   GFR calc non Af Amer >60 >60 mL/min   GFR calc Af Amer >60 >60 mL/min   Anion gap 5 5 - 15  Urinalysis, Complete w Microscopic     Status: Abnormal   Collection Time: 11/30/17 11:04 AM  Result Value Ref Range   Color, Urine YELLOW (A) YELLOW   APPearance CLEAR (A) CLEAR   Specific Gravity, Urine 1.008 1.005 - 1.030   pH 7.0 5.0 - 8.0   Glucose, UA NEGATIVE NEGATIVE mg/dL   Hgb urine dipstick NEGATIVE NEGATIVE   Bilirubin Urine NEGATIVE NEGATIVE   Ketones, ur NEGATIVE NEGATIVE mg/dL   Protein, ur NEGATIVE NEGATIVE mg/dL   Nitrite NEGATIVE NEGATIVE   Leukocytes, UA NEGATIVE NEGATIVE  RBC / HPF 0-5 0 - 5 RBC/hpf   WBC, UA 0-5 0 - 5 WBC/hpf   Bacteria, UA RARE (A) NONE SEEN   Squamous  Epithelial / LPF 0-5 (A) NONE SEEN  TSH     Status: None   Collection Time: 11/30/17 11:04 AM  Result Value Ref Range   TSH 1.526 0.350 - 4.500 uIU/mL  POC urine preg, ED     Status: None   Collection Time: 11/30/17 12:18 PM  Result Value Ref Range   Preg Test, Ur Negative Negative   ____________________________________________  EKG My review and personal interpretation at Time: 10:16   Indication: palpitations  Rate: 70  Rhythm: sinus Axis: normal Other: normal intervals, no wpw, brugada, nml qt ____________________________________________  RADIOLOGY   ____________________________________________   PROCEDURES  Procedure(s) performed:  Procedures    Critical Care performed: no ____________________________________________   INITIAL IMPRESSION / ASSESSMENT AND PLAN / ED COURSE  Pertinent labs & imaging results that were available during my care of the patient were reviewed by me and considered in my medical decision making (see chart for details).  DDX: dysrhythmia, electrolyte abn, dehydration, acs, anxiety  Belinda [name] is a 42 y.o. who presents to the ED with symptoms as described above.  Patient well-appearing and in no acute distress.  Blood work sent for the above differential was also reassuring.  Her EKG shows no evidence of acute ischemia or preexcitation syndrome.  No signs or symptoms of heart failure.  Her abdominal exam is soft and benign.  Patient given IV fluids for component of possible dehydration.  Patient is tolerating oral at this point do believe she is stable and appropriate for follow-up as an outpatient.  Discussed signs and symptoms for which she should return immediately to the hospital.  Have discussed with the patient and available family all diagnostics and treatments performed thus far and all questions were answered to the best of my ability. The patient demonstrates understanding and agreement with plan.        ____________________________________________   FINAL CLINICAL IMPRESSION(S) / ED DIAGNOSES  Final diagnoses:  Palpitations      NEW MEDICATIONS STARTED DURING THIS VISIT:  New Prescriptions   PROMETHAZINE (PHENERGAN) 12.5 MG TABLET    Take 1 tablet (12.5 mg total) by mouth every 6 (six) hours as needed for nausea or vomiting.     Note:  This document was prepared using Dragon voice recognition software and may include unintentional dictation errors.    Merlyn Lot, MD 11/30/17 [phone]

## 2017-11-30 NOTE — ED Triage Notes (Signed)
States twice yesterday and once this am had a feeling of heart racing for approx 10 minutes. Felt weak for about 30 min after.

## 2017-11-30 NOTE — Telephone Encounter (Signed)
Pt reports "Heart racing, pounding, fluttering." Not present during triage. States sudden onset. Two episodes yesterday; one during night, one this AM. Report dizziness when occurs, "feels like I'm about to pass out, room spinning, things go dark."  Episodes occur at rest. Also states abdominal pain,middle of abdomen, comes and goes 7/10 when occurs, nausea, no vomiting.No CP. H/O panic attacks. States has not had one in years and "doesn't feel the same." Did take klonopin at HS, not this AM.  Also with H/O DM, has not checked BS, "can't find monitor."  HR during call 78.  Pt directed to UC. States husband will drive.   Reason for Disposition . [1] Heart beating very rapidly (e.g., > 140 / minute) AND [2] not present now  (Exception: during exercise)  Answer Assessment - Initial Assessment Questions 1. DESCRIPTION: "Please describe your heart rate or heart beat that you are having" (e.g., fast/slow, regular/irregular, skipped or extra beats, "palpitations")    HR pounding, fast, fluttering 2. ONSET: "When did it start?" (Minutes, hours or days)      Yesterday 3. DURATION: "How long does it last" (e.g., seconds, minutes, hours)     "30 seconds or so." 4. PATTERN "Does it come and go, or has it been constant since it started?"  "Does it get worse with exertion?"   "Are you feeling it now?"    Comes and goes,Not feeling now.Not worse with exertion 5. TAP: "Using your hand, can you tap out what you are feeling on a chair or table in front of you, so that I can hear?" (Note: not all patients can do this)       N/A 6. HEART RATE: "Can you tell me your heart rate?" "How many beats in 15 seconds?"  (Note: not all patients can do this)       78 7. RECURRENT SYMPTOM: "Have you ever had this before?" If so, ask: "When was the last time?" and "What happened that time?"      Has had panic attacks years ago. "Doesn't feel the same." 8. CAUSE: "What do you think is causing the palpitations?"     Could be panic  attack, low blood sugar. Not sure. 9. CARDIAC HISTORY: "Do you have any history of heart disease?" (e.g., heart attack, angina, bypass surgery, angioplasty, arrhythmia)     No  10. OTHER SYMPTOMS: "Do you have any other symptoms?" (e.g., dizziness, chest pain, sweating, difficulty breathing)      Dizziness, room spinning;" Feels like I'm going to pass out, goes dark."  Abdominal pain, middle of abdomen, comes and goes,  7/10, nausea. 11. PREGNANCY: "Is there any chance you are pregnant?" "When was your last menstrual period?" No, finished yesterday  Protocols used: HEART RATE AND HEARTBEAT QUESTIONS-A-AH

## 2017-12-07 ENCOUNTER — Encounter: Payer: Self-pay | Admitting: Family Medicine

## 2017-12-09 ENCOUNTER — Ambulatory Visit: Payer: 59 | Admitting: Family Medicine

## 2017-12-22 ENCOUNTER — Encounter: Payer: Self-pay | Admitting: Family Medicine

## 2018-01-18 ENCOUNTER — Ambulatory Visit: Payer: 59 | Admitting: Family Medicine

## 2018-01-18 ENCOUNTER — Encounter: Payer: Self-pay | Admitting: Family Medicine

## 2018-01-18 VITALS — BP 125/83 | HR 68 | Wt 194.4 lb

## 2018-01-18 DIAGNOSIS — R079 Chest pain, unspecified: Secondary | ICD-10-CM | POA: Diagnosis not present

## 2018-01-18 DIAGNOSIS — R002 Palpitations: Secondary | ICD-10-CM | POA: Diagnosis not present

## 2018-01-18 DIAGNOSIS — M47816 Spondylosis without myelopathy or radiculopathy, lumbar region: Secondary | ICD-10-CM | POA: Diagnosis not present

## 2018-01-18 DIAGNOSIS — F3162 Bipolar disorder, current episode mixed, moderate: Secondary | ICD-10-CM | POA: Diagnosis not present

## 2018-01-18 MED ORDER — TRAMADOL HCL 50 MG PO TABS: ORAL_TABLET | ORAL | 0 refills | Status: DC

## 2018-01-18 NOTE — Progress Notes (Signed)
BP 125/83   Pulse 68   Wt 194 lb 6.4 oz (88.2 kg)   SpO2 98%   BMI 35.56 kg/m    Subjective:    Patient ID: Belinda [name], female    DOB: Dec 13, 1975, 42 y.o.   MRN: [id number]  HPI: Belinda [name] is a 42 y.o. female  Chief Complaint  Patient presents with  . Back Pain    Pt ran out of Tramadol  . Leg Pain    Left leg pain.    BACK PAIN Duration: chronic- worse the past couple of weeks with pain running down her L leg Mechanism of injury: no trauma Location: bilateral and low back Onset: gradual Severity: severe Quality: aching, severe Frequency: intermittent Radiation: L leg below the knee Aggravating factors: sitting on the floor, having her leg bent Alleviating factors: nothing Status: worse Treatments attempted: rest, heat, ibuprofen and aleve  Relief with NSAIDs?: no Nighttime pain:  yes Paresthesias / decreased sensation:  no Bowel / bladder incontinence:  no Fevers:  no Dysuria / urinary frequency:  no  CHEST PAIN- Has been having some issues with panic attacks, seem to be getting worse. Has passed out. Heart racing a lot. Last time it happened was Saturday, but still feeling tightness in her chest and very tired.  Duration:2 weeks Onset: sudden Quality: aching and tight Severity: moderate Location: upper sternum Radiation: none Episode duration: hours, this last one has continued for several days Frequency: occasional Related to exertion: no Activity when pain started: feeling anxious Trauma: no Anxiety/recent stressors: yes Status: worse Current pain status: feeling tight Shortness of breath: yes Cough: no Nausea: yes Diaphoresis: no Heartburn: no Palpitations: yes  Relevant past medical, surgical, family and social history reviewed and updated as indicated. Interim medical history since our last visit reviewed. Allergies and medications reviewed and updated.  Review of Systems  Constitutional: Positive for fatigue. Negative for  activity change, appetite change, chills, diaphoresis, fever and unexpected weight change.  HENT: Negative.   Respiratory: Positive for shortness of breath. Negative for apnea, cough, choking, chest tightness, wheezing and stridor.   Cardiovascular: Positive for chest pain and palpitations. Negative for leg swelling.  Musculoskeletal: Positive for back pain, gait problem and myalgias. Negative for arthralgias, joint swelling, neck pain and neck stiffness.  Skin: Negative.   Neurological: Positive for syncope. Negative for dizziness, tremors, seizures, facial asymmetry, speech difficulty, weakness, light-headedness, numbness and headaches.  Psychiatric/Behavioral: Negative.     Per HPI unless specifically indicated above     Objective:    BP 125/83   Pulse 68   Wt 194 lb 6.4 oz (88.2 kg)   SpO2 98%   BMI 35.56 kg/m   Wt Readings from Last 3 Encounters:  01/18/18 194 lb 6.4 oz (88.2 kg)  11/30/17 197 lb (89.4 kg)  10/14/17 201 lb 9.6 oz (91.4 kg)    Physical Exam  Constitutional: She is oriented to person, place, and time. She appears well-developed and well-nourished. No distress.  HENT:  Head: Normocephalic and atraumatic.  Right Ear: Hearing normal.  Left Ear: Hearing normal.  Nose: Nose normal.  Eyes: Conjunctivae and lids are normal. Right eye exhibits no discharge. Left eye exhibits no discharge. No scleral icterus.  Cardiovascular: Normal rate, regular rhythm, normal heart sounds and intact distal pulses. Exam reveals no gallop and no friction rub.  No murmur heard. Pulmonary/Chest: Effort normal and breath sounds normal. No stridor. No respiratory distress. She has no wheezes. She has no rales.  She exhibits no tenderness.  Neurological: She is alert and oriented to person, place, and time.  Skin: Skin is intact. No rash noted. She is not diaphoretic.  Psychiatric: She has a normal mood and affect. Her speech is normal and behavior is normal. Judgment and thought content  normal. Cognition and memory are normal.  Nursing note and vitals reviewed. Back Exam:    Inspection:  Normal spinal curvature.  No deformity, ecchymosis, erythema, or lesions     Palpation:     Midline spinal tenderness: no      Paralumbar tenderness: yes bilateral     Parathoracic tenderness: no      Buttocks tenderness: no     Range of Motion:      Flexion: Fingers to Knees     Extension:Decreased     Lateral bending:Decreased    Rotation:Decreased    Neuro Exam:Lower extremity DTRs normal & symmetric.  Strength and sensation intact.    Special Tests:      Straight leg raise:negative  Results for orders placed or performed during the hospital encounter of 11/30/17  CBC  Result Value Ref Range   WBC 9.0 3.6 - 11.0 K/uL   RBC 4.30 3.80 - 5.20 MIL/uL   Hemoglobin 12.2 12.0 - 16.0 g/dL   HCT 16.1 09.6 - 04.5 %   MCV 86.2 80.0 - 100.0 fL   MCH 28.5 26.0 - 34.0 pg   MCHC 33.0 32.0 - 36.0 g/dL   RDW 40.9 81.1 - 91.4 %   Platelets 282 150 - 440 K/uL  Troponin I  Result Value Ref Range   Troponin I <0.03 <0.03 ng/mL  Comprehensive metabolic panel  Result Value Ref Range   Sodium 135 135 - 145 mmol/L   Potassium 4.0 3.5 - 5.1 mmol/L   Chloride 105 101 - 111 mmol/L   CO2 25 22 - 32 mmol/L   Glucose, Bld 96 65 - 99 mg/dL   BUN 8 6 - 20 mg/dL   Creatinine, Ser 7.82 0.44 - 1.00 mg/dL   Calcium 8.5 (L) 8.9 - 10.3 mg/dL   Total Protein 7.0 6.5 - 8.1 g/dL   Albumin 3.7 3.5 - 5.0 g/dL   AST 16 15 - 41 U/L   ALT 13 (L) 14 - 54 U/L   Alkaline Phosphatase 72 38 - 126 U/L   Total Bilirubin 0.4 0.3 - 1.2 mg/dL   GFR calc non Af Amer >60 >60 mL/min   GFR calc Af Amer >60 >60 mL/min   Anion gap 5 5 - 15  Urinalysis, Complete w Microscopic  Result Value Ref Range   Color, Urine YELLOW (A) YELLOW   APPearance CLEAR (A) CLEAR   Specific Gravity, Urine 1.008 1.005 - 1.030   pH 7.0 5.0 - 8.0   Glucose, UA NEGATIVE NEGATIVE mg/dL   Hgb urine dipstick NEGATIVE NEGATIVE   Bilirubin  Urine NEGATIVE NEGATIVE   Ketones, ur NEGATIVE NEGATIVE mg/dL   Protein, ur NEGATIVE NEGATIVE mg/dL   Nitrite NEGATIVE NEGATIVE   Leukocytes, UA NEGATIVE NEGATIVE   RBC / HPF 0-5 0 - 5 RBC/hpf   WBC, UA 0-5 0 - 5 WBC/hpf   Bacteria, UA RARE (A) NONE SEEN   Squamous Epithelial / LPF 0-5 (A) NONE SEEN  TSH  Result Value Ref Range   TSH 1.526 0.350 - 4.500 uIU/mL  POC urine preg, ED  Result Value Ref Range   Preg Test, Ur Negative Negative      Assessment & Plan:  Problem List Items Addressed This Visit      Musculoskeletal and Integument   Arthritis of lumbar spine    Will treat with tizanidine, refill of tramadol given. Continue celebrex. Stretches given. If not better in 1-2 weeks, will get into PT. Call with any concerns.       Relevant Medications   traMADol (ULTRAM) 50 MG tablet     Other   Bipolar affective disorder (HCC)    Not under good control. Will continue to follow with psychiatry. Call with any concerns.        Other Visit Diagnoses    Palpitations    -  Primary   Likely due to panic attacks, but is very concerned. Will get her into see cardiology. Referral generated today. EKG normal today.   Relevant Orders   EKG 12-Lead (Completed)   Ambulatory referral to Cardiology   Chest pain, unspecified type       Likely due to panic attacks, but is very concerned. Will get her into see cardiology. Referral generated today. EKG normal today.   Relevant Orders   Ambulatory referral to Cardiology       Follow up plan: Return if symptoms worsen or fail to improve.

## 2018-01-18 NOTE — Patient Instructions (Signed)

## 2018-01-18 NOTE — Assessment & Plan Note (Signed)
Not under good control. Will continue to follow with psychiatry. Call with any concerns.

## 2018-01-18 NOTE — Assessment & Plan Note (Addendum)
Will treat with tizanidine, refill of tramadol given. Continue celebrex. Stretches given. If not better in 1-2 weeks, will get into PT. Call with any concerns.

## 2018-01-26 ENCOUNTER — Telehealth: Payer: Self-pay | Admitting: Family Medicine

## 2018-01-26 NOTE — Telephone Encounter (Signed)
Called to check in to see if her back is feeling better. LMOM for her to call back. If not better, will do PT. If better, great! CRM generated.

## 2018-01-26 NOTE — Telephone Encounter (Signed)
-----   Message from Dorcas CarrowMegan P Johnson, [place] sent at 01/18/2018 12:18 PM EDT ----- Call about her back

## 2018-01-29 ENCOUNTER — Encounter: Payer: Self-pay | Admitting: Family Medicine

## 2018-01-29 NOTE — Telephone Encounter (Signed)
Mychart message sent.

## 2018-02-01 ENCOUNTER — Encounter: Payer: Self-pay | Admitting: *Deleted

## 2018-02-01 ENCOUNTER — Telehealth: Payer: Self-pay | Admitting: Gastroenterology

## 2018-02-01 NOTE — Telephone Encounter (Signed)
LVM to call our office for a recall appt. 

## 2018-02-04 ENCOUNTER — Encounter: Payer: Self-pay | Admitting: Family Medicine

## 2018-02-05 MED ORDER — MONTELUKAST SODIUM 10 MG PO TABS: 10.0000 mg | ORAL_TABLET | Freq: Every day | ORAL | 3 refills | Status: DC

## 2018-02-07 ENCOUNTER — Encounter: Payer: Self-pay | Admitting: Family Medicine

## 2018-02-11 ENCOUNTER — Other Ambulatory Visit: Payer: Self-pay | Admitting: Family Medicine

## 2018-02-15 ENCOUNTER — Other Ambulatory Visit: Payer: Self-pay | Admitting: Family Medicine

## 2018-03-15 ENCOUNTER — Ambulatory Visit (INDEPENDENT_AMBULATORY_CARE_PROVIDER_SITE_OTHER): Payer: Self-pay | Admitting: Family Medicine

## 2018-03-15 ENCOUNTER — Telehealth: Payer: Self-pay | Admitting: Family Medicine

## 2018-03-15 ENCOUNTER — Encounter: Payer: Self-pay | Admitting: Family Medicine

## 2018-03-15 VITALS — BP 121/77 | HR 83 | Temp 98.6°F | Wt 194.6 lb

## 2018-03-15 DIAGNOSIS — M5442 Lumbago with sciatica, left side: Secondary | ICD-10-CM

## 2018-03-15 DIAGNOSIS — G8929 Other chronic pain: Secondary | ICD-10-CM

## 2018-03-15 MED ORDER — HYDROCODONE-IBUPROFEN 5-200 MG PO TABS: 1.0000 | ORAL_TABLET | Freq: Three times a day (TID) | ORAL | 0 refills | Status: DC | PRN

## 2018-03-15 NOTE — Progress Notes (Signed)
BP 121/77 (BP Location: Right Arm, Patient Position: Sitting, Cuff Size: Normal)   Pulse 83   Temp 98.6 F (37 C) (Oral)   Wt 194 lb 9.6 oz (88.3 kg)   SpO2 99%   BMI 35.59 kg/m    Subjective:    Patient ID: Belinda [name], female    DOB: [birth year], 42 y.o.   MRN: [id number]  HPI: Belinda [name] is a 42 y.o. female  Chief Complaint  Patient presents with  . Medication Refill   BACK PAIN Duration: chronic- has been worse over the past 3-4 weeks because she's been doing more, notes that the tramadol doesn't seem to be helping as much, lasts shorter amount of time, has left her job, so currently doesn't have insurance. Would like to do hydrocodone again instead of the tramadol Mechanism of injury: no trauma Location: bilateral and low back Onset: gradual Severity: 4/10 Quality: aching Frequency: intermittent Radiation: L leg below the knee Aggravating factors: sitting on the floor, having her leg bent Alleviating factors: nothing Status: worse Treatments attempted: rest, ice, heat, APAP, ibuprofen and aleve  Relief with NSAIDs?: no Nighttime pain:  yes Paresthesias / decreased sensation:  no Bowel / bladder incontinence:  no Fevers:  no Dysuria / urinary frequency:  no   Relevant past medical, surgical, family and social history reviewed and updated as indicated. Interim medical history since our last visit reviewed. Allergies and medications reviewed and updated.  Review of Systems  Constitutional: Negative.   Respiratory: Negative.   Cardiovascular: Negative.   Musculoskeletal: Positive for back pain, gait problem and myalgias. Negative for arthralgias, joint swelling and neck stiffness.  Neurological: Negative for dizziness, tremors, seizures, syncope, facial asymmetry, speech difficulty, weakness, light-headedness, numbness and headaches.  Psychiatric/Behavioral: Negative.     Per HPI unless specifically indicated above     Objective:    BP 121/77 (BP  Location: Right Arm, Patient Position: Sitting, Cuff Size: Normal)   Pulse 83   Temp 98.6 F (37 C) (Oral)   Wt 194 lb 9.6 oz (88.3 kg)   SpO2 99%   BMI 35.59 kg/m   Wt Readings from Last 3 Encounters:  03/15/18 194 lb 9.6 oz (88.3 kg)  01/18/18 194 lb 6.4 oz (88.2 kg)  11/30/17 197 lb (89.4 kg)    Physical Exam  Constitutional: She is oriented to person, place, and time. She appears well-developed and well-nourished. No distress.  HENT:  Head: Normocephalic and atraumatic.  Right Ear: Hearing normal.  Left Ear: Hearing normal.  Nose: Nose normal.  Eyes: Conjunctivae and lids are normal. Right eye exhibits no discharge. Left eye exhibits no discharge. No scleral icterus.  Cardiovascular: Normal rate, regular rhythm, normal heart sounds and intact distal pulses. Exam reveals no gallop and no friction rub.  No murmur heard. Pulmonary/Chest: Effort normal and breath sounds normal. No stridor. No respiratory distress. She has no wheezes. She has no rales. She exhibits no tenderness.  Neurological: She is alert and oriented to person, place, and time.  Skin: Skin is warm, dry and intact. Capillary refill takes less than 2 seconds. No rash noted. She is not diaphoretic. No erythema. No pallor.  Psychiatric: She has a normal mood and affect. Her speech is normal and behavior is normal. Judgment and thought content normal. Cognition and memory are normal.  Nursing note and vitals reviewed. Back Exam:    Inspection: Abnormal Inspection     Palpation:     Midline spinal tenderness: no  Paralumbar tenderness: yes Left     Parathoracic tenderness: no      Buttocks tenderness: no     Range of Motion:      Flexion: Fingers to Knees     Extension:Decreased     Lateral bending:Decreased    Rotation:Decreased    Neuro Exam:Lower extremity DTRs normal & symmetric.  Strength and sensation intact.    Special Tests:      Straight leg raise:negative   Results for orders placed or performed  during the hospital encounter of 11/30/17  CBC  Result Value Ref Range   WBC 9.0 3.6 - 11.0 K/uL   RBC 4.30 3.80 - 5.20 MIL/uL   Hemoglobin 12.2 12.0 - 16.0 g/dL   HCT 11.937.1 14.735.0 - 82.947.0 %   MCV 86.2 80.0 - 100.0 fL   MCH 28.5 26.0 - 34.0 pg   MCHC 33.0 32.0 - 36.0 g/dL   RDW 56.213.6 13.011.5 - 86.514.5 %   Platelets 282 150 - 440 K/uL  Troponin I  Result Value Ref Range   Troponin I <0.03 <0.03 ng/mL  Comprehensive metabolic panel  Result Value Ref Range   Sodium 135 135 - 145 mmol/L   Potassium 4.0 3.5 - 5.1 mmol/L   Chloride 105 101 - 111 mmol/L   CO2 25 22 - 32 mmol/L   Glucose, Bld 96 65 - 99 mg/dL   BUN 8 6 - 20 mg/dL   Creatinine, Ser 7.840.75 0.44 - 1.00 mg/dL   Calcium 8.5 (L) 8.9 - 10.3 mg/dL   Total Protein 7.0 6.5 - 8.1 g/dL   Albumin 3.7 3.5 - 5.0 g/dL   AST 16 15 - 41 U/L   ALT 13 (L) 14 - 54 U/L   Alkaline Phosphatase 72 38 - 126 U/L   Total Bilirubin 0.4 0.3 - 1.2 mg/dL   GFR calc non Af Amer >60 >60 mL/min   GFR calc Af Amer >60 >60 mL/min   Anion gap 5 5 - 15  Urinalysis, Complete w Microscopic  Result Value Ref Range   Color, Urine YELLOW (A) YELLOW   APPearance CLEAR (A) CLEAR   Specific Gravity, Urine 1.008 1.005 - 1.030   pH 7.0 5.0 - 8.0   Glucose, UA NEGATIVE NEGATIVE mg/dL   Hgb urine dipstick NEGATIVE NEGATIVE   Bilirubin Urine NEGATIVE NEGATIVE   Ketones, ur NEGATIVE NEGATIVE mg/dL   Protein, ur NEGATIVE NEGATIVE mg/dL   Nitrite NEGATIVE NEGATIVE   Leukocytes, UA NEGATIVE NEGATIVE   RBC / HPF 0-5 0 - 5 RBC/hpf   WBC, UA 0-5 0 - 5 WBC/hpf   Bacteria, UA RARE (A) NONE SEEN   Squamous Epithelial / LPF 0-5 (A) NONE SEEN  TSH  Result Value Ref Range   TSH 1.526 0.350 - 4.500 uIU/mL  POC urine preg, ED  Result Value Ref Range   Preg Test, Ur Negative Negative      Assessment & Plan:   Problem List Items Addressed This Visit    None    Visit Diagnoses    Chronic bilateral low back pain with left-sided sciatica    -  Primary   Will obtain x-ray.  Continue celebrex. Occasional use of hydrocodone. Rx given. Would benefit from PT- but cannot afford it now.    Relevant Medications   hydrocodone-ibuprofen (VICOPROFEN) 5-200 MG tablet   Other Relevant Orders   DG Lumbar Spine Complete       Follow up plan: Return in about 1 month (around 04/12/2018) for  Follow up back pain.

## 2018-03-15 NOTE — Telephone Encounter (Signed)
Copied from CRM [id number]. Topic: Quick Communication - Rx Refill/Question >> Mar 15, 2018 11:45 AM Jay SchlichterWeikart, Melissa J wrote: Medication: hydrocodone-ibuprofen (VICOPROFEN) 5-200 MG tablet  Has the patient contacted their pharmacy? Yes.   (Agent: If no, request that the patient contact the pharmacy for the refill.) (Agent: If yes, when and what did the pharmacy advise?)  Preferred Pharmacy (with phone number or street name): [facility]   -- pharm called - they do not have the 5-200 strength in stock, it will take 2-3 days to get.  They do have 7.5-200 in stock.  They also have hydrocodone-acet in a 5 strength if pcp wants to change it to that.  Cb is [phone]  Agent: Please be advised that RX refills may take up to 3 business days. We ask that you follow-up with your pharmacy.

## 2018-03-15 NOTE — Telephone Encounter (Signed)
Pharmacy notified.

## 2018-03-15 NOTE — Telephone Encounter (Signed)
OK to wait 2-3 days. Patient had pain level of 4 today. Patient is allergic to acetaminophen and I do not want to give 7.5mg 

## 2018-03-15 NOTE — Telephone Encounter (Signed)
Copied from CRM [id number]. Topic: Quick Communication - See Telephone Encounter >> Mar 15, 2018 10:03 AM Arlyss Gandyichardson, Layann Bluett N, NT wrote: CRM for notification. See Telephone encounter for: 03/15/18. [facility] [address] [facility] [place] [facility] not [facility] the hydrocodone-ibuprofen (VICOPROFEN) 5-200 MG tablet in stock but [facility] [place] [facility]. Pt would like this transferred. [facility]

## 2018-03-15 NOTE — Telephone Encounter (Signed)
Rx sent to [facility] 

## 2018-03-16 ENCOUNTER — Encounter: Payer: Self-pay | Admitting: Family Medicine

## 2018-03-19 MED ORDER — HYDROCODONE-IBUPROFEN 5-200 MG PO TABS: 1.0000 | ORAL_TABLET | Freq: Three times a day (TID) | ORAL | 0 refills | Status: DC | PRN

## 2018-03-22 ENCOUNTER — Inpatient Hospital Stay (HOSPITAL_COMMUNITY)
Admission: AD | Admit: 2018-03-22 | Discharge: 2018-03-25 | DRG: 885 | Disposition: A | Discharge status: Home / Self Care | Payer: Federal, State, Local not specified - Other | Source: Intra-hospital | Attending: Psychiatry | Admitting: Psychiatry

## 2018-03-22 ENCOUNTER — Encounter: Payer: Self-pay | Admitting: Emergency Medicine

## 2018-03-22 ENCOUNTER — Encounter (HOSPITAL_COMMUNITY): Payer: Self-pay | Admitting: *Deleted

## 2018-03-22 ENCOUNTER — Emergency Department
Admission: EM | Admit: 2018-03-22 | Discharge: 2018-03-22 | Disposition: A | Discharge status: Other Acute Inpatient Hospital | Payer: 59 | Attending: Emergency Medicine | Admitting: Emergency Medicine

## 2018-03-22 ENCOUNTER — Other Ambulatory Visit: Payer: Self-pay

## 2018-03-22 DIAGNOSIS — G4730 Sleep apnea, unspecified: Secondary | ICD-10-CM | POA: Diagnosis present

## 2018-03-22 DIAGNOSIS — Z915 Personal history of self-harm: Secondary | ICD-10-CM

## 2018-03-22 DIAGNOSIS — T424X2A Poisoning by benzodiazepines, intentional self-harm, initial encounter: Secondary | ICD-10-CM | POA: Insufficient documentation

## 2018-03-22 DIAGNOSIS — T50902A Poisoning by unspecified drugs, medicaments and biological substances, intentional self-harm, initial encounter: Secondary | ICD-10-CM

## 2018-03-22 DIAGNOSIS — F419 Anxiety disorder, unspecified: Secondary | ICD-10-CM | POA: Diagnosis present

## 2018-03-22 DIAGNOSIS — Z886 Allergy status to analgesic agent status: Secondary | ICD-10-CM | POA: Diagnosis not present

## 2018-03-22 DIAGNOSIS — Z791 Long term (current) use of non-steroidal anti-inflammatories (NSAID): Secondary | ICD-10-CM | POA: Diagnosis not present

## 2018-03-22 DIAGNOSIS — Z818 Family history of other mental and behavioral disorders: Secondary | ICD-10-CM

## 2018-03-22 DIAGNOSIS — Z79899 Other long term (current) drug therapy: Secondary | ICD-10-CM | POA: Diagnosis not present

## 2018-03-22 DIAGNOSIS — Z888 Allergy status to other drugs, medicaments and biological substances status: Secondary | ICD-10-CM | POA: Diagnosis not present

## 2018-03-22 DIAGNOSIS — E119 Type 2 diabetes mellitus without complications: Secondary | ICD-10-CM | POA: Insufficient documentation

## 2018-03-22 DIAGNOSIS — K219 Gastro-esophageal reflux disease without esophagitis: Secondary | ICD-10-CM | POA: Diagnosis present

## 2018-03-22 DIAGNOSIS — T4271XA Poisoning by unspecified antiepileptic and sedative-hypnotic drugs, accidental (unintentional), initial encounter: Secondary | ICD-10-CM

## 2018-03-22 DIAGNOSIS — F319 Bipolar disorder, unspecified: Secondary | ICD-10-CM

## 2018-03-22 DIAGNOSIS — G4700 Insomnia, unspecified: Secondary | ICD-10-CM | POA: Diagnosis present

## 2018-03-22 DIAGNOSIS — F209 Schizophrenia, unspecified: Secondary | ICD-10-CM | POA: Insufficient documentation

## 2018-03-22 DIAGNOSIS — Z9884 Bariatric surgery status: Secondary | ICD-10-CM | POA: Insufficient documentation

## 2018-03-22 DIAGNOSIS — R45851 Suicidal ideations: Secondary | ICD-10-CM | POA: Insufficient documentation

## 2018-03-22 HISTORY — DX: Type 2 diabetes mellitus without complications: E11.9

## 2018-03-22 LAB — CBC
HCT: 34.6 % — ABNORMAL LOW (ref 35.0–47.0)
Hemoglobin: 11.6 g/dL — ABNORMAL LOW (ref 12.0–16.0)
MCH: 28.1 pg (ref 26.0–34.0)
MCHC: 33.5 g/dL (ref 32.0–36.0)
MCV: 83.8 fL (ref 80.0–100.0)
Platelets: 325 K/uL (ref 150–440)
RBC: 4.13 MIL/uL (ref 3.80–5.20)
RDW: 15.4 % — ABNORMAL HIGH (ref 11.5–14.5)
WBC: 9.7 K/uL (ref 3.6–11.0)

## 2018-03-22 LAB — SALICYLATE LEVEL: Salicylate Lvl: <7 mg/dL (ref 2.8–30.0)

## 2018-03-22 LAB — COMPREHENSIVE METABOLIC PANEL WITH GFR
ALT: 13 U/L — ABNORMAL LOW (ref 14–54)
AST: 18 U/L (ref 15–41)
Albumin: 3.8 g/dL (ref 3.5–5.0)
Alkaline Phosphatase: 78 U/L (ref 38–126)
Anion gap: 9 (ref 5–15)
BUN: 13 mg/dL (ref 6–20)
CO2: 22 mmol/L (ref 22–32)
Calcium: 8.4 mg/dL — ABNORMAL LOW (ref 8.9–10.3)
Chloride: 104 mmol/L (ref 101–111)
Creatinine, Ser: 0.67 mg/dL (ref 0.44–1.00)
GFR calc Af Amer: >60 mL/min
GFR calc non Af Amer: >60 mL/min
Glucose, Bld: 109 mg/dL — ABNORMAL HIGH (ref 65–99)
Potassium: 4.2 mmol/L (ref 3.5–5.1)
Sodium: 135 mmol/L (ref 135–145)
Total Bilirubin: 0.4 mg/dL (ref 0.3–1.2)
Total Protein: 6.9 g/dL (ref 6.5–8.1)

## 2018-03-22 LAB — URINE DRUG SCREEN, QUALITATIVE (ARMC ONLY)
Amphetamines, Ur Screen: NOT DETECTED
Benzodiazepine, Ur Scrn: NOT DETECTED
Cannabinoid 50 Ng, Ur ~~LOC~~: NOT DETECTED
Cocaine Metabolite,Ur ~~LOC~~: NOT DETECTED
MDMA (Ecstasy)Ur Screen: NOT DETECTED
Methadone Scn, Ur: NOT DETECTED
Opiate, Ur Screen: POSITIVE — AB
Phencyclidine (PCP) Ur S: NOT DETECTED
Tricyclic, Ur Screen: NOT DETECTED

## 2018-03-22 LAB — ACETAMINOPHEN LEVEL: Acetaminophen (Tylenol), Serum: <10 ug/mL — ABNORMAL LOW (ref 10–30)

## 2018-03-22 LAB — POCT PREGNANCY, URINE: PREG TEST UR: NEGATIVE

## 2018-03-22 LAB — ETHANOL: Alcohol, Ethyl (B): 14 mg/dL — ABNORMAL HIGH (ref <10)

## 2018-03-22 MED ORDER — BUPROPION HCL ER (XL) 300 MG PO TB24
300.0000 mg | ORAL_TABLET | Freq: Every day | ORAL | Status: DC
  Administered 2018-03-23 – 2018-03-25 (×3): 300 mg via ORAL
  Filled 2018-03-22 (×5): qty 1

## 2018-03-22 MED ORDER — TRAZODONE HCL 100 MG PO TABS: 100.0000 mg | ORAL_TABLET | Freq: Every evening | ORAL | Status: DC | PRN

## 2018-03-22 MED ORDER — TRAZODONE HCL 50 MG PO TABS
50.0000 mg | ORAL_TABLET | Freq: Every evening | ORAL | Status: DC | PRN
  Filled 2018-03-22: qty 1
  Filled 2018-03-22: qty 7

## 2018-03-22 MED ORDER — ALUM & MAG HYDROXIDE-SIMETH 200-200-20 MG/5ML PO SUSP: 30.0000 mL | ORAL | Status: DC | PRN

## 2018-03-22 MED ORDER — MAGNESIUM HYDROXIDE 400 MG/5ML PO SUSP: 30.0000 mL | Freq: Every day | ORAL | Status: DC | PRN

## 2018-03-22 MED ORDER — BUPROPION HCL ER (XL) 150 MG PO TB24: 300.0000 mg | ORAL_TABLET | Freq: Every day | ORAL | Status: DC

## 2018-03-22 MED ORDER — BUPROPION HCL ER (XL) 300 MG PO TB24
300.0000 mg | ORAL_TABLET | Freq: Every day | ORAL | Status: DC
  Filled 2018-03-22: qty 1

## 2018-03-22 MED ORDER — ACETAMINOPHEN 325 MG PO TABS: 650.0000 mg | ORAL_TABLET | Freq: Four times a day (QID) | ORAL | Status: DC | PRN

## 2018-03-22 MED ORDER — SERTRALINE HCL 50 MG PO TABS: 150.0000 mg | ORAL_TABLET | Freq: Every day | ORAL | Status: DC

## 2018-03-22 MED ORDER — HYDROXYZINE HCL 25 MG PO TABS: 25.0000 mg | ORAL_TABLET | Freq: Three times a day (TID) | ORAL | Status: DC | PRN

## 2018-03-22 MED ORDER — LAMOTRIGINE 200 MG PO TABS
200.0000 mg | ORAL_TABLET | Freq: Every day | ORAL | Status: DC
  Administered 2018-03-22 – 2018-03-25 (×4): 200 mg via ORAL
  Filled 2018-03-22 (×5): qty 1
  Filled 2018-03-22: qty 2

## 2018-03-22 MED ORDER — SERTRALINE HCL 50 MG PO TABS
150.0000 mg | ORAL_TABLET | Freq: Every day | ORAL | Status: DC
  Filled 2018-03-22 (×3): qty 1

## 2018-03-22 MED ORDER — HYDROXYZINE HCL 50 MG PO TABS
50.0000 mg | ORAL_TABLET | Freq: Three times a day (TID) | ORAL | Status: DC | PRN
  Filled 2018-03-22: qty 10
  Filled 2018-03-22: qty 1

## 2018-03-22 MED ORDER — LAMOTRIGINE 100 MG PO TABS: 200.0000 mg | ORAL_TABLET | Freq: Every day | ORAL | Status: DC

## 2018-03-22 NOTE — [facility] Group Notes (Signed)
Adult Psychoeducational Group Note  Date:  03/22/2018 Time:  9:33 PM  Group Topic/Focus:  Wrap-Up Group:   The focus of this group is to help patients review their daily goal of treatment and discuss progress on daily workbooks.  Participation Level:  Active  Participation Quality:  Appropriate  Affect:  Appropriate  Cognitive:  Alert  Insight: Limited  Engagement in Group:  Limited  Modes of Intervention:  Discussion and Support  Additional Comments:  Pt was active in group. Pt arrived today. Pt goal for tomorrow is to attend groups and ask questions.  Belinda [name] 03/22/2018, 9:33 PM

## 2018-03-22 NOTE — ED Notes (Signed)
Pt admitted to this RN that she does not desire to hurt herself or anyone else.

## 2018-03-22 NOTE — ED Notes (Signed)
Report to collyn, rn 

## 2018-03-22 NOTE — ED Notes (Signed)
TTS, Belinda [name], notified of need for intake assessment.

## 2018-03-22 NOTE — Tx Team (Signed)
Initial Treatment Plan 03/22/2018 8:42 PM Belinda [name] [id number]    PATIENT STRESSORS: Marital or family conflict Medication change or noncompliance   PATIENT STRENGTHS: Ability for insight Average or above average intelligence Capable of independent living General fund of knowledge Motivation for treatment/growth Supportive family/friends   PATIENT IDENTIFIED PROBLEMS: Depression Suicidal thoughts "I have Bipolar" "Need to get my mood stabilized and better handle on my medications"                     DISCHARGE CRITERIA:  Ability to meet basic life and health needs Improved stabilization in mood, thinking, and/or behavior Verbal commitment to aftercare and medication compliance  PRELIMINARY DISCHARGE PLAN: Attend aftercare/continuing care group Return to previous living arrangement  PATIENT/FAMILY INVOLVEMENT: This treatment plan has been presented to and reviewed with the patient, Belinda LevanSusan D [name], and/or family member, .  The patient and family have been given the opportunity to ask questions and make suggestions.  Sheehan Stacey, [place], [place] 03/22/2018, 8:42 PM

## 2018-03-22 NOTE — ED Notes (Signed)
BEHAVIORAL HEALTH ROUNDING Patient sleeping: No. Patient alert and oriented: yes Behavior appropriate: Yes.  ; If no, describe:  Nutrition and fluids offered: yes Toileting and hygiene offered: Yes  Sitter present: q15 minute observations and security  monitoring Law enforcement present: Yes  ODS  
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
## 2018-03-22 NOTE — ED Notes (Signed)
Pt states she began taking her klonopin and vicoprofen last pm around 2200. Pt "I was stressed out and I was trying to help my stress, but as I kept taking them I didn't care if I lived or not". Pt states she has been having suicidal thoughts and has been treated for SI and depression in past. Pt states she currently feels "fifty-fifty" if she lives or dies. Pt is calm, appears sleepy. Pt states she spaced her ingestion of klonopin and vicoprofen over several hours to total approx 5-6 tabs of klonopin and 6-7 tabs of vicoprofen. Pt states she also had several shots of lemon vodka and 2 double old fashioned whiskeys. Pt is currently on the continous pulse oximeter and cardiac monitor. perrl 2mm and sluggish, resps are even and unlabored, no shallow.

## 2018-03-22 NOTE — ED Notes (Signed)
Pt lying in bed resting stating that she is sleepy.

## 2018-03-22 NOTE — ED Triage Notes (Signed)
Patient reports she took too much of her hydrocodone (5.200, states took 6) and her klonopin (.5 mg states took 6 or 7)  States took them over about an hour.  Reports started at 11 pm.  Patient reports this was intentional in attempt to harm herself.

## 2018-03-22 NOTE — ED Notes (Addendum)
This rn is currently at pt's bedside as one to one SI sitter.

## 2018-03-22 NOTE — BH Assessment (Signed)
Patient has been accepted to [facility].  Patient assigned to room 401-1 Accepting physician is Dr. Jama Flavorsobos.  Call report to [phone].  Representative was AC-Tina.   ER Staff is aware of it:  Lisa: ER Secretary  Dr. Caryn BeeKevin: ER MD  Morrie SheldonAshley: Patient's Nurse     Patient's Family/Support System Anastasio Champion(Alan Thorton (husband) [phone]) have been updated as well.  Address: [address] [place], [place] [address]

## 2018-03-22 NOTE — ED Notes (Signed)
Pt provided dinner tray.

## 2018-03-22 NOTE — ED Notes (Signed)
Pt states is ok to give husband password to have access to information about pt and may visit/call pt.

## 2018-03-22 NOTE — ED Notes (Signed)
PT  PLACED UNDER  IVC PAPERS PER  DR  Zenda AlpersWEBSTER MD  INFORMED  ODS OFFICER  TEAGUE AND  COLLYN  RN . PT  PENDING  CONSULT

## 2018-03-22 NOTE — ED Notes (Signed)
Report given to Ashley Smith,RN 

## 2018-03-22 NOTE — BH Assessment (Signed)
Per Dr. Clapac's patient meets criteria for inpatient psychiatric treatment.  

## 2018-03-22 NOTE — ED Notes (Signed)
Pt ambulated to toilet and was given cup for UA and POC.

## 2018-03-22 NOTE — BH Assessment (Signed)
Patient is under [facility] at [facility] with AC-Tina.

## 2018-03-22 NOTE — ED Notes (Signed)
ODS officer outside of pts room at this time. Pt denies pain or distress.

## 2018-03-22 NOTE — ED Notes (Signed)
Assuming SI sitting position at this time.AS

## 2018-03-22 NOTE — ED Provider Notes (Signed)
-----------------------------------------   6:11 PM on 03/22/2018 -----------------------------------------  [facility].  Patient appears well, ready for transport.   Minna AntisPaduchowski, Persia Lintner, MD 03/22/18 [phone]

## 2018-03-22 NOTE — ED Notes (Signed)
Pt's husband left to return to lobby, pt advocate walked with husband.  Pt appears calm, denies pain, appears in NAD. 15 min checks and officer outside of pt room per order.

## 2018-03-22 NOTE — Consult Note (Signed)
[facility] Consult   Reason for Consult: Consult for 42 year old woman who came to the emergency room after taking an overdose of prescription medicine Referring Physician: Paduchowski Patient Identification: Belinda [name] MRN:  [id number] Principal Diagnosis: Bipolar 1 disorder, depressed ([place]) Diagnosis:   Patient Active Problem List   Diagnosis Date Noted  . Bipolar 1 disorder, depressed ([place]) [F31.9] 03/22/2018  . Sedative overdose [T42.71XA] 03/22/2018  . Controlled substance agreement signed [Z79.899] 10/16/2017  . Bipolar affective disorder ([place]) [F31.9] 08/27/2017  . 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 Nausea [R11.0]   . 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 B12 deficiency [E53.8] 02/13/2016  . Neck pain [M54.2] 02/05/2016  . Pain of right arm [M79.601] 02/05/2016  . Right shoulder pain [M25.511] 02/05/2016  . Bariatric surgery status [Z98.84] 02/23/2014  . Headache [R51] 02/23/2014  . Diabetes ([place]) [E11.9] 02/21/2014  . Morbid obesity ([place]) [E66.01] 10/14/2011    Total Time spent with patient: 1 hour  Subjective:   Belinda [name] is a 42 y.o. female patient admitted with "I just kept taking more of my medicine".  HPI: Patient interviewed chart reviewed.  42 year old woman came into the hospital after taking an overdose of a combination of hydrocodone and clonazepam.  Patient says that she was taking her medicine and just started taking 1 pill after another until she had taken 6 or 7 of both of her medicines.  At that [name] she got panicked and called poison control who told her to come to the hospital.  Patient says she is vague about what she was thinking at the time.  She says that she has been "really up" for the last week and now somehow she just felt depressed and out of control.  Denies any hallucinations.  Admits that she has not been sleeping well.  She left her job a few weeks ago and has been very upset about  that ever since.  Patient did not tell her husband while she was taking the overdose.  She says she has been compliant with her other prescription medicine.  Social history: Patient lives with her husband.  No children at home.  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  Medical history: Chronic pain problems usually on tramadol although the record shows that she was given some hydrocodone recently.  History of obesity.  Substance abuse history: Patient minimizes this although it looks like there is been a little bit of concern about it at some points in the old chart.  Nevertheless she is still getting clonazepam regularly as well as recently getting some narcotic pain medicine.  I spoke with her regular outpatient psychiatrist and was told that the patient had not revealed that she was getting clonazepam from another provider.  Past Psychiatric History: Patient has a history of bipolar disorder.  Has seen psychiatrist in [place] in the past.  Left that practice and just recently started seeing Dr. Nicolasa [name] here at the hospital.  Has only seen her about 3 times.  Current medication include Zoloft Wellbutrin and lamotrigine.  Patient denies previous psychiatric hospitalizations although she admits to having tried to kill herself 2 times in the past once by overdose and once by cutting  Risk to Self: Suicidal Ideation: Yes-Currently Present Suicidal Intent: Yes-Currently Present Is patient at risk for suicide?: Yes Suicidal Plan?: Yes-Currently Present Specify Current Suicidal Plan: overdose on medication Access to Means: Yes Specify Access to Suicidal Means: prescribed med  What has been your use of drugs/alcohol within the last 12 months?: None reported How many times?: 2 Other Self Harm Risks: None reported Triggers for Past Attempts: Unpredictable Intentional Self Injurious Behavior: Cutting Comment - Self Injurious Behavior: cutting in teens Risk to Others: Homicidal  Ideation: No Thoughts of Harm to Others: No Current Homicidal Intent: No Current Homicidal Plan: No Access to Homicidal Means: No Identified Victim: None reported History of harm to others?: No Assessment of Violence: None Noted Violent Behavior Description: None reported Does patient have access to weapons?: No Criminal Charges Pending?: No Does patient have a court date: No Prior Inpatient Therapy: Prior Inpatient Therapy: No Prior Outpatient Therapy: Prior Outpatient Therapy: Yes Prior Therapy Dates: current Prior Therapy Facilty/Provider(s): [facility] Reason for Treatment: Bipolar Disorder Does patient have an ACCT team?: No Does patient have Intensive In-House Services?  : No Does patient have [facility] services? : No Does patient have P4CC services?: No  Past Medical History:  Past Medical History:  Diagnosis Date  . Arthritis    lower back  . Bipolar affective disorder ([place])   . Deviated septum   . Diabetes mellitus without complication ([place])   . GERD (gastroesophageal reflux disease)   . Headache(784.0)    migraines - none 2 months  . Peptic ulcer   . Pre-diabetes   . Schizophrenia ([place])   . Sleep apnea   . Substance abuse [facility])     Past Surgical History:  Procedure Laterality Date  . ESOPHAGOGASTRODUODENOSCOPY  08/05/2012   Procedure: ESOPHAGOGASTRODUODENOSCOPY (EGD);  Surgeon: Belinda Medal, MD;  Location: [facility];  Service: General;  Laterality: N/A;  . ESOPHAGOGASTRODUODENOSCOPY (EGD) WITH PROPOFOL N/A 08/06/2016   Procedure: ESOPHAGOGASTRODUODENOSCOPY (EGD) WITH PROPOFOL;  Surgeon: Belinda Bellows, MD;  Location: [facility];  Service: Endoscopy;  Laterality: N/A;  . ESOPHAGOGASTRODUODENOSCOPY (EGD) WITH PROPOFOL N/A 11/07/2016   Procedure: ESOPHAGOGASTRODUODENOSCOPY (EGD) WITH PROPOFOL;  Surgeon: Belinda Bellows, MD;  Location: [facility] END[facility]COPY;  Service: Endoscopy;  Laterality: N/A;  . 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/2011   Procedure: LAPAROSCOPIC ROUX-EN-Y GASTRIC;   Surgeon: Belinda Jolly, MD;  Location: [facility];  Service: General;  Laterality: N/A;  upper endoscopy  . SEPTOPLASTY  2003   Family History:  Family History  Problem Relation Age of Onset  . Diabetes Mother   . Cancer Mother 70       brain, skin  . Cancer Father 36       kidney, bone, rectal  . Hyperlipidemia Father   . Hypertension Father   . Stroke Father   . Cancer Paternal Aunt        breast  . Diabetes Brother   . Allergies Sister    Family Psychiatric  History: Patient says she had an uncle who committed suicide and that she thinks her mother had bipolar disorder as well Social History:  Social History   Substance and Sexual Activity  Alcohol Use Yes  . Alcohol/week: 1.8 oz  . Types: 3 Standard drinks or equivalent per week     Social History   Substance and Sexual Activity  Drug Use No    Social History   Socioeconomic History  . Marital status: Married    Spouse name: Not on file  . Number of children: Not on file  . Years of education: Not on file  . Highest education level: Not on file  Occupational History  . Not on file  Social Needs  . Financial resource strain: Not on file  .  Food insecurity:    Worry: Not on file    Inability: Not on file  . Transportation needs:    Medical: Not on file    Non-medical: Not on file  Tobacco Use  . Smoking status: Never Smoker  . Smokeless tobacco: Never Used  Substance and Sexual Activity  . Alcohol use: Yes    Alcohol/week: 1.8 oz    Types: 3 Standard drinks or equivalent per week  . Drug use: No  . Sexual activity: Yes    Birth control/protection: None  Lifestyle  . Physical activity:    Days per week: Not on file    Minutes per session: Not on file  . Stress: Not on file  Relationships  . Social connections:    Talks on phone: Not on file    Gets together: Not on file    Attends religious service: Not on file    Active member of club or organization: Not on file    Attends meetings of clubs  or organizations: Not on file    Relationship status: Not on file  Other Topics Concern  . Not on file  Social History Narrative  . Not on file   Additional Social History:    Allergies:   Allergies  Allergen Reactions  . Depakote [Divalproex Sodium] Other (See Comments)    Tremors  . Acetaminophen Nausea Only    Labs:  Results for orders placed or performed during the hospital encounter of 03/22/18 (from the past 48 hour(s))  Comprehensive metabolic panel     Status: Abnormal   Collection Time: 03/22/18  5:12 AM  Result Value Ref Range   Sodium 135 135 - 145 mmol/L   Potassium 4.2 3.5 - 5.1 mmol/L   Chloride 104 101 - 111 mmol/L   CO2 22 22 - 32 mmol/L   Glucose, Bld 109 (H) 65 - 99 mg/dL   BUN 13 6 - 20 mg/dL   Creatinine, Ser 0.67 0.44 - 1.00 mg/dL   Calcium 8.4 (L) 8.9 - 10.3 mg/dL   Total Protein 6.9 6.5 - 8.1 g/dL   Albumin 3.8 3.5 - 5.0 g/dL   AST 18 15 - 41 U/L   ALT 13 (L) 14 - 54 U/L   Alkaline Phosphatase 78 38 - 126 U/L   Total Bilirubin 0.4 0.3 - 1.2 mg/dL   GFR calc non Af Amer >60 >60 mL/min   GFR calc Af Amer >60 >60 mL/min    Comment: (NOTE) The eGFR has been calculated using the CKD EPI equation. This calculation has not been validated in all clinical situations. eGFR's persistently <60 mL/min signify possible Chronic Kidney Disease.    Anion gap 9 5 - 15    Comment: Performed at [facility], [address] [place], [place] [address]  Ethanol     Status: Abnormal   Collection Time: 03/22/18  5:12 AM  Result Value Ref Range   Alcohol, Ethyl (B) 14 (H) <10 mg/dL    Comment: (NOTE) Lowest detectable limit for serum alcohol is 10 mg/dL. For medical purposes only. Performed at [facility], [address] [place], [place] [address]   Salicylate level     Status: None   Collection Time: 03/22/18  5:12 AM  Result Value Ref Range   Salicylate Lvl <4.4 2.8 - 30.0 mg/dL    Comment: Performed at [facility], [address], [place], [place] [address]  Acetaminophen level     Status: Abnormal  Collection Time: 03/22/18  5:12 AM  Result Value Ref Range   Acetaminophen (Tylenol), Serum <10 (L) 10 - 30 ug/mL    Comment: (NOTE) Therapeutic concentrations vary significantly. A range of 10-30 ug/mL  may be an effective concentration for many patients. However, some  are best treated at concentrations outside of this range. Acetaminophen concentrations >150 ug/mL at 4 hours after ingestion  and >50 ug/mL at 12 hours after ingestion are often associated with  toxic reactions. Performed at [facility], [address] [place], [place] [address]   cbc     Status: Abnormal   Collection Time: 03/22/18  5:12 AM  Result Value Ref Range   WBC 9.7 3.6 - 11.0 K/uL   RBC 4.13 3.80 - 5.20 MIL/uL   Hemoglobin 11.6 (L) 12.0 - 16.0 g/dL   HCT 34.6 (L) 35.0 - 47.0 %   MCV 83.8 80.0 - 100.0 fL   MCH 28.1 26.0 - 34.0 pg   MCHC 33.5 32.0 - 36.0 g/dL   RDW 15.4 (H) 11.5 - 14.5 %   Platelets 325 150 - 440 K/uL    Comment: Performed at [facility], [address] [place],  [address]  Urine Drug Screen, Qualitative     Status: Abnormal   Collection Time: 03/22/18  8:06 AM  Result Value Ref Range   Tricyclic, Ur Screen NONE DETECTED NONE DETECTED   Amphetamines, Ur Screen NONE DETECTED NONE DETECTED   MDMA (Ecstasy)Ur Screen NONE DETECTED NONE DETECTED   Cocaine Metabolite,Ur [place] NONE DETECTED NONE DETECTED   Opiate, Ur Screen POSITIVE (A) NONE DETECTED   Phencyclidine (PCP) Ur S NONE DETECTED NONE DETECTED   Cannabinoid 50 Ng, Ur [place] NONE DETECTED NONE DETECTED   Barbiturates, Ur Screen (A) NONE DETECTED    Result not available. Reagent lot number recalled by manufacturer.   Benzodiazepine, Ur Scrn NONE DETECTED NONE DETECTED   Methadone Scn, Ur NONE DETECTED NONE DETECTED    Comment: (NOTE) Tricyclics + metabolites, urine    Cutoff 1000 ng/mL Amphetamines + metabolites, urine   Cutoff 1000 ng/mL MDMA (Ecstasy), urine              Cutoff 500 ng/mL Cocaine Metabolite, urine          Cutoff 300 ng/mL Opiate + metabolites, urine        Cutoff 300 ng/mL Phencyclidine (PCP), urine         Cutoff 25 ng/mL Cannabinoid, urine                 Cutoff 50 ng/mL Barbiturates + metabolites, urine  Cutoff 200 ng/mL Benzodiazepine, urine              Cutoff 200 ng/mL Methadone, urine                   Cutoff 300 ng/mL The urine drug screen provides only a preliminary, unconfirmed analytical test result and should not be used for non-medical purposes. Clinical consideration and professional judgment should be applied to any positive drug screen result due to possible interfering substances. A more specific alternate chemical method must be used in order to obtain a confirmed analytical result. Gas chromatography / mass spectrometry (GC/MS) is the preferred confirmat ory method. Performed at [facility], [address] [place],  [address]   Pregnancy, urine POC     Status: None   Collection Time: 03/22/18  8:11 AM  Result Value Ref Range   Preg Test, Ur  NEGATIVE NEGATIVE    Comment:        THE SENSITIVITY OF THIS METHODOLOGY IS >24 mIU/mL     No current facility-administered medications for this encounter.    Current Outpatient Medications  Medication Sig Dispense Refill  . ARIPiprazole (ABILIFY) 30 MG tablet Take 30 mg by mouth at bedtime.     Marland Kitchen buPROPion (WELLBUTRIN XL) 150 MG 24 hr tablet Take 450 mg at bedtime by mouth.     . celecoxib (CELEBREX) 200 MG capsule Take 1 capsule (200 mg total) by mouth 2 (two) times daily. 180 capsule 3  . clonazePAM (KLONOPIN) 0.5 MG tablet Take 0.5 mg 3 (three) times daily as needed by mouth for anxiety.     . cyanocobalamin (,VITAMIN B-12,) 1000 MCG/ML injection INJECT 1 ML INTRAMUSCULARLY EVERY 2 WEEKS (Patient taking differently: Inject 1,000 mcg every 14 (fourteen) days into the muscle. ) 2 mL 12  .  hydrocodone-ibuprofen (VICOPROFEN) 5-200 MG tablet Take 1 tablet by mouth every 8 (eight) hours as needed for pain (severe pain). 30 tablet 0  . lamoTRIgine (LAMICTAL) 200 MG tablet Take 200 mg daily by mouth.    . lidocaine (LIDODERM) 5 % Place 1 patch onto the skin daily. Remove & Discard patch within 12 hours or as directed by MD 30 patch 0  . ondansetron (ZOFRAN) 4 MG tablet TAKE 1 TABLET BY MOUTH EVERY 8 HOURS AS NEEDED FOR NAUSEA AND VOMITING 90 tablet 3  . promethazine (PHENERGAN) 12.5 MG tablet Take 1 tablet (12.5 mg total) by mouth every 6 (six) hours as needed for nausea or vomiting. 12 tablet 0  . promethazine (PHENERGAN) 25 MG tablet Take 1 tablet (25 mg total) by mouth every 8 (eight) hours as needed for nausea or vomiting. 20 tablet 3  . SPRINTEC 28 0.25-35 MG-MCG tablet TAKE 1 TABLET BY MOUTH DAILY 28 tablet 12  . tiZANidine (ZANAFLEX) 4 MG capsule TAKE 1 CAPSULE (4 MG TOTAL) BY MOUTH 3 (THREE) TIMES DAILY AS NEEDED FOR MUSCLE SPASMS. 90 capsule 0  . B-D 3CC LUER-LOK SYR 25GX1" 25G X 1" 3 ML MISC     . blood glucose meter kit and supplies KIT Dispense based on patient and insurance preference. Use up to four times daily as directed. (FOR ICD-9 250.00, 250.01). 1 each 0  . [employer] GLUCOSE 4-6 GM-MG CHEW chewable tablet CHEW 3 TABLETS (15 G TOTAL) BY MOUTH AS NEEDED FOR LOW BLOOD SUGAR.  12  . glucose blood test strip Use as instructed 100 each 12  . sertraline (ZOLOFT) 100 MG tablet Take 150 mg by mouth daily.       Musculoskeletal: Strength & Muscle Tone: within normal limits Gait & Station: normal Patient leans: N/A  Psychiatric Specialty Exam: Physical Exam  Nursing note and vitals reviewed. Constitutional: She appears well-developed and well-nourished.  HENT:  Head: Normocephalic and atraumatic.  Eyes: Pupils are equal, round, and reactive to light. Conjunctivae are normal.  Neck: Normal range of motion.  Cardiovascular: Regular rhythm and normal heart sounds.  Respiratory:  Effort normal. No respiratory distress.  GI: Soft. She exhibits no distension.  Musculoskeletal: Normal range of motion.  Neurological: She is alert.  Skin: Skin is warm and dry.  Psychiatric: Her affect is blunt. Her speech is delayed. She is slowed. Cognition and memory are impaired. She expresses impulsivity. She exhibits a depressed mood. She expresses suicidal ideation.    Review of Systems  Constitutional: Negative.   HENT: Negative.   Eyes: Negative.  Respiratory: Negative.   Cardiovascular: Negative.   Gastrointestinal: Negative.   Musculoskeletal: Negative.   Skin: Negative.   Neurological: Negative.   Psychiatric/Behavioral: Positive for depression, memory loss, substance abuse and suicidal ideas. Negative for hallucinations. The patient is nervous/anxious and has insomnia.     Blood pressure 117/69, pulse 74, temperature 98.4 F (36.9 C), temperature source Oral, resp. rate 16, height 5' 2" (1.575 m), weight 88 kg (194 lb), last menstrual period 03/21/2018, SpO2 99 %.Body mass index is 35.48 kg/m.  General Appearance: Disheveled  Eye Contact:  Minimal  Speech:  Slow  Volume:  Decreased  Mood:  Dysphoric  Affect:  Constricted  Thought Process:  Disorganized  Orientation:  Full (Time, Place, and Person)  Thought Content:  Illogical  Suicidal Thoughts:  Yes.  without intent/plan  Homicidal Thoughts:  No  Memory:  Immediate;   Fair Recent;   Poor Remote;   Poor  Judgement:  Impaired  Insight:  Shallow  Psychomotor Activity:  Decreased  Concentration:  Concentration: Fair  Recall:  [employer] of Knowledge:  Fair  Language:  Fair  Akathisia:  No  Handed:  Right  AIMS (if indicated):     Assets:  Desire for Improvement Housing Social Support  ADL's:  Intact  Cognition:  Impaired,  Mild  Sleep:        Treatment Plan Summary: Daily contact with patient to assess and evaluate symptoms and progress in treatment, Medication management and Plan 42 year old woman  with a history of bipolar disorder took an overdose of a combination of narcotics and clonazepam.  Having suicidal ideation at the time although it also sounds like she was disorganized and has poor memory of what was happening.  I spoke with her outpatient psychiatrist who is concerned that the patient had not been fully honest with her.  Patient is still confused and dysphoric.  I agree with continuing the IV C.  We will admit her to the psychiatric unit and continue her Zoloft Wellbutrin and lamotrigine that she usually takes outside the hospital.  Case reviewed with ER physician and TTS.  Medication will be ordered and labs will be ordered.  Disposition: Recommend psychiatric Inpatient admission when medically cleared. Supportive therapy provided about ongoing stressors.  Alethia Berthold, MD 03/22/2018 5:08 PM

## 2018-03-22 NOTE — ED Notes (Signed)
[facility],RN

## 2018-03-22 NOTE — ED Notes (Signed)
Lunch given to pt by this RN, pt sitting up in bed with no complaints at this time.

## 2018-03-22 NOTE — ED Notes (Signed)
All belongings given to husband - clothing, cell phone, purse and nose piercing.

## 2018-03-22 NOTE — ED Notes (Signed)
Pt provided phone to update husband on plan of care.

## 2018-03-22 NOTE — ED Provider Notes (Signed)
 [facility] Emergency Department Provider Note   ____________________________________________   First MD Initiated Contact with Patient 03/22/18 0548     (approximate)  I have reviewed the triage vital signs and the nursing notes.   HISTORY  Chief Complaint Drug Overdose    HPI Belinda [name] is a 42 y.o. female who comes into the hospital today because she overdosed on her medications.  She states that she took some Klonopin and Vicoprofen.  She reports that she took about 8 Klonopin over 2 hours and then 5-6 Vicoprofen since 2 AM.  The patient states that she wanted to rest and she is not sure if she was trying to kill herself but she reports that she was tempted to continually take the medicines which concerned her.  She states that she got online to a site where people ask for help to see if someone could help talk her into not taking her medicines.  The patient's husband did find out and he brought her into the hospital for evaluation.  The patient states that she did also drink some alcohol.  She is had no nausea or vomiting but has felt a little dizzy and lightheaded.  She is here today for evaluation.   Past Medical History:  Diagnosis Date  . Arthritis    lower back  . Bipolar affective disorder (HCC)   . Deviated septum   . Diabetes mellitus without complication (HCC)   . GERD (gastroesophageal reflux disease)   . Headache(784.0)    migraines - none 2 months  . Peptic ulcer   . Pre-diabetes   . Schizophrenia (HCC)   . Sleep apnea   . Substance abuse (HCC)     Patient Active Problem List   Diagnosis Date Noted  . Controlled substance agreement signed 10/16/2017  . Bipolar affective disorder (HCC) 08/27/2017  . 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 Nausea   . Arthropathy of lumbar facet joint 05/28/2016  . B12 deficiency 02/13/2016  . Neck pain 02/05/2016  . Pain of right arm 02/05/2016  . Right shoulder pain 02/05/2016  .  Bariatric surgery status 02/23/2014  . Headache 02/23/2014  . Diabetes (HCC) 02/21/2014  . Morbid obesity (HCC) 10/14/2011    Past Surgical History:  Procedure Laterality Date  . ESOPHAGOGASTRODUODENOSCOPY  08/05/2012   Procedure: ESOPHAGOGASTRODUODENOSCOPY (EGD);  Surgeon: David H Newman, MD;  Location: [facility];  Service: General;  Laterality: N/A;  . ESOPHAGOGASTRODUODENOSCOPY (EGD) WITH PROPOFOL N/A 08/06/2016   Procedure: ESOPHAGOGASTRODUODENOSCOPY (EGD) WITH PROPOFOL;  Surgeon: Kiran Anna, MD;  Location: [facility];  Service: Endoscopy;  Laterality: N/A;  . ESOPHAGOGASTRODUODENOSCOPY (EGD) WITH PROPOFOL N/A 11/07/2016   Procedure: ESOPHAGOGASTRODUODENOSCOPY (EGD) WITH PROPOFOL;  Surgeon: Kiran Anna, MD;  Location: [facility] END[facility]COPY;  Service: Endoscopy;  Laterality: N/A;  . 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/2011   Procedure: LAPAROSCOPIC ROUX-EN-Y GASTRIC;  Surgeon: Benjamin T Hoxworth, MD;  Location: [facility];  Service: General;  Laterality: N/A;  upper endoscopy  . SEPTOPLASTY  2003    Prior to Admission medications   Medication Sig Start Date End Date Taking? Authorizing Provider  ARIPiprazole (ABILIFY) 30 MG tablet Take 30 mg by mouth at bedtime.  03/06/16  Yes [provider]  buPROPion (WELLBUTRIN XL) 150 MG 24 hr tablet Take 450 mg at bedtime by mouth.    Yes [provider]  celecoxib (CELEBREX) 200 MG capsule Take 1 capsule (200 mg total) by mouth 2 (two) times daily. 06/23/17  Yes Johnson, Megan   P, DO  clonazePAM (KLONOPIN) 0.5 MG tablet Take 0.5 mg 3 (three) times daily as needed by mouth for anxiety.  09/07/16  Yes [provider]  cyanocobalamin (,VITAMIN B-12,) 1000 MCG/ML injection INJECT 1 ML INTRAMUSCULARLY EVERY 2 WEEKS Patient taking differently: Inject 1,000 mcg every 14 (fourteen) days into the muscle.  07/08/17  Yes Johnson, Megan P, DO  hydrocodone-ibuprofen (VICOPROFEN) 5-200 MG tablet Take 1 tablet by mouth every 8 (eight) hours as needed for  pain (severe pain). 03/19/18  Yes Johnson, Megan P, DO  lamoTRIgine (LAMICTAL) 200 MG tablet Take 200 mg daily by mouth.   Yes [provider]  lidocaine (LIDODERM) 5 % Place 1 patch onto the skin daily. Remove & Discard patch within 12 hours or as directed by MD 10/14/17  Yes Lane, Rachel Elizabeth, PA-C  ondansetron (ZOFRAN) 4 MG tablet TAKE 1 TABLET BY MOUTH EVERY 8 HOURS AS NEEDED FOR NAUSEA AND VOMITING 10/28/17  Yes Johnson, Megan P, DO  promethazine (PHENERGAN) 12.5 MG tablet Take 1 tablet (12.5 mg total) by mouth every 6 (six) hours as needed for nausea or vomiting. 11/30/17  Yes Robinson, Patrick, MD  promethazine (PHENERGAN) 25 MG tablet Take 1 tablet (25 mg total) by mouth every 8 (eight) hours as needed for nausea or vomiting. 06/23/17  Yes Johnson, Megan P, DO  SPRINTEC 28 0.25-35 MG-MCG tablet TAKE 1 TABLET BY MOUTH DAILY 02/11/18  Yes Johnson, Megan P, DO  tiZANidine (ZANAFLEX) 4 MG capsule TAKE 1 CAPSULE (4 MG TOTAL) BY MOUTH 3 (THREE) TIMES DAILY AS NEEDED FOR MUSCLE SPASMS. 02/15/18  Yes Johnson, Megan P, DO  B-D 3CC LUER-LOK SYR 25GX1" 25G X 1" 3 ML MISC  02/27/16   [provider]  blood glucose meter kit and supplies KIT Dispense based on patient and insurance preference. Use up to four times daily as directed. (FOR ICD-9 250.00, 250.01). 06/26/15   Johnson, Megan P, DO  [employer] GLUCOSE 4-6 GM-MG CHEW chewable tablet CHEW 3 TABLETS (15 G TOTAL) BY MOUTH AS NEEDED FOR LOW BLOOD SUGAR. 09/08/16   [provider]  glucose blood test strip Use as instructed 09/08/16   Johnson, Megan P, DO  sertraline (ZOLOFT) 100 MG tablet Take 150 mg by mouth daily.     [provider]    Allergies Depakote [divalproex sodium] and Acetaminophen  Family History  Problem Relation Age of Onset  . Diabetes Mother   . Cancer Mother 62       brain, skin  . Cancer Father 60       kidney, bone, rectal  . Hyperlipidemia Father   . Hypertension Father   . Stroke Father   .  Cancer Paternal Aunt        breast  . Diabetes Brother   . Allergies Sister     Social History Social History   Tobacco Use  . Smoking status: Never Smoker  . Smokeless tobacco: Never Used  Substance Use Topics  . Alcohol use: Yes    Alcohol/week: 1.8 oz    Types: 3 Standard drinks or equivalent per week  . Drug use: No    Review of Systems  Constitutional: No fever/chills Eyes: No visual changes. ENT: No sore throat. Cardiovascular: Denies chest pain. Respiratory: Denies shortness of breath. Gastrointestinal: No abdominal pain.  No nausea, no vomiting.  No diarrhea.  No constipation. Genitourinary: Negative for dysuria. Musculoskeletal: Negative for back pain. Skin: Negative for rash. Neurological: Dizzy and lightheaded Psych: Overdose  ____________________________________________     PHYSICAL EXAM:  VITAL SIGNS: ED Triage Vitals  Enc Vitals Group     BP 03/22/18 0506 122/87     Pulse Rate 03/22/18 0506 (!) 116     Resp 03/22/18 0506 16     Temp --      Temp src --      SpO2 03/22/18 0506 96 %     Weight 03/22/18 0553 194 lb (88 kg)     Height 03/22/18 0506 5' 2" (1.575 m)     Head Circumference --      Peak Flow --      Pain Score --      Pain Loc --      Pain Edu? --      Excl. in GC? --     Constitutional: Alert and oriented but somnolent. Well appearing and in moderate distress. Eyes: Conjunctivae are normal. PERRL. EOMI. Head: Atraumatic. Nose: No congestion/rhinnorhea. Mouth/Throat: Mucous membranes are moist.  Oropharynx non-erythematous. Cardiovascular: Normal rate, regular rhythm. Grossly normal heart sounds.  Good peripheral circulation. Respiratory: Normal respiratory effort.  No retractions. Lungs CTAB. Gastrointestinal: Soft and nontender. No distention.  Positive bowel sounds Musculoskeletal: No lower extremity tenderness nor edema.   Neurologic:  Normal speech and language.  Skin:  Skin is warm, dry and intact.  Psychiatric: Mood and  affect are normal.   ____________________________________________   LABS (all labs ordered are listed, but only abnormal results are displayed)  Labs Reviewed  COMPREHENSIVE METABOLIC PANEL - Abnormal; Notable for the following components:      Result Value   Glucose, Bld 109 (*)    Calcium 8.4 (*)    ALT 13 (*)    All other components within normal limits  ETHANOL - Abnormal; Notable for the following components:   Alcohol, Ethyl (B) 14 (*)    All other components within normal limits  ACETAMINOPHEN LEVEL - Abnormal; Notable for the following components:   Acetaminophen (Tylenol), Serum <10 (*)    All other components within normal limits  CBC - Abnormal; Notable for the following components:   Hemoglobin 11.6 (*)    HCT 34.6 (*)    RDW 15.4 (*)    All other components within normal limits  SALICYLATE LEVEL  URINE DRUG SCREEN, QUALITATIVE ([facility] ONLY)  POC URINE PREG, ED   ____________________________________________  EKG  ED ECG REPORT I, Webster,  Allison P, the attending physician, personally viewed and interpreted this ECG.   Date: 03/22/2018  EKG Time: 508  Rate: 101  Rhythm: sinus tachycardia  Axis: normal  Intervals:none  ST&T Change: none  ____________________________________________  RADIOLOGY  ED MD interpretation:  none  Official radiology report(s): No results found.  ____________________________________________   PROCEDURES  Procedure(s) performed: None  Procedures  Critical Care performed: No  ____________________________________________   INITIAL IMPRESSION / ASSESSMENT AND PLAN / ED COURSE  As part of my medical decision making, I reviewed the following data within the electronic medical record:  Notes from prior ED visits and [place] Controlled Substance Database   This is a 42-year-old female who comes into the hospital today after overdosing on her Klonopin and Vicoprofen.  The patient initially told staff in triage that she was  trying to kill herself but then said she is not sure if she was trying to kill her self and she is sorry that she took the medications.  We did check some blood work on the patient to include a CBC, CMP, ethanol, acetaminophen and a salicylate level.    The patient's ethanol is 14 and the patient salicylate and acetaminophen are unremarkable.  Since the patient did take this medication and have thoughts of hurting herself I will involuntarily commit her.  I will have the patient evaluated by TTS and psych.      ____________________________________________   FINAL CLINICAL IMPRESSION(S) / ED DIAGNOSES  Final diagnoses:  Suicidal ideation  Intentional drug overdose, initial encounter [facility])     ED Discharge Orders    None       Note:  This document was prepared using Dragon voice recognition software and may include unintentional dictation errors.    Loney Hering, MD 03/22/18 [phone]

## 2018-03-22 NOTE — ED Notes (Signed)
Tele monitoring established.

## 2018-03-22 NOTE — Progress Notes (Signed)
Belinda [name] is a 42 year old female pt admitted from [facility] hospital. On admission she reports that she took klonopin and vicoprofen but denies that she did it in a suicide attempt. She reports that she was feeling stressed and was in pain and wanted both to go away. She reports that she realized that she took too many and told her husband who then brought her into the hospital. She denies any SI currently and is able to contract for safety in the hospital. She reports that she has been diagnosed with Bipolar and has had 2 previous suicide attempts but reports that she has never been hospitalized. She reports that she sees Dr. Maryruth BunKapur as her psychiatrist and reports that she is taking all her medications as prescribed. She reports that she lives with her husband and reports that she will return there after discharge. She was oriented to the unit and safety maintained.

## 2018-03-22 NOTE — ED Notes (Signed)
Pts husband visiting with patient at this time, pt appears calm, Pt Advocate Shari HeritageMary Jo Miller present for visit, officer aware of visit and remains outside of pt room.

## 2018-03-22 NOTE — ED Notes (Signed)
Tele sitter is set up and pt is aware and understands what is going on. Bed in lowest position, rails up and pt hooked to monitor and call bell placed in pt reach before this tech left the room. Pt is in in bed resting at this time.

## 2018-03-23 DIAGNOSIS — F319 Bipolar disorder, unspecified: Principal | ICD-10-CM

## 2018-03-23 LAB — LIPID PANEL
CHOL/HDL RATIO: 2.4 ratio
Cholesterol: 152 mg/dL (ref 0–200)
HDL: 63 mg/dL (ref >40)
LDL CALC: 72 mg/dL (ref 0–99)
TRIGLYCERIDES: 84 mg/dL (ref <150)
VLDL: 17 mg/dL (ref 0–40)

## 2018-03-23 LAB — HEMOGLOBIN A1C
HEMOGLOBIN A1C: 5.6 % (ref 4.8–5.6)
Mean Plasma Glucose: 114.02 mg/dL

## 2018-03-23 LAB — TSH: TSH: 1.912 u[IU]/mL (ref 0.350–4.500)

## 2018-03-23 MED ORDER — SERTRALINE HCL 100 MG PO TABS
200.0000 mg | ORAL_TABLET | Freq: Every day | ORAL | Status: DC
  Administered 2018-03-23 – 2018-03-25 (×3): 200 mg via ORAL
  Filled 2018-03-23 (×5): qty 2

## 2018-03-23 MED ORDER — ONDANSETRON HCL 4 MG PO TABS: 4.0000 mg | ORAL_TABLET | Freq: Three times a day (TID) | ORAL | Status: DC | PRN

## 2018-03-23 NOTE — Progress Notes (Signed)
D:  Patient's self inventory sheet, patient sleeps good, no sleep medication given.  Fair appetite, low energy level, poor concentration.  Rated depression, hopeless and anxiety #8.  Denied withdrawals.  Denied SI.  Denied physical problems.  Physical pain, L leg, worst pain #4 in past 24 hours.  Goal is to learn schedule.  No discharge plans.   A:  Medications administered per MD orders.  Emotional support and encouragement given patient.   R:  Patient denied SI an HI, contracts for safety.  Denied A/V hallucinations.  Safety maintained with 15 minute checks. Patient was tearful early this morning.   Pa

## 2018-03-23 NOTE — Plan of Care (Signed)
Nurse discussed anxiety, depression, coping skills with patient. 
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
## 2018-03-23 NOTE — [facility] Suicide Risk Assessment (Deleted)
[facility] INPATIENT:  Family/Significant Other Suicide Prevention Education  Suicide Prevention Education:  Education Completed; Belinda [name] [phone]) has been identified by the patient as the family member/significant other with whom the patient will be residing, and identified as the person(s) who will aid the patient in the event of a mental health crisis (suicidal ideations/suicide attempt).  With written consent from the patient, the family member/significant other has been provided the following suicide prevention education, prior to the and/or following the discharge of the patient.  The suicide prevention education provided includes the following:  Suicide risk factors  Suicide prevention and interventions  National Suicide Hotline telephone number  [facility] assessment telephone number  [facility] Emergency Assistance 911  [facility] and/or Residential Mobile Crisis Unit telephone number  Request made of family/significant other to:  Remove weapons (e.g., guns, rifles, knives), all items previously/currently identified as safety concern.    Remove drugs/medications (over-the-counter, prescriptions, illicit drugs), all items previously/currently identified as a safety concern.  The family member/significant other verbalizes understanding of the suicide prevention education information provided.  The family member/significant other agrees to remove the items of safety concern listed above.  Belinda [name] reports he does have a gun, however it is locked/secured in a gun safe and he has hidden the ke. He reports he is the only person who can access the safe. Belinda [name] reports he has no concerns for the patient's safety or the safety of others at discharge.    Belinda [name] 03/23/2018, 10:59 AM
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
## 2018-03-23 NOTE — [facility] Group Notes (Signed)
LCSW Group Therapy Note 03/23/2018 12:54 PM  Type of Therapy/Topic: Group Therapy: Balance in Life  Participation Level: Active  Description of Group:  This group will address the concept of balance and how it feels and looks when one is unbalanced. Patients will be encouraged to process areas in their lives that are out of balance and identify reasons for remaining unbalanced. Facilitators will guide patients in utilizing problem-solving interventions to address and correct the stressor making their life unbalanced. Understanding and applying boundaries will be explored and addressed for obtaining and maintaining a balanced life. Patients will be encouraged to explore ways to assertively make their unbalanced needs known to significant others in their lives, using other group members and facilitator for support and feedback.  Therapeutic Goals: 1. Patient will identify two or more emotions or situations they have that consume much of in their lives. 2. Patient will identify signs/triggers that life has become out of balance:  3. Patient will identify two ways to set boundaries in order to achieve balance in their lives:  4. Patient will demonstrate ability to communicate their needs through discussion and/or role plays  Summary of Patient Progress:  [name] was engaged and participated throughout the group session. [name] states that balance is "having a life that is sustainable". [name] states that fear causes her to become unbalanced, however after she prioritizes her responsibilities she becomes balanced again.     Therapeutic Modalities:  Cognitive Behavioral Therapy Solution-Focused Therapy Assertiveness Training   Alcario DroughtJolan Deniesha Stenglein LCSWA Clinical Social Worker

## 2018-03-23 NOTE — [facility] Group Notes (Signed)
[facility] Group Notes:  (Nursing/MHT/Case Management/Adjunct)  Date:  03/23/2018  Time:  4:00 pm  Type of Therapy:  Psychoeducational Skills  Participation Level:  Active  Participation Quality:  Appropriate  Affect:  Appropriate  Cognitive:  Appropriate  Insight:  Appropriate  Engagement in Group:  Engaged  Modes of Intervention:  Discussion and Education  Summary of Progress/Problems:  Patient was alert and appropriate during today's group. Earline MayotteKnight, Geniene List Shephard 03/23/2018, 5:50 PM

## 2018-03-23 NOTE — [facility] Group Notes (Signed)
Adult Psychoeducational Group Note  Date:  03/23/2018 Time:  9:23 PM  Group Topic/Focus:  Wrap-Up Group:   The focus of this group is to help patients review their daily goal of treatment and discuss progress on daily workbooks.  Participation Level:  Active        Participation Quality:  Appropriate and Attentive  Affect:  Appropriate   Cognitive:  Alert and Appropriate  Insight: Appropriate and Good  Engagement in Group:  Engaged  Modes of Intervention:  Discussion and Education  Additional Comments:  Pt attended and participated in wrap up group this evening. Pt had a rough morning, but they are feeling much better this evening. Pt goal for tomorrow in to do more activities and to work on their workbook.   Chrisandra NettersOctavia A Sharena Dibenedetto 03/23/2018, 9:23 PM

## 2018-03-23 NOTE — Progress Notes (Signed)
D:  Met with Belinda [name] on the unit after her admission.  She denied SI/HI or A/V hallucinations.  She stated that she normally takes tizanidine at bedtime to help with her back but shouldn't need it tonight.  Encouraged her to talk with the MD tomorrow.  She declined any medications at this time and stated she would be fine tomorrow.   She did attend evening group and was appropriate with staff and peers. A:  1:1 with RN for support and encouragement.  Medications offered.  Q 15 minute checks maintained for safety.  Encouraged participation in group and unit activities.   R:  Belinda [name] remains safe on the unit.  We will continue to monitor the progress towards her goals.

## 2018-03-23 NOTE — H&P (Signed)
Psychiatric Admission Assessment Adult  Patient Identification: Belinda [name]  MRN:  [id number]  Date of Evaluation:  03/23/2018  Chief Complaint:  bipolar disorder   Principal Diagnosis: Bipolar affective disorder ([place])  Diagnosis:   Patient Active Problem List   Diagnosis Date Noted  . Bipolar affective disorder ([place]) [F31.9] 08/27/2017    Priority: High  . Bipolar 1 disorder, depressed ([place]) [F31.9] 03/22/2018  . Sedative overdose [T42.71XA] 03/22/2018  . Bipolar 1 disorder ([place]) [F31.9] 03/22/2018  . Controlled substance agreement signed [Z79.899] 10/16/2017  . 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 Nausea [R11.0]   . 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 B12 deficiency [E53.8] 02/13/2016  . Neck pain [M54.2] 02/05/2016  . Pain of right arm [M79.601] 02/05/2016  . Right shoulder pain [M25.511] 02/05/2016  . Bariatric surgery status [Z98.84] 02/23/2014  . Headache [R51] 02/23/2014  . Diabetes ([place]) [E11.9] 02/21/2014  . Morbid obesity ([place]) [E66.01] 10/14/2011   History of Present Illness: (Per Md's admission SRA): Patient is seen and examined.  Patient is a 42 year old female with a past psychiatric history significant for bipolar disorder who was admitted after an intentional overdose of a combination of hydrocodone and clonazepam.  The patient stated that she was taking her medications, and inadvertently took 1 pill after another until she had taken approximately 6 or 7 of both medications.  At that point she got panicked and called poison control who told her to come to the hospital.  Patient stated she was unclear of what went on yesterday.  She denied any new stressors.  She stated that she has had a history of bipolar depression for some time.  She been off her medications for approximately 6 months, and restarted them approximately 4 months ago.  She continues on Zoloft, Lamictal and Wellbutrin.  No previous psychiatric admissions.  She  wants to try to kill herself as a teenager.  She did have a history of cutting herself as a teenager.  She admits to feeling helpless, hopeless and worthless.  She admitted to previous episodes of excessive spending, sexual infidelity, and other manic symptoms.  She was admitted to the hospital for evaluation and stabilization.  Associated Signs/Symptoms:  Depression Symptoms:  depressed mood, hypersomnia, fatigue, feelings of worthlessness/guilt, hopelessness, anxiety,  (Hypo) Manic Symptoms:  Impulsivity, Labiality of Mood,  Anxiety Symptoms:  Excessive Worry,  Psychotic Symptoms:  Denies  PTSD Symptoms: Denies  Total Time spent with patient: 1 hour  Past Psychiatric History: Major depression.  Is the patient at risk to self? No.  Has the patient been a risk to self in the past 6 months? Yes.    Has the patient been a risk to self within the distant past? Yes.    Is the patient a risk to others? No.  Has the patient been a risk to others in the past 6 months? No.  Has the patient been a risk to others within the distant past? No.   Prior Inpatient Therapy: Yes Prior Outpatient Therapy: Yes with Dr. Nicolasa Ducking  Alcohol Screening: 1. How often do you have a drink containing alcohol?: 2 to 3 times a week 2. How many drinks containing alcohol do you have on a typical day when you are drinking?: 1 or 2 3. How often do you have six or more drinks on one occasion?: Weekly AUDIT-C Score: 6 4. How often during the last year have you found that you were not able to stop drinking once you  had started?: Never 5. How often during the last year have you failed to do what was normally expected from you becasue of drinking?: Never 6. How often during the last year have you needed a first drink in the morning to get yourself going after a heavy drinking session?: Never 7. How often during the last year have you had a feeling of guilt of remorse after drinking?: Never 8. How often during the  last year have you been unable to remember what happened the night before because you had been drinking?: Never 9. Have you or someone else been injured as a result of your drinking?: No 10. Has a relative or friend or a doctor or another health worker been concerned about your drinking or suggested you cut down?: No Alcohol Use Disorder Identification Test Final Score (AUDIT): 6 Intervention/Follow-up: AUDIT Score <7 follow-up not indicated  Substance Abuse History in the last 12 months:  Yes.    Consequences of Substance Abuse: Medical Consequences:  Liver damage, Possible death by overdose Legal Consequences:  Arrests, jail time, Loss of driving privilege. Family Consequences:  Family discord, divorce and or separation.  Previous Psychotropic Medications: Yes   Psychological Evaluations: No   Past Medical History:  Past Medical History:  Diagnosis Date  . Arthritis    lower back  . Bipolar affective disorder ([place])   . Deviated septum   . Diabetes mellitus without complication ([place])   . GERD (gastroesophageal reflux disease)   . Headache(784.0)    migraines - none 2 months  . Peptic ulcer   . Pre-diabetes   . Schizophrenia ([place])   . Sleep apnea   . Substance abuse [facility])     Past Surgical History:  Procedure Laterality Date  . ESOPHAGOGASTRODUODENOSCOPY  08/05/2012   Procedure: ESOPHAGOGASTRODUODENOSCOPY (EGD);  Surgeon: Shann Medal, MD;  Location: [facility];  Service: General;  Laterality: N/A;  . ESOPHAGOGASTRODUODENOSCOPY (EGD) WITH PROPOFOL N/A 08/06/2016   Procedure: ESOPHAGOGASTRODUODENOSCOPY (EGD) WITH PROPOFOL;  Surgeon: Jonathon Bellows, MD;  Location: [facility];  Service: Endoscopy;  Laterality: N/A;  . ESOPHAGOGASTRODUODENOSCOPY (EGD) WITH PROPOFOL N/A 11/07/2016   Procedure: ESOPHAGOGASTRODUODENOSCOPY (EGD) WITH PROPOFOL;  Surgeon: Jonathon Bellows, MD;  Location: [facility];  Service: Endoscopy;  Laterality: N/A;  . 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/2011    Procedure: LAPAROSCOPIC ROUX-EN-Y GASTRIC;  Surgeon: Edward Jolly, MD;  Location: [facility];  Service: General;  Laterality: N/A;  upper endoscopy  . SEPTOPLASTY  2003   Family History:  Family History  Problem Relation Age of Onset  . Diabetes Mother   . Cancer Mother 33       brain, skin  . Cancer Father 46       kidney, bone, rectal  . Hyperlipidemia Father   . Hypertension Father   . Stroke Father   . Cancer Paternal Aunt        breast  . Diabetes Brother   . Allergies Sister    Family Psychiatric  History: Bipolar disorder & Completed suicide: Maternal uncle.   Tobacco Screening: Have you used any form of tobacco in the last 30 days? (Cigarettes, Smokeless Tobacco, Cigars, and/or Pipes): No  Social History:  Social History   Substance and Sexual Activity  Alcohol Use Yes  . Alcohol/week: 1.8 oz  . Types: 3 Standard drinks or equivalent per week     Social History   Substance and Sexual Activity  Drug Use Yes  . Types: Marijuana    Additional Social History: Marital  status: Married Number of Years Married: 17 What types of issues is patient dealing with in the relationship?: Patient reports not having any current issues with her husband. She states that they had marital issues last year, however they have worked through their issues by going to marriage counseling.  Additional relationship information: No Are you sexually active?: Yes What is your sexual orientation?: Heterosexual  Has your sexual activity been affected by drugs, alcohol, medication, or emotional stress?: No  Does patient have children?: No   Allergies:   Allergies  Allergen Reactions  . Depakote [Divalproex Sodium] Other (See Comments)    Tremors  . Acetaminophen Nausea Only   Lab Results:  Results for orders placed or performed during the hospital encounter of 03/22/18 (from the past 48 hour(s))  TSH     Status: None   Collection Time: 03/23/18  6:33 AM  Result Value Ref Range    TSH 1.912 0.350 - 4.500 uIU/mL    Comment: Performed by a 3rd Generation assay with a functional sensitivity of <=0.01 uIU/mL. Performed at [facility], [place] [address] [place], [place] [address]   Hemoglobin A1c     Status: None   Collection Time: 03/23/18  6:33 AM  Result Value Ref Range   Hgb A1c MFr Bld 5.6 4.8 - 5.6 %    Comment: (NOTE) Pre diabetes:          5.7%-6.4% Diabetes:              >6.4% Glycemic control for   <7.0% adults with diabetes    Mean Plasma Glucose 114.02 mg/dL    Comment: Performed at [facility] [address] [place], [place] [address]  Lipid panel     Status: None   Collection Time: 03/23/18  6:33 AM  Result Value Ref Range   Cholesterol 152 0 - 200 mg/dL   Triglycerides 84 <150 mg/dL   HDL 63 >40 mg/dL   Total CHOL/HDL Ratio 2.4 RATIO   VLDL 17 0 - 40 mg/dL   LDL Cholesterol 72 0 - 99 mg/dL    Comment:        Total Cholesterol/HDL:CHD Risk Coronary Heart Disease Risk Table                     Men   Women  1/2 Average Risk   3.4   3.3  Average Risk       5.0   4.4  2 X Average Risk   9.6   7.1  3 X Average Risk  23.4   11.0        Use the calculated Patient Ratio above and the CHD Risk Table to determine the patient's CHD Risk.        ATP III CLASSIFICATION (LDL):  <100     mg/dL   Optimal  100-129  mg/dL   Near or Above                    Optimal  130-159  mg/dL   Borderline  160-189  mg/dL   High  >190     mg/dL   Very High Performed at [facility] [address], [place], [place] [address]    Blood Alcohol level:  Lab Results  Component Value Date   ETH 14 (H) 11/94/1740   Metabolic Disorder Labs:  Lab Results  Component Value Date   HGBA1C 5.6 03/23/2018   MPG 114.02 03/23/2018  MPG 120 (H) 07/17/2011   No results found for: PROLACTIN Lab Results  Component Value Date   CHOL 152 03/23/2018   TRIG 84 03/23/2018   HDL 63 03/23/2018   CHOLHDL 2.4 03/23/2018   VLDL 17  03/23/2018   LDLCALC 72 03/23/2018   LDLCALC 67 03/04/2017   Current Medications: Current Facility-Administered Medications  Medication Dose Route Frequency Provider Last Rate Last Dose  . alum & mag hydroxide-simeth (MAALOX/MYLANTA) 200-200-20 MG/5ML suspension 30 mL  30 mL Oral Q4H PRN Derrill Center, NP      . buPROPion (WELLBUTRIN XL) 24 hr tablet 300 mg  300 mg Oral Daily Derrill Center, NP   300 mg at 03/23/18 0757  . hydrOXYzine (ATARAX/VISTARIL) tablet 50 mg  50 mg Oral TID PRN Clapacs, Madie Reno, MD      . lamoTRIgine (LAMICTAL) tablet 200 mg  200 mg Oral Daily Derrill Center, NP   200 mg at 03/23/18 0756  . magnesium hydroxide (MILK OF MAGNESIA) suspension 30 mL  30 mL Oral Daily PRN Derrill Center, NP      . ondansetron (ZOFRAN) tablet 4 mg  4 mg Oral Q8H PRN Sharma Covert, MD      . sertraline (ZOLOFT) tablet 200 mg  200 mg Oral Daily Sharma Covert, MD   200 mg at 03/23/18 9470  . traZODone (DESYREL) tablet 50 mg  50 mg Oral QHS PRN Derrill Center, NP       PTA Medications: Medications Prior to Admission  Medication Sig Dispense Refill Last Dose  . B-D 3CC LUER-LOK SYR 25GX1" 25G X 1" 3 ML MISC    Taking  . blood glucose meter kit and supplies KIT Dispense based on patient and insurance preference. Use up to four times daily as directed. (FOR ICD-9 250.00, 250.01). 1 each 0 Taking  . buPROPion (WELLBUTRIN) 75 MG tablet Take 150 mg by mouth 2 (two) times daily.  0 03/22/2018  . celecoxib (CELEBREX) 200 MG capsule Take 1 capsule (200 mg total) by mouth 2 (two) times daily. (Patient taking differently: Take 200 mg by mouth 2 (two) times daily as needed for mild pain. ) 180 capsule 3 2 weeks ago  . clonazePAM (KLONOPIN) 0.5 MG tablet Take 0.5 mg 3 (three) times daily as needed by mouth for anxiety.    03/22/2018  . cyanocobalamin (,VITAMIN B-12,) 1000 MCG/ML injection INJECT 1 ML INTRAMUSCULARLY EVERY 2 WEEKS (Patient taking differently: Inject 1,000 mcg every 14 (fourteen)  days into the muscle. ) 2 mL 12 03/16/2018  . glucose blood test strip Use as instructed 100 each 12 Taking  . glucose-Vitamin C 4-0.006 GM CHEW chewable tablet Chew 1 tablet by mouth as needed for low blood sugar.    few months ago  . hydrocodone-ibuprofen (VICOPROFEN) 5-200 MG tablet Take 1 tablet by mouth every 8 (eight) hours as needed for pain (severe pain). 30 tablet 0 03/22/2018  . lamoTRIgine (LAMICTAL) 200 MG tablet Take 200 mg daily by mouth.   03/22/2018  . lidocaine (LIDODERM) 5 % Place 1 patch onto the skin daily. Remove & Discard patch within 12 hours or as directed by MD 30 patch 0 03/22/2018  . ondansetron (ZOFRAN) 4 MG tablet TAKE 1 TABLET BY MOUTH EVERY 8 HOURS AS NEEDED FOR NAUSEA AND VOMITING 90 tablet 3 03/22/2018  . OVER THE COUNTER MEDICATION Apply 1 application topically as needed (For leg pain.). doTERRA Deep Blue Rub   03/20/2018  . promethazine (PHENERGAN)  12.5 MG tablet Take 1 tablet (12.5 mg total) by mouth every 6 (six) hours as needed for nausea or vomiting. 12 tablet 0 2 weeks ago  . sertraline (ZOLOFT) 100 MG tablet Take 150 mg by mouth daily.    03/22/2018  . SPRINTEC 28 0.25-35 MG-MCG tablet TAKE 1 TABLET BY MOUTH DAILY 28 tablet 12 03/22/2018   Musculoskeletal: Strength & Muscle Tone: within normal limits Gait & Station: normal Patient leans: N/A  Psychiatric Specialty Exam: Physical Exam  Constitutional: She appears well-developed.  HENT:  Head: Normocephalic.  Eyes: Pupils are equal, round, and reactive to light.  Neck: Normal range of motion.  Cardiovascular: Normal rate.  Respiratory: Effort normal.  GI: Soft.  Genitourinary:  Genitourinary Comments: Deferred  Musculoskeletal: Normal range of motion.  Neurological: She is alert.  Skin: Skin is warm.    Review of Systems  Constitutional: Negative for malaise/fatigue.  HENT: Negative.   Eyes: Negative.   Respiratory: Negative.  Negative for cough and shortness of breath.   Cardiovascular: Negative.   Negative for chest pain and palpitations.  Gastrointestinal: Negative.   Genitourinary: Negative.   Musculoskeletal: Negative.   Skin: Negative.   Neurological: Negative.   Endo/Heme/Allergies: Negative.   Psychiatric/Behavioral: Positive for depression and substance abuse. Negative for hallucinations and memory loss. The patient is nervous/anxious and has insomnia.     Blood pressure 104/79, pulse 80, temperature 98.4 F (36.9 C), temperature source Oral, resp. rate 16, height _0  (1.575 m), weight 89.4 kg (197 lb), last menstrual period 03/21/2018.Body mass index is 36.03 kg/m.  General Appearance: Disheveled  Eye Contact:  Minimal  Speech:  Normal Rate  Volume:  Decreased  Mood:  Depressed  Affect:  Congruent  Thought Process:  Coherent  Orientation:  Full (Time, Place, and Person)  Thought Content:  Logical  Suicidal Thoughts:  No  Homicidal Thoughts:  No  Memory:  Immediate;   Poor  Judgement:  Intact  Insight:  Lacking  Psychomotor Activity:  Decreased  Concentration:  Concentration: Fair and Attention Span: Fair  Recall:  Poor  Fund of Knowledge:  Good  Language:  Good  Akathisia:  No  Handed:  Right  AIMS (if indicated):     Assets:  Communication Skills Desire for Improvement Financial Resources/Insurance Housing Physical Health Resilience Social Support  ADL's:  Intact  Cognition:  WNL  Sleep:  Number of Hours: 6.75     Treatment Plan/Recommendations: 1. Admit for crisis management and stabilization, estimated length of stay 3-5 days.   2. Medication management to reduce current symptoms to base line and improve the patient's overall level of functioning: See MAR, Md's SRA & treatment plan.   Observation Level/Precautions:  15 minute checks  Laboratory:  Per ED, UDS (+) for Barbiturate & opioid, BAL 14  Psychotherapy: Group sessions  Medications: See MAR    Consultations: As needed  Discharge Concerns: Safety, mood stability  Estimated LOS: 5-7 days   Other: Admit to the [facility].    Physician Treatment Plan for Primary Diagnosis: Bipolar affective disorder ([place])  Long Term Goal(s): Improvement in symptoms so as ready for discharge  Short Term Goals: Ability to verbalize feelings will improve  Physician Treatment Plan for Secondary Diagnosis: Principal Problem:   Bipolar affective disorder ([place]) Active Problems:   Bipolar 1 disorder, depressed ([place])   Bipolar 1 disorder ([place])  Long Term Goal(s): Improvement in symptoms so as ready for discharge  Short Term Goals: Ability to identify and develop effective coping behaviors  will improve and Compliance with prescribed medications will improve  I certify that inpatient services furnished can reasonably be expected to improve the patient's condition.    Lindell Spar, NP, PMHNP, FNP-BC 6/18/20193:06 PM

## 2018-03-24 MED ORDER — IBUPROFEN 400 MG PO TABS
400.0000 mg | ORAL_TABLET | Freq: Four times a day (QID) | ORAL | Status: DC | PRN
  Administered 2018-03-24 – 2018-03-25 (×2): 400 mg via ORAL
  Filled 2018-03-24 (×2): qty 1

## 2018-03-24 NOTE — [facility] Group Notes (Signed)
Adult Psychoeducational Group Note  Date:  03/24/2018 Time:  9:06 PM  Group Topic/Focus:  Wrap-Up Group:   The focus of this group is to help patients review their daily goal of treatment and discuss progress on daily workbooks.  Participation Level:  Active  Participation Quality:  Appropriate and Attentive  Affect:  Appropriate  Cognitive:  Alert and Appropriate  Insight: Appropriate and Good  Engagement in Group:  Engaged  Modes of Intervention:  Discussion and Education  Additional Comments:  Pt attended and participated in wrap up group this evening. Pt had a really good day due to them feeling more like themselves. Pt goal was to do more activities and they completed their goal by going to all the groups.   Chrisandra NettersOctavia A Kaori Jumper 03/24/2018, 9:06 PM

## 2018-03-24 NOTE — Progress Notes (Signed)
Patient ID: Belinda LevanSusan D Decoste, female   DOB: 1976/08/16, 42 y.o.   MRN: [id number] D: Patient in dayroom on approach. Mood and affect appeared depressed and flat.  Pt reports she is getting used to her new environment and peers.  pt attended evening wrap up group and engaged in discussion. Denies  SI/HI/AVH and pain.No behavioral issues noted.  A: Support and encouragement offered as needed to express needs.  R: Patient is safe and cooperative on unit. Will continue to monitor  for safety and stability.

## 2018-03-24 NOTE — [facility] Suicide Risk Assessment (Signed)
[facility] INPATIENT:  Family/Significant Other Suicide Prevention Education  Suicide Prevention Education:  Education Completed; Belinda [name], husband [phone]) has been identified by the patient as the family member/significant other with whom the patient will be residing, and identified as the person(s) who will aid the patient in the event of a mental health crisis (suicidal ideations/suicide attempt).  With written consent from the patient, the family member/significant other has been provided the following suicide prevention education, prior to the and/or following the discharge of the patient.  The suicide prevention education provided includes the following:  Suicide risk factors  Suicide prevention and interventions  National Suicide Hotline telephone number  [facility] assessment telephone number  [facility] Emergency Assistance 911  [facility] and/or Residential Mobile Crisis Unit telephone number  Request made of family/significant other to:  Remove weapons (Belinda.g., guns, rifles, knives), all items previously/currently identified as safety concern.    Remove drugs/medications (over-the-counter, prescriptions, illicit drugs), all items previously/currently identified as a safety concern.  The family member/significant other verbalizes understanding of the suicide prevention education information provided.  The family member/significant other agrees to remove the items of safety concern listed above.  Per Belinda [name], he has no concerns for safety at discharge.   Belinda [name] Belinda [name] 03/24/2018, 2:42 PM

## 2018-03-24 NOTE — Progress Notes (Signed)
D: Patient denies SI, HI or AVH this evening. Patient presents as anxious but is other pleasant and cooperative.  Pt. States she has been working on attending all groups and utilizing the information learned.  Pt. Denies any physical complaints, she states she feels better since being restarted on her medication.  Pt. States that she believes she is being discharged tomorrow and appears content with that.  A: Patient given emotional support from RN. Patient encouraged to come to staff with concerns and/or questions. Patient's medication routine continued. Patient's orders and plan of care reviewed.   R: Patient remains appropriate and cooperative. Will continue to monitor patient q15 minutes for safety.

## 2018-03-24 NOTE — Progress Notes (Signed)
Recreation Therapy Notes  Date: 6.19.19 Time: 1000 Location: 300 [address] Dayroom  Group Topic: Stress Management  Goal Area(s) Addresses:  Patient will verbalize importance of using healthy stress management.  Patient will identify positive emotions associated with healthy stress management.   Intervention: Stress Management  Activity : Guided Imagery.  LRT introduced the stress management technique of guided imagery.  LRT read a script that lead patients through the process of taking a mental vacation.  Patients were to follow along as LRT read script to engage in activity.  Education: Stress Management, Discharge Planning.   Education Outcome: Acknowledges edcuation/In group clarification offered/Needs additional education  Clinical Observations/Feedback: Pt did not attend group.    Ehtan Delfavero, LRT/CTRS          Audreanna Torrisi A 03/24/2018 11:55 AM 

## 2018-03-24 NOTE — Tx Team (Signed)
Interdisciplinary Treatment and Diagnostic Plan Update  03/24/2018 Time of Session: [place] MRN: [id number]  Principal Diagnosis: Bipolar affective disorder [facility])  Secondary Diagnoses: Principal Problem:   Bipolar affective disorder ([place]) Active Problems:   Bipolar 1 disorder, depressed ([place])   Bipolar 1 disorder ([place])   Current Medications:  Current Facility-Administered Medications  Medication Dose Route Frequency Provider Last Rate Last Dose  . alum & mag hydroxide-simeth (MAALOX/MYLANTA) 200-200-20 MG/5ML suspension 30 mL  30 mL Oral Q4H PRN Derrill Center, NP      . buPROPion (WELLBUTRIN XL) 24 hr tablet 300 mg  300 mg Oral Daily Derrill Center, NP   300 mg at 03/24/18 0758  . hydrOXYzine (ATARAX/VISTARIL) tablet 50 mg  50 mg Oral TID PRN Clapacs, John T, MD      . lamoTRIgine (LAMICTAL) tablet 200 mg  200 mg Oral Daily Derrill Center, NP   200 mg at 03/24/18 0758  . magnesium hydroxide (MILK OF MAGNESIA) suspension 30 mL  30 mL Oral Daily PRN Derrill Center, NP      . ondansetron (ZOFRAN) tablet 4 mg  4 mg Oral Q8H PRN Sharma Covert, MD      . sertraline (ZOLOFT) tablet 200 mg  200 mg Oral Daily Sharma Covert, MD   200 mg at 03/24/18 0758  . traZODone (DESYREL) tablet 50 mg  50 mg Oral QHS PRN Derrill Center, NP       PTA Medications: Medications Prior to Admission  Medication Sig Dispense Refill Last Dose  . B-D 3CC LUER-LOK SYR 25GX1" 25G X 1" 3 ML MISC    Taking  . blood glucose meter kit and supplies KIT Dispense based on patient and insurance preference. Use up to four times daily as directed. (FOR ICD-9 250.00, 250.01). 1 each 0 Taking  . buPROPion (WELLBUTRIN) 75 MG tablet Take 150 mg by mouth 2 (two) times daily.  0 03/22/2018  . celecoxib (CELEBREX) 200 MG capsule Take 1 capsule (200 mg total) by mouth 2 (two) times daily. (Patient taking differently: Take 200 mg by mouth 2 (two) times daily as needed for mild pain. ) 180 capsule 3 2 weeks  ago  . clonazePAM (KLONOPIN) 0.5 MG tablet Take 0.5 mg 3 (three) times daily as needed by mouth for anxiety.    03/22/2018  . cyanocobalamin (,VITAMIN B-12,) 1000 MCG/ML injection INJECT 1 ML INTRAMUSCULARLY EVERY 2 WEEKS (Patient taking differently: Inject 1,000 mcg every 14 (fourteen) days into the muscle. ) 2 mL 12 03/16/2018  . glucose blood test strip Use as instructed 100 each 12 Taking  . glucose-Vitamin C 4-0.006 GM CHEW chewable tablet Chew 1 tablet by mouth as needed for low blood sugar.    few months ago  . hydrocodone-ibuprofen (VICOPROFEN) 5-200 MG tablet Take 1 tablet by mouth every 8 (eight) hours as needed for pain (severe pain). 30 tablet 0 03/22/2018  . lamoTRIgine (LAMICTAL) 200 MG tablet Take 200 mg daily by mouth.   03/22/2018  . lidocaine (LIDODERM) 5 % Place 1 patch onto the skin daily. Remove & Discard patch within 12 hours or as directed by MD 30 patch 0 03/22/2018  . ondansetron (ZOFRAN) 4 MG tablet TAKE 1 TABLET BY MOUTH EVERY 8 HOURS AS NEEDED FOR NAUSEA AND VOMITING 90 tablet 3 03/22/2018  . OVER THE COUNTER MEDICATION Apply 1 application topically as needed (For leg pain.). doTERRA Deep Blue Rub   03/20/2018  . promethazine (PHENERGAN) 12.5 MG  tablet Take 1 tablet (12.5 mg total) by mouth every 6 (six) hours as needed for nausea or vomiting. 12 tablet 0 2 weeks ago  . sertraline (ZOLOFT) 100 MG tablet Take 150 mg by mouth daily.    03/22/2018  . SPRINTEC 28 0.25-35 MG-MCG tablet TAKE 1 TABLET BY MOUTH DAILY 28 tablet 12 03/22/2018    Patient Stressors: Marital or family conflict Medication change or noncompliance  Patient Strengths: Ability for insight Average or above average intelligence Capable of independent living General fund of knowledge Motivation for treatment/growth Supportive family/friends  Treatment Modalities: Medication Management, Group therapy, Case management,  1 to 1 session with clinician, Psychoeducation, Recreational therapy.   Physician  Treatment Plan for Primary Diagnosis: Bipolar affective disorder ([place]) Long Term Goal(s): Improvement in symptoms so as ready for discharge Improvement in symptoms so as ready for discharge   Short Term Goals: Ability to verbalize feelings will improve Ability to identify and develop effective coping behaviors will improve Compliance with prescribed medications will improve  Medication Management: Evaluate patient's response, side effects, and tolerance of medication regimen.  Therapeutic Interventions: 1 to 1 sessions, Unit Group sessions and Medication administration.  Evaluation of Outcomes: Progressing  Physician Treatment Plan for Secondary Diagnosis: Principal Problem:   Bipolar affective disorder ([place]) Active Problems:   Bipolar 1 disorder, depressed ([place])   Bipolar 1 disorder ([place])  Long Term Goal(s): Improvement in symptoms so as ready for discharge Improvement in symptoms so as ready for discharge   Short Term Goals: Ability to verbalize feelings will improve Ability to identify and develop effective coping behaviors will improve Compliance with prescribed medications will improve     Medication Management: Evaluate patient's response, side effects, and tolerance of medication regimen.  Therapeutic Interventions: 1 to 1 sessions, Unit Group sessions and Medication administration.  Evaluation of Outcomes: Progressing   RN Treatment Plan for Primary Diagnosis: Bipolar affective disorder ([place]) Long Term Goal(s): Knowledge of disease and therapeutic regimen to maintain health will improve  Short Term Goals: Ability to identify and develop effective coping behaviors will improve and Compliance with prescribed medications will improve  Medication Management: RN will administer medications as ordered by provider, will assess and evaluate patient's response and provide education to patient for prescribed medication. RN will report any adverse and/or side effects to  prescribing provider.  Therapeutic Interventions: 1 on 1 counseling sessions, Psychoeducation, Medication administration, Evaluate responses to treatment, Monitor vital signs and CBGs as ordered, Perform/monitor CIWA, COWS, AIMS and Fall Risk screenings as ordered, Perform wound care treatments as ordered.  Evaluation of Outcomes: Progressing   LCSW Treatment Plan for Primary Diagnosis: Bipolar affective disorder ([place]) Long Term Goal(s): Safe transition to appropriate next level of care at discharge, Engage patient in therapeutic group addressing interpersonal concerns.  Short Term Goals: Engage patient in aftercare planning with referrals and resources, Increase social support and Increase skills for wellness and recovery  Therapeutic Interventions: Assess for all discharge needs, 1 to 1 time with Social worker, Explore available resources and support systems, Assess for adequacy in community support network, Educate family and significant other(s) on suicide prevention, Complete Psychosocial Assessment, Interpersonal group therapy.  Evaluation of Outcomes: Progressing   Progress in Treatment: Attending groups: Yes. Participating in groups: Yes. Taking medication as prescribed: Yes. Toleration medication: Yes. Family/Significant other contact made: No, will contact:  husband Patient understands diagnosis: Yes. Discussing patient identified problems/goals with staff: Yes. Medical problems stabilized or resolved: Yes. Denies suicidal/homicidal ideation: Yes. Issues/concerns per patient self-inventory:  No. Other: none  New problem(s) identified: No, Describe:  none  New Short Term/Long Term Goal(s):  Patient Goals:  "To address the root cause of my bad thoughts"  Discharge Plan or Barriers:   Reason for Continuation of Hospitalization: Depression Medication stabilization  Estimated Length of Stay: 2-4 days.  Attendees: Patient: Belinda [name] 03/24/2018   Physician: Dr Myles Lipps, MD 03/24/2018   Nursing: Mayra Neer, RN 03/24/2018   RN Care Manager: 03/24/2018   Social Worker: Lurline Idol, LCSW 03/24/2018   Recreational Therapist:  03/24/2018   Other:  03/24/2018   Other:  03/24/2018   Other: 03/24/2018     Scribe for Treatment Team: Joanne Chars, LCSW 03/24/2018 10:54 AM

## 2018-03-24 NOTE — [facility] Group Notes (Signed)
[facility] Group Therapy 03/24/2018 1:15pm  Type of Therapy: Mental Health Association Presentation  Participation Level: Active  Participation Quality: Attentive  Affect: Appropriate  Cognitive: Oriented  Insight: Developing/Improving  Engagement in Therapy: Engaged  Modes of Intervention: Discussion, Education and Socialization  Summary of Progress/Problems: Mental Health Association (MHA) Speaker came to talk about his personal journey with mental health. The pt processed ways by which to relate to the speaker. MHA speaker provided handouts and educational information pertaining to groups and services offered by the [facility]. Pt was engaged in speaker's presentation and was receptive to resources provided.    Rona RavensHeather S Thedford Bunton, LCSW 03/24/2018 11:15 AM

## 2018-03-24 NOTE — Progress Notes (Signed)
[facility] MD Progress Note  03/24/2018 2:35 PM Belinda [name]  MRN:  [id number] Subjective: Patient is seen and examined.  Patient is a 42 year old female with a past psychiatric history significant for bipolar disorder who is seen in follow-up.  She is doing better today.  Her mood is improved.  She slept a little bit better last night.  She stated she had a good visit last night with her boyfriend as well as her sister.  She stated that went well.  She denied any suicidal ideation.  She denied any side effects to her current medications.  Her vital signs are stable, she is afebrile, but she did have a headache this a.m.  She continues on Wellbutrin XL 300 mg p.o. daily, Lamictal 200 mg p.o. daily, and sertraline 200 mg p.o. daily. Principal Problem: Bipolar affective disorder [facility]) Diagnosis:   Patient Active Problem List   Diagnosis Date Noted  . Bipolar 1 disorder, depressed (HCC) [F31.9] 03/22/2018  . Sedative overdose [T42.71XA] 03/22/2018  . Bipolar 1 disorder (HCC) [F31.9] 03/22/2018  . Controlled substance agreement signed [Z79.899] 10/16/2017  . Bipolar affective disorder (HCC) [F31.9] 08/27/2017  . 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 Nausea [R11.0]   . 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 B12 deficiency [E53.8] 02/13/2016  . Neck pain [M54.2] 02/05/2016  . Pain of right arm [M79.601] 02/05/2016  . Right shoulder pain [M25.511] 02/05/2016  . Bariatric surgery status [Z98.84] 02/23/2014  . Headache [R51] 02/23/2014  . Diabetes (HCC) [E11.9] 02/21/2014  . Morbid obesity (HCC) [E66.01] 10/14/2011   Total Time spent with patient: 20 minutes  Past Psychiatric History: See admission H&P  Past Medical History:  Past Medical History:  Diagnosis Date  . Arthritis    lower back  . Bipolar affective disorder (HCC)   . Deviated septum   . Diabetes mellitus without complication (HCC)   . GERD (gastroesophageal reflux disease)   . Headache(784.0)    migraines - none 2 months  . Peptic ulcer   . Pre-diabetes   . Schizophrenia (HCC)   . Sleep apnea   . Substance abuse [facility])     Past Surgical History:  Procedure Laterality Date  . ESOPHAGOGASTRODUODENOSCOPY  08/05/2012   Procedure: ESOPHAGOGASTRODUODENOSCOPY (EGD);  Surgeon: Kandis Cocking, MD;  Location: [facility];  Service: General;  Laterality: N/A;  . ESOPHAGOGASTRODUODENOSCOPY (EGD) WITH PROPOFOL N/A 08/06/2016   Procedure: ESOPHAGOGASTRODUODENOSCOPY (EGD) WITH PROPOFOL;  Surgeon: Wyline Mood, MD;  [facility];  Service: Endoscopy;  Laterality: N/A;  . ESOPHAGOGASTRODUODENOSCOPY (EGD) WITH PROPOFOL N/A 11/07/2016   Procedure: ESOPHAGOGASTRODUODENOSCOPY (EGD) WITH PROPOFOL;  Surgeon: Wyline Mood, MD;  Location: [facility] ENDOSCOPY;  Service: Endoscopy;  Laterality: N/A;  . 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/2011   Procedure: LAPAROSCOPIC ROUX-EN-Y GASTRIC;  Surgeon: Mariella Saa, MD;  Location: [facility] ORS;  Service: General;  Laterality: N/A;  upper endoscopy  . SEPTOPLASTY  2003   Family History:  Family History  Problem Relation Age of Onset  . Diabetes Mother   . Cancer Mother 14       brain, skin  . Cancer Father 63       kidney, bone, rectal  . Hyperlipidemia Father   . Hypertension Father   . Stroke Father   . Cancer Paternal Aunt        breast  . Diabetes Brother   . Allergies Sister    Family Psychiatric  History: See admission H&P Social History:  Social History  Substance and Sexual Activity  Alcohol Use Yes  . Alcohol/week: 1.8 oz  . Types: 3 Standard drinks or equivalent per week     Social History   Substance and Sexual Activity  Drug Use Yes  . Types: Marijuana    Social History   Socioeconomic History  . Marital status: Married    Spouse name: Not on file  . Number of children: Not on file  . Years of education: Not on file  . Highest education level: Not on file  Occupational History  . Not on file  Social Needs  . Financial  resource strain: Not on file  . Food insecurity:    Worry: Not on file    Inability: Not on file  . Transportation needs:    Medical: Not on file    Non-medical: Not on file  Tobacco Use  . Smoking status: Never Smoker  . Smokeless tobacco: Never Used  Substance and Sexual Activity  . Alcohol use: Yes    Alcohol/week: 1.8 oz    Types: 3 Standard drinks or equivalent per week  . Drug use: Yes    Types: Marijuana  . Sexual activity: Yes    Birth control/protection: None  Lifestyle  . Physical activity:    Days per week: Not on file    Minutes per session: Not on file  . Stress: Not on file  Relationships  . Social connections:    Talks on phone: Not on file    Gets together: Not on file    Attends religious service: Not on file    Active member of club or organization: Not on file    Attends meetings of clubs or organizations: Not on file    Relationship status: Not on file  Other Topics Concern  . Not on file  Social History Narrative  . Not on file   Additional Social History:                         Sleep: Fair  Appetite:  Good  Current Medications: Current Facility-Administered Medications  Medication Dose Route Frequency Provider Last Rate Last Dose  . alum & mag hydroxide-simeth (MAALOX/MYLANTA) 200-200-20 MG/5ML suspension 30 mL  30 mL Oral Q4H PRN Oneta Rack, NP      . buPROPion (WELLBUTRIN XL) 24 hr tablet 300 mg  300 mg Oral Daily Oneta Rack, NP   300 mg at 03/24/18 0758  . hydrOXYzine (ATARAX/VISTARIL) tablet 50 mg  50 mg Oral TID PRN Clapacs, John T, MD      . ibuprofen (ADVIL,MOTRIN) tablet 400 mg  400 mg Oral Q6H PRN Antonieta Pert, MD   400 mg at 03/24/18 1124  . lamoTRIgine (LAMICTAL) tablet 200 mg  200 mg Oral Daily Oneta Rack, NP   200 mg at 03/24/18 0758  . magnesium hydroxide (MILK OF MAGNESIA) suspension 30 mL  30 mL Oral Daily PRN Oneta Rack, NP      . ondansetron (ZOFRAN) tablet 4 mg  4 mg Oral Q8H PRN Antonieta Pert, MD      . sertraline (ZOLOFT) tablet 200 mg  200 mg Oral Daily Antonieta Pert, MD   200 mg at 03/24/18 0758  . traZODone (DESYREL) tablet 50 mg  50 mg Oral QHS PRN Oneta Rack, NP        Lab Results:  Results for orders placed or performed during the hospital encounter of 03/22/18 (from  the past 48 hour(s))  TSH     Status: None   Collection Time: 03/23/18  6:33 AM  Result Value Ref Range   TSH 1.912 0.350 - 4.500 uIU/mL    Comment: Performed by a 3rd Generation assay with a functional sensitivity of <=0.01 uIU/mL. Performed at [facility], [address] [place], [place] [address]   Hemoglobin A1c     Status: None   Collection Time: 03/23/18  6:33 AM  Result Value Ref Range   Hgb A1c MFr Bld 5.6 4.8 - 5.6 %    Comment: (NOTE) Pre diabetes:          5.7%-6.4% Diabetes:              >6.4% Glycemic control for   <7.0% adults with diabetes    Mean Plasma Glucose 114.02 mg/dL    Comment: Performed at [facility], [address] [place], [place] [address]  Lipid panel     Status: None   Collection Time: 03/23/18  6:33 AM  Result Value Ref Range   Cholesterol 152 0 - 200 mg/dL   Triglycerides 84 <295<150 mg/dL   HDL 63 >28>40 mg/dL   Total CHOL/HDL Ratio 2.4 RATIO   VLDL 17 0 - 40 mg/dL   LDL Cholesterol 72 0 - 99 mg/dL    Comment:        Total Cholesterol/HDL:CHD Risk Coronary Heart Disease Risk Table                     Men   Women  1/2 Average Risk   3.4   3.3  Average Risk       5.0   4.4  2 X Average Risk   9.6   7.1  3 X Average Risk  23.4   11.0        Use the calculated Patient Ratio above and the CHD Risk Table to determine the patient's CHD Risk.        ATP III CLASSIFICATION (LDL):  <100     mg/dL   Optimal  413-244100-129  mg/dL   Near or Above                    Optimal  130-159  mg/dL   Borderline  [id number]  mg/dL   High  >536>190     mg/dL   Very High Performed at [facility], [address]  [place], [place] [address]     Blood Alcohol level:  Lab Results  Component Value Date   ETH 14 (H) 03/22/2018    Metabolic Disorder Labs: Lab Results  Component Value Date   HGBA1C 5.6 03/23/2018   MPG 114.02 03/23/2018   MPG 120 (H) 07/17/2011   No results found for: PROLACTIN Lab Results  Component Value Date   CHOL 152 03/23/2018   TRIG 84 03/23/2018   HDL 63 03/23/2018   CHOLHDL 2.4 03/23/2018   VLDL 17 03/23/2018   LDLCALC 72 03/23/2018   LDLCALC 67 03/04/2017    Physical Findings: AIMS: Facial and Oral Movements Muscles of Facial Expression: None, normal Lips and Perioral Area: None, normal Jaw: None, normal Tongue: None, normal,Extremity Movements Upper (arms, wrists, hands, fingers): None, normal Lower (legs, knees, ankles, toes): None, normal, Trunk Movements Neck, shoulders, hips: None, normal, Overall Severity Severity of abnormal movements (highest score from questions above): None, normal Incapacitation due to abnormal movements: None, normal Patient's awareness of abnormal movements (  rate only patient's report): No Awareness, Dental Status Current problems with teeth and/or dentures?: No Does patient usually wear dentures?: No  CIWA:  CIWA-Ar Total: 1 COWS:  COWS Total Score: 1  Musculoskeletal: Strength & Muscle Tone: within normal limits Gait & Station: normal Patient leans: N/A  Psychiatric Specialty Exam: Physical Exam  Constitutional: She is oriented to person, place, and time. She appears well-developed and well-nourished.  HENT:  Head: Normocephalic and atraumatic.  Respiratory: Effort normal.  Neurological: She is alert and oriented to person, place, and time.    ROS  Blood pressure 112/80, pulse 75, temperature 98.2 F (36.8 C), temperature source Oral, resp. rate 12, height 5\' 2"  (1.575 m), weight 89.4 kg (197 lb), last menstrual period 03/21/2018.Body mass index is 36.03 kg/m.  General Appearance: Casual  Eye Contact:  Good  Speech:   Clear and Coherent  Volume:  Normal  Mood:  Euthymic  Affect:  Appropriate  Thought Process:  Coherent  Orientation:  Full (Time, Place, and Person)  Thought Content:  Logical  Suicidal Thoughts:  No  Homicidal Thoughts:  No  Memory:  Immediate;   Fair Recent;   Fair Remote;   Fair  Judgement:  Intact  Insight:  Fair  Psychomotor Activity:  Normal  Concentration:  Concentration: Fair and Attention Span: Fair  Recall:  [employer] of Knowledge:  Good  Language:  Good  Akathisia:  Negative  Handed:  Right  AIMS (if indicated):     Assets:  Desire for Improvement Housing Resilience Social Support  ADL's:  Intact  Cognition:  WNL  Sleep:  Number of Hours: 6     Treatment Plan Summary: Daily contact with patient to assess and evaluate symptoms and progress in treatment, Medication management and Plan Patient is seen and examined.  Patient is a 42 year old female with the above-stated past psychiatric history seen in follow-up.  She continues to slowly improve.  No change in her medications today.  If things continue to do well I would anticipate discharge in 1 to 2 days.  Antonieta Pert, MD 03/24/2018, 2:35 PM

## 2018-03-24 NOTE — Progress Notes (Signed)
D: Patient presents with flat, blunted affect; her speech if soft, logical.  She is future oriented.  She denies any depressive symptoms; she rates her anxiety as 3.  Her goal today is to "do more activities."  Her only complaint today is a slight headache and she was given 400 mg ibuprofen.  She reports she is sleeping and eating well; her energy level is normal and her concentration is good.  She denies any thoughts of self harm.  She is observed in the day room interacting with her peers.  She is attending groups and participating in her treatment.  A: Continue to monitor medication management and MD orders.  Safety checks completed every 15 minutes per protocol.  Offer support and encouragement as needed.  R: Patient is receptive to staff; her behavior is appropriate.

## 2018-03-24 NOTE — Therapy (Signed)
Occupational Therapy Group Treatment Note  Date:  03/24/2018 Time:  3:00 PM  Group Topic/Focus:  Stress Management  Participation Level:  Active  Participation Quality:  Appropriate and Sharing  Affect:  Flat  Cognitive:  Appropriate  Insight: Improving  Engagement in Group:  Engaged  Modes of Intervention:  Activity, Discussion, Education and Socialization  Additional Comments:     S: "I have been using meditation and apps to help manage stress, but still haven't mastered it"  O: Stress management group completed to use as productive coping strategy, to help mitigate maladaptive coping to integrate in functional BADL/IADL. Education given on the definition of stress and its cognitive, behavioral, emotional, and physical effects on the body. Stress symptom checklist completed. Stress management tool worksheet discussed to educate on unhealthy vs healthy coping skills to manage stress to improve community integration. Education given on use of progressive muscle relaxation. PMR script delivered with relaxing music to facilitate relaxation response to help increase ability to engage in BADL. Coloring and relaxation guide handouts given at the end of the session.   A: Pt presents to group with flat affect, improving to laughter and smiles with other group members by end of session. Pt initiating conversation, offering examples, and relating to other group members. Pt completed stress symptom checklist, showing moderate signs of stress. Pt engaged in stress management tools worksheet, stating she suppresses her emotions. She would like to continue using the "Calm app" and other relaxation strategies this date, and scheduling "me time" in each daily routine. Pt engaged in PMR script, with much enjoyment and significant relaxation response. She states she is eager to use this in common practice. Pt eager to acquire additional handouts at end of session.  P: Pt provided with education on  stress management activities to implement into daily routine. Handouts given to facilitate carryover when reintegrating into community.    Dalphine HandingKaylee Yolonda Purtle, MSOT, OTR/L  [place] Tryniti Laatsch 03/24/2018, 3:00 PM

## 2018-03-24 NOTE — Plan of Care (Signed)
  Problem: Education: Goal: Knowledge of [facility] Education information/materials will improve Outcome: Progressing Goal: Emotional status will improve Outcome: Progressing Goal: Mental status will improve Outcome: Progressing Goal: Verbalization of understanding the information provided will improve Outcome: Progressing   Problem: Health Behavior/Discharge Planning: Goal: Identification of resources available to assist in meeting health care needs will improve Outcome: Progressing   Problem: Health Behavior/Discharge Planning: Goal: Ability to manage health-related needs will improve Outcome: Progressing   Problem: Coping: Goal: Level of anxiety will decrease Outcome: Progressing   Problem: Medication: Goal: Compliance with prescribed medication regimen will improve Outcome: Adequate for Discharge   Problem: Self-Concept: Goal: Ability to disclose and discuss suicidal ideas will improve Outcome: Adequate for Discharge   Problem: Safety: Goal: Ability to remain free from injury will improve Outcome: Adequate for Discharge

## 2018-03-25 MED ORDER — SERTRALINE HCL 100 MG PO TABS: 200.0000 mg | ORAL_TABLET | Freq: Every day | ORAL | 0 refills | Status: DC

## 2018-03-25 MED ORDER — TRAZODONE HCL 50 MG PO TABS: 50.0000 mg | ORAL_TABLET | Freq: Every evening | ORAL | 0 refills | Status: DC | PRN

## 2018-03-25 MED ORDER — HYDROXYZINE HCL 50 MG PO TABS: 50.0000 mg | ORAL_TABLET | Freq: Three times a day (TID) | ORAL | 0 refills | Status: DC | PRN

## 2018-03-25 MED ORDER — LAMOTRIGINE 200 MG PO TABS: 200.0000 mg | ORAL_TABLET | Freq: Every day | ORAL | 0 refills | Status: DC

## 2018-03-25 MED ORDER — BUPROPION HCL ER (XL) 300 MG PO TB24: 300.0000 mg | ORAL_TABLET | Freq: Every day | ORAL | 0 refills | Status: DC

## 2018-03-25 NOTE — Progress Notes (Signed)
  [facility] :  Will you be returning to the same living situation after discharge:  Yes,  patient is returning home with her husband At discharge, do you have transportation home?: Yes,  patient reports her husband is picking her up at discharge Do you have the ability to pay for your medications: No. Patient recently lost insurance coverage due to quitting her job, patient states that her husband's financial support is her only income currently.   Release of information consent forms completed and in the chart;  Patient's signature needed at discharge.  Patient to Follow up at: Follow-up Information    Darliss RidgelKapur, Aarti K, MD. Go on 03/29/2018.   Specialty:  Psychiatry Why:  Appointment for medication management is Monday, 03/29/18 at 12:00pm. Please be sure to bring any discharge paperwork from the hospital.  Contact information: [address] [place] [id number] [phone]        [facility] Go on 03/26/2018.   Why:  Appointment  is Friday, 03/26/18 at 8:00am. If you cannot make this date and time, please be aware that these are "Open Access" hours,which is M,W, F from 8am-3pm. Follow up within 3 days of discharge and have your ID, SSN, and any discharge paperwork.  Contact information: [address] [place] [id number] [phone]           Next level of care provider has access to [facility] Link:yes  Safety Planning and Suicide Prevention discussed: Yes,  with the patient's husband  Have you used any form of tobacco in the last 30 days? (Cigarettes, Smokeless Tobacco, Cigars, and/or Pipes): No  Has patient been referred to the Quitline?: N/A patient is not a smoker  Patient has been referred for addiction treatment: N/A  Maeola SarahJolan E Stefanie Hodgens, LCSWA 03/25/2018, 10:09 AM

## 2018-03-25 NOTE — [facility] Suicide Risk Assessment (Signed)
[facility] Discharge Suicide Risk Assessment   Principal Problem: Bipolar affective disorder [facility]) Discharge Diagnoses:  Patient Active Problem List   Diagnosis Date Noted  . Bipolar 1 disorder, depressed (HCC) [F31.9] 03/22/2018  . Sedative overdose [T42.71XA] 03/22/2018  . Bipolar 1 disorder (HCC) [F31.9] 03/22/2018  . Controlled substance agreement signed [Z79.899] 10/16/2017  . Bipolar affective disorder (HCC) [F31.9] 08/27/2017  . 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 Nausea [R11.0]   . 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 B12 deficiency [E53.8] 02/13/2016  . Neck pain [M54.2] 02/05/2016  . Pain of right arm [M79.601] 02/05/2016  . Right shoulder pain [M25.511] 02/05/2016  . Bariatric surgery status [Z98.84] 02/23/2014  . Headache [R51] 02/23/2014  . Diabetes (HCC) [E11.9] 02/21/2014  . Morbid obesity (HCC) [E66.01] 10/14/2011    Total Time spent with patient: 30 minutes  Musculoskeletal: Strength & Muscle Tone: within normal limits Gait & Station: normal Patient leans: N/A  Psychiatric Specialty Exam: Review of Systems  All other systems reviewed and are negative.   Blood pressure 130/89, pulse 72, temperature 98.3 F (36.8 C), temperature source Oral, resp. rate 16, height 5\' 2"  (1.575 m), weight 89.4 kg (197 lb), last menstrual period 03/21/2018.Body mass index is 36.03 kg/m.  General Appearance: Casual  Eye Contact::  Good  Speech:  Clear and Coherent409  Volume:  Normal  Mood:  Euthymic  Affect:  Appropriate  Thought Process:  Coherent  Orientation:  Full (Time, Place, and Person)  Thought Content:  Logical  Suicidal Thoughts:  No  Homicidal Thoughts:  No  Memory:  Immediate;   Good Recent;   Good Remote;   Good  Judgement:  Intact  Insight:  Good  Psychomotor Activity:  Normal  Concentration:  Good  Recall:  Good  Fund of Knowledge:Good  Language: Good  Akathisia:  Negative  Handed:  Right  AIMS (if indicated):      Assets:  Communication Skills Desire for Improvement Financial Resources/Insurance Housing Physical Health Resilience Social Support  Sleep:  Number of Hours: 6  Cognition: WNL  ADL's:  Intact   Mental Status Per Nursing Assessment::   On Admission:  Suicidal ideation indicated by patient, Self-harm thoughts, Self-harm behaviors  Demographic Factors:  Caucasian  Loss Factors: NA  Historical Factors: Impulsivity  Risk Reduction Factors:   Sense of responsibility to family, Living with another person, especially a relative and Positive social support  Continued Clinical Symptoms:  Bipolar Disorder:   Depressive phase  Cognitive Features That Contribute To Risk:  None    Suicide Risk:  Minimal: No identifiable suicidal ideation.  Patients presenting with no risk factors but with morbid ruminations; may be classified as minimal risk based on the severity of the depressive symptoms  Follow-up Information    Darliss Ridgel, MD. Go on 03/29/2018.   Specialty:  Psychiatry Why:  Appointment for medication management is Monday, 03/29/18 at 12:00pm. Please be sure to bring any discharge paperwork from the hospital.  Contact information: [address] [place] [address] [phone]        [facility] Go on 03/26/2018.   Why:  Appointment  is Friday, 03/26/18 at 8:00am. If you cannot make this date and time, please be aware that these are "Open Access" hours,which is M,W, F from 8am-3pm. Follow up within 3 days of discharge and have your ID, SSN, and any discharge paperwork.  Contact information: [address] [place] [address] [phone]  Plan Of Care/Follow-up recommendations:  Activity:  ad lib  Antonieta PertGreg Lawson Dru Laurel, MD 03/25/2018, 7:36 AM

## 2018-03-25 NOTE — Discharge Summary (Signed)
Physician Discharge Summary Note  Patient:  Belinda [name] is an 42 y.o., female MRN:  [id number] DOB:  Sep 12, 1976 Patient phone:  [phone] (home)  Patient address:   [place] [address],  Total Time spent with patient: 30 minutes  Date of Admission:  03/22/2018 Date of Discharge: 03/25/2018  Reason for Admission: intentional overdose of a combination of hydrocodone and clonazepam.     Principal Problem: Bipolar affective disorder [facility]) Discharge Diagnoses: Patient Active Problem List   Diagnosis Date Noted  . Bipolar 1 disorder, depressed ([place]) [F31.9] 03/22/2018  . Sedative overdose [T42.71XA] 03/22/2018  . Bipolar 1 disorder ([place]) [F31.9] 03/22/2018  . Controlled substance agreement signed [Z79.899] 10/16/2017  . Bipolar affective disorder ([place]) [F31.9] 08/27/2017  . 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 Nausea [R11.0]   . 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 B12 deficiency [E53.8] 02/13/2016  . Neck pain [M54.2] 02/05/2016  . Pain of right arm [M79.601] 02/05/2016  . Right shoulder pain [M25.511] 02/05/2016  . Bariatric surgery status [Z98.84] 02/23/2014  . Headache [R51] 02/23/2014  . Diabetes ([place]) [E11.9] 02/21/2014  . Morbid obesity ([place]) [E66.01] 10/14/2011    Past Psychiatric History: Major depression    Past Medical History:  Past Medical History:  Diagnosis Date  . Arthritis    lower back  . Bipolar affective disorder ([place])   . Deviated septum   . Diabetes mellitus without complication ([place])   . GERD (gastroesophageal reflux disease)   . Headache(784.0)    migraines - none 2 months  . Peptic ulcer   . Pre-diabetes   . Schizophrenia ([place])   . Sleep apnea   . Substance abuse [facility])     Past Surgical History:  Procedure Laterality Date  . ESOPHAGOGASTRODUODENOSCOPY  08/05/2012   Procedure: ESOPHAGOGASTRODUODENOSCOPY (EGD);  Surgeon: Shann Medal, MD;  Location: [facility];  Service:  General;  Laterality: N/A;  . ESOPHAGOGASTRODUODENOSCOPY (EGD) WITH PROPOFOL N/A 08/06/2016   Procedure: ESOPHAGOGASTRODUODENOSCOPY (EGD) WITH PROPOFOL;  Surgeon: Jonathon Bellows, MD;  Location: [place];  Service: Endoscopy;  Laterality: N/A;  . ESOPHAGOGASTRODUODENOSCOPY (EGD) WITH PROPOFOL N/A 11/07/2016   Procedure: ESOPHAGOGASTRODUODENOSCOPY (EGD) WITH PROPOFOL;  Surgeon: Jonathon Bellows, MD;  Location: [facility];  Service: Endoscopy;  Laterality: N/A;  . 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 SEPTOPLASTY  2003   Family History:  Family History  Problem Relation Age of Onset  . Diabetes Mother   . Cancer Mother 33       brain, skin  . Cancer Father 8       kidney, bone, rectal  . Hyperlipidemia Father   . Hypertension Father   . Stroke Father   . Cancer Paternal Aunt        breast  . Diabetes Brother   . Allergies Sister    Family Psychiatric  History: Bipolar disorder & Completed suicide: Maternal uncle.   Social History:  Social History   Substance and Sexual Activity  Alcohol Use Yes  . Alcohol/week: 1.8 oz  . Types: 3 Standard drinks or equivalent per week     Social History   Substance and Sexual Activity  Drug Use Yes  . Types: Marijuana    Social History   Socioeconomic History  . Marital status: Married    Spouse name: Not on file  .  Number of children: Not on file  . Years of education: Not on file  . Highest education level: Not on file  Occupational History  . Not on file  Social Needs  . Financial resource strain: Not on file  . Food insecurity:    Worry: Not on file    Inability: Not on file  . Transportation needs:    Medical: Not on file    Non-medical: Not on file  Tobacco Use  . Smoking status: Never Smoker  . Smokeless tobacco: Never Used  Substance and Sexual Activity  . Alcohol  use: Yes    Alcohol/week: 1.8 oz    Types: 3 Standard drinks or equivalent per week  . Drug use: Yes    Types: Marijuana  . Sexual activity: Yes    Birth control/protection: None  Lifestyle  . Physical activity:    Days per week: Not on file    Minutes per session: Not on file  . Stress: Not on file  Relationships  . Social connections:    Talks on phone: Not on file    Gets together: Not on file    Attends religious service: Not on file    Active member of club or organization: Not on file    Attends meetings of clubs or organizations: Not on file    Relationship status: Not on file  Other Topics Concern  . Not on file  Social History Narrative  . Not on file    Hospital Course: Patient is seen and examined. Patient is a 42 year old female with a past psychiatric history significant for bipolar disorder who was admitted after an intentional overdose of a combination of hydrocodone and clonazepam. The patient stated that she was taking her medications, and inadvertently took 1 pill after another until she had taken approximately 6 or 7 of both medications. At that point she got panicked and called poison control who told her to come to the hospital. Patient stated she was unclear of what went on yesterday. She denied any new stressors. She stated that she has had a history of bipolar depression for some time. She been off her medications for approximately 6 months, and restarted them approximately 4 months ago. She continues on Zoloft, Lamictal and Wellbutrin. No previous psychiatric admissions. She wants to try to kill herself as a teenager. She did have a history of cutting herself as a teenager. She admits to feeling helpless, hopeless and worthless. She admitted to previous episodes of excessive spending, sexual infidelity, and other manic symptoms. She was admitted to the hospital for evaluation and stabilization.   After the above admission assessment and during this  hospital course, patients presenting symptoms were identified. Labs were reviewed and her UDS was positive for opiates. Ethanol prior to admission was 14. Her HgbA1c, lipid panel and TSH were normal.. Detoxification treatments administered as approproiate. Patient was treated and discharged with the following medications; Wellbutrin XL 300 mg po daily for depression, Vistaril 50 mg po TID as needed for anxiety, Lamictal 200 mg po daily for mood stabilization, Zoloft 200 mg po daily for depression Trazodone 50 mg po daily PRN at bedtime for insomnia, Patient tolerated her treatment regimen without any adverse effects reported. She remained compliant with therapeutic milieu and actively participated in group counseling sessions. AA/NA meetings were offered & held on the unit with patient active participation. While on the unit, patient was able to verbalize learned coping skills for better management of depression and suicidal thoughts and  to better maintain these thoughts and symptoms when returning home.  During the course of her hospitalization, patient mental health state continued to  continues to slowly improve. Improvement of patients condition was monitored by observation and patients daily report of symptom reduction, presentation of good affect, and overall improvement in mood & behavior.Upon discharge, Shatona denied any SI/HI, AVH, delusional thoughts, or paranoia. She endorsed overall improvement in anxiety. She denied  any substance withdrawal symptoms.  Prior to discharge, Janal's case was presented during treatment team meeting this morning. The team members were all in agreement that she was both mentally & medically stable to be discharged to continue mental health care on an outpatient basis as noted below. She was provided with all the necessary information needed to make this appointment without problems.She was provided with prescriptions  of her [facility] discharge medications to resume as  prescribed following discharge. She left [facility] with all personal belongings in no apparent distress. Transportation per patients arrangement.  Physical Findings: AIMS: Facial and Oral Movements Muscles of Facial Expression: None, normal Lips and Perioral Area: None, normal Jaw: None, normal Tongue: None, normal,Extremity Movements Upper (arms, wrists, hands, fingers): None, normal Lower (legs, knees, ankles, toes): None, normal, Trunk Movements Neck, shoulders, hips: None, normal, Overall Severity Severity of abnormal movements (highest score from questions above): None, normal Incapacitation due to abnormal movements: None, normal Patient's awareness of abnormal movements (rate only patient's report): No Awareness, Dental Status Current problems with teeth and/or dentures?: No Does patient usually wear dentures?: No  CIWA:  CIWA-Ar Total: 1 COWS:  COWS Total Score: 1  Musculoskeletal: Strength & Muscle Tone: within normal limits Gait & Station: normal Patient leans: N/A  Psychiatric Specialty Exam: SEE SRA BY MD  Physical Exam  Nursing note and vitals reviewed. Constitutional: She is oriented to person, place, and time.  Neurological: She is alert and oriented to person, place, and time.    Review of Systems  Psychiatric/Behavioral: Negative for hallucinations, memory loss, substance abuse and suicidal ideas. Depression: improved. Nervous/anxious: improved. Insomnia: improved.     Blood pressure 130/89, pulse 72, temperature 98.3 F (36.8 C), temperature source Oral, resp. rate 16, height '5\' 2"'$  (1.575 m), weight 89.4 kg (197 lb), last menstrual period 03/21/2018.Body mass index is 36.03 kg/m.   Have you used any form of tobacco in the last 30 days? (Cigarettes, Smokeless Tobacco, Cigars, and/or Pipes): No  Has this patient used any form of tobacco in the last 30 days? (Cigarettes, Smokeless Tobacco, Cigars, and/or Pipes)  N/A  Blood Alcohol level:  Lab Results  Component  Value Date   ETH 14 (H) 60/63/0160    Metabolic Disorder Labs:  Lab Results  Component Value Date   HGBA1C 5.6 03/23/2018   MPG 114.02 03/23/2018   MPG 120 (H) 07/17/2011   No results found for: PROLACTIN Lab Results  Component Value Date   CHOL 152 03/23/2018   TRIG 84 03/23/2018   HDL 63 03/23/2018   CHOLHDL 2.4 03/23/2018   VLDL 17 03/23/2018   LDLCALC 72 03/23/2018   LDLCALC 67 03/04/2017    See Psychiatric Specialty Exam and Suicide Risk Assessment completed by Attending Physician prior to discharge.  Discharge destination:  Home  Is patient on multiple antipsychotic therapies at discharge:  No   Has Patient had three or more failed trials of antipsychotic monotherapy by history:  No  Recommended Plan for Multiple Antipsychotic Therapies: NA   Allergies as of 03/25/2018      Reactions  Depakote [divalproex Sodium] Other (See Comments)   Tremors   Acetaminophen Nausea Only      Medication List    STOP taking these medications   buPROPion 75 MG tablet Commonly known as:  WELLBUTRIN Replaced by:  buPROPion 300 MG 24 hr tablet   OVER THE COUNTER MEDICATION     TAKE these medications     Indication  B-D 3CC LUER-LOK SYR 25GX1" 25G X 1" 3 ML Misc Generic drug:  SYRINGE-NEEDLE (DISP) 3 ML  Indication:  DM   blood glucose meter kit and supplies Kit Dispense based on patient and insurance preference. Use up to four times daily as directed. (FOR ICD-9 250.00, 250.01).  Indication:  Type 2 DM   buPROPion 300 MG 24 hr tablet Commonly known as:  WELLBUTRIN XL Take 1 tablet (300 mg total) by mouth daily. Start taking on:  03/26/2018 Replaces:  buPROPion 75 MG tablet  Indication:  Major Depressive Disorder   celecoxib 200 MG capsule Commonly known as:  CELEBREX Take 1 capsule (200 mg total) by mouth 2 (two) times daily. What changed:    when to take this  reasons to take this  Indication:  Acute Pain   clonazePAM 0.5 MG tablet Commonly known as:   KLONOPIN Take 0.5 mg 3 (three) times daily as needed by mouth for anxiety.  Indication:  anxiety   cyanocobalamin 1000 MCG/ML injection Commonly known as:  (VITAMIN B-12) INJECT 1 ML INTRAMUSCULARLY EVERY 2 WEEKS What changed:    how much to take  how to take this  when to take this  additional instructions  Indication:  Inadequate Vitamin B12   glucose blood test strip Use as instructed  Indication:  DM   glucose-Vitamin C 4-0.006 GM Chew chewable tablet Chew 1 tablet by mouth as needed for low blood sugar.  Indication:  vitamin   hydrocodone-ibuprofen 5-200 MG tablet Commonly known as:  VICOPROFEN Take 1 tablet by mouth every 8 (eight) hours as needed for pain (severe pain).  Indication:  Pain   hydrOXYzine 50 MG tablet Commonly known as:  ATARAX/VISTARIL Take 1 tablet (50 mg total) by mouth 3 (three) times daily as needed for anxiety.  Indication:  Feeling Anxious   lamoTRIgine 200 MG tablet Commonly known as:  LAMICTAL Take 1 tablet (200 mg total) by mouth daily.  Indication:  mood stabilization   lidocaine 5 % Commonly known as:  LIDODERM Place 1 patch onto the skin daily. Remove & Discard patch within 12 hours or as directed by MD  Indication:  neuralagia   ondansetron 4 MG tablet Commonly known as:  ZOFRAN TAKE 1 TABLET BY MOUTH EVERY 8 HOURS AS NEEDED FOR NAUSEA AND VOMITING  Indication:  Nausea and Vomiting   promethazine 12.5 MG tablet Commonly known as:  PHENERGAN Take 1 tablet (12.5 mg total) by mouth every 6 (six) hours as needed for nausea or vomiting.  Indication:  Nausea and Vomiting   sertraline 100 MG tablet Commonly known as:  ZOLOFT Take 2 tablets (200 mg total) by mouth daily. Start taking on:  03/26/2018 What changed:  how much to take  Indication:  Major Depressive Disorder   SPRINTEC 28 0.25-35 MG-MCG tablet Generic drug:  norgestimate-ethinyl estradiol TAKE 1 TABLET BY MOUTH DAILY  Indication:  mentural diffuclties    traZODone 50 MG tablet Commonly known as:  DESYREL Take 1 tablet (50 mg total) by mouth at bedtime as needed for sleep.  Indication:  Trouble Sleeping  Follow-up Information    Chauncey Mann, MD. Go on 03/29/2018.   Specialty:  Psychiatry Why:  Appointment for medication management is Monday, 03/29/18 at 12:00pm. Please be sure to bring any discharge paperwork from the hospital.  Contact information: [address] [address] [phone]        [place] on 03/26/2018.   Why:  Appointment  is Friday, 03/26/18 at 8:00am. If you cannot make this date and time, please be aware that these are "Open Access" hours,which is M,W, F from 8am-3pm. Follow up within 3 days of discharge and have your ID, SSN, and any discharge paperwork.  Contact information: [place] [address] [phone]           Follow-up recommendations:  Follow up with your outpatient provided for any medical issues. Activity & diet as recommended by your primary care provider.  Comments:  Patient is instructed prior to discharge to: Take all medications as prescribed by his/her mental healthcare provider. Report any adverse effects and or reactions from the medicines to his/her outpatient provider promptly. Patient has been instructed & cautioned: To not engage in alcohol and or illegal drug use while on prescription medicines. In the event of worsening symptoms, patient is instructed to call the crisis hotline, 911 and or go to the nearest ED for appropriate evaluation and treatment of symptoms. To follow-up with his/her primary care provider for your other medical issues, concerns and or health care needs.  Signed: Mordecai Maes, NP 03/25/2018, 10:43 AM

## 2018-03-25 NOTE — Progress Notes (Signed)
Pt discharged to lobby. Pt was stable and appreciative at that time. All papers, samples and prescriptions were given and valuables returned. Verbal understanding expressed. Denies SI/HI and A/VH. Pt given opportunity to express concerns and ask questions.  

## 2018-04-07 ENCOUNTER — Encounter: Payer: Self-pay | Admitting: Family Medicine

## 2018-04-19 ENCOUNTER — Ambulatory Visit: Payer: 59 | Admitting: Family Medicine

## 2018-04-23 ENCOUNTER — Ambulatory Visit: Payer: Self-pay | Admitting: Family Medicine

## 2018-06-09 ENCOUNTER — Ambulatory Visit: Payer: Self-pay | Admitting: Family Medicine

## 2018-06-29 ENCOUNTER — Ambulatory Visit: Payer: Self-pay | Admitting: Family Medicine

## 2018-06-30 ENCOUNTER — Encounter: Payer: Self-pay | Admitting: Family Medicine

## 2018-10-03 ENCOUNTER — Other Ambulatory Visit: Payer: Self-pay | Admitting: Family Medicine

## 2018-10-18 ENCOUNTER — Ambulatory Visit: Payer: Self-pay | Admitting: Family Medicine

## 2018-10-18 ENCOUNTER — Encounter: Payer: Self-pay | Admitting: Family Medicine

## 2018-10-18 VITALS — BP 106/70 | HR 80 | Temp 98.1°F | Wt 200.9 lb

## 2018-10-18 DIAGNOSIS — F319 Bipolar disorder, unspecified: Secondary | ICD-10-CM

## 2018-10-18 DIAGNOSIS — R52 Pain, unspecified: Secondary | ICD-10-CM

## 2018-10-18 DIAGNOSIS — M47816 Spondylosis without myelopathy or radiculopathy, lumbar region: Secondary | ICD-10-CM

## 2018-10-18 DIAGNOSIS — T4272XD Poisoning by unspecified antiepileptic and sedative-hypnotic drugs, intentional self-harm, subsequent encounter: Secondary | ICD-10-CM

## 2018-10-18 DIAGNOSIS — E119 Type 2 diabetes mellitus without complications: Secondary | ICD-10-CM

## 2018-10-18 DIAGNOSIS — J111 Influenza due to unidentified influenza virus with other respiratory manifestations: Secondary | ICD-10-CM

## 2018-10-18 DIAGNOSIS — E538 Deficiency of other specified B group vitamins: Secondary | ICD-10-CM

## 2018-10-18 DIAGNOSIS — R69 Illness, unspecified: Secondary | ICD-10-CM

## 2018-10-18 LAB — VERITOR FLU A/B WAIVED
INFLUENZA A: NEGATIVE
Influenza B: NEGATIVE

## 2018-10-18 MED ORDER — BENZONATATE 200 MG PO CAPS: 200.0000 mg | ORAL_CAPSULE | Freq: Two times a day (BID) | ORAL | 0 refills | Status: DC | PRN

## 2018-10-18 MED ORDER — CYANOCOBALAMIN 1000 MCG/ML IJ SOLN: 1000.0000 ug | INTRAMUSCULAR | 1 refills | Status: DC

## 2018-10-18 NOTE — Assessment & Plan Note (Signed)
Advised her to follow back up with psychiatry. Has been off all her medicine. Not working- this has made things easier on her. Call with any concerns. Checking labs today.

## 2018-10-18 NOTE — Progress Notes (Signed)
BP 106/70   Pulse 80   Temp 98.1 F (36.7 C) (Oral)   Wt 200 lb 14.4 oz (91.1 kg)   LMP 10/13/2018 (Exact Date)   SpO2 98%   BMI 36.75 kg/m    Subjective:    Patient ID: Belinda [name], female    DOB: May 09, 1976, 43 y.o.   MRN: [id number]  HPI: Belinda [name] is a 43 y.o. female  Chief Complaint  Patient presents with  . URI    pt states she has been having a fever, cough, congestion, headache, and sinus pressure    UPPER RESPIRATORY TRACT INFECTION Duration: 5 days Worst symptom: fever 104 Fever: yes Cough: yes Shortness of breath: no Wheezing: no Chest pain: no Chest tightness: no Chest congestion: yes Nasal congestion: no Runny nose: no Post nasal drip: no Sneezing: no Sore throat: no Swollen glands: no Sinus pressure: no Headache: yes Face pain: no Toothache: no Ear pain: yes left Ear pressure: no  Eyes red/itching:no Eye drainage/crusting: no  Vomiting: yes Rash: no Fatigue: yes Sick contacts: yes Strep contacts: no  Context: better Recurrent sinusitis: no Relief with OTC cold/cough medications: no  Treatments attempted: none  Has been doing yoga to help her back- has been doing well with it.   Has not been on any medicine for her mood recently, has been doing better. Not working, so feeling better. Still seeing Dr. Maryruth Bun  Relevant past medical, surgical, family and social history reviewed and updated as indicated. Interim medical history since our last visit reviewed. Allergies and medications reviewed and updated.  Review of Systems  Constitutional: Positive for chills, diaphoresis, fatigue and fever. Negative for activity change, appetite change and unexpected weight change.  HENT: Negative.   Respiratory: Positive for cough, chest tightness, shortness of breath and wheezing. Negative for apnea, choking and stridor.   Cardiovascular: Negative.   Gastrointestinal: Negative.   Musculoskeletal: Negative.   Neurological: Negative.     Psychiatric/Behavioral: Negative.     Per HPI unless specifically indicated above     Objective:    BP 106/70   Pulse 80   Temp 98.1 F (36.7 C) (Oral)   Wt 200 lb 14.4 oz (91.1 kg)   LMP 10/13/2018 (Exact Date)   SpO2 98%   BMI 36.75 kg/m   Wt Readings from Last 3 Encounters:  10/18/18 200 lb 14.4 oz (91.1 kg)  03/22/18 194 lb (88 kg)  03/15/18 194 lb 9.6 oz (88.3 kg)    Physical Exam Vitals signs and nursing note reviewed.  Constitutional:      General: She is not in acute distress.    Appearance: Normal appearance. She is not ill-appearing, toxic-appearing or diaphoretic.  HENT:     Head: Normocephalic and atraumatic.     Right Ear: External ear normal.     Left Ear: External ear normal.     Nose: Nose normal.     Mouth/Throat:     Mouth: Mucous membranes are moist.     Pharynx: Oropharynx is clear.  Eyes:     General: No scleral icterus.       Right eye: No discharge.        Left eye: No discharge.     Extraocular Movements: Extraocular movements intact.     Conjunctiva/sclera: Conjunctivae normal.     Pupils: Pupils are equal, round, and reactive to light.  Neck:     Musculoskeletal: Normal range of motion and neck supple.  Cardiovascular:     Rate  and Rhythm: Normal rate and regular rhythm.     Pulses: Normal pulses.     Heart sounds: Normal heart sounds. No murmur. No friction rub. No gallop.   Pulmonary:     Effort: Pulmonary effort is normal. No respiratory distress.     Breath sounds: Normal breath sounds. No stridor. No wheezing, rhonchi or rales.  Chest:     Chest wall: No tenderness.  Musculoskeletal: Normal range of motion.  Skin:    General: Skin is warm and dry.     Capillary Refill: Capillary refill takes less than 2 seconds.     Coloration: Skin is not jaundiced or pale.     Findings: No bruising, erythema, lesion or rash.  Neurological:     General: No focal deficit present.     Mental Status: She is alert and oriented to person,  place, and time. Mental status is at baseline.  Psychiatric:        Mood and Affect: Mood normal.        Behavior: Behavior normal.        Thought Content: Thought content normal.        Judgment: Judgment normal.     Results for orders placed or performed during the hospital encounter of 03/22/18  Comprehensive metabolic panel  Result Value Ref Range   Sodium 135 135 - 145 mmol/L   Potassium 4.2 3.5 - 5.1 mmol/L   Chloride 104 101 - 111 mmol/L   CO2 22 22 - 32 mmol/L   Glucose, Bld 109 (H) 65 - 99 mg/dL   BUN 13 6 - 20 mg/dL   Creatinine, Ser 9.600.67 0.44 - 1.00 mg/dL   Calcium 8.4 (L) 8.9 - 10.3 mg/dL   Total Protein 6.9 6.5 - 8.1 g/dL   Albumin 3.8 3.5 - 5.0 g/dL   AST 18 15 - 41 U/L   ALT 13 (L) 14 - 54 U/L   Alkaline Phosphatase 78 38 - 126 U/L   Total Bilirubin 0.4 0.3 - 1.2 mg/dL   GFR calc non Af Amer >60 >60 mL/min   GFR calc Af Amer >60 >60 mL/min   Anion gap 9 5 - 15  Ethanol  Result Value Ref Range   Alcohol, Ethyl (B) 14 (H) <10 mg/dL  Salicylate level  Result Value Ref Range   Salicylate Lvl <7.0 2.8 - 30.0 mg/dL  Acetaminophen level  Result Value Ref Range   Acetaminophen (Tylenol), Serum <10 (L) 10 - 30 ug/mL  cbc  Result Value Ref Range   WBC 9.7 3.6 - 11.0 K/uL   RBC 4.13 3.80 - 5.20 MIL/uL   Hemoglobin 11.6 (L) 12.0 - 16.0 g/dL   HCT 45.434.6 (L) 09.835.0 - 11.947.0 %   MCV 83.8 80.0 - 100.0 fL   MCH 28.1 26.0 - 34.0 pg   MCHC 33.5 32.0 - 36.0 g/dL   RDW 14.715.4 (H) 82.911.5 - 56.214.5 %   Platelets 325 150 - 440 K/uL  Urine Drug Screen, Qualitative  Result Value Ref Range   Tricyclic, Ur Screen NONE DETECTED NONE DETECTED   Amphetamines, Ur Screen NONE DETECTED NONE DETECTED   MDMA (Ecstasy)Ur Screen NONE DETECTED NONE DETECTED   Cocaine Metabolite,Ur [place] NONE DETECTED NONE DETECTED   Opiate, Ur Screen POSITIVE (A) NONE DETECTED   Phencyclidine (PCP) Ur S NONE DETECTED NONE DETECTED   Cannabinoid 50 Ng, Ur [place] NONE DETECTED NONE DETECTED   Barbiturates, Ur Screen (A)  NONE DETECTED    Result not available. Reagent  lot number recalled by manufacturer.   Benzodiazepine, Ur Scrn NONE DETECTED NONE DETECTED   Methadone Scn, Ur NONE DETECTED NONE DETECTED  Pregnancy, urine POC  Result Value Ref Range   Preg Test, Ur NEGATIVE NEGATIVE      Assessment & Plan:   Problem List Items Addressed This Visit      Endocrine   RESOLVED: Diabetes (HCC)    A1c 5.7. Has been in normal range for >2 years. Will resolve off her list.       Relevant Orders   Bayer DCA Hb A1c Waived   Comprehensive metabolic panel   Lipid Panel w/o Chol/HDL Ratio   Microalbumin, Urine Waived   TSH   UA/M w/rflx Culture, Routine     Musculoskeletal and Integument   Arthritis of lumbar spine    No pain medicine from this office again. Has been doing yoga, doing better. Declines refill of celebrex.         Other   B12 deficiency    Stable on current regimen. Rechecking levels today. Call with any concerns. Refills given.       Relevant Orders   CBC with Differential/Platelet   Comprehensive metabolic panel   TSH   UA/M w/rflx Culture, Routine   B12 and Folate Panel   Bipolar 1 disorder, depressed (HCC)    Advised her to follow back up with psychiatry. Has been off all her medicine. Not working- this has made things easier on her. Call with any concerns. Checking labs today.      Relevant Orders   Comprehensive metabolic panel   Lipid Panel w/o Chol/HDL Ratio   TSH   UA/M w/rflx Culture, Routine   Sedative overdose    Has been doing better since then. Has not followed up with [place] or psychiatry since this. Strongly advised her to follow up with psychiatry. No controlled substances from this office.        Other Visit Diagnoses    Influenza-like illness    -  Primary   Flu negative, however, I don't believe that. Likely flu. Symptomatic treatment. Lungs clear. Call if getting worse or not getting better.    Body aches       Flu negative, but concern for false  negative.    Relevant Orders   Veritor Flu A/B Waived       Follow up plan: Return in about 4 weeks (around 11/15/2018) for Physical.

## 2018-10-18 NOTE — Assessment & Plan Note (Signed)
A1c 5.7. Has been in normal range for >2 years. Will resolve off her list.

## 2018-10-18 NOTE — Assessment & Plan Note (Signed)
Stable on current regimen. Rechecking levels today. Call with any concerns. Refills given.

## 2018-10-18 NOTE — Assessment & Plan Note (Signed)
No pain medicine from this office again. Has been doing yoga, doing better. Declines refill of celebrex.

## 2018-10-18 NOTE — Assessment & Plan Note (Signed)
Has been doing better since then. Has not followed up with us or psychiatry since this. Strongly advised her to follow up with psychiatry. No controlled substances from this office.

## 2018-10-19 LAB — CBC WITH DIFFERENTIAL/PLATELET
BASOS: 0 %
Basophils Absolute: 0 10*3/uL (ref 0.0–0.2)
EOS (ABSOLUTE): 0 10*3/uL (ref 0.0–0.4)
EOS: 0 %
HEMATOCRIT: 37.4 % (ref 34.0–46.6)
Hemoglobin: 12.1 g/dL (ref 11.1–15.9)
Immature Grans (Abs): 0 10*3/uL (ref 0.0–0.1)
Immature Granulocytes: 0 %
Lymphocytes Absolute: 2.4 10*3/uL (ref 0.7–3.1)
Lymphs: 28 %
MCH: 26.9 pg (ref 26.6–33.0)
MCHC: 32.4 g/dL (ref 31.5–35.7)
MCV: 83 fL (ref 79–97)
MONOS ABS: 0.4 10*3/uL (ref 0.1–0.9)
Monocytes: 5 %
Neutrophils Absolute: 5.9 10*3/uL (ref 1.4–7.0)
Neutrophils: 67 %
Platelets: 218 10*3/uL (ref 150–450)
RBC: 4.5 x10E6/uL (ref 3.77–5.28)
RDW: 14.4 % (ref 11.7–15.4)
WBC: 8.8 10*3/uL (ref 3.4–10.8)

## 2018-10-19 LAB — COMPREHENSIVE METABOLIC PANEL
ALBUMIN: 4.2 g/dL (ref 3.5–5.5)
ALT: 10 IU/L (ref 0–32)
AST: 17 IU/L (ref 0–40)
Albumin/Globulin Ratio: 1.6 (ref 1.2–2.2)
Alkaline Phosphatase: 61 IU/L (ref 39–117)
BUN / CREAT RATIO: 11 (ref 9–23)
BUN: 8 mg/dL (ref 6–24)
Bilirubin Total: <0.2 mg/dL (ref 0.0–1.2)
CALCIUM: 8.9 mg/dL (ref 8.7–10.2)
CO2: 17 mmol/L — ABNORMAL LOW (ref 20–29)
CREATININE: 0.7 mg/dL (ref 0.57–1.00)
Chloride: 102 mmol/L (ref 96–106)
GFR, EST AFRICAN AMERICAN: 124 mL/min/{1.73_m2} (ref >59)
GFR, EST NON AFRICAN AMERICAN: 107 mL/min/{1.73_m2} (ref >59)
Globulin, Total: 2.6 g/dL (ref 1.5–4.5)
Glucose: 70 mg/dL (ref 65–99)
Potassium: 4.1 mmol/L (ref 3.5–5.2)
Sodium: 141 mmol/L (ref 134–144)
TOTAL PROTEIN: 6.8 g/dL (ref 6.0–8.5)

## 2018-10-19 LAB — B12 AND FOLATE PANEL
Folate: 11.9 ng/mL (ref >3.0)
VITAMIN B 12: 530 pg/mL (ref 232–1245)

## 2018-10-19 LAB — LIPID PANEL W/O CHOL/HDL RATIO
Cholesterol, Total: 114 mg/dL (ref 100–199)
HDL: 50 mg/dL (ref >39)
LDL CALC: 42 mg/dL (ref 0–99)
Triglycerides: 111 mg/dL (ref 0–149)
VLDL CHOLESTEROL CAL: 22 mg/dL (ref 5–40)

## 2018-10-19 LAB — TSH: TSH: 2.12 u[IU]/mL (ref 0.450–4.500)

## 2018-10-25 LAB — UA/M W/RFLX CULTURE, ROUTINE
Bilirubin, UA: NEGATIVE
GLUCOSE, UA: NEGATIVE
Ketones, UA: NEGATIVE
NITRITE UA: NEGATIVE
PH UA: 7.5 (ref 5.0–7.5)
Protein, UA: NEGATIVE
Specific Gravity, UA: 1.015 (ref 1.005–1.030)
UUROB: 2 mg/dL — AB (ref 0.2–1.0)

## 2018-10-25 LAB — MICROALBUMIN, URINE WAIVED
CREATININE, URINE WAIVED: 100 mg/dL (ref 10–300)
Microalb, Ur Waived: 30 mg/L — ABNORMAL HIGH (ref 0–19)

## 2018-10-25 LAB — MICROSCOPIC EXAMINATION: RBC, UA: NONE SEEN /hpf (ref 0–2)

## 2018-10-25 LAB — URINE CULTURE, REFLEX

## 2018-10-25 LAB — BAYER DCA HB A1C WAIVED: HB A1C: 5.7 % (ref <7.0)

## 2019-01-26 ENCOUNTER — Encounter: Payer: Self-pay | Admitting: Family Medicine

## 2019-01-31 ENCOUNTER — Encounter: Payer: Self-pay | Admitting: Family Medicine

## 2019-02-01 ENCOUNTER — Encounter: Payer: Self-pay | Admitting: Family Medicine

## 2019-02-01 ENCOUNTER — Other Ambulatory Visit: Payer: Self-pay

## 2019-02-01 ENCOUNTER — Ambulatory Visit (INDEPENDENT_AMBULATORY_CARE_PROVIDER_SITE_OTHER): Payer: Self-pay | Admitting: Family Medicine

## 2019-02-01 DIAGNOSIS — E538 Deficiency of other specified B group vitamins: Secondary | ICD-10-CM

## 2019-02-01 DIAGNOSIS — M436 Torticollis: Secondary | ICD-10-CM

## 2019-02-01 MED ORDER — CYCLOBENZAPRINE HCL 10 MG PO TABS: 10.0000 mg | ORAL_TABLET | Freq: Every day | ORAL | 0 refills | Status: DC

## 2019-02-01 MED ORDER — CYANOCOBALAMIN 1000 MCG/ML IJ SOLN: 1000.0000 ug | INTRAMUSCULAR | 1 refills | Status: DC

## 2019-02-01 NOTE — Progress Notes (Signed)
There were no vitals taken for this visit.   Subjective:    Patient ID: Belinda [name], female    DOB: Aug 25, 1976, 43 y.o.   MRN: [id number]  HPI: Belinda [name] is a 43 y.o. female  Chief Complaint  Patient presents with  . Anemia  . Neck Pain  . Shoulder Pain   ANEMIA- has been feeling really tired lately, feels like her B12 is a bit low. Has been eating less meat, taking an iron supplement Anemia status: exacerbated Etiology of anemia: B12 deficiency Duration of anemia treatment: chronic Compliance with treatment: excellent compliance Iron supplementation side effects: not on any Severity of anemia: mild Fatigue: yes Decreased exercise tolerance: no  Dyspnea on exertion: no Palpitations: no Bleeding: no Pica: no  NECK PAIN- woke up Sunday morning with pain, thought she slept funny. Hurting on the R side and into her jaw  Duration: 3 days Status: exacerbated Treatments attempted:deep blue and heating pad   Relief with NSAIDs?:  mild Location:Right Severity: severe Quality: burning and sharp stabbing pain Frequency: constant Radiation: R jaw Aggravating factors: moving Alleviating factors: nothing Weakness:  no Paresthesias / decreased sensation:  no  Fevers:  no  Relevant past medical, surgical, family and social history reviewed and updated as indicated. Interim medical history since our last visit reviewed. Allergies and medications reviewed and updated.  Review of Systems  Constitutional: Negative.   Respiratory: Negative.   Cardiovascular: Negative.   Musculoskeletal: Positive for myalgias, neck pain and neck stiffness. Negative for arthralgias, back pain, gait problem and joint swelling.  Skin: Negative.   Neurological: Negative.   Psychiatric/Behavioral: Negative.     Per HPI unless specifically indicated above     Objective:    There were no vitals taken for this visit.  Wt Readings from Last 3 Encounters:  10/18/18 200 lb 14.4 oz (91.1  kg)  03/22/18 194 lb (88 kg)  03/15/18 194 lb 9.6 oz (88.3 kg)    Physical Exam Vitals signs and nursing note reviewed.  Constitutional:      General: She is not in acute distress.    Appearance: Normal appearance. She is not ill-appearing, toxic-appearing or diaphoretic.  HENT:     Head: Normocephalic and atraumatic.     Right Ear: External ear normal.     Left Ear: External ear normal.     Nose: Nose normal.     Mouth/Throat:     Mouth: Mucous membranes are moist.     Pharynx: Oropharynx is clear.  Eyes:     General: No scleral icterus.       Right eye: No discharge.        Left eye: No discharge.     Conjunctiva/sclera: Conjunctivae normal.     Pupils: Pupils are equal, round, and reactive to light.  Neck:     Musculoskeletal: Normal range of motion.  Pulmonary:     Effort: Pulmonary effort is normal. No respiratory distress.     Comments: Speaking in full sentences Musculoskeletal:     Comments: Decreased ROM to rotation and sidebending to the L  Skin:    Coloration: Skin is not jaundiced or pale.     Findings: No bruising, erythema, lesion or rash.  Neurological:     Mental Status: She is alert and oriented to person, place, and time. Mental status is at baseline.  Psychiatric:        Mood and Affect: Mood normal.        Behavior:  Behavior normal.        Thought Content: Thought content normal.        Judgment: Judgment normal.     Results for orders placed or performed in visit on 10/18/18  Microscopic Examination  Result Value Ref Range   WBC, UA 0-5 0 - 5 /hpf   RBC, UA None seen 0 - 2 /hpf   Epithelial Cells (non renal) 0-10 0 - 10 /hpf   Mucus, UA Present Not Estab.   Bacteria, UA Moderate (A) None seen/Few  Urine Culture, Reflex  Result Value Ref Range   Urine Culture, Routine Final report    Organism ID, Bacteria Comment   Veritor Flu A/B Waived  Result Value Ref Range   Influenza A Negative Negative   Influenza B Negative Negative  CBC with  Differential/Platelet  Result Value Ref Range   WBC 8.8 3.4 - 10.8 x10E3/uL   RBC 4.50 3.77 - 5.28 x10E6/uL   Hemoglobin 12.1 11.1 - 15.9 g/dL   Hematocrit 16.137.4 09.634.0 - 46.6 %   MCV 83 79 - 97 fL   MCH 26.9 26.6 - 33.0 pg   MCHC 32.4 31.5 - 35.7 g/dL   RDW 04.514.4 40.911.7 - 81.115.4 %   Platelets 218 150 - 450 x10E3/uL   Neutrophils 67 Not Estab. %   Lymphs 28 Not Estab. %   Monocytes 5 Not Estab. %   Eos 0 Not Estab. %   Basos 0 Not Estab. %   Neutrophils Absolute 5.9 1.4 - 7.0 x10E3/uL   Lymphocytes Absolute 2.4 0.7 - 3.1 x10E3/uL   Monocytes Absolute 0.4 0.1 - 0.9 x10E3/uL   EOS (ABSOLUTE) 0.0 0.0 - 0.4 x10E3/uL   Basophils Absolute 0.0 0.0 - 0.2 x10E3/uL   Immature Granulocytes 0 Not Estab. %   Immature Grans (Abs) 0.0 0.0 - 0.1 x10E3/uL  Bayer DCA Hb A1c Waived  Result Value Ref Range   HB A1C (BAYER DCA - WAIVED) 5.7 <7.0 %  Comprehensive metabolic panel  Result Value Ref Range   Glucose 70 65 - 99 mg/dL   BUN 8 6 - 24 mg/dL   Creatinine, Ser 9.140.70 0.57 - 1.00 mg/dL   GFR calc non Af Amer 107 >59 mL/min/1.73   GFR calc Af Amer 124 >59 mL/min/1.73   BUN/Creatinine Ratio 11 9 - 23   Sodium 141 134 - 144 mmol/L   Potassium 4.1 3.5 - 5.2 mmol/L   Chloride 102 96 - 106 mmol/L   CO2 17 (L) 20 - 29 mmol/L   Calcium 8.9 8.7 - 10.2 mg/dL   Total Protein 6.8 6.0 - 8.5 g/dL   Albumin 4.2 3.5 - 5.5 g/dL   Globulin, Total 2.6 1.5 - 4.5 g/dL   Albumin/Globulin Ratio 1.6 1.2 - 2.2   Bilirubin Total <0.2 0.0 - 1.2 mg/dL   Alkaline Phosphatase 61 39 - 117 IU/L   AST 17 0 - 40 IU/L   ALT 10 0 - 32 IU/L  Lipid Panel w/o Chol/HDL Ratio  Result Value Ref Range   Cholesterol, Total 114 100 - 199 mg/dL   Triglycerides 782111 0 - 149 mg/dL   HDL 50 >95>39 mg/dL   VLDL Cholesterol Cal 22 5 - 40 mg/dL   LDL Calculated 42 0 - 99 mg/dL  Microalbumin, Urine Waived  Result Value Ref Range   Microalb, Ur Waived 30 (H) 0 - 19 mg/L   Creatinine, Urine Waived 100 10 - 300 mg/dL   Microalb/Creat Ratio <30  <30 mg/g  TSH  Result Value Ref Range   TSH 2.120 0.450 - 4.500 uIU/mL  UA/M w/rflx Culture, Routine  Result Value Ref Range   Specific Gravity, UA 1.015 1.005 - 1.030   pH, UA 7.5 5.0 - 7.5   Color, UA Yellow Yellow   Appearance Ur Clear Clear   Leukocytes, UA Trace (A) Negative   Protein, UA Negative Negative/Trace   Glucose, UA Negative Negative   Ketones, UA Negative Negative   RBC, UA Trace (A) Negative   Bilirubin, UA Negative Negative   Urobilinogen, Ur 2.0 (H) 0.2 - 1.0 mg/dL   Nitrite, UA Negative Negative   Microscopic Examination See below:    Urinalysis Reflex Comment   B12 and Folate Panel  Result Value Ref Range   Vitamin B-12 530 232 - 1,245 pg/mL   Folate 11.9 >3.0 ng/mL      Assessment & Plan:   Problem List Items Addressed This Visit      Other   B12 deficiency - Primary    Last B12 was only 530 on biweekly shots- we will increase to weekly shots. Continue to monitor. Call with any concerns.        Other Visit Diagnoses    Torticollis       Will treat with stretches and flexeril. Call with any concerns. Continue to monitor.        Follow up plan: Return in about 6 months (around 08/03/2019) for physical.    . This visit was completed via FaceTime due to the restrictions of the COVID-19 pandemic. All issues as above were discussed and addressed. Physical exam was done as above through visual confirmation on FaceTime. If it was felt that the patient should be evaluated in the office, they were directed there. The patient verbally consented to this visit. . Location of the patient: home . Location of the provider: work . Those involved with this call:  . Provider: Olevia Perches, DO . CMA: Tiffany Reel, CMA . Front Desk/Registration: Adela Ports  . Time spent on call: 25 minutes with patient face to face via video conference. More than 50% of this time was spent in counseling and coordination of care. 40 minutes total spent in review of  patient's record and preparation of their chart.

## 2019-02-01 NOTE — Assessment & Plan Note (Signed)
Last B12 was only 530 on biweekly shots- we will increase to weekly shots. Continue to monitor. Call with any concerns.

## 2019-02-01 NOTE — Patient Instructions (Signed)
Cervical Strain and Sprain Rehab Ask your health care provider which exercises are safe for you. Do exercises exactly as told by your health care provider and adjust them as directed. It is normal to feel mild stretching, pulling, tightness, or discomfort as you do these exercises, but you should stop right away if you feel sudden pain or your pain gets worse.Do not begin these exercises until told by your health care provider. Stretching and range of motion exercises These exercises warm up your muscles and joints and improve the movement and flexibility of your neck. These exercises also help to relieve pain, numbness, and tingling. Exercise A: Cervical side bend  1. Using good posture, sit on a stable chair or stand up. 2. Without moving your shoulders, slowly tilt your left / right ear to your shoulder until you feel a stretch in your neck muscles. You should be looking straight ahead. 3. Hold for __________ seconds. 4. Repeat with the other side of your neck. Repeat __________ times. Complete this exercise __________ times a day. Exercise B: Cervical rotation  1. Using good posture, sit on a stable chair or stand up. 2. Slowly turn your head to the side as if you are looking over your left / right shoulder. ? Keep your eyes level with the ground. ? Stop when you feel a stretch along the side and the back of your neck. 3. Hold for __________ seconds. 4. Repeat this by turning to your other side. Repeat __________ times. Complete this exercise __________ times a day. Exercise C: Thoracic extension and pectoral stretch 1. Roll a towel or a small blanket so it is about 4 inches (10 cm) in diameter. 2. Lie down on your back on a firm surface. 3. Put the towel lengthwise, under your spine in the middle of your back. It should not be not under your shoulder blades. The towel should line up with your spine from your middle back to your lower back. 4. Put your hands behind your head and let your  elbows fall out to your sides. 5. Hold for __________ seconds. Repeat __________ times. Complete this exercise __________ times a day. Strengthening exercises These exercises build strength and endurance in your neck. Endurance is the ability to use your muscles for a long time, even after your muscles get tired. Exercise D: Upper cervical flexion, isometric 1. Lie on your back with a thin pillow behind your head and a small rolled-up towel under your neck. 2. Gently tuck your chin toward your chest and nod your head down to look toward your feet. Do not lift your head off the pillow. 3. Hold for __________ seconds. 4. Release the tension slowly. Relax your neck muscles completely before you repeat this exercise. Repeat __________ times. Complete this exercise __________ times a day. Exercise E: Cervical extension, isometric  1. Stand about 6 inches (15 cm) away from a wall, with your back facing the wall. 2. Place a soft object, about 6-8 inches (15-20 cm) in diameter, between the back of your head and the wall. A soft object could be a small pillow, a ball, or a folded towel. 3. Gently tilt your head back and press into the soft object. Keep your jaw and forehead relaxed. 4. Hold for __________ seconds. 5. Release the tension slowly. Relax your neck muscles completely before you repeat this exercise. Repeat __________ times. Complete this exercise __________ times a day. Posture and body mechanics Body mechanics refers to the movements and positions of your   body while you do your daily activities. Posture is part of body mechanics. Good posture and healthy body mechanics can help to relieve stress in your body's tissues and joints. Good posture means that your spine is in its natural S-curve position (your spine is neutral), your shoulders are pulled back slightly, and your head is not tipped forward. The following are general guidelines for applying improved posture and body mechanics to your  everyday activities. Standing   When standing, keep your spine neutral and keep your feet about hip-width apart. Keep a slight bend in your knees. Your ears, shoulders, and hips should line up.  When you do a task in which you stand in one place for a long time, place one foot up on a stable object that is 2-4 inches (5-10 cm) high, such as a footstool. This helps keep your spine neutral. Sitting   When sitting, keep your spine neutral and your keep feet flat on the floor. Use a footrest, if necessary, and keep your thighs parallel to the floor. Avoid rounding your shoulders, and avoid tilting your head forward.  When working at a desk or a computer, keep your desk at a height where your hands are slightly lower than your elbows. Slide your chair under your desk so you are close enough to maintain good posture.  When working at a computer, place your monitor at a height where you are looking straight ahead and you do not have to tilt your head forward or downward to look at the screen. Resting When lying down and resting, avoid positions that are most painful for you. Try to support your neck in a neutral position. You can use a contour pillow or a small rolled-up towel. Your pillow should support your neck but not push on it. This information is not intended to replace advice given to you by your health care provider. Make sure you discuss any questions you have with your health care provider. Document Released: 09/22/2005 Document Revised: 05/29/2016 Document Reviewed: 08/29/2015 Elsevier Interactive Patient Education  2019 [employer]  

## 2019-03-07 ENCOUNTER — Encounter: Payer: Self-pay | Admitting: Family Medicine

## 2019-03-11 ENCOUNTER — Telehealth: Payer: Self-pay | Admitting: Family Medicine

## 2019-03-11 NOTE — Telephone Encounter (Signed)
Copied from CRM [id number]. Topic: General - Other >> Mar 11, 2019  3:10 PM Gwenlyn Fudge A wrote: Reason for CRM: Pt called and states she  is still feeling tired even after taking more of her B12. Pt states she put in a mychart message and no one got back to her. Pt is requesting to have lab orders put in for iron levels. Please advise.

## 2019-03-11 NOTE — Telephone Encounter (Signed)
Please schedule her appointment to discuss.

## 2019-03-14 NOTE — Telephone Encounter (Signed)
Pt scheduled for Friday. Would like to know if she can get labs done earlier so you will already have results.

## 2019-03-17 ENCOUNTER — Telehealth: Payer: Self-pay | Admitting: Family Medicine

## 2019-03-17 NOTE — Telephone Encounter (Signed)
Called pt to go over script screening for covid-19 no answer left vm °

## 2019-03-18 ENCOUNTER — Ambulatory Visit (INDEPENDENT_AMBULATORY_CARE_PROVIDER_SITE_OTHER): Payer: Self-pay | Admitting: Family Medicine

## 2019-03-18 ENCOUNTER — Telehealth: Payer: Self-pay | Admitting: Family Medicine

## 2019-03-18 ENCOUNTER — Other Ambulatory Visit: Payer: Self-pay

## 2019-03-18 ENCOUNTER — Encounter: Payer: Self-pay | Admitting: Family Medicine

## 2019-03-18 VITALS — BP 123/79 | Ht 62.0 in | Wt 196.0 lb

## 2019-03-18 DIAGNOSIS — M2550 Pain in unspecified joint: Secondary | ICD-10-CM

## 2019-03-18 DIAGNOSIS — R5383 Other fatigue: Secondary | ICD-10-CM

## 2019-03-18 DIAGNOSIS — Z9884 Bariatric surgery status: Secondary | ICD-10-CM

## 2019-03-18 DIAGNOSIS — E538 Deficiency of other specified B group vitamins: Secondary | ICD-10-CM

## 2019-03-18 NOTE — Telephone Encounter (Signed)
Called pt back no answer, left voicemail  Copied from [place] [id number]. Topic: General - Other >> Mar 17, 2019  5:07 PM Rutherford Nail, NT wrote: Reason for CRM: Patient returning call to Laural Roes to go over screening questions before her appointment tomorrow.

## 2019-03-18 NOTE — Progress Notes (Signed)
BP 123/79   Ht 5\' 2"  (1.575 m)   Wt 196 lb (88.9 kg)   BMI 35.85 kg/m    Subjective:    Patient ID: Belinda [name], female    DOB: 08-05-1976, 43 y.o.   MRN: [id number]  HPI: Belinda [name] is a 43 y.o. female  Chief Complaint  Patient presents with  . Fatigue    x about a month.   FATIGUE Duration:  chronic- worse in the past month Severity: severe  Onset: gradual Context when symptoms started:  unknown Symptoms improve with rest: no  Depressive symptoms: no Stress/anxiety: no Insomnia: no  Snoring: no Observed apnea by bed partner: no Daytime hypersomnolence:no Wakes feeling refreshed: yes History of sleep study: no Dysnea on exertion:  no Orthopnea/PND: no Chest pain: yes- couple of weeks ago with a panic attack Chronic cough: no Lower extremity edema: no Arthralgias:yes Myalgias: yes Weakness: no Rash: no  Relevant past medical, surgical, family and social history reviewed and updated as indicated. Interim medical history since our last visit reviewed. Allergies and medications reviewed and updated.  Review of Systems  Constitutional: Positive for activity change and fatigue. Negative for appetite change, chills, diaphoresis, fever and unexpected weight change.  HENT: Negative.   Respiratory: Negative.   Cardiovascular: Negative.   Gastrointestinal: Negative.   Psychiatric/Behavioral: Negative.     Per HPI unless specifically indicated above     Objective:    BP 123/79   Ht 5\' 2"  (1.575 m)   Wt 196 lb (88.9 kg)   BMI 35.85 kg/m   Wt Readings from Last 3 Encounters:  03/18/19 196 lb (88.9 kg)  10/18/18 200 lb 14.4 oz (91.1 kg)  03/22/18 194 lb (88 kg)    Physical Exam Vitals signs and nursing note reviewed.  Constitutional:      General: She is not in acute distress.    Appearance: Normal appearance. She is not ill-appearing, toxic-appearing or diaphoretic.  HENT:     Head: Normocephalic and atraumatic.     Right Ear: External ear  normal.     Left Ear: External ear normal.     Nose: Nose normal.     Mouth/Throat:     Mouth: Mucous membranes are moist.     Pharynx: Oropharynx is clear.  Eyes:     General: No scleral icterus.       Right eye: No discharge.        Left eye: No discharge.     Extraocular Movements: Extraocular movements intact.     Conjunctiva/sclera: Conjunctivae normal.     Pupils: Pupils are equal, round, and reactive to light.  Neck:     Musculoskeletal: Normal range of motion and neck supple.  Cardiovascular:     Rate and Rhythm: Normal rate and regular rhythm.     Pulses: Normal pulses.     Heart sounds: Normal heart sounds. No murmur. No friction rub. No gallop.   Pulmonary:     Effort: Pulmonary effort is normal. No respiratory distress.     Breath sounds: Normal breath sounds. No stridor. No wheezing, rhonchi or rales.  Chest:     Chest wall: No tenderness.  Musculoskeletal: Normal range of motion.  Skin:    General: Skin is warm and dry.     Capillary Refill: Capillary refill takes less than 2 seconds.     Coloration: Skin is not jaundiced or pale.     Findings: No bruising, erythema, lesion or rash.  Neurological:  General: No focal deficit present.     Mental Status: She is alert and oriented to person, place, and time. Mental status is at baseline.  Psychiatric:        Mood and Affect: Mood normal.        Behavior: Behavior normal.        Thought Content: Thought content normal.        Judgment: Judgment normal.     Results for orders placed or performed in visit on 10/18/18  Microscopic Examination   BLD  Result Value Ref Range   WBC, UA 0-5 0 - 5 /hpf   RBC, UA None seen 0 - 2 /hpf   Epithelial Cells (non renal) 0-10 0 - 10 /hpf   Mucus, UA Present Not Estab.   Bacteria, UA Moderate (A) None seen/Few  Urine Culture, Reflex   BLD  Result Value Ref Range   Urine Culture, Routine Final report    Organism ID, Bacteria Comment   Veritor Flu A/B Waived  Result  Value Ref Range   Influenza A Negative Negative   Influenza B Negative Negative  CBC with Differential/Platelet  Result Value Ref Range   WBC 8.8 3.4 - 10.8 x10E3/uL   RBC 4.50 3.77 - 5.28 x10E6/uL   Hemoglobin 12.1 11.1 - 15.9 g/dL   Hematocrit 40.937.4 81.134.0 - 46.6 %   MCV 83 79 - 97 fL   MCH 26.9 26.6 - 33.0 pg   MCHC 32.4 31.5 - 35.7 g/dL   RDW 91.414.4 78.211.7 - 95.615.4 %   Platelets 218 150 - 450 x10E3/uL   Neutrophils 67 Not Estab. %   Lymphs 28 Not Estab. %   Monocytes 5 Not Estab. %   Eos 0 Not Estab. %   Basos 0 Not Estab. %   Neutrophils Absolute 5.9 1.4 - 7.0 x10E3/uL   Lymphocytes Absolute 2.4 0.7 - 3.1 x10E3/uL   Monocytes Absolute 0.4 0.1 - 0.9 x10E3/uL   EOS (ABSOLUTE) 0.0 0.0 - 0.4 x10E3/uL   Basophils Absolute 0.0 0.0 - 0.2 x10E3/uL   Immature Granulocytes 0 Not Estab. %   Immature Grans (Abs) 0.0 0.0 - 0.1 x10E3/uL  Bayer DCA Hb A1c Waived  Result Value Ref Range   HB A1C (BAYER DCA - WAIVED) 5.7 <7.0 %  Comprehensive metabolic panel  Result Value Ref Range   Glucose 70 65 - 99 mg/dL   BUN 8 6 - 24 mg/dL   Creatinine, Ser 2.130.70 0.57 - 1.00 mg/dL   GFR calc non Af Amer 107 >59 mL/min/1.73   GFR calc Af Amer 124 >59 mL/min/1.73   BUN/Creatinine Ratio 11 9 - 23   Sodium 141 134 - 144 mmol/L   Potassium 4.1 3.5 - 5.2 mmol/L   Chloride 102 96 - 106 mmol/L   CO2 17 (L) 20 - 29 mmol/L   Calcium 8.9 8.7 - 10.2 mg/dL   Total Protein 6.8 6.0 - 8.5 g/dL   Albumin 4.2 3.5 - 5.5 g/dL   Globulin, Total 2.6 1.5 - 4.5 g/dL   Albumin/Globulin Ratio 1.6 1.2 - 2.2   Bilirubin Total <0.2 0.0 - 1.2 mg/dL   Alkaline Phosphatase 61 39 - 117 IU/L   AST 17 0 - 40 IU/L   ALT 10 0 - 32 IU/L  Lipid Panel w/o Chol/HDL Ratio  Result Value Ref Range   Cholesterol, Total 114 100 - 199 mg/dL   Triglycerides 086111 0 - 149 mg/dL   HDL 50 >57>39 mg/dL   VLDL  Cholesterol Cal 22 5 - 40 mg/dL   LDL Calculated 42 0 - 99 mg/dL  Microalbumin, Urine Waived  Result Value Ref Range   Microalb, Ur Waived  30 (H) 0 - 19 mg/L   Creatinine, Urine Waived 100 10 - 300 mg/dL   Microalb/Creat Ratio <30 <30 mg/g  TSH  Result Value Ref Range   TSH 2.120 0.450 - 4.500 uIU/mL  UA/M w/rflx Culture, Routine   Specimen: Blood   BLD  Result Value Ref Range   Specific Gravity, UA 1.015 1.005 - 1.030   pH, UA 7.5 5.0 - 7.5   Color, UA Yellow Yellow   Appearance Ur Clear Clear   Leukocytes, UA Trace (A) Negative   Protein, UA Negative Negative/Trace   Glucose, UA Negative Negative   Ketones, UA Negative Negative   RBC, UA Trace (A) Negative   Bilirubin, UA Negative Negative   Urobilinogen, Ur 2.0 (H) 0.2 - 1.0 mg/dL   Nitrite, UA Negative Negative   Microscopic Examination See below:    Urinalysis Reflex Comment   B12 and Folate Panel  Result Value Ref Range   Vitamin B-12 530 232 - 1,245 pg/mL   Folate 11.9 >3.0 ng/mL      Assessment & Plan:   Problem List Items Addressed This Visit      Other   Bariatric surgery status   Relevant Orders   Bayer DCA Hb A1c Waived   Comprehensive metabolic panel   TSH   VITAMIN D 25 Hydroxy (Vit-D Deficiency, Fractures)   Iron and TIBC   Ferritin   B12 and Folate Panel   Vitamin K1, Serum   B12 deficiency   Relevant Orders   Comprehensive metabolic panel    Other Visit Diagnoses    Fatigue, unspecified type    -  Primary   Unclear etiology. Will check labs. Await results. Call with any concerns.    Relevant Orders   Comprehensive metabolic panel   CBC with Differential/Platelet   Arthralgia, unspecified joint       Checking labs. Await results. Call with any concerns.    Relevant Orders   Comprehensive metabolic panel   Rocky mtn spotted fvr abs pnl(IgG+IgM)   Lyme Ab/Western Blot Reflex   Ehrlichia Antibody Panel   Babesia microti Antibody Panel       Follow up plan: Return in about 4 weeks (around 04/15/2019).   . This visit was completed via FaceTime due to the restrictions of the COVID-19 pandemic. All issues as above were  discussed and addressed. Physical exam was done as above through visual confirmation on FaceTime. If it was felt that the patient should be evaluated in the office, they were directed there. The patient verbally consented to this visit. . Location of the patient: home . Location of the provider: work . Those involved with this call:  . Provider: Park Liter, DO . CMA: Gerda Diss, CMA . Front Desk/Registration: Don Perking  . Time spent on call: 15 minutes with patient face to face via video conference. More than 50% of this time was spent in counseling and coordination of care. 23 minutes total spent in review of patient's record and preparation of their chart.

## 2019-03-20 ENCOUNTER — Encounter: Payer: Self-pay | Admitting: Family Medicine

## 2019-03-21 ENCOUNTER — Other Ambulatory Visit: Payer: Self-pay

## 2019-03-21 DIAGNOSIS — E538 Deficiency of other specified B group vitamins: Secondary | ICD-10-CM

## 2019-03-21 DIAGNOSIS — Z9884 Bariatric surgery status: Secondary | ICD-10-CM

## 2019-03-21 DIAGNOSIS — M2550 Pain in unspecified joint: Secondary | ICD-10-CM

## 2019-03-21 DIAGNOSIS — R5383 Other fatigue: Secondary | ICD-10-CM

## 2019-03-22 LAB — BAYER DCA HB A1C WAIVED: HB A1C (BAYER DCA - WAIVED): 5.6 % (ref <7.0)

## 2019-03-25 ENCOUNTER — Telehealth: Payer: Self-pay | Admitting: Family Medicine

## 2019-03-25 NOTE — Telephone Encounter (Signed)
Pt called stating she was advised 6/15 she would have results in a day or two. Please advise.  Ph# [phone]

## 2019-03-25 NOTE — Telephone Encounter (Signed)
Tried to call patient twice, rings, seems like someone picks up but no response. Will try again.

## 2019-03-28 ENCOUNTER — Encounter: Payer: Self-pay | Admitting: Family Medicine

## 2019-03-28 DIAGNOSIS — E611 Iron deficiency: Secondary | ICD-10-CM

## 2019-03-28 NOTE — Telephone Encounter (Signed)
Tried to call patient, phone stopped ringing, but was not connected with anyone.

## 2019-03-28 NOTE — Telephone Encounter (Signed)
Patient notified that her labs were released to my chart account

## 2019-03-29 LAB — COMPREHENSIVE METABOLIC PANEL
ALT: 7 IU/L (ref 0–32)
AST: 12 IU/L (ref 0–40)
Albumin/Globulin Ratio: 1.6 (ref 1.2–2.2)
Albumin: 4.1 g/dL (ref 3.8–4.8)
Alkaline Phosphatase: 79 IU/L (ref 39–117)
BUN/Creatinine Ratio: 11 (ref 9–23)
BUN: 7 mg/dL (ref 6–24)
Bilirubin Total: 0.2 mg/dL (ref 0.0–1.2)
CO2: 21 mmol/L (ref 20–29)
Calcium: 8.8 mg/dL (ref 8.7–10.2)
Chloride: 107 mmol/L — ABNORMAL HIGH (ref 96–106)
Creatinine, Ser: 0.65 mg/dL (ref 0.57–1.00)
GFR calc Af Amer: 126 mL/min/{1.73_m2} (ref >59)
GFR calc non Af Amer: 109 mL/min/{1.73_m2} (ref >59)
Globulin, Total: 2.5 g/dL (ref 1.5–4.5)
Glucose: 85 mg/dL (ref 65–99)
Potassium: 4.6 mmol/L (ref 3.5–5.2)
Sodium: 142 mmol/L (ref 134–144)
Total Protein: 6.6 g/dL (ref 6.0–8.5)

## 2019-03-29 LAB — CBC WITH DIFFERENTIAL/PLATELET
Basophils Absolute: 0.1 10*3/uL (ref 0.0–0.2)
Basos: 1 %
EOS (ABSOLUTE): 0.1 10*3/uL (ref 0.0–0.4)
Eos: 1 %
Hematocrit: 35.8 % (ref 34.0–46.6)
Hemoglobin: 11.6 g/dL (ref 11.1–15.9)
Immature Grans (Abs): 0 10*3/uL (ref 0.0–0.1)
Immature Granulocytes: 0 %
Lymphocytes Absolute: 2.6 10*3/uL (ref 0.7–3.1)
Lymphs: 24 %
MCH: 26.9 pg (ref 26.6–33.0)
MCHC: 32.4 g/dL (ref 31.5–35.7)
MCV: 83 fL (ref 79–97)
Monocytes Absolute: 0.6 10*3/uL (ref 0.1–0.9)
Monocytes: 6 %
Neutrophils Absolute: 7.2 10*3/uL — ABNORMAL HIGH (ref 1.4–7.0)
Neutrophils: 68 %
Platelets: 277 10*3/uL (ref 150–450)
RBC: 4.31 x10E6/uL (ref 3.77–5.28)
RDW: 15.1 % (ref 11.7–15.4)
WBC: 10.6 10*3/uL (ref 3.4–10.8)

## 2019-03-29 LAB — BABESIA MICROTI ANTIBODY PANEL
Babesia microti IgG: <1:10 {titer}
Babesia microti IgM: <1:10 {titer}

## 2019-03-29 LAB — EHRLICHIA ANTIBODY PANEL
E. Chaffeensis (HME) IgM Titer: NEGATIVE
E.Chaffeensis (HME) IgG: NEGATIVE
HGE IgG Titer: NEGATIVE
HGE IgM Titer: NEGATIVE

## 2019-03-29 LAB — ROCKY MTN SPOTTED FVR ABS PNL(IGG+IGM)
RMSF IgG: NEGATIVE
RMSF IgM: 0.56 index (ref 0.00–0.89)

## 2019-03-29 LAB — FERRITIN: Ferritin: 7 ng/mL — ABNORMAL LOW (ref 15–150)

## 2019-03-29 LAB — TSH: TSH: 1.67 u[IU]/mL (ref 0.450–4.500)

## 2019-03-29 LAB — B12 AND FOLATE PANEL
Folate: 6.7 ng/mL (ref >3.0)
Vitamin B-12: 697 pg/mL (ref 232–1245)

## 2019-03-29 LAB — LYME AB/WESTERN BLOT REFLEX
LYME DISEASE AB, QUANT, IGM: <0.8 index (ref 0.00–0.79)
Lyme IgG/IgM Ab: <0.91 {ISR} (ref 0.00–0.90)

## 2019-03-29 LAB — IRON AND TIBC
Iron Saturation: 8 % — CL (ref 15–55)
Iron: 35 ug/dL (ref 27–159)
Total Iron Binding Capacity: 457 ug/dL — ABNORMAL HIGH (ref 250–450)
UIBC: 422 ug/dL (ref 131–425)

## 2019-03-29 LAB — VITAMIN K1, SERUM: VITAMIN K1: 0.32 ng/mL (ref 0.13–1.88)

## 2019-03-29 LAB — VITAMIN D 25 HYDROXY (VIT D DEFICIENCY, FRACTURES): Vit D, 25-Hydroxy: 21.5 ng/mL — ABNORMAL LOW (ref 30.0–100.0)

## 2019-04-04 ENCOUNTER — Other Ambulatory Visit: Payer: Self-pay

## 2019-04-04 ENCOUNTER — Inpatient Hospital Stay: Payer: Self-pay | Attending: Oncology | Admitting: Oncology

## 2019-04-04 ENCOUNTER — Encounter: Payer: Self-pay | Admitting: Oncology

## 2019-04-04 ENCOUNTER — Telehealth: Payer: Self-pay | Admitting: *Deleted

## 2019-04-04 DIAGNOSIS — E559 Vitamin D deficiency, unspecified: Secondary | ICD-10-CM

## 2019-04-04 DIAGNOSIS — Z9884 Bariatric surgery status: Secondary | ICD-10-CM

## 2019-04-04 DIAGNOSIS — D509 Iron deficiency anemia, unspecified: Secondary | ICD-10-CM | POA: Insufficient documentation

## 2019-04-04 NOTE — Progress Notes (Signed)
Patient contacted for telehealth visit. Patient had gastric bypass about 3 years ago and complains of nausea/vomitting ever since. She has learned which foods to avoid. Pt states she feels "completely exhausted."  She states she does not sleep a lot during the day but is fatigued most of the time. Complains of skin being dry and itchy. Mother had Brain cancer (glioblastoma) and father had Rectal, kidney, and bone cancer; both parents are deceased.

## 2019-04-04 NOTE — Progress Notes (Addendum)
HEMATOLOGY-ONCOLOGY TeleHEALTH VISIT INITIAL CONSULTATION  I connected with Belinda [name] on 04/04/19 at  9:30 AM EDT by video enabled telemedicine visit and verified that I am speaking with the correct person using two identifiers. I discussed the limitations, risks, security and privacy concerns of performing an evaluation and management service by telemedicine and the availability of in-person appointments. I also discussed with the patient that there may be a patient responsible charge related to this service. The patient expressed understanding and agreed to proceed.   Other persons participating in the visit and their role in the encounter:  Merleen NicelyElizabeth Santos, RN, check in patient.   Patient's location: Home  Provider's location: work  Referring provider: Dorcas CarrowJohnson, Megan P, DO  Chief Complaint: initial consultation for iron deficiency anemia.   HISTORY OF PRESENT ILLNESS Belinda [name] is a 43 y.o. female who was seen in consultation at the request of Dorcas CarrowJohnson, Megan P, DO for evaluation of iron deficiency anemia.  Reviewed patient's recent labs  Labs revealed anemia with hemoglobin of 11.6, MCV 83. Reviewed patient's previous labs  anemia is intermittent.  03/22/2018 hemoglobin 11.6, hemoglobin improved to 12.1 on 10/18/2018. No aggravating or improving factors.  Associated signs and symptoms: Patient reports fatigue.  Denies SOB with exertion.  Denies weight loss, easy bruising, hematochezia, hemoptysis, hematuria. Context:  History of iron deficiency:  Rectal bleeding: Denies Menstrual bleeding/ Vaginal bleeding : Denies.  Reports normal blood flow Hematemesis or hemoptysis : denies Blood in urine : denies  Last endoscopy: 11/07/2016 endoscopy showed gastric bypass with normal pouch, anastomosis with healthy appearance of mucosa.  Esophageal stenosis. Fatigue: Yes.       Review of Systems  Constitutional: Positive for fatigue. Negative for appetite change, chills and  fever.  HENT:   Negative for hearing loss and voice change.   Eyes: Negative for eye problems.  Respiratory: Negative for chest tightness and cough.   Cardiovascular: Negative for chest pain.  Gastrointestinal: Negative for abdominal distention, abdominal pain and blood in stool.  Endocrine: Negative for hot flashes.  Genitourinary: Negative for difficulty urinating and frequency.   Musculoskeletal: Negative for arthralgias.  Skin: Negative for itching and rash.  Neurological: Negative for extremity weakness.  Hematological: Negative for adenopathy.  Psychiatric/Behavioral: Negative for confusion.    Past Medical History:  Diagnosis Date  . Arthritis    lower back  . Bipolar affective disorder (HCC)   . Controlled substance agreement signed 10/16/2017  . Deviated septum   . Diabetes mellitus without complication (HCC)   . GERD (gastroesophageal reflux disease)   . Headache(784.0)    migraines - none 2 months  . Peptic ulcer   . Pre-diabetes   . Schizophrenia (HCC)   . Sleep apnea   . Substance abuse [facility](HCC)    Past Surgical History:  Procedure Laterality Date  . ESOPHAGOGASTRODUODENOSCOPY  08/05/2012   Procedure: ESOPHAGOGASTRODUODENOSCOPY (EGD);  Surgeon: Kandis Cockingavid H Newman, MD;  [facility];  Service: General;  Laterality: N/A;  . ESOPHAGOGASTRODUODENOSCOPY (EGD) WITH PROPOFOL N/A 08/06/2016   Procedure: ESOPHAGOGASTRODUODENOSCOPY (EGD) WITH PROPOFOL;  Surgeon: Wyline MoodKiran Anna, MD;  [facility];  Service: Endoscopy;  Laterality: N/A;  . ESOPHAGOGASTRODUODENOSCOPY (EGD) WITH PROPOFOL N/A 11/07/2016   Procedure: ESOPHAGOGASTRODUODENOSCOPY (EGD) WITH PROPOFOL;  Surgeon: Wyline MoodKiran Anna, MD;  Location: [facility];  Service: Endoscopy;  Laterality: N/A;  . 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/2011   Procedure: LAPAROSCOPIC ROUX-EN-Y GASTRIC;  Surgeon: Mariella SaaBenjamin T Hoxworth, MD;  Location: [facility];  Service: General;  Laterality: N/A;  upper endoscopy  . SEPTOPLASTY  2003     Family History  Problem Relation Age of Onset  . Diabetes Mother   . Cancer Mother 70       brain, skin  . Cancer Father 39       kidney, bone, rectal  . Hyperlipidemia Father   . Hypertension Father   . Stroke Father   . Cancer Paternal Aunt        breast  . Diabetes Brother   . Allergies Sister     Social History   Socioeconomic History  . Marital status: Married    Spouse name: Not on file  . Number of children: Not on file  . Years of education: Not on file  . Highest education level: Not on file  Occupational History  . Occupation: unemployed  Social Needs  . Financial resource strain: Not on file  . Food insecurity    Worry: Not on file    Inability: Not on file  . Transportation needs    Medical: Not on file    Non-medical: Not on file  Tobacco Use  . Smoking status: Former Smoker    Quit date: 04/03/2017    Years since quitting: 2.0  . Smokeless tobacco: Never Used  . Tobacco comment: only smoked for about 3months   Substance and Sexual Activity  . Alcohol use: Yes    Alcohol/week: 3.0 standard drinks    Types: 3 Standard drinks or equivalent per week    Comment: a few times a week  . Drug use: Not Currently    Types: Marijuana  . Sexual activity: Yes    Birth control/protection: None  Lifestyle  . Physical activity    Days per week: Not on file    Minutes per session: Not on file  . Stress: Not on file  Relationships  . Social Herbalist on phone: Not on file    Gets together: Not on file    Attends religious service: Not on file    Active member of club or organization: Not on file    Attends meetings of clubs or organizations: Not on file    Relationship status: Not on file  . Intimate partner violence    Fear of current or ex partner: Not on file    Emotionally abused: Not on file    Physically abused: Not on file    Forced sexual activity: Not on file  Other Topics Concern  . Not on file  Social History Narrative  . Not on  file    Current Outpatient Medications on File Prior to Visit  Medication Sig Dispense Refill  . B-D 3CC LUER-LOK SYR 25GX1" 25G X 1" 3 ML MISC     . clonazePAM (KLONOPIN) 0.5 MG tablet Take 0.5 mg by mouth 2 (two) times daily as needed for anxiety.    . cyanocobalamin (,VITAMIN B-12,) 1000 MCG/ML injection Inject 1 mL (1,000 mcg total) into the muscle once a week. 30 mL 1  . lidocaine (LIDODERM) 5 % Place 1 patch onto the skin daily. Remove & Discard patch within 12 hours or as directed by MD 30 patch 0   No current facility-administered medications on file prior to visit.     Allergies  Allergen Reactions  . Depakote [Divalproex Sodium] Other (See Comments)    Tremors  . Acetaminophen Nausea Only       Observations/Objective: There were no vitals filed for this visit. There is no  height or weight on file to calculate BMI.  Physical Exam  Constitutional: She is oriented to person, place, and time. No distress.  HENT:  Head: Normocephalic and atraumatic.  Pulmonary/Chest: Effort normal.  Neurological: She is alert and oriented to person, place, and time.  Psychiatric: Affect normal.    I have personally reviewed below laboratory results.  CBC    Component Value Date/Time   WBC 10.6 03/21/2019 1458   WBC 9.7 03/22/2018 0512   RBC 4.31 03/21/2019 1458   RBC 4.13 03/22/2018 0512   HGB 11.6 03/21/2019 1458   HCT 35.8 03/21/2019 1458   PLT 277 03/21/2019 1458   MCV 83 03/21/2019 1458   MCH 26.9 03/21/2019 1458   MCH 28.1 03/22/2018 0512   MCHC 32.4 03/21/2019 1458   MCHC 33.5 03/22/2018 0512   RDW 15.1 03/21/2019 1458   LYMPHSABS 2.6 03/21/2019 1458   MONOABS 0.9 08/21/2017 1524   EOSABS 0.1 03/21/2019 1458   BASOSABS 0.1 03/21/2019 1458    CMP     Component Value Date/Time   NA 142 03/21/2019 1458   K 4.6 03/21/2019 1458   CL 107 (H) 03/21/2019 1458   CO2 21 03/21/2019 1458   GLUCOSE 85 03/21/2019 1458   GLUCOSE 109 (H) 03/22/2018 0512   BUN 7 03/21/2019 1458    CREATININE 0.65 03/21/2019 1458   CREATININE 0.88 11/14/2011 1728   CALCIUM 8.8 03/21/2019 1458   PROT 6.6 03/21/2019 1458   ALBUMIN 4.1 03/21/2019 1458   AST 12 03/21/2019 1458   ALT 7 03/21/2019 1458   ALKPHOS 79 03/21/2019 1458   BILITOT 0.2 03/21/2019 1458   GFRNONAA 109 03/21/2019 1458   GFRAA 126 03/21/2019 1458     RADIOGRAPHIC STUDIES: I have personally reviewed the radiological images as listed and agreed with the findings in the report.  No results found.  Assessment and Plan: 1. History of gastric bypass   2. Iron deficiency anemia, unspecified iron deficiency anemia type   3. Vitamin D deficiency     Labs reviewed and discussed with patient. Patient has mild anemia.  Iron panel is consistent with severe iron deficiency. Most likely secondary to malabsorption due to gastric bypass. Plan IV iron with Venofer 200mg  weekly x 4 doses. Allergy reactions/infusion reaction including anaphylactic reaction discussed with patient. Other side effects include but not limited to high blood pressure, skin rash, weight gain, leg swelling, etc. Patient voices understanding and willing to proceed  Vitamin B12 and folate level was recently checked normal level.  Check copper at next visit. Vitamin D deficiency, recommend oral vitamin D supplementation.  Follow-up with primary care physician.  Follow Up Instructions: 6 weeks.   I discussed the assessment and treatment plan with the patient. The patient was provided an opportunity to ask questions and all were answered. The patient agreed with the plan and demonstrated an understanding of the instructions.  The patient was advised to call back or seek an in-person evaluation if the symptoms worsen or if the condition fails to improve as anticipated.   I provided 45 minutes of face-to-face video visit time during this encounter, and > 50% was spent counseling as documented under my assessment & plan.  Rickard PatienceZhou Zubair Lofton, MD 04/04/2019 8:37 PM

## 2019-04-04 NOTE — Addendum Note (Signed)
Addended by: Earlie Server on: 04/04/2019 09:23 PM   Modules accepted: Level of Service

## 2019-04-04 NOTE — Telephone Encounter (Signed)
All appts per 04/04/19 los. were scheduled. A detailed message was left on pt vmail making her aware of the scheduled dates and times Remainder letter mailed out.

## 2019-04-06 ENCOUNTER — Encounter: Payer: Self-pay | Admitting: Pharmacy Technician

## 2019-04-06 NOTE — Progress Notes (Signed)
Patient has been approved for drug assistance by [employer] for Venofer. The enrollment period is from 04/06/19-04/04/20 based on self pay. First DOS covered is 04/11/19.

## 2019-04-07 ENCOUNTER — Other Ambulatory Visit: Payer: Self-pay

## 2019-04-11 ENCOUNTER — Inpatient Hospital Stay: Payer: Self-pay | Attending: Oncology

## 2019-04-11 ENCOUNTER — Other Ambulatory Visit: Payer: Self-pay

## 2019-04-11 VITALS — BP 105/70 | HR 66 | Temp 98.9°F | Resp 20

## 2019-04-11 DIAGNOSIS — D509 Iron deficiency anemia, unspecified: Secondary | ICD-10-CM | POA: Insufficient documentation

## 2019-04-11 MED ORDER — IRON SUCROSE 20 MG/ML IV SOLN
200.0000 mg | Freq: Once | INTRAVENOUS | Status: AC
  Administered 2019-04-11: 14:00:00 200 mg via INTRAVENOUS
  Filled 2019-04-11: qty 10

## 2019-04-11 MED ORDER — SODIUM CHLORIDE 0.9 % IV SOLN
Freq: Once | INTRAVENOUS | Status: AC
  Administered 2019-04-11: 13:00:00 via INTRAVENOUS
  Filled 2019-04-11: qty 250

## 2019-04-15 ENCOUNTER — Other Ambulatory Visit: Payer: Self-pay

## 2019-04-18 ENCOUNTER — Other Ambulatory Visit: Payer: Self-pay

## 2019-04-18 ENCOUNTER — Inpatient Hospital Stay: Payer: Self-pay

## 2019-04-18 VITALS — BP 114/75 | HR 60 | Temp 98.5°F | Resp 18

## 2019-04-18 DIAGNOSIS — D509 Iron deficiency anemia, unspecified: Secondary | ICD-10-CM

## 2019-04-18 MED ORDER — IRON SUCROSE 20 MG/ML IV SOLN
200.0000 mg | Freq: Once | INTRAVENOUS | Status: AC
  Administered 2019-04-18: 200 mg via INTRAVENOUS
  Filled 2019-04-18: qty 10

## 2019-04-18 MED ORDER — SODIUM CHLORIDE 0.9 % IV SOLN
Freq: Once | INTRAVENOUS | Status: AC
  Administered 2019-04-18: 14:00:00 via INTRAVENOUS
  Filled 2019-04-18: qty 250

## 2019-04-21 ENCOUNTER — Ambulatory Visit: Payer: Self-pay | Admitting: Family Medicine

## 2019-04-21 NOTE — Progress Notes (Deleted)
There were no vitals taken for this visit.   Subjective:    Patient ID: Belinda [name], female    DOB: 1975/12/17, 43 y.o.   MRN: [id number]  HPI: Belinda [name] is a 43 y.o. female  No chief complaint on file.  FATIGUE Duration:  {Blank single:19197::"chronic","days","weeks","months"} Severity: {Blank single:19197::"mild","moderate","severe","1/10","2/10","3/10","4/10","5/10","6/10","7/10","8/10","9/10","10/10"}  Onset: {Blank single:19197::"sudden","gradual"} Context when symptoms started:  {Blank single:19197::"none","unknown"} Symptoms improve with rest: {Blank single:19197::"yes","no"}  Depressive symptoms: {Blank single:19197::"yes","no"} Stress/anxiety: {Blank single:19197::"yes","no"} Insomnia: {Blank single:19197::"yes","no"} {Blank single:19197::"hard to fall asleep","hard to stay asleep"} Snoring: {Blank single:19197::"yes","no"} Observed apnea by bed partner: {Blank single:19197::"yes","no"} Daytime hypersomnolence:{Blank single:19197::"yes","no"} Wakes feeling refreshed: {Blank single:19197::"yes","no"} History of sleep study: {Blank single:19197::"yes","no"} Dysnea on exertion:  {Blank single:19197::"yes","no"} Orthopnea/PND: {Blank single:19197::"yes","no"} Chest pain: {Blank single:19197::"yes","no"} Chronic cough: {Blank single:19197::"yes","no"} Lower extremity edema: {Blank single:19197::"yes","no"} Arthralgias:{Blank single:19197::"yes","no"} Myalgias: {Blank single:19197::"yes","no"} Weakness: {Blank single:19197::"yes","no"} Rash: {Blank single:19197::"yes","no"}  Relevant past medical, surgical, family and social history reviewed and updated as indicated. Interim medical history since our last visit reviewed. Allergies and medications reviewed and updated.  Review of Systems  Per HPI unless specifically indicated above     Objective:    There were no vitals taken for this visit.  Wt Readings from Last 3 Encounters:  03/18/19 196 lb (88.9 kg)   10/18/18 200 lb 14.4 oz (91.1 kg)  03/22/18 194 lb (88 kg)    Physical Exam  Results for orders placed or performed in visit on 03/21/19  CBC with Differential/Platelet  Result Value Ref Range   WBC 10.6 3.4 - 10.8 x10E3/uL   RBC 4.31 3.77 - 5.28 x10E6/uL   Hemoglobin 11.6 11.1 - 15.9 g/dL   Hematocrit 08.635.8 57.834.0 - 46.6 %   MCV 83 79 - 97 fL   MCH 26.9 26.6 - 33.0 pg   MCHC 32.4 31.5 - 35.7 g/dL   RDW 46.915.1 62.911.7 - 52.815.4 %   Platelets 277 150 - 450 x10E3/uL   Neutrophils 68 Not Estab. %   Lymphs 24 Not Estab. %   Monocytes 6 Not Estab. %   Eos 1 Not Estab. %   Basos 1 Not Estab. %   Neutrophils Absolute 7.2 (H) 1.4 - 7.0 x10E3/uL   Lymphocytes Absolute 2.6 0.7 - 3.1 x10E3/uL   Monocytes Absolute 0.6 0.1 - 0.9 x10E3/uL   EOS (ABSOLUTE) 0.1 0.0 - 0.4 x10E3/uL   Basophils Absolute 0.1 0.0 - 0.2 x10E3/uL   Immature Granulocytes 0 Not Estab. %   Immature Grans (Abs) 0.0 0.0 - 0.1 x10E3/uL  Babesia microti Antibody Panel  Result Value Ref Range   Babesia microti IgM <1:10 Neg:<1:10   Babesia microti IgG <1:10 Neg:<1:10  Ehrlichia Antibody Panel  Result Value Ref Range   E.Chaffeensis (HME) IgG Negative Neg:<1:64   E. Chaffeensis (HME) IgM Titer Negative Neg:<1:20   HGE IgG Titer Negative Neg:<1:64   HGE IgM Titer Negative Neg:<1:20  Lyme Ab/Western Blot Reflex  Result Value Ref Range   Lyme IgG/IgM Ab <0.91 0.00 - 0.90 ISR   LYME DISEASE AB, QUANT, IGM <0.80 0.00 - 0.79 index  Rocky mtn spotted fvr abs pnl(IgG+IgM)  Result Value Ref Range   RMSF IgG Negative Negative   RMSF IgM 0.56 0.00 - 0.89 index  B12 and Folate Panel  Result Value Ref Range   Vitamin B-12 697 232 - 1,245 pg/mL   Folate 6.7 >3.0 ng/mL  Ferritin  Result Value Ref Range   Ferritin 7 (L) 15 - 150 ng/mL  Iron and TIBC  Result Value Ref  Range   Total Iron Binding Capacity 457 (H) 250 - 450 ug/dL   UIBC 422 131 - 425 ug/dL   Iron 35 27 - 159 ug/dL   Iron Saturation 8 (LL) 15 - 55 %  VITAMIN D 25  Hydroxy (Vit-D Deficiency, Fractures)  Result Value Ref Range   Vit D, 25-Hydroxy 21.5 (L) 30.0 - 100.0 ng/mL  TSH  Result Value Ref Range   TSH 1.670 0.450 - 4.500 uIU/mL  Comprehensive metabolic panel  Result Value Ref Range   Glucose 85 65 - 99 mg/dL   BUN 7 6 - 24 mg/dL   Creatinine, Ser 0.65 0.57 - 1.00 mg/dL   GFR calc non Af Amer 109 >59 mL/min/1.73   GFR calc Af Amer 126 >59 mL/min/1.73   BUN/Creatinine Ratio 11 9 - 23   Sodium 142 134 - 144 mmol/L   Potassium 4.6 3.5 - 5.2 mmol/L   Chloride 107 (H) 96 - 106 mmol/L   CO2 21 20 - 29 mmol/L   Calcium 8.8 8.7 - 10.2 mg/dL   Total Protein 6.6 6.0 - 8.5 g/dL   Albumin 4.1 3.8 - 4.8 g/dL   Globulin, Total 2.5 1.5 - 4.5 g/dL   Albumin/Globulin Ratio 1.6 1.2 - 2.2   Bilirubin Total 0.2 0.0 - 1.2 mg/dL   Alkaline Phosphatase 79 39 - 117 IU/L   AST 12 0 - 40 IU/L   ALT 7 0 - 32 IU/L  Bayer DCA Hb A1c Waived  Result Value Ref Range   HB A1C (BAYER DCA - WAIVED) 5.6 <7.0 %  Vitamin K1, Serum  Result Value Ref Range   VITAMIN K1 0.32 0.13 - 1.88 ng/mL      Assessment & Plan:   Problem List Items Addressed This Visit    None       Follow up plan: No follow-ups on file.

## 2019-04-25 ENCOUNTER — Other Ambulatory Visit: Payer: Self-pay

## 2019-04-25 ENCOUNTER — Inpatient Hospital Stay: Payer: Self-pay

## 2019-04-25 VITALS — BP 110/75 | HR 80 | Temp 97.0°F | Resp 18

## 2019-04-25 DIAGNOSIS — D509 Iron deficiency anemia, unspecified: Secondary | ICD-10-CM

## 2019-04-25 MED ORDER — SODIUM CHLORIDE 0.9 % IV SOLN
Freq: Once | INTRAVENOUS | Status: AC
  Administered 2019-04-25: 14:00:00 via INTRAVENOUS
  Filled 2019-04-25: qty 250

## 2019-04-25 MED ORDER — IRON SUCROSE 20 MG/ML IV SOLN
200.0000 mg | Freq: Once | INTRAVENOUS | Status: AC
  Administered 2019-04-25: 200 mg via INTRAVENOUS
  Filled 2019-04-25: qty 10

## 2019-05-02 ENCOUNTER — Inpatient Hospital Stay: Payer: Self-pay

## 2019-05-02 ENCOUNTER — Other Ambulatory Visit: Payer: Self-pay

## 2019-05-02 VITALS — BP 118/77 | HR 73 | Resp 18

## 2019-05-02 DIAGNOSIS — D509 Iron deficiency anemia, unspecified: Secondary | ICD-10-CM

## 2019-05-02 MED ORDER — SODIUM CHLORIDE 0.9 % IV SOLN
Freq: Once | INTRAVENOUS | Status: AC
  Administered 2019-05-02: 14:00:00 via INTRAVENOUS
  Filled 2019-05-02: qty 250

## 2019-05-02 MED ORDER — IRON SUCROSE 20 MG/ML IV SOLN
200.0000 mg | Freq: Once | INTRAVENOUS | Status: AC
  Administered 2019-05-02: 200 mg via INTRAVENOUS
  Filled 2019-05-02: qty 10

## 2019-05-06 ENCOUNTER — Telehealth: Payer: Self-pay | Admitting: Nurse Practitioner

## 2019-05-06 ENCOUNTER — Telehealth: Payer: Self-pay | Admitting: Oncology

## 2019-05-06 DIAGNOSIS — S60222A Contusion of left hand, initial encounter: Secondary | ICD-10-CM

## 2019-05-06 NOTE — Telephone Encounter (Signed)
Patient called and reported that she developed swelling and pain of her left hand after IV iron infusion on 05/02/2019. She applied ice swelling was better for 2 days and now today swelling and pain got worse, her hand turned red Recommend patient to apply ice, if no improvement, I recommend patient to go to urgent care and have further evaluation.  She voices understanding.

## 2019-05-06 NOTE — Progress Notes (Signed)
Spoke with patient on phone. Patient goes to the oncology center for iron infusions and they had a hard time getting iv started in left hand. Hand is still swollen and red. Is cool to tough. Just hurts to move hand or make a fist.. instructed to just ice area and watch over weekend. Take ibuprofen as needed. She will see oncologist again Monday.  Based on what you shared with me it looks like you have contusion,that should be evaluated in a face to face office visit. Unless it worsens can wait until your follow up appointment on Monday with oncology   NOTE: If you entered your credit card information for this eVisit, you will not be charged. You may see a "hold" on your card for the $30 but that hold will drop off and you will not have a charge processed.  If you are having a true medical emergency please call 911.  If you need an urgent face to face visit, [place] has four urgent care centers for your convenience.

## 2019-05-12 ENCOUNTER — Other Ambulatory Visit: Payer: Self-pay

## 2019-05-13 ENCOUNTER — Other Ambulatory Visit: Payer: Self-pay

## 2019-05-13 ENCOUNTER — Inpatient Hospital Stay: Payer: Self-pay | Attending: Oncology

## 2019-05-13 DIAGNOSIS — E119 Type 2 diabetes mellitus without complications: Secondary | ICD-10-CM | POA: Insufficient documentation

## 2019-05-13 DIAGNOSIS — D509 Iron deficiency anemia, unspecified: Secondary | ICD-10-CM | POA: Insufficient documentation

## 2019-05-13 DIAGNOSIS — Z79899 Other long term (current) drug therapy: Secondary | ICD-10-CM | POA: Insufficient documentation

## 2019-05-13 DIAGNOSIS — E559 Vitamin D deficiency, unspecified: Secondary | ICD-10-CM | POA: Insufficient documentation

## 2019-05-13 DIAGNOSIS — Z9884 Bariatric surgery status: Secondary | ICD-10-CM | POA: Insufficient documentation

## 2019-05-13 DIAGNOSIS — Z87891 Personal history of nicotine dependence: Secondary | ICD-10-CM | POA: Insufficient documentation

## 2019-05-13 DIAGNOSIS — F319 Bipolar disorder, unspecified: Secondary | ICD-10-CM | POA: Insufficient documentation

## 2019-05-13 DIAGNOSIS — E538 Deficiency of other specified B group vitamins: Secondary | ICD-10-CM | POA: Insufficient documentation

## 2019-05-13 LAB — FERRITIN: Ferritin: 67 ng/mL (ref 11–307)

## 2019-05-13 LAB — COMPREHENSIVE METABOLIC PANEL
ALT: 13 U/L (ref 0–44)
AST: 15 U/L (ref 15–41)
Albumin: 4 g/dL (ref 3.5–5.0)
Alkaline Phosphatase: 62 U/L (ref 38–126)
Anion gap: 7 (ref 5–15)
BUN: 7 mg/dL (ref 6–20)
CO2: 24 mmol/L (ref 22–32)
Calcium: 8.9 mg/dL (ref 8.9–10.3)
Chloride: 107 mmol/L (ref 98–111)
Creatinine, Ser: 0.71 mg/dL (ref 0.44–1.00)
GFR calc Af Amer: >60 mL/min (ref >60)
GFR calc non Af Amer: >60 mL/min (ref >60)
Glucose, Bld: 96 mg/dL (ref 70–99)
Potassium: 4.1 mmol/L (ref 3.5–5.1)
Sodium: 138 mmol/L (ref 135–145)
Total Bilirubin: 0.4 mg/dL (ref 0.3–1.2)
Total Protein: 7.1 g/dL (ref 6.5–8.1)

## 2019-05-13 LAB — CBC WITH DIFFERENTIAL/PLATELET
Abs Immature Granulocytes: 0.04 10*3/uL (ref 0.00–0.07)
Basophils Absolute: 0.1 10*3/uL (ref 0.0–0.1)
Basophils Relative: 1 %
Eosinophils Absolute: 0.1 10*3/uL (ref 0.0–0.5)
Eosinophils Relative: 1 %
HCT: 38.1 % (ref 36.0–46.0)
Hemoglobin: 12.6 g/dL (ref 12.0–15.0)
Immature Granulocytes: 0 %
Lymphocytes Relative: 31 %
Lymphs Abs: 3.2 10*3/uL (ref 0.7–4.0)
MCH: 29.2 pg (ref 26.0–34.0)
MCHC: 33.1 g/dL (ref 30.0–36.0)
MCV: 88.4 fL (ref 80.0–100.0)
Monocytes Absolute: 0.7 10*3/uL (ref 0.1–1.0)
Monocytes Relative: 7 %
Neutro Abs: 6 10*3/uL (ref 1.7–7.7)
Neutrophils Relative %: 60 %
Platelets: 248 10*3/uL (ref 150–400)
RBC: 4.31 MIL/uL (ref 3.87–5.11)
RDW: 17.6 % — ABNORMAL HIGH (ref 11.5–15.5)
WBC: 10.1 10*3/uL (ref 4.0–10.5)
nRBC: 0.2 % (ref 0.0–0.2)

## 2019-05-13 LAB — IRON AND TIBC
Iron: 94 ug/dL (ref 28–170)
Saturation Ratios: 25 % (ref 10.4–31.8)
TIBC: 381 ug/dL (ref 250–450)
UIBC: 287 ug/dL

## 2019-05-16 ENCOUNTER — Encounter: Payer: Self-pay | Admitting: Oncology

## 2019-05-16 ENCOUNTER — Inpatient Hospital Stay: Payer: Self-pay

## 2019-05-16 ENCOUNTER — Other Ambulatory Visit: Payer: Self-pay

## 2019-05-16 ENCOUNTER — Inpatient Hospital Stay (HOSPITAL_BASED_OUTPATIENT_CLINIC_OR_DEPARTMENT_OTHER): Payer: Self-pay | Admitting: Oncology

## 2019-05-16 VITALS — BP 115/77 | HR 87 | Temp 99.8°F | Resp 16 | Wt 208.2 lb

## 2019-05-16 DIAGNOSIS — E559 Vitamin D deficiency, unspecified: Secondary | ICD-10-CM

## 2019-05-16 DIAGNOSIS — D509 Iron deficiency anemia, unspecified: Secondary | ICD-10-CM

## 2019-05-16 DIAGNOSIS — Z9884 Bariatric surgery status: Secondary | ICD-10-CM

## 2019-05-16 NOTE — Progress Notes (Signed)
Patient reports the iron infusions are helping with energy.

## 2019-05-16 NOTE — Progress Notes (Signed)
Hematology/Oncology Progress Note [facility] [phone] [phone]  Patient Care Team: Valerie Roys, DO as PCP - General (Family Medicine) Anabel Bene, MD as Referring Physician (Neurology) Group, [facility] Chauncey Mann, MD as Referring Physician (Psychiatry)   Name of the patient: Belinda [name]  [id number]  [birth year]  Date of visit: 05/16/19   INTERVAL HISTORY-  43 y.o. female with history of gastric bypass iron deficient anemia present for follow-up. Status postFor weekly Venofer treatments.  Reports tolerating well, except that she developed swelling and pain of her left hand after IV iron infusion on 05/02/2019.  She applied ice and swelling/pain has improved to 2 days after the infusion and got worse again.  She called at that time and I spoke with her.  Advised patient to utilize ice, and take Tylenol if needed.  Patient reports that symptoms improved. Today she reports that her energy level has significantly improved.  No new complaints. Reports regular menstrual period, flow is normal.  Usually last about 3 days. Denies any blood in the stool.  Review of systems- Review of Systems  Constitutional: Negative for appetite change, chills, fatigue and fever.  HENT:   Negative for hearing loss and voice change.   Eyes: Negative for eye problems.  Respiratory: Negative for chest tightness and cough.   Cardiovascular: Negative for chest pain.  Gastrointestinal: Negative for abdominal distention, abdominal pain and blood in stool.  Endocrine: Negative for hot flashes.  Genitourinary: Negative for difficulty urinating and frequency.   Musculoskeletal: Negative for arthralgias.  Skin: Negative for itching and rash.  Neurological: Negative for extremity weakness.  Hematological: Negative for adenopathy.  Psychiatric/Behavioral: Negative for confusion.    Allergies  Allergen Reactions  .  Depakote [Divalproex Sodium] Other (See Comments)    Tremors  . Acetaminophen Nausea Only    Patient Active Problem List   Diagnosis Date Noted  . IDA (iron deficiency anemia) 04/04/2019  . Bipolar 1 disorder, depressed ([place]) 03/22/2018  . Sedative overdose 03/22/2018  . 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 Nausea   . Arthropathy of lumbar facet joint 05/28/2016  . B12 deficiency 02/13/2016  . Bariatric surgery status 02/23/2014  . Morbid obesity ([place]) 10/14/2011     Past Medical History:  Diagnosis Date  . Arthritis    lower back  . Bipolar affective disorder ([place])   . Controlled substance agreement signed 10/16/2017  . Deviated septum   . Diabetes mellitus without complication ([place])   . GERD (gastroesophageal reflux disease)   . Headache(784.0)    migraines - none 2 months  . Peptic ulcer   . Pre-diabetes   . Schizophrenia ([place])   . Sleep apnea   . Substance abuse [facility])      Past Surgical History:  Procedure Laterality Date  . ESOPHAGOGASTRODUODENOSCOPY  08/05/2012   Procedure: ESOPHAGOGASTRODUODENOSCOPY (EGD);  Surgeon: Shann Medal, MD;  Location: [facility];  Service: General;  Laterality: N/A;  . ESOPHAGOGASTRODUODENOSCOPY (EGD) WITH PROPOFOL N/A 08/06/2016   Procedure: ESOPHAGOGASTRODUODENOSCOPY (EGD) WITH PROPOFOL;  Surgeon: Jonathon Bellows, MD;  Location: [facility];  Service: Endoscopy;  Laterality: N/A;  . ESOPHAGOGASTRODUODENOSCOPY (EGD) WITH PROPOFOL N/A 11/07/2016   Procedure: ESOPHAGOGASTRODUODENOSCOPY (EGD) WITH PROPOFOL;  Surgeon: Jonathon Bellows, MD;  Location: [facility] END[facility];  Service: Endoscopy;  Laterality: N/A;  . 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 SEPTOPLASTY  2003    Social History   Socioeconomic History  . Marital status: Married    Spouse name: Not on file  . Number of children: Not on  file  . Years of education: Not on file  . Highest education level: Not on file  Occupational History  . Occupation: unemployed  Social Needs  . Financial resource strain: Not on file  . Food insecurity    Worry: Not on file    Inability: Not on file  . Transportation needs    Medical: Not on file    Non-medical: Not on file  Tobacco Use  . Smoking status: Former Smoker    Quit date: 04/03/2017    Years since quitting: 2.1  . Smokeless tobacco: Never Used  . Tobacco comment: only smoked for about 6months   Substance and Sexual Activity  . Alcohol use: Yes    Alcohol/week: 3.0 standard drinks    Types: 3 Standard drinks or equivalent per week    Comment: a few times a week  . Drug use: Not Currently    Types: Marijuana  . Sexual activity: Yes    Birth control/protection: None  Lifestyle  . Physical activity    Days per week: Not on file    Minutes per session: Not on file  . Stress: Not on file  Relationships  . Social Musicianconnections    Talks on phone: Not on file    Gets together: Not on file    Attends religious service: Not on file    Active member of club or organization: Not on file    Attends meetings of clubs or organizations: Not on file    Relationship status: Not on file  . Intimate partner violence    Fear of current or ex partner: Not on file    Emotionally abused: Not on file    Physically abused: Not on file    Forced sexual activity: Not on file  Other Topics Concern  . Not on file  Social History Narrative  . Not on file     Family History  Problem Relation Age of Onset  . Diabetes Mother   . Cancer Mother [id number]       brain, skin  . Cancer Father [id number]       kidney, bone, rectal  . Hyperlipidemia Father   . Hypertension Father   . Stroke Father   . Cancer Paternal Aunt        breast  . Diabetes Brother   . Allergies Sister      Current Outpatient Medications:  .  B-D 3CC LUER-LOK SYR 25GX1" 25G X 1" 3 ML MISC, , Disp: , Rfl:  .  clonazePAM  (KLONOPIN) 0.5 MG tablet, Take 0.5 mg by mouth 2 (two) times daily as needed for anxiety., Disp: , Rfl:  .  cyanocobalamin (,VITAMIN B-12,) 1000 MCG/ML injection, Inject 1 mL (1,000 mcg total) into the muscle once a week., Disp: 30 mL, Rfl: 1 .  lidocaine (LIDODERM) 5 %, Place 1 patch onto the skin daily. Remove & Discard patch within 12 hours or as directed by MD, Disp: 30 patch, Rfl: 0   Physical exam:  Vitals:   05/16/19 1303  BP: 115/77  Pulse: 87  Resp: 16  Temp: 99.8 F (37.7 C)  Weight: 208 lb 3.2 oz (94.4 kg)   Physical Exam  Constitutional: She is oriented to person, place, and time. No distress.  HENT:  Head:  Normocephalic and atraumatic.  Mouth/Throat: No oropharyngeal exudate.  Eyes: Pupils are equal, round, and reactive to light. EOM are normal. No scleral icterus.  Neck: Normal range of motion. Neck supple.  Cardiovascular: Normal rate and regular rhythm.  No murmur heard. Pulmonary/Chest: Effort normal. No respiratory distress. She has no rales. She exhibits no tenderness.  Abdominal: Soft. She exhibits no distension. There is no abdominal tenderness.  Musculoskeletal: Normal range of motion.        General: No edema.  Neurological: She is alert and oriented to person, place, and time. No cranial nerve deficit. She exhibits normal muscle tone. Coordination normal.  Skin: Skin is warm and dry. She is not diaphoretic. No erythema.  Psychiatric: Affect normal.       CMP Latest Ref Rng & Units 05/13/2019  Glucose 70 - 99 mg/dL 96  BUN 6 - 20 mg/dL 7  Creatinine 5.780.44 - 4.691.00 mg/dL 6.290.71  Sodium 528135 - 413145 mmol/L 138  Potassium 3.5 - 5.1 mmol/L 4.1  Chloride 98 - 111 mmol/L 107  CO2 22 - 32 mmol/L 24  Calcium 8.9 - 10.3 mg/dL 8.9  Total Protein 6.5 - 8.1 g/dL 7.1  Total Bilirubin 0.3 - 1.2 mg/dL 0.4  Alkaline Phos 38 - 126 U/L 62  AST 15 - 41 U/L 15  ALT 0 - 44 U/L 13   CBC Latest Ref Rng & Units 05/13/2019  WBC 4.0 - 10.5 K/uL 10.1  Hemoglobin 12.0 - 15.0 g/dL  24.412.6  Hematocrit 01.036.0 - 46.0 % 38.1  Platelets 150 - 400 K/uL 248    No images are attached to the encounter.  No results found.  Assessment and plan-  1. Iron deficiency anemia, unspecified iron deficiency anemia type   2. History of gastric bypass   3. Vitamin D deficiency   Labs are reviewed and discussed with patient. Status post IV Venofer weekly treatment x4. CBC showed improved hemoglobinTo 12.6.  Ferritin improved from 7- to  67. Hold additional IV iron at this point. Patient has history of gastric bypass,  she is a high risk of iron deficiency.  Will need blood work periodically and follow-up for assessment of additional IV Venofer. Check copper level. Vitamin D deficiency,, continue take oral vitamin D supplementation.  Follow-up with PCP.  Thank you for allowing me to participate in the care of this patient.   Rickard PatienceZhou Alira Fretwell, MD, PhD Hematology Oncology [facility] Pager- [phone] 05/16/2019

## 2019-05-18 LAB — COPPER, SERUM: Copper: 132 ug/dL (ref 72–166)

## 2019-07-08 ENCOUNTER — Encounter: Payer: Self-pay | Admitting: Family Medicine

## 2019-07-11 MED ORDER — ONDANSETRON HCL 4 MG PO TABS: 4.0000 mg | ORAL_TABLET | Freq: Three times a day (TID) | ORAL | 2 refills | Status: DC | PRN

## 2019-08-12 ENCOUNTER — Other Ambulatory Visit: Payer: Self-pay

## 2019-08-12 ENCOUNTER — Inpatient Hospital Stay: Payer: Self-pay

## 2019-08-12 DIAGNOSIS — F319 Bipolar disorder, unspecified: Secondary | ICD-10-CM | POA: Insufficient documentation

## 2019-08-12 DIAGNOSIS — Z87891 Personal history of nicotine dependence: Secondary | ICD-10-CM | POA: Insufficient documentation

## 2019-08-12 DIAGNOSIS — Z808 Family history of malignant neoplasm of other organs or systems: Secondary | ICD-10-CM | POA: Insufficient documentation

## 2019-08-12 DIAGNOSIS — D509 Iron deficiency anemia, unspecified: Secondary | ICD-10-CM

## 2019-08-12 DIAGNOSIS — Z9884 Bariatric surgery status: Secondary | ICD-10-CM | POA: Insufficient documentation

## 2019-08-12 DIAGNOSIS — Z8051 Family history of malignant neoplasm of kidney: Secondary | ICD-10-CM | POA: Insufficient documentation

## 2019-08-12 DIAGNOSIS — D508 Other iron deficiency anemias: Secondary | ICD-10-CM | POA: Insufficient documentation

## 2019-08-12 DIAGNOSIS — Z800 Family history of malignant neoplasm of digestive organs: Secondary | ICD-10-CM | POA: Insufficient documentation

## 2019-08-12 DIAGNOSIS — E119 Type 2 diabetes mellitus without complications: Secondary | ICD-10-CM | POA: Insufficient documentation

## 2019-08-12 DIAGNOSIS — G4730 Sleep apnea, unspecified: Secondary | ICD-10-CM | POA: Insufficient documentation

## 2019-08-12 DIAGNOSIS — Z79899 Other long term (current) drug therapy: Secondary | ICD-10-CM | POA: Insufficient documentation

## 2019-08-12 DIAGNOSIS — E538 Deficiency of other specified B group vitamins: Secondary | ICD-10-CM | POA: Insufficient documentation

## 2019-08-12 DIAGNOSIS — Z8711 Personal history of peptic ulcer disease: Secondary | ICD-10-CM | POA: Insufficient documentation

## 2019-08-12 LAB — CBC WITH DIFFERENTIAL/PLATELET
Abs Immature Granulocytes: 0.03 10*3/uL (ref 0.00–0.07)
Basophils Absolute: 0.1 10*3/uL (ref 0.0–0.1)
Basophils Relative: 1 %
Eosinophils Absolute: 0.2 10*3/uL (ref 0.0–0.5)
Eosinophils Relative: 2 %
HCT: 42.2 % (ref 36.0–46.0)
Hemoglobin: 14 g/dL (ref 12.0–15.0)
Immature Granulocytes: 0 %
Lymphocytes Relative: 27 %
Lymphs Abs: 3 10*3/uL (ref 0.7–4.0)
MCH: 31 pg (ref 26.0–34.0)
MCHC: 33.2 g/dL (ref 30.0–36.0)
MCV: 93.6 fL (ref 80.0–100.0)
Monocytes Absolute: 0.5 10*3/uL (ref 0.1–1.0)
Monocytes Relative: 5 %
Neutro Abs: 7 10*3/uL (ref 1.7–7.7)
Neutrophils Relative %: 65 %
Platelets: 238 10*3/uL (ref 150–400)
RBC: 4.51 MIL/uL (ref 3.87–5.11)
RDW: 12.2 % (ref 11.5–15.5)
WBC: 10.8 10*3/uL — ABNORMAL HIGH (ref 4.0–10.5)
nRBC: 0 % (ref 0.0–0.2)

## 2019-08-12 LAB — FERRITIN: Ferritin: 28 ng/mL (ref 11–307)

## 2019-08-12 LAB — IRON AND TIBC
Iron: 129 ug/dL (ref 28–170)
Saturation Ratios: 32 % — ABNORMAL HIGH (ref 10.4–31.8)
TIBC: 409 ug/dL (ref 250–450)
UIBC: 280 ug/dL

## 2019-08-15 ENCOUNTER — Inpatient Hospital Stay: Payer: Self-pay

## 2019-08-15 ENCOUNTER — Other Ambulatory Visit: Payer: Self-pay

## 2019-08-15 ENCOUNTER — Inpatient Hospital Stay: Payer: Self-pay | Attending: Oncology | Admitting: Oncology

## 2019-08-15 ENCOUNTER — Encounter: Payer: Self-pay | Admitting: Oncology

## 2019-08-15 VITALS — BP 109/77 | HR 70 | Temp 98.0°F | Resp 18 | Wt 208.7 lb

## 2019-08-15 DIAGNOSIS — E559 Vitamin D deficiency, unspecified: Secondary | ICD-10-CM

## 2019-08-15 DIAGNOSIS — D509 Iron deficiency anemia, unspecified: Secondary | ICD-10-CM

## 2019-08-15 DIAGNOSIS — Z9884 Bariatric surgery status: Secondary | ICD-10-CM

## 2019-08-15 NOTE — Progress Notes (Signed)
Hematology/Oncology Progress Note [facility] [phone] [phone]  Patient Care Team: Dorcas CarrowJohnson, Megan P, DO as PCP - General (Family Medicine) Morene CrockerPotter, Zachary E, MD as Referring Physician (Neurology) Group, [facility](Behavioral Health) Darliss RidgelKapur, Aarti K, MD as Referring Physician (Psychiatry)   Name of the patient: Belinda PlantSusan [name]  [id number]  Nov 12, 1975  Date of visit: 08/15/19  PERTINENT ONCOLOGY HISTORY Bonnita LevanSusan D Karabin is a 43 y.o.afemale who has above oncology history reviewed by me today presented for follow up visit for management of iron deficiency anemia, history of gastric bypass Reports tolerating well, except that she developed swelling and pain of her left hand after IV iron infusion on 05/02/2019.  She applied ice and swelling/pain has improved to 2 days after the infusion and got worse again.  She called at that time and I spoke with her.  Advised patient to utilize ice, and take Tylenol if needed.  Patient reports that symptoms improved.   INTERVAL HISTORY-  43 y.o. female with history of gastric bypass iron deficient anemia present for follow-up. Reports feeling tried for the past 3 weeks.  Otherwise no new complaints.  Denies fever, chills, nausea, vomiting, diarrhea, chest pain, shortness of breath, abdominal pain, urinary symptoms, lower extremity swelling.   Review of systems- Review of Systems  Constitutional: Positive for fatigue. Negative for appetite change, chills and fever.  HENT:   Negative for hearing loss and voice change.   Eyes: Negative for eye problems.  Respiratory: Negative for chest tightness and cough.   Cardiovascular: Negative for chest pain.  Gastrointestinal: Negative for abdominal distention, abdominal pain and blood in stool.  Endocrine: Negative for hot flashes.  Genitourinary: Negative for difficulty urinating and frequency.   Musculoskeletal: Negative for arthralgias.  Skin:  Negative for itching and rash.  Neurological: Negative for extremity weakness.  Hematological: Negative for adenopathy.  Psychiatric/Behavioral: Negative for confusion.    Allergies  Allergen Reactions  . Depakote [Divalproex Sodium] Other (See Comments)    Tremors  . Acetaminophen Nausea Only    Patient Active Problem List   Diagnosis Date Noted  . IDA (iron deficiency anemia) 04/04/2019  . Bipolar 1 disorder, depressed (HCC) 03/22/2018  . Sedative overdose 03/22/2018  . 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 Nausea   . Arthropathy of lumbar facet joint 05/28/2016  . B12 deficiency 02/13/2016  . Bariatric surgery status 02/23/2014  . Morbid obesity (HCC) 10/14/2011     Past Medical History:  Diagnosis Date  . Arthritis    lower back  . Bipolar affective disorder (HCC)   . Controlled substance agreement signed 10/16/2017  . Deviated septum   . Diabetes mellitus without complication (HCC)   . GERD (gastroesophageal reflux disease)   . Headache(784.0)    migraines - none 2 months  . Peptic ulcer   . Pre-diabetes   . Schizophrenia (HCC)   . Sleep apnea   . Substance abuse [facility](HCC)      Past Surgical History:  Procedure Laterality Date  . ESOPHAGOGASTRODUODENOSCOPY  08/05/2012   Procedure: ESOPHAGOGASTRODUODENOSCOPY (EGD);  Surgeon: Kandis Cockingavid H Newman, MD;  [facility];  Service: General;  Laterality: N/A;  . ESOPHAGOGASTRODUODENOSCOPY (EGD) WITH PROPOFOL N/A 08/06/2016   Procedure: ESOPHAGOGASTRODUODENOSCOPY (EGD) WITH PROPOFOL;  Surgeon: Wyline MoodKiran Anna, MD;  [facility];  Service: Endoscopy;  Laterality: N/A;  . ESOPHAGOGASTRODUODENOSCOPY (EGD) WITH PROPOFOL N/A 11/07/2016   Procedure: ESOPHAGOGASTRODUODENOSCOPY (EGD) WITH PROPOFOL;  Surgeon: Wyline MoodKiran Anna, MD;  Location: [facility];  Service: Endoscopy;  Laterality: N/A;  . 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/2011   Procedure: LAPAROSCOPIC ROUX-EN-Y GASTRIC;  Surgeon: Mariella Saa, MD;  [facility]: [facility];  Service: General;  Laterality: N/A;  upper endoscopy  . SEPTOPLASTY  2003    Social History   Socioeconomic History  . Marital status: Married    Spouse name: Not on file  . Number of children: Not on file  . Years of education: Not on file  . Highest education level: Not on file  Occupational History  . Occupation: unemployed  Social Needs  . Financial resource strain: Not on file  . Food insecurity    Worry: Not on file    Inability: Not on file  . Transportation needs    Medical: Not on file    Non-medical: Not on file  Tobacco Use  . Smoking status: Former Smoker    Quit date: 04/03/2017    Years since quitting: 2.3  . Smokeless tobacco: Never Used  . Tobacco comment: only smoked for about 82months   Substance and Sexual Activity  . Alcohol use: Yes    Alcohol/week: 3.0 standard drinks    Types: 3 Standard drinks or equivalent per week    Comment: a few times a week  . Drug use: Not Currently    Types: Marijuana  . Sexual activity: Yes    Birth control/protection: None  Lifestyle  . Physical activity    Days per week: Not on file    Minutes per session: Not on file  . Stress: Not on file  Relationships  . Social Musician on phone: Not on file    Gets together: Not on file    Attends religious service: Not on file    Active member of club or organization: Not on file    Attends meetings of clubs or organizations: Not on file    Relationship status: Not on file  . Intimate partner violence    Fear of current or ex partner: Not on file    Emotionally abused: Not on file    Physically abused: Not on file    Forced sexual activity: Not on file  Other Topics Concern  . Not on file  Social History Narrative  . Not on file     Family History  Problem Relation Age of Onset  . Diabetes Mother   . Cancer Mother 81       brain, skin  . Cancer Father 33       kidney, bone, rectal  . Hyperlipidemia Father   . Hypertension Father   . Stroke  Father   . Cancer Paternal Aunt        breast  . Diabetes Brother   . Allergies Sister      Current Outpatient Medications:  .  B-D 3CC LUER-LOK SYR 25GX1" 25G X 1" 3 ML MISC, , Disp: , Rfl:  .  clonazePAM (KLONOPIN) 0.5 MG tablet, Take 0.5 mg by mouth 2 (two) times daily as needed for anxiety., Disp: , Rfl:  .  cyanocobalamin (,VITAMIN B-12,) 1000 MCG/ML injection, Inject 1 mL (1,000 mcg total) into the muscle once a week., Disp: 30 mL, Rfl: 1 .  lidocaine (LIDODERM) 5 %, Place 1 patch onto the skin daily. Remove & Discard patch within 12 hours or as directed by MD, Disp: 30 patch, Rfl: 0 .  ondansetron (ZOFRAN) 4 MG tablet, Take 1 tablet (4 mg total) by mouth every 8 (eight) hours as needed. for  nausea, Disp: 20 tablet, Rfl: 2   Physical exam:  Vitals:   08/15/19 1312  BP: 109/77  Pulse: 70  Resp: 18  Temp: 98 F (36.7 C)  Weight: 208 lb 11.2 oz (94.7 kg)   Physical Exam  Constitutional: She is oriented to person, place, and time. No distress.  HENT:  Head: Normocephalic and atraumatic.  Mouth/Throat: No oropharyngeal exudate.  Eyes: Pupils are equal, round, and reactive to light. EOM are normal. No scleral icterus.  Neck: Normal range of motion. Neck supple.  Cardiovascular: Normal rate and regular rhythm.  No murmur heard. Pulmonary/Chest: Effort normal. No respiratory distress. She has no rales. She exhibits no tenderness.  Abdominal: Soft. She exhibits no distension. There is no abdominal tenderness.  Musculoskeletal: Normal range of motion.        General: No edema.  Neurological: She is alert and oriented to person, place, and time. No cranial nerve deficit. She exhibits normal muscle tone. Coordination normal.  Skin: Skin is warm and dry. She is not diaphoretic. No erythema.  Psychiatric: Affect normal.       CMP Latest Ref Rng & Units 05/13/2019  Glucose 70 - 99 mg/dL 96  BUN 6 - 20 mg/dL 7  Creatinine 0.44 - 1.00 mg/dL 0.71  Sodium 135 - 145 mmol/L 138   Potassium 3.5 - 5.1 mmol/L 4.1  Chloride 98 - 111 mmol/L 107  CO2 22 - 32 mmol/L 24  Calcium 8.9 - 10.3 mg/dL 8.9  Total Protein 6.5 - 8.1 g/dL 7.1  Total Bilirubin 0.3 - 1.2 mg/dL 0.4  Alkaline Phos 38 - 126 U/L 62  AST 15 - 41 U/L 15  ALT 0 - 44 U/L 13   CBC Latest Ref Rng & Units 08/12/2019  WBC 4.0 - 10.5 K/uL 10.8(H)  Hemoglobin 12.0 - 15.0 g/dL 14.0  Hematocrit 36.0 - 46.0 % 42.2  Platelets 150 - 400 K/uL 238    No images are attached to the encounter.  No results found.  Assessment and plan-  1. Iron deficiency anemia, unspecified iron deficiency anemia type   2. History of gastric bypass   3. Vitamin D deficiency   Labs are reviewed and discussed with patient. Previously s/p IV venofer weeklyx x4.  Cbc showed stable hemoglobin is at 14 Iron panel showed a mixed picture. Ferritin 28, TIBC 409, slightly increased iron saturation.  Hold IV iron infusion today.   Repeat blood work in 3 months and re-evaluate.  Vitamin D deficiency,, continue take oral vitamin D supplementation.  Follow-up with PCP.  Earlie Server, MD, PhD Hematology Oncology [facility] at [facility] Pager- [phone] 08/15/2019

## 2019-08-15 NOTE — Progress Notes (Signed)
Patient here for follow up. Over the last 3 weeks patient has been more tired, blood sugars have been low and skin has been dry.

## 2019-09-26 ENCOUNTER — Telehealth: Payer: Self-pay | Admitting: Physician Assistant

## 2019-09-26 DIAGNOSIS — R112 Nausea with vomiting, unspecified: Secondary | ICD-10-CM

## 2019-09-26 DIAGNOSIS — H9209 Otalgia, unspecified ear: Secondary | ICD-10-CM

## 2019-09-26 NOTE — Progress Notes (Signed)
Based on what you shared with me, I feel your condition warrants further evaluation and I recommend that you be seen for a face to face office visit.  Your symptoms do not sound like they are due to motion sickness. I am concerned you my have an ear infection or something more serious causing your symptoms which I can not assess from a virtual standpoint. I would recommend seeking evaluation at an urgent care to better assess the cause of your symptoms.     NOTE: If you entered your credit card information for this eVisit, you will not be charged. You may see a "hold" on your card for the $35 but that hold will drop off and you will not have a charge processed.   If you are having a true medical emergency please call 911.      For an urgent face to face visit, [place] has five urgent care centers for your convenience:      NEW:  [facility] [place] at [place] Get Driving Directions [phone] [place], [place] [address] . 10 am - 6pm Monday - Friday    [place] [facility] [place]) Get Driving Directions [phone] [address] [place], [place] [address] . 10 am to 8 pm Monday-Friday . 12 pm to 8 pm [facility] at [facility] Get Driving Directions [phone] [address], [place], [place] [address] . 8 am to 8 pm Monday-Friday . 9 am to 6 pm Saturday . 11 am to 6 pm Sunday     [facility] at [facility] Get Driving Directions  [phone] [address] [place], [place] [address] . 8 am to 8 pm Monday-Friday . 8 am to 4 pm [facility] Urgent Care at  Get Driving Directions [phone] [address] [place], [place] [address] . 12 pm to 6 pm Monday-Friday      Your e-visit answers were reviewed by a board certified advanced clinical practitioner to complete your personal care plan.   Thank you for using e-Visits.   Greater than 5 minutes, yet less than 10 minutes of time have been spent researching, coordinating, and implementing care for this patient today.

## 2019-09-28 ENCOUNTER — Ambulatory Visit: Payer: Self-pay | Admitting: Family Medicine

## 2019-11-14 ENCOUNTER — Encounter: Payer: Self-pay | Admitting: Oncology

## 2019-11-14 ENCOUNTER — Inpatient Hospital Stay: Payer: Self-pay | Attending: Oncology

## 2019-11-14 ENCOUNTER — Inpatient Hospital Stay (HOSPITAL_BASED_OUTPATIENT_CLINIC_OR_DEPARTMENT_OTHER): Payer: Self-pay | Admitting: Oncology

## 2019-11-14 ENCOUNTER — Other Ambulatory Visit: Payer: Self-pay

## 2019-11-14 VITALS — Wt 188.0 lb

## 2019-11-14 DIAGNOSIS — Z8249 Family history of ischemic heart disease and other diseases of the circulatory system: Secondary | ICD-10-CM | POA: Insufficient documentation

## 2019-11-14 DIAGNOSIS — Z800 Family history of malignant neoplasm of digestive organs: Secondary | ICD-10-CM | POA: Insufficient documentation

## 2019-11-14 DIAGNOSIS — Z9884 Bariatric surgery status: Secondary | ICD-10-CM | POA: Insufficient documentation

## 2019-11-14 DIAGNOSIS — Z8051 Family history of malignant neoplasm of kidney: Secondary | ICD-10-CM | POA: Insufficient documentation

## 2019-11-14 DIAGNOSIS — F319 Bipolar disorder, unspecified: Secondary | ICD-10-CM | POA: Insufficient documentation

## 2019-11-14 DIAGNOSIS — F209 Schizophrenia, unspecified: Secondary | ICD-10-CM | POA: Insufficient documentation

## 2019-11-14 DIAGNOSIS — Z808 Family history of malignant neoplasm of other organs or systems: Secondary | ICD-10-CM | POA: Insufficient documentation

## 2019-11-14 DIAGNOSIS — Z803 Family history of malignant neoplasm of breast: Secondary | ICD-10-CM | POA: Insufficient documentation

## 2019-11-14 DIAGNOSIS — E119 Type 2 diabetes mellitus without complications: Secondary | ICD-10-CM | POA: Insufficient documentation

## 2019-11-14 DIAGNOSIS — D508 Other iron deficiency anemias: Secondary | ICD-10-CM | POA: Insufficient documentation

## 2019-11-14 DIAGNOSIS — Z79899 Other long term (current) drug therapy: Secondary | ICD-10-CM | POA: Insufficient documentation

## 2019-11-14 DIAGNOSIS — R110 Nausea: Secondary | ICD-10-CM | POA: Insufficient documentation

## 2019-11-14 DIAGNOSIS — G4730 Sleep apnea, unspecified: Secondary | ICD-10-CM | POA: Insufficient documentation

## 2019-11-14 DIAGNOSIS — D509 Iron deficiency anemia, unspecified: Secondary | ICD-10-CM

## 2019-11-14 DIAGNOSIS — Z87891 Personal history of nicotine dependence: Secondary | ICD-10-CM | POA: Insufficient documentation

## 2019-11-14 LAB — CBC WITH DIFFERENTIAL/PLATELET
Abs Immature Granulocytes: 0.03 10*3/uL (ref 0.00–0.07)
Basophils Absolute: 0.1 10*3/uL (ref 0.0–0.1)
Basophils Relative: 1 %
Eosinophils Absolute: 0.1 10*3/uL (ref 0.0–0.5)
Eosinophils Relative: 1 %
HCT: 41.9 % (ref 36.0–46.0)
Hemoglobin: 13.4 g/dL (ref 12.0–15.0)
Immature Granulocytes: 0 %
Lymphocytes Relative: 35 %
Lymphs Abs: 2.5 10*3/uL (ref 0.7–4.0)
MCH: 30.4 pg (ref 26.0–34.0)
MCHC: 32 g/dL (ref 30.0–36.0)
MCV: 95 fL (ref 80.0–100.0)
Monocytes Absolute: 0.4 10*3/uL (ref 0.1–1.0)
Monocytes Relative: 6 %
Neutro Abs: 4 10*3/uL (ref 1.7–7.7)
Neutrophils Relative %: 57 %
Platelets: 252 10*3/uL (ref 150–400)
RBC: 4.41 MIL/uL (ref 3.87–5.11)
RDW: 12 % (ref 11.5–15.5)
WBC: 7.1 10*3/uL (ref 4.0–10.5)
nRBC: 0 % (ref 0.0–0.2)

## 2019-11-14 LAB — IRON AND TIBC
Iron: 118 ug/dL (ref 28–170)
Saturation Ratios: 29 % (ref 10.4–31.8)
TIBC: 402 ug/dL (ref 250–450)
UIBC: 284 ug/dL

## 2019-11-14 LAB — FERRITIN: Ferritin: 16 ng/mL (ref 11–307)

## 2019-11-14 LAB — VITAMIN B12: Vitamin B-12: 701 pg/mL (ref 180–914)

## 2019-11-14 LAB — FOLATE: Folate: 12 ng/mL (ref >5.9)

## 2019-11-14 NOTE — Progress Notes (Signed)
Patient contacted for Mychart visit. Pt reports being nauseated, worse these past couple of weeks and she also stays tired most of the time. Pt states she has lost approx 20 pounds in the last month.

## 2019-11-14 NOTE — Progress Notes (Signed)
HEMATOLOGY-ONCOLOGY TeleHEALTH VISIT PROGRESS NOTE  I connected with Belinda [name] on 11/14/19 at  2:15 PM EST by video enabled telemedicine visit and verified that I am speaking with the correct person using two identifiers. I discussed the limitations, risks, security and privacy concerns of performing an evaluation and management service by telemedicine and the availability of in-person appointments. I also discussed with the patient that there may be a patient responsible charge related to this service. The patient expressed understanding and agreed to proceed.   Other persons participating in the visit and their role in the encounter:  None  Patient's location: Home  Provider's location: office Chief Complaint: Follow-up for iron deficiency anemia, gastric bypass.   INTERVAL HISTORY Belinda [name] is a 44 y.o. female who has above history reviewed by me today presents for follow up visit for management of iron deficiency anemia and gastric bypass. Problems and complaints are listed below:  Patient reports she feels better comparing to how she felt recently.  Recently she had experienced symptoms including nausea, decreased appetite and has lost 20 pounds for the past months.  For the past few days, symptoms starts to get better. She gives her self vitamin B12 injections once a week.  Review of Systems  Constitutional: Positive for appetite change and unexpected weight change. Negative for chills, fatigue and fever.  HENT:   Negative for hearing loss and voice change.   Eyes: Negative for eye problems.  Respiratory: Negative for chest tightness and cough.   Cardiovascular: Negative for chest pain.  Gastrointestinal: Positive for nausea. Negative for abdominal distention, abdominal pain and blood in stool.  Endocrine: Negative for hot flashes.  Genitourinary: Negative for difficulty urinating and frequency.   Musculoskeletal: Negative for arthralgias.  Skin: Negative for itching  and rash.  Neurological: Negative for extremity weakness.  Hematological: Negative for adenopathy.  Psychiatric/Behavioral: Negative for confusion.      Past Medical History:  Diagnosis Date  . Arthritis    lower back  . Bipolar affective disorder (HCC)   . Controlled substance agreement signed 10/16/2017  . Deviated septum   . Diabetes mellitus without complication (HCC)   . GERD (gastroesophageal reflux disease)   . Headache(784.0)    migraines - none 2 months  . Peptic ulcer   . Pre-diabetes   . Schizophrenia (HCC)   . Sleep apnea   . Substance abuse [facility])    Past Surgical History:  Procedure Laterality Date  . ESOPHAGOGASTRODUODENOSCOPY  08/05/2012   Procedure: ESOPHAGOGASTRODUODENOSCOPY (EGD);  Surgeon: Kandis Cocking, MD;  [facility];  Service: General;  Laterality: N/A;  . ESOPHAGOGASTRODUODENOSCOPY (EGD) WITH PROPOFOL N/A 08/06/2016   Procedure: ESOPHAGOGASTRODUODENOSCOPY (EGD) WITH PROPOFOL;  Surgeon: Wyline Mood, MD;  [facility];  Service: Endoscopy;  Laterality: N/A;  . ESOPHAGOGASTRODUODENOSCOPY (EGD) WITH PROPOFOL N/A 11/07/2016   Procedure: ESOPHAGOGASTRODUODENOSCOPY (EGD) WITH PROPOFOL;  Surgeon: Wyline Mood, MD;  Location: [facility];  Service: Endoscopy;  Laterality: N/A;  . 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/2011   Procedure: LAPAROSCOPIC ROUX-EN-Y GASTRIC;  Surgeon: Mariella Saa, MD;  Location: [facility];  Service: General;  Laterality: N/A;  upper endoscopy  . SEPTOPLASTY  2003    Family History  Problem Relation Age of Onset  . Diabetes Mother   . Cancer Mother 26       brain, skin  . Cancer Father 63       kidney, bone, rectal  . Hyperlipidemia Father   . Hypertension Father   .  Stroke Father   . Cancer Paternal Aunt        breast  . Diabetes Brother   . Allergies Sister     Social History   Socioeconomic History  . Marital status: Married    Spouse name: Not on file  . Number of children: Not on file  . Years of  education: Not on file  . Highest education level: Not on file  Occupational History  . Occupation: unemployed  Tobacco Use  . Smoking status: Former Smoker    Quit date: 04/03/2017    Years since quitting: 2.6  . Smokeless tobacco: Never Used  . Tobacco comment: only smoked for about 62months   Substance and Sexual Activity  . Alcohol use: Yes    Alcohol/week: 3.0 standard drinks    Types: 3 Standard drinks or equivalent per week    Comment: a few times a week  . Drug use: Not Currently    Types: Marijuana  . Sexual activity: Yes    Birth control/protection: None  Other Topics Concern  . Not on file  Social History Narrative  . Not on file   Social Determinants of Health   Financial Resource Strain:   . Difficulty of Paying Living Expenses: Not on file  Food Insecurity:   . Worried About Charity fundraiser in the Last Year: Not on file  . Ran Out of Food in the Last Year: Not on file  Transportation Needs:   . Lack of Transportation (Medical): Not on file  . Lack of Transportation (Non-Medical): Not on file  Physical Activity:   . Days of Exercise per Week: Not on file  . Minutes of Exercise per Session: Not on file  Stress:   . Feeling of Stress : Not on file  Social Connections:   . Frequency of Communication with Friends and Family: Not on file  . Frequency of Social Gatherings with Friends and Family: Not on file  . Attends Religious Services: Not on file  . Active Member of Clubs or Organizations: Not on file  . Attends Archivist Meetings: Not on file  . Marital Status: Not on file  Intimate Partner Violence:   . Fear of Current or Ex-Partner: Not on file  . Emotionally Abused: Not on file  . Physically Abused: Not on file  . Sexually Abused: Not on file    Current Outpatient Medications on File Prior to Visit  Medication Sig Dispense Refill  . B-D 3CC LUER-LOK SYR 25GX1" 25G X 1" 3 ML MISC     . clonazePAM (KLONOPIN) 0.5 MG tablet Take 0.5 mg by  mouth 2 (two) times daily as needed for anxiety.    . cyanocobalamin (,VITAMIN B-12,) 1000 MCG/ML injection Inject 1 mL (1,000 mcg total) into the muscle once a week. 30 mL 1  . lidocaine (LIDODERM) 5 % Place 1 patch onto the skin daily. Remove & Discard patch within 12 hours or as directed by MD 30 patch 0  . ondansetron (ZOFRAN) 4 MG tablet Take 1 tablet (4 mg total) by mouth every 8 (eight) hours as needed. for nausea 20 tablet 2   No current facility-administered medications on file prior to visit.    Allergies  Allergen Reactions  . Depakote [Divalproex Sodium] Other (See Comments)    Tremors  . Acetaminophen Nausea Only       Observations/Objective: Today's Vitals   11/14/19 1357  Weight: 188 lb (85.3 kg)  PainSc: 0-No pain  Body mass index is 34.39 kg/m.  Physical Exam  Constitutional: No distress.  Neurological: She is alert.  Psychiatric: Mood normal.    CBC    Component Value Date/Time   WBC 7.1 11/14/2019 1037   RBC 4.41 11/14/2019 1037   HGB 13.4 11/14/2019 1037   HGB 11.6 03/21/2019 1458   HCT 41.9 11/14/2019 1037   HCT 35.8 03/21/2019 1458   PLT 252 11/14/2019 1037   PLT 277 03/21/2019 1458   MCV 95.0 11/14/2019 1037   MCV 83 03/21/2019 1458   MCH 30.4 11/14/2019 1037   MCHC 32.0 11/14/2019 1037   RDW 12.0 11/14/2019 1037   RDW 15.1 03/21/2019 1458   LYMPHSABS 2.5 11/14/2019 1037   LYMPHSABS 2.6 03/21/2019 1458   MONOABS 0.4 11/14/2019 1037   EOSABS 0.1 11/14/2019 1037   EOSABS 0.1 03/21/2019 1458   BASOSABS 0.1 11/14/2019 1037   BASOSABS 0.1 03/21/2019 1458    CMP     Component Value Date/Time   NA 138 05/13/2019 1333   NA 142 03/21/2019 1458   K 4.1 05/13/2019 1333   CL 107 05/13/2019 1333   CO2 24 05/13/2019 1333   GLUCOSE 96 05/13/2019 1333   BUN 7 05/13/2019 1333   BUN 7 03/21/2019 1458   CREATININE 0.71 05/13/2019 1333   CREATININE 0.88 11/14/2011 1728   CALCIUM 8.9 05/13/2019 1333   PROT 7.1 05/13/2019 1333   PROT 6.6  03/21/2019 1458   ALBUMIN 4.0 05/13/2019 1333   ALBUMIN 4.1 03/21/2019 1458   AST 15 05/13/2019 1333   ALT 13 05/13/2019 1333   ALKPHOS 62 05/13/2019 1333   BILITOT 0.4 05/13/2019 1333   BILITOT 0.2 03/21/2019 1458   GFRNONAA >60 05/13/2019 1333   GFRAA >60 05/13/2019 1333     Assessment and Plan: 1. Iron deficiency anemia, unspecified iron deficiency anemia type   2. History of gastric bypass     Labs reviewed and discussed with patient.  Her hemoglobin has been stable.  Iron panel shows slightly decreased ferritin level to 16 compared to 28 during last visit. In the context of history of gastric bypass, high risk of developing iron deficiency, I will proceed with IV Venofer 200 mg x 1 tomorrow.  Nausea, weight loss, decreased appetite, per patient, the symptoms are clinically getting better.  She wonders if she has got a stomach flu.  Discussed with patient encourage oral hydration.  If symptoms persist, she needs to call her primary care doctor's office for further evaluation.  She voices understanding.  Follow Up Instructions: 6 months   I discussed the assessment and treatment plan with the patient. The patient was provided an opportunity to ask questions and all were answered. The patient agreed with the plan and demonstrated an understanding of the instructions.  The patient was advised to call back or seek an in-person evaluation if the symptoms worsen or if the condition fails to improve as anticipated.   Rickard Patience, MD 11/14/2019 4:49 PM

## 2019-11-15 ENCOUNTER — Inpatient Hospital Stay: Payer: Self-pay

## 2019-11-15 ENCOUNTER — Other Ambulatory Visit: Payer: Self-pay

## 2019-11-15 VITALS — BP 118/72 | HR 75 | Temp 99.0°F | Resp 20

## 2019-11-15 DIAGNOSIS — D509 Iron deficiency anemia, unspecified: Secondary | ICD-10-CM

## 2019-11-15 MED ORDER — SODIUM CHLORIDE 0.9 % IV SOLN
INTRAVENOUS | Status: DC
  Filled 2019-11-15: qty 250

## 2019-11-15 MED ORDER — IRON SUCROSE 20 MG/ML IV SOLN
200.0000 mg | Freq: Once | INTRAVENOUS | Status: AC
  Administered 2019-11-15: 200 mg via INTRAVENOUS
  Filled 2019-11-15: qty 10

## 2019-11-17 ENCOUNTER — Ambulatory Visit: Payer: Self-pay | Admitting: Oncology

## 2019-11-17 ENCOUNTER — Ambulatory Visit: Payer: Self-pay

## 2019-11-21 ENCOUNTER — Encounter: Payer: Self-pay | Admitting: Family Medicine

## 2019-11-24 MED ORDER — ONDANSETRON HCL 4 MG PO TABS: 4.0000 mg | ORAL_TABLET | Freq: Three times a day (TID) | ORAL | 2 refills | Status: DC | PRN

## 2020-03-05 ENCOUNTER — Other Ambulatory Visit: Payer: Self-pay | Admitting: Family Medicine

## 2020-03-06 NOTE — Telephone Encounter (Signed)
Routing to provider  

## 2020-03-06 NOTE — Telephone Encounter (Signed)
Requested medication (s) are due for refill today: no  Requested medication (s) are on the active medication list: yes  Last refill:  02/12/2020  Future visit scheduled: no  Notes to clinic:  this refill cannot be delegated    Requested Prescriptions  Pending Prescriptions Disp Refills   ondansetron (ZOFRAN) 4 MG tablet [Pharmacy Med Name: ONDANSETRON HCL 4 MG TABLET] 20 tablet 5    Sig: Take 1 tablet (4 mg total) by mouth every 8 (eight) hours as needed. for nausea      Not Delegated - Gastroenterology: Antiemetics Failed - 03/05/2020  9:34 PM      Failed - This refill cannot be delegated      Failed - Valid encounter within last 6 months    Recent Outpatient Visits           11 months ago Fatigue, unspecified type   [facility] [place], Megan P, DO   1 year ago B12 deficiency   [facility] [place], [place], DO   1 year ago Influenza-like illness   [facility] [place], [place], DO   1 year ago Chronic bilateral low back pain with left-sided sciatica   [facility] [place], [place], DO   2 years ago Palpitations   [facility] Dorcas Carrow, DO       Future Appointments             In 2 months Rickard Patience, MD [facility] Medical Oncology

## 2020-03-07 ENCOUNTER — Encounter: Payer: Self-pay | Admitting: Family Medicine

## 2020-03-09 ENCOUNTER — Encounter: Payer: Self-pay | Admitting: Family Medicine

## 2020-03-09 ENCOUNTER — Telehealth (INDEPENDENT_AMBULATORY_CARE_PROVIDER_SITE_OTHER): Payer: Self-pay | Admitting: Family Medicine

## 2020-03-09 VITALS — HR 85

## 2020-03-09 DIAGNOSIS — J069 Acute upper respiratory infection, unspecified: Secondary | ICD-10-CM

## 2020-03-09 MED ORDER — BENZONATATE 200 MG PO CAPS: 200.0000 mg | ORAL_CAPSULE | Freq: Two times a day (BID) | ORAL | 2 refills | Status: DC | PRN

## 2020-03-09 MED ORDER — AZITHROMYCIN 250 MG PO TABS: ORAL_TABLET | ORAL | 0 refills | Status: DC

## 2020-03-09 MED ORDER — PREDNISONE 10 MG PO TABS: ORAL_TABLET | ORAL | 0 refills | Status: DC

## 2020-03-09 NOTE — Progress Notes (Signed)
Pulse 85   LMP 02/19/2020   SpO2 98%    Subjective:    Patient ID: Belinda [name], female    DOB: 02-12-1976, 44 y.o.   MRN: [id number]  HPI: Belinda [name] is a 44 y.o. female  Chief Complaint  Patient presents with  . lung pain  . Cough    unproductive  . ear stopped up  . Nasal Congestion  . Breathing Problem  . covid test    negative 03/08/2020   UPPER RESPIRATORY TRACT INFECTION- had covid test yesterday that was negative Duration: 2 days Worst symptom: cough and runny nose Fever: no Cough: yes Shortness of breath: no Wheezing: no Chest pain: yes, with cough Chest tightness: no Chest congestion: yes Nasal congestion: yes Runny nose: yes Post nasal drip: yes Sneezing: no Sore throat: yes Swollen glands: no Sinus pressure: no Headache: no Face pain: no Toothache: no Ear pain: no  Ear pressure: yes bilateral Eyes red/itching:no Eye drainage/crusting: no  Vomiting: no Rash: no Fatigue: yes Sick contacts: yes Strep contacts: no  Context: worse Recurrent sinusitis: no Relief with OTC cold/cough medications: no  Treatments attempted: cold/sinus, mucinex, anti-histamine, pseudoephedrine and cough syrup   Relevant past medical, surgical, family and social history reviewed and updated as indicated. Interim medical history since our last visit reviewed. Allergies and medications reviewed and updated.  Review of Systems  Constitutional: Positive for fatigue. Negative for activity change, appetite change, chills, diaphoresis, fever and unexpected weight change.  HENT: Positive for postnasal drip and sore throat. Negative for congestion, dental problem, drooling, ear discharge, ear pain, facial swelling, hearing loss, mouth sores, nosebleeds, rhinorrhea, sinus pressure, sinus pain, sneezing, tinnitus, trouble swallowing and voice change.   Respiratory: Positive for cough, chest tightness and shortness of breath. Negative for apnea, choking, wheezing and  stridor.   Cardiovascular: Positive for chest pain. Negative for palpitations and leg swelling.  Gastrointestinal: Negative.   Musculoskeletal: Negative.   Neurological: Negative.   Psychiatric/Behavioral: Negative.     Per HPI unless specifically indicated above     Objective:    Pulse 85   LMP 02/19/2020   SpO2 98%   Wt Readings from Last 3 Encounters:  11/14/19 188 lb (85.3 kg)  08/15/19 208 lb 11.2 oz (94.7 kg)  05/16/19 208 lb 3.2 oz (94.4 kg)    Physical Exam Vitals and nursing note reviewed.  Constitutional:      General: She is not in acute distress.    Appearance: Normal appearance. She is not ill-appearing, toxic-appearing or diaphoretic.  HENT:     Head: Normocephalic and atraumatic.     Right Ear: External ear normal.     Left Ear: External ear normal.     Nose: Nose normal.     Mouth/Throat:     Mouth: Mucous membranes are moist.     Pharynx: Oropharynx is clear.  Eyes:     General: No scleral icterus.       Right eye: No discharge.        Left eye: No discharge.     Conjunctiva/sclera: Conjunctivae normal.     Pupils: Pupils are equal, round, and reactive to light.  Pulmonary:     Effort: Pulmonary effort is normal. No respiratory distress.     Comments: Speaking in full sentences Musculoskeletal:        General: Normal range of motion.     Cervical back: Normal range of motion.  Skin:    Coloration: Skin is not jaundiced  or pale.     Findings: No bruising, erythema, lesion or rash.  Neurological:     Mental Status: She is alert and oriented to person, place, and time. Mental status is at baseline.  Psychiatric:        Mood and Affect: Mood normal.        Behavior: Behavior normal.        Thought Content: Thought content normal.        Judgment: Judgment normal.     Results for orders placed or performed in visit on 11/14/19  Folate  Result Value Ref Range   Folate 12.0 >5.9 ng/mL  Vitamin B12  Result Value Ref Range   Vitamin B-12 701  180 - 914 pg/mL  Iron and TIBC  Result Value Ref Range   Iron 118 28 - 170 ug/dL   TIBC 402 250 - 450 ug/dL   Saturation Ratios 29 10.4 - 31.8 %   UIBC 284 ug/dL  Ferritin  Result Value Ref Range   Ferritin 16 11 - 307 ng/mL  CBC with Differential  Result Value Ref Range   WBC 7.1 4.0 - 10.5 K/uL   RBC 4.41 3.87 - 5.11 MIL/uL   Hemoglobin 13.4 12.0 - 15.0 g/dL   HCT 41.9 36.0 - 46.0 %   MCV 95.0 80.0 - 100.0 fL   MCH 30.4 26.0 - 34.0 pg   MCHC 32.0 30.0 - 36.0 g/dL   RDW 12.0 11.5 - 15.5 %   Platelets 252 150 - 400 K/uL   nRBC 0.0 0.0 - 0.2 %   Neutrophils Relative % 57 %   Neutro Abs 4.0 1.7 - 7.7 K/uL   Lymphocytes Relative 35 %   Lymphs Abs 2.5 0.7 - 4.0 K/uL   Monocytes Relative 6 %   Monocytes Absolute 0.4 0.1 - 1.0 K/uL   Eosinophils Relative 1 %   Eosinophils Absolute 0.1 0.0 - 0.5 K/uL   Basophils Relative 1 %   Basophils Absolute 0.1 0.0 - 0.1 K/uL   Immature Granulocytes 0 %   Abs Immature Granulocytes 0.03 0.00 - 0.07 K/uL      Assessment & Plan:   Problem List Items Addressed This Visit    None    Visit Diagnoses    Upper respiratory tract infection, unspecified type    -  Primary   Will treat with azithromycin and prednisone. Tessalon perles for comfort. Call with any concerns. Continue to monitor. if not better by Monday- CXR.   Relevant Medications   azithromycin (ZITHROMAX) 250 MG tablet       Follow up plan: Return As able for physcial.    . This visit was completed via MyChart due to the restrictions of the COVID-19 pandemic. All issues as above were discussed and addressed. Physical exam was done as above through visual confirmation on MyChart. If it was felt that the patient should be evaluated in the office, they were directed there. The patient verbally consented to this visit. . Location of the patient: parking lot . Location of the provider: work . Those involved with this call:  . Provider: Park Liter, DO . CMA: Lauretta Grill,  RMA . Front Desk/Registration: Don Perking  . Time spent on call: 15 minutes with patient face to face via video conference. More than 50% of this time was spent in counseling and coordination of care. 23 minutes total spent in review of patient's record and preparation of their chart.

## 2020-03-14 ENCOUNTER — Ambulatory Visit: Payer: Self-pay | Admitting: *Deleted

## 2020-03-14 ENCOUNTER — Telehealth: Payer: Self-pay | Admitting: Family Medicine

## 2020-03-14 NOTE — Telephone Encounter (Signed)
Pt called in c/o still having URI symptoms that have not improved from when she saw Dr. Laural Benes on 03/09/2020.   She was put on prednisone, an antibiotic and Tessilon Pearls. She still has the cough, chest soreness and inability to take a deep breath not improved from 03/09/2020.   Denies chest pain or shortness of breath just "can't take a deep breath very well".  Denies any cardiac or pulmonary issues.    She has had 2 COVID-19 tests with the last one being done after talking with Dr. Laural Benes on 03/09/2020 and they both were negative.  The protocol is not for heartburn but was the closest protocol for her symptoms.  I made her a MyChart video appt with Roosvelt Maser for today at 3:40 PM.  The COVID-19 questionnaire indicated a video visit.   She did a MyChart visit with Dr. Laural Benes last week so she is familiar with the process and was agreeable to doing it again.  I sent my notes to [facility] for [employer].   (Dr. Laural Benes did not have any openings).   Reason for Disposition  [1] Patient says chest pain feels exactly the same as previously diagnosed "heartburn" AND [2] describes burning in chest AND [3] accompanying sour taste in mouth    Has URI that she saw Dr Laural Benes for last THursday.   Same symptoms  Answer Assessment - Initial Assessment Questions 1. LOCATION: "Where does it hurt?"       Dr. Laural Benes treated me for a URI.  My symptoms are still there.   I'm not short of breath.  I feel like I can't get a deep breath.  My chest still hurts.  She told me if I wasn't better to call her.  Denies cardiac problems or COPD, asthma. 2. RADIATION: "Does the pain go anywhere else?" (e.g., into neck, jaw, arms, back)     No 3. ONSET: "When did the chest pain begin?" (Minutes, hours or days)      I was having this when I saw Dr. Laural Benes last Thursday. 4. PATTERN "Does the pain come and go, or has it been constant since it started?"  "Does it get worse with exertion?"      It comes and  goes.  I was put on prednisone, antibiotic and Tessilon Pearls. 5. DURATION: "How long does it last" (e.g., seconds, minutes, hours)     Since last Thursday. 6. SEVERITY: "How bad is the pain?"  (e.g., Scale 1-10; mild, moderate, or severe)    - MILD (1-3): doesn't interfere with normal activities     - MODERATE (4-7): interferes with normal activities or awakens from sleep    - SEVERE (8-10): excruciating pain, unable to do any normal activities       *No Answer* 7. CARDIAC RISK FACTORS: "Do you have any history of heart problems or risk factors for heart disease?" (e.g., angina, prior heart attack; diabetes, high blood pressure, high cholesterol, smoker, or strong family history of heart disease)     No 8. PULMONARY RISK FACTORS: "Do you have any history of lung disease?"  (e.g., blood clots in lung, asthma, emphysema, birth control pills)     No 9. CAUSE: "What do you think is causing the chest pain?"     URI 10. OTHER SYMPTOMS: "Do you have any other symptoms?" (e.g., dizziness, nausea, vomiting, sweating, fever, difficulty breathing, cough)       No 11. PREGNANCY: "Is there any chance you are pregnant?" "When  was your last menstrual period?"      Not asked  Protocols used: CHEST PAIN-A-AH

## 2020-03-15 ENCOUNTER — Other Ambulatory Visit
Admission: RE | Admit: 2020-03-15 | Discharge: 2020-03-15 | Disposition: A | Discharge status: Home / Self Care | Payer: Self-pay | Source: Ambulatory Visit | Attending: Student | Admitting: Student

## 2020-03-15 ENCOUNTER — Telehealth: Payer: Self-pay | Admitting: Family Medicine

## 2020-03-15 DIAGNOSIS — R05 Cough: Secondary | ICD-10-CM | POA: Insufficient documentation

## 2020-03-15 DIAGNOSIS — R0602 Shortness of breath: Secondary | ICD-10-CM | POA: Insufficient documentation

## 2020-03-15 LAB — FIBRIN DERIVATIVES D-DIMER (ARMC ONLY): Fibrin derivatives D-dimer (ARMC): 245.38 ng/mL (FEU) (ref 0.00–499.00)

## 2020-03-22 ENCOUNTER — Other Ambulatory Visit: Payer: Self-pay | Admitting: Family Medicine

## 2020-03-22 NOTE — Telephone Encounter (Signed)
Requested  medications are  due for refill today yes  Requested medications are on the active medication list yes  Last refill 3/15  Future visit scheduled no  Last visit July 2020  Notes to clinic Failed protocol of visit within 12 months

## 2020-03-22 NOTE — Telephone Encounter (Signed)
Routing to provider  

## 2020-04-11 ENCOUNTER — Encounter: Payer: Self-pay | Admitting: Family Medicine

## 2020-04-17 ENCOUNTER — Ambulatory Visit: Payer: Self-pay | Admitting: Family Medicine

## 2020-04-23 ENCOUNTER — Other Ambulatory Visit: Payer: Self-pay

## 2020-04-23 ENCOUNTER — Ambulatory Visit (INDEPENDENT_AMBULATORY_CARE_PROVIDER_SITE_OTHER): Payer: Self-pay | Admitting: Family Medicine

## 2020-04-23 ENCOUNTER — Encounter: Payer: Self-pay | Admitting: Family Medicine

## 2020-04-23 VITALS — BP 108/73 | HR 71 | Temp 98.3°F | Wt 183.0 lb

## 2020-04-23 DIAGNOSIS — F319 Bipolar disorder, unspecified: Secondary | ICD-10-CM

## 2020-04-23 DIAGNOSIS — G44201 Tension-type headache, unspecified, intractable: Secondary | ICD-10-CM

## 2020-04-23 MED ORDER — HYDROXYZINE PAMOATE 100 MG PO CAPS: 100.0000 mg | ORAL_CAPSULE | Freq: Three times a day (TID) | ORAL | 3 refills | Status: DC | PRN

## 2020-04-23 MED ORDER — KETOROLAC TROMETHAMINE 60 MG/2ML IM SOLN
60.0000 mg | Freq: Once | INTRAMUSCULAR | Status: AC
  Administered 2020-04-23: 60 mg via INTRAMUSCULAR

## 2020-04-23 NOTE — Progress Notes (Signed)
BP 108/73   Pulse 71   Temp 98.3 F (36.8 C) (Oral)   Wt 183 lb (83 kg)   SpO2 98%   BMI 33.47 kg/m    Subjective:    Patient ID: Belinda [name], female    DOB: 1976/03/30, 44 y.o.   MRN: [id number]  HPI: Belinda [name] is a 44 y.o. female  Chief Complaint  Patient presents with  . Headache    since last Friday  . Medication Refill    clonazepam   HEADACHE- since Friday, found out her job was over and hasn't gone away. Head is hurting and ears are ringing.  Duration: 3 days Onset: sudden Severity: severe Quality: dull ache, and burning in her face Frequency: intermittent Location: behind her ears and into the back of her neck Headache duration: pretty constant, goes away with the oils and then comes back Radiation: no Time of day headache occurs: near constant Alleviating factors: essential oils Aggravating factors: movement Headache status at time of visit: current headache Treatments attempted: Treatments attempted: rest, ice, APAP, ibuprofen and aleve", excedrine   Aura: yes- not this time Nausea: yes  Vomiting: no Photophobia:  no Phonophobia:  no Effect on social functioning:  yes Confusion:  no Gait disturbance/ataxia:  no Behavioral changes:  no Fevers:  no  ANXIETY/STRESS Duration:exacerbated Anxious mood: yes  Excessive worrying: yes Irritability: yes  Sweating: yes Nausea: yes Palpitations:yes Hyperventilation: yes Panic attacks: yes Agoraphobia: yes  Obscessions/compulsions: no Depressed mood: no Depression screen [facility] 2/9 04/23/2020 03/15/2018 06/23/2017 02/13/2016 08/09/2015  Decreased Interest 0 1 1 0 0  Down, Depressed, Hopeless 0 0 1 0 0  PHQ - 2 Score 0 1 2 0 0  Altered sleeping 1 2 0 - -  Tired, decreased energy 1 2 3  - -  Change in appetite 1 1 0 - -  Feeling bad or failure about yourself  1 1 0 - -  Trouble concentrating 0 2 0 - -  Moving slowly or fidgety/restless 0 1 0 - -  Suicidal thoughts 0 0 0 - -  PHQ-9 Score 4 10 5  -  -  Difficult doing work/chores Somewhat difficult Somewhat difficult - - -   GAD 7 : Generalized Anxiety Score 04/23/2020 03/15/2018  Nervous, Anxious, on Edge 1 1  Control/stop worrying 1 2  Worry too much - different things 0 2  Trouble relaxing 1 2  Restless 1 1  Easily annoyed or irritable 0 1  Afraid - awful might happen 0 1  Total GAD 7 Score 4 10  Anxiety Difficulty Not difficult at all Somewhat difficult   Anhedonia: no Weight changes: no Insomnia: no   Hypersomnia: no Fatigue/loss of energy: yes Feelings of worthlessness: no Feelings of guilt: no Impaired concentration/indecisiveness: no Suicidal ideations: no  Crying spells: no Recent Stressors/Life Changes: yes   Relationship problems: no   Family stress: no     Financial stress: yes    Job stress: yes    Recent death/loss: no  Relevant past medical, surgical, family and social history reviewed and updated as indicated. Interim medical history since our last visit reviewed. Allergies and medications reviewed and updated.  Review of Systems  Constitutional: Negative.   Respiratory: Negative.   Cardiovascular: Negative.   Gastrointestinal: Negative.   Musculoskeletal: Negative.   Neurological: Positive for headaches. Negative for dizziness, tremors, seizures, syncope, facial asymmetry, speech difficulty, weakness, light-headedness and numbness.  Psychiatric/Behavioral: Negative for agitation, behavioral problems, confusion, decreased concentration, dysphoric mood,  hallucinations, self-injury, sleep disturbance and suicidal ideas. The patient is nervous/anxious. The patient is not hyperactive.     Per HPI unless specifically indicated above     Objective:    BP 108/73   Pulse 71   Temp 98.3 F (36.8 C) (Oral)   Wt 183 lb (83 kg)   SpO2 98%   BMI 33.47 kg/m   Wt Readings from Last 3 Encounters:  04/23/20 183 lb (83 kg)  11/14/19 188 lb (85.3 kg)  08/15/19 208 lb 11.2 oz (94.7 kg)    Physical  Exam Vitals and nursing note reviewed.  Constitutional:      General: She is not in acute distress.    Appearance: Normal appearance. She is ill-appearing. She is not toxic-appearing or diaphoretic.  HENT:     Head: Normocephalic and atraumatic.     Right Ear: External ear normal.     Left Ear: External ear normal.     Nose: Nose normal.     Mouth/Throat:     Mouth: Mucous membranes are moist.     Pharynx: Oropharynx is clear.  Eyes:     General: No scleral icterus.       Right eye: No discharge.        Left eye: No discharge.     Extraocular Movements: Extraocular movements intact.     Conjunctiva/sclera: Conjunctivae normal.     Pupils: Pupils are equal, round, and reactive to light.  Cardiovascular:     Rate and Rhythm: Normal rate and regular rhythm.     Pulses: Normal pulses.     Heart sounds: Normal heart sounds. No murmur heard.  No friction rub. No gallop.   Pulmonary:     Effort: Pulmonary effort is normal. No respiratory distress.     Breath sounds: Normal breath sounds. No stridor. No wheezing, rhonchi or rales.  Chest:     Chest wall: No tenderness.  Musculoskeletal:        General: Normal range of motion.     Cervical back: Normal range of motion and neck supple.  Skin:    General: Skin is warm and dry.     Capillary Refill: Capillary refill takes less than 2 seconds.     Coloration: Skin is not jaundiced or pale.     Findings: No bruising, erythema, lesion or rash.  Neurological:     General: No focal deficit present.     Mental Status: She is alert and oriented to person, place, and time. Mental status is at baseline.  Psychiatric:        Mood and Affect: Mood normal.        Behavior: Behavior normal.        Thought Content: Thought content normal.        Judgment: Judgment normal.     Results for orders placed or performed during the hospital encounter of 03/15/20  Fibrin derivatives D-Dimer ([facility] only)  Result Value Ref Range   Fibrin derivatives  D-dimer ([facility]) 245.38 0.00 - 499.00 ng/mL (FEU)      Assessment & Plan:   Problem List Items Addressed This Visit      Other   Bipolar 1 disorder, depressed (HCC)    Has occasional panic attacks. Discussed that with her previous OD, I cannot give her any controlled substances. Offered referral back to psych- she would like to hold off on that for now. Rx for hydroxyzine given. Call with any concerns.        Other  Visit Diagnoses    Acute intractable tension-type headache    -  Primary   Will treat with toradol shot today. If not improving, let us know. Continue to monitor.    Relevant Medications   ketorolac (TORADOL) injection 60 mg       Follow up plan: No follow-ups on file.

## 2020-04-23 NOTE — Assessment & Plan Note (Signed)
Has occasional panic attacks. Discussed that with her previous OD, I cannot give her any controlled substances. Offered referral back to psych- she would like to hold off on that for now. Rx for hydroxyzine given. Call with any concerns.

## 2020-04-30 ENCOUNTER — Encounter: Payer: Self-pay | Admitting: Family Medicine

## 2020-04-30 ENCOUNTER — Other Ambulatory Visit: Payer: Self-pay

## 2020-04-30 ENCOUNTER — Ambulatory Visit (INDEPENDENT_AMBULATORY_CARE_PROVIDER_SITE_OTHER): Payer: Self-pay | Admitting: Family Medicine

## 2020-04-30 VITALS — BP 123/80 | HR 60 | Temp 98.7°F | Ht 62.0 in | Wt 186.2 lb

## 2020-04-30 DIAGNOSIS — Z0000 Encounter for general adult medical examination without abnormal findings: Secondary | ICD-10-CM

## 2020-04-30 DIAGNOSIS — Z8639 Personal history of other endocrine, nutritional and metabolic disease: Secondary | ICD-10-CM

## 2020-04-30 DIAGNOSIS — Z1231 Encounter for screening mammogram for malignant neoplasm of breast: Secondary | ICD-10-CM

## 2020-04-30 DIAGNOSIS — E538 Deficiency of other specified B group vitamins: Secondary | ICD-10-CM

## 2020-04-30 DIAGNOSIS — D509 Iron deficiency anemia, unspecified: Secondary | ICD-10-CM

## 2020-04-30 LAB — MICROALBUMIN, URINE WAIVED
Creatinine, Urine Waived: 10 mg/dL (ref 10–300)
Microalb, Ur Waived: 10 mg/L (ref 0–19)
Microalb/Creat Ratio: <30 mg/g (ref <30)

## 2020-04-30 NOTE — Progress Notes (Signed)
BP 123/80 (BP Location: Left Arm, Patient Position: Sitting, Cuff Size: Normal)   Pulse 60   Temp 98.7 F (37.1 C) (Oral)   Ht 5\' 2"  (1.575 m)   Wt 186 lb 3.2 oz (84.5 kg)   LMP 04/09/2020 (Approximate)   SpO2 100%   BMI 34.06 kg/m    Subjective:    Patient ID: 06/10/2020, female    DOB: 01/15/1976, 44 y.o.   MRN: [id number]  HPI: Belinda [name] is a 44 y.o. female presenting on 04/30/2020 for comprehensive medical examination. Current medical complaints include:none  She currently lives with: husband Menopausal Symptoms: no  Depression Screen done today and results listed below:  Depression screen [facility] 2/9 04/30/2020 04/23/2020 03/15/2018 06/23/2017 02/13/2016  Decreased Interest 0 0 1 1 0  Down, Depressed, Hopeless 0 0 0 1 0  PHQ - 2 Score 0 0 1 2 0  Altered sleeping 1 1 2  0 -  Tired, decreased energy 1 1 2 3  -  Change in appetite 0 1 1 0 -  Feeling bad or failure about yourself  0 1 1 0 -  Trouble concentrating 0 0 2 0 -  Moving slowly or fidgety/restless 0 0 1 0 -  Suicidal thoughts 0 0 0 0 -  PHQ-9 Score 2 4 10 5  -  Difficult doing work/chores Not difficult at all Somewhat difficult Somewhat difficult - -    Past Medical History:  Past Medical History:  Diagnosis Date  . Arthritis    lower back  . Bipolar affective disorder (HCC)   . Controlled substance agreement signed 10/16/2017  . Deviated septum   . Diabetes mellitus without complication (HCC)   . GERD (gastroesophageal reflux disease)   . Headache(784.0)    migraines - none 2 months  . Peptic ulcer   . Pre-diabetes   . Schizophrenia (HCC)   . Sleep apnea   . Substance abuse [facility])     Surgical History:  Past Surgical History:  Procedure Laterality Date  . ESOPHAGOGASTRODUODENOSCOPY  08/05/2012   Procedure: ESOPHAGOGASTRODUODENOSCOPY (EGD);  Surgeon: , MD;  Location: 12/14/2017 ENDOSCOPY;  Service: General;  Laterality: N/A;  . ESOPHAGOGASTRODUODENOSCOPY (EGD) WITH PROPOFOL N/A 08/06/2016     Procedure: ESOPHAGOGASTRODUODENOSCOPY (EGD) WITH PROPOFOL;  Surgeon: 08/07/2012, MD;  Location: [facility];  Service: Endoscopy;  Laterality: N/A;  . ESOPHAGOGASTRODUODENOSCOPY (EGD) WITH PROPOFOL N/A 11/07/2016   Procedure: ESOPHAGOGASTRODUODENOSCOPY (EGD) WITH PROPOFOL;  Surgeon: 13/10/2015, MD;  Location: [facility] ENDOSCOPY;  Service: Endoscopy;  Laterality: N/A;  . 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/2011   Procedure: LAPAROSCOPIC ROUX-EN-Y GASTRIC;  Surgeon: [facility], MD;  Location: [facility];  Service: General;  Laterality: N/A;  upper endoscopy  . SEPTOPLASTY  2003    Medications:  Current Outpatient Medications on File Prior to Visit  Medication Sig  . B-D 3CC LUER-LOK SYR 25GX1" 25G X 1" 3 ML MISC   . cyanocobalamin (,VITAMIN B-12,) 1000 MCG/ML injection INJECT 1 ML (1,000 MCG TOTAL) INTO THE MUSCLE ONCE A WEEK.  . hydrOXYzine (VISTARIL) 100 MG capsule Take 1 capsule (100 mg total) by mouth 3 (three) times daily as needed for anxiety.  . lidocaine (LIDODERM) 5 % Place 1 patch onto the skin daily. Remove & Discard patch within 12 hours or as directed by MD  . ondansetron (ZOFRAN) 4 MG tablet TAKE 1 TABLET (4 MG TOTAL) BY MOUTH EVERY 8 (EIGHT) HOURS AS NEEDED. FOR NAUSEA  . Probiotic Product ([employer] MOOD SUPPORT PROBIOTIC  PO) Take by mouth.   No current facility-administered medications on file prior to visit.    Allergies:  Allergies  Allergen Reactions  . Acetaminophen Nausea Only  . Valproic Acid Other (See Comments)    Tremors  . Depakote [Divalproex Sodium] Other (See Comments)    Tremors    Social History:  Social History   Socioeconomic History  . Marital status: Married    Spouse name: Not on file  . Number of children: Not on file  . Years of education: Not on file  . Highest education level: Not on file  Occupational History  . Occupation: unemployed  Tobacco Use  . Smoking status: Former Smoker    Quit date: 04/03/2017    Years since quitting: 3.0  .  Smokeless tobacco: Never Used  . Tobacco comment: only smoked for about 6months   Vaping Use  . Vaping Use: Never used  Substance and Sexual Activity  . Alcohol use: Yes    Alcohol/week: 3.0 standard drinks    Types: 3 Standard drinks or equivalent per week    Comment: a few times a week  . Drug use: Not Currently    Types: Marijuana  . Sexual activity: Yes    Birth control/protection: None  Other Topics Concern  . Not on file  Social History Narrative  . Not on file   Social Determinants of Health   Financial Resource Strain:   . Difficulty of Paying Living Expenses:   Food Insecurity:   . Worried About Programme researcher, broadcasting/film/videounning Out of Food in the Last Year:   . Baristaan Out of Food in the Last Year:   Transportation Needs:   . Freight forwarderLack of Transportation (Medical):   Marland Kitchen. Lack of Transportation (Non-Medical):   Physical Activity:   . Days of Exercise per Week:   . Minutes of Exercise per Session:   Stress:   . Feeling of Stress :   Social Connections:   . Frequency of Communication with Friends and Family:   . Frequency of Social Gatherings with Friends and Family:   . Attends Religious Services:   . Active Member of Clubs or Organizations:   . Attends BankerClub or Organization Meetings:   Marland Kitchen. Marital Status:   Intimate Partner Violence:   . Fear of Current or Ex-Partner:   . Emotionally Abused:   Marland Kitchen. Physically Abused:   . Sexually Abused:    Social History   Tobacco Use  Smoking Status Former Smoker  . Quit date: 04/03/2017  . Years since quitting: 3.0  Smokeless Tobacco Never Used  Tobacco Comment   only smoked for about 6months    Social History   Substance and Sexual Activity  Alcohol Use Yes  . Alcohol/week: 3.0 standard drinks  . Types: 3 Standard drinks or equivalent per week   Comment: a few times a week    Family History:  Family History  Problem Relation Age of Onset  . Diabetes Mother   . Cancer Mother [id number]       brain, skin  . Cancer Father 660       kidney, bone, rectal    . Hyperlipidemia Father   . Hypertension Father   . Stroke Father   . Cancer Paternal Aunt        breast  . Diabetes Brother   . Allergies Sister     Past medical history, surgical history, medications, allergies, family history and social history reviewed with patient today and changes made to appropriate areas of  the chart.   Review of Systems  Constitutional: Negative.   HENT: Negative.   Eyes: Negative.   Respiratory: Negative.   Cardiovascular: Negative.   Gastrointestinal: Positive for nausea. Negative for abdominal pain, blood in stool, constipation, diarrhea, heartburn, melena and vomiting.  Genitourinary: Negative.   Musculoskeletal: Negative.   Skin: Negative.   Neurological: Negative.   Endo/Heme/Allergies: Negative.   Psychiatric/Behavioral: Negative for depression, hallucinations, memory loss, substance abuse and suicidal ideas. The patient is nervous/anxious. The patient does not have insomnia.     All other ROS negative except what is listed above and in the HPI.      Objective:    BP 123/80 (BP Location: Left Arm, Patient Position: Sitting, Cuff Size: Normal)   Pulse 60   Temp 98.7 F (37.1 C) (Oral)   Ht  (1.575 m)   Wt 186 lb 3.2 oz (84.5 kg)   LMP 04/09/2020 (Approximate)   SpO2 100%   BMI 34.06 kg/m   Wt Readings from Last 3 Encounters:  04/30/20 186 lb 3.2 oz (84.5 kg)  04/23/20 183 lb (83 kg)  11/14/19 188 lb (85.3 kg)    Physical Exam Vitals and nursing note reviewed.  Constitutional:      General: She is not in acute distress.    Appearance: Normal appearance. She is not ill-appearing, toxic-appearing or diaphoretic.  HENT:     Head: Normocephalic and atraumatic.     Right Ear: Tympanic membrane, ear canal and external ear normal. There is no impacted cerumen.     Left Ear: Tympanic membrane, ear canal and external ear normal. There is no impacted cerumen.     Nose: Nose normal. No congestion or rhinorrhea.     Mouth/Throat:      Mouth: Mucous membranes are moist.     Pharynx: Oropharynx is clear. No oropharyngeal exudate or posterior oropharyngeal erythema.  Eyes:     General: No scleral icterus.       Right eye: No discharge.        Left eye: No discharge.     Extraocular Movements: Extraocular movements intact.     Conjunctiva/sclera: Conjunctivae normal.     Pupils: Pupils are equal, round, and reactive to light.  Neck:     Vascular: No carotid bruit.  Cardiovascular:     Rate and Rhythm: Normal rate and regular rhythm.     Pulses: Normal pulses.     Heart sounds: No murmur heard.  No friction rub. No gallop.   Pulmonary:     Effort: Pulmonary effort is normal. No respiratory distress.     Breath sounds: Normal breath sounds. No stridor. No wheezing, rhonchi or rales.  Chest:     Chest wall: No tenderness.  Abdominal:     General: Abdomen is flat. Bowel sounds are normal. There is no distension.     Palpations: Abdomen is soft. There is no mass.     Tenderness: There is no abdominal tenderness. There is no right CVA tenderness, left CVA tenderness, guarding or rebound.     Hernia: No hernia is present.  Genitourinary:    Comments: Breast and pelvic exams deferred with shared decision making Musculoskeletal:        General: No swelling, tenderness, deformity or signs of injury.     Cervical back: Normal range of motion and neck supple. No rigidity. No muscular tenderness.     Right lower leg: No edema.     Left lower leg: No edema.  Lymphadenopathy:  Cervical: No cervical adenopathy.  Skin:    General: Skin is warm and dry.     Capillary Refill: Capillary refill takes less than 2 seconds.     Coloration: Skin is not jaundiced or pale.     Findings: No bruising, erythema, lesion or rash.  Neurological:     General: No focal deficit present.     Mental Status: She is alert and oriented to person, place, and time. Mental status is at baseline.     Cranial Nerves: No cranial nerve deficit.      Sensory: No sensory deficit.     Motor: No weakness.     Coordination: Coordination normal.     Gait: Gait normal.     Deep Tendon Reflexes: Reflexes normal.  Psychiatric:        Mood and Affect: Mood normal.        Behavior: Behavior normal.        Thought Content: Thought content normal.        Judgment: Judgment normal.     Results for orders placed or performed during the hospital encounter of 03/15/20  Fibrin derivatives D-Dimer ([facility] only)  Result Value Ref Range   Fibrin derivatives D-dimer ([facility]) 245.38 0.00 - 499.00 ng/mL (FEU)      Assessment & Plan:   Problem List Items Addressed This Visit      Other   B12 deficiency    Rechecking labs today. Await results. Treat as needed.       Relevant Orders   B12   IDA (iron deficiency anemia)    Rechecking labs today. Await results. Treat as needed.       Relevant Orders   Iron and TIBC   Ferritin    Other Visit Diagnoses    Routine general medical examination at a health care facility    -  Primary   Vaccines up to date. Screening labs checked today. Pap up to date. Mammogram ordered. Continue diet and exericse. Call with any concerns.    Relevant Orders   Comprehensive metabolic panel   CBC with Differential/Platelet   Lipid Panel w/o Chol/HDL Ratio   TSH   History of diabetes mellitus       Labs drawn today. Await results.    Relevant Orders   Microalbumin, Urine Waived   Encounter for screening mammogram for malignant neoplasm of breast       Mammogram ordered today.   Relevant Orders   MM 3D SCREEN BREAST BILATERAL       Follow up plan: Return in about 1 year (around 04/30/2021).   LABORATORY TESTING:  - Pap smear: up to date  IMMUNIZATIONS:   - Tdap: Tetanus vaccination status reviewed: last tetanus booster within 10 years. - Influenza: Postponed to flu season  SCREENING: -Mammogram: Ordered today  - Colonoscopy: Not applicable   PATIENT COUNSELING:   Advised to take 1 mg of folate  supplement per day if capable of pregnancy.   Sexuality: Discussed sexually transmitted diseases, partner selection, use of condoms, avoidance of unintended pregnancy  and contraceptive alternatives.   Advised to avoid cigarette smoking.  I discussed with the patient that most people either abstain from alcohol or drink within safe limits (<=14/week and <=4 drinks/occasion for males, <=7/weeks and <= 3 drinks/occasion for females) and that the risk for alcohol disorders and other health effects rises proportionally with the number of drinks per week and how often a drinker exceeds daily limits.  Discussed cessation/primary prevention of drug  use and availability of treatment for abuse.   Diet: Encouraged to adjust caloric intake to maintain  or achieve ideal body weight, to reduce intake of dietary saturated fat and total fat, to limit sodium intake by avoiding high sodium foods and not adding table salt, and to maintain adequate dietary potassium and calcium preferably from fresh fruits, vegetables, and low-fat dairy products.    stressed the importance of regular exercise  Injury prevention: Discussed safety belts, safety helmets, smoke detector, smoking near bedding or upholstery.   Dental health: Discussed importance of regular tooth brushing, flossing, and dental visits.    NEXT PREVENTATIVE PHYSICAL DUE IN 1 YEAR. Return in about 1 year (around 04/30/2021).

## 2020-04-30 NOTE — Patient Instructions (Addendum)
Call to schedule your mammogram: [phone] [facility] Maintenance, Female Adopting a healthy lifestyle and getting preventive care are important in promoting health and wellness. Ask your health care provider about:  The right schedule for you to have regular tests and exams.  Things you can do on your own to prevent diseases and keep yourself healthy. What should I know about diet, weight, and exercise? Eat a healthy diet   Eat a diet that includes plenty of vegetables, fruits, low-fat dairy products, and lean protein.  Do not eat a lot of foods that are high in solid fats, added sugars, or sodium. Maintain a healthy weight Body mass index (BMI) is used to identify weight problems. It estimates body fat based on height and weight. Your health care provider can help determine your BMI and help you achieve or maintain a healthy weight. Get regular exercise Get regular exercise. This is one of the most important things you can do for your health. Most adults should:  Exercise for at least 150 minutes each week. The exercise should increase your heart rate and make you sweat (moderate-intensity exercise).  Do strengthening exercises at least twice a week. This is in addition to the moderate-intensity exercise.  Spend less time sitting. Even light physical activity can be beneficial. Watch cholesterol and blood lipids Have your blood tested for lipids and cholesterol at 44 years of age, then have this test every 5 years. Have your cholesterol levels checked more often if:  Your lipid or cholesterol levels are high.  You are older than 44 years of age.  You are at high risk for heart disease. What should I know about cancer screening? Depending on your health history and family history, you may need to have cancer screening at various ages. This may include screening for:  Breast cancer.  Cervical cancer.  Colorectal cancer.  Skin cancer.  Lung cancer. What  should I know about heart disease, diabetes, and high blood pressure? Blood pressure and heart disease  High blood pressure causes heart disease and increases the risk of stroke. This is more likely to develop in people who have high blood pressure readings, are of African descent, or are overweight.  Have your blood pressure checked: ? Every 3-5 years if you are 76-51 years of age. ? Every year if you are 102 years old or older. Diabetes Have regular diabetes screenings. This checks your fasting blood sugar level. Have the screening done:  Once every three years after age 89 if you are at a normal weight and have a low risk for diabetes.  More often and at a younger age if you are overweight or have a high risk for diabetes. What should I know about preventing infection? Hepatitis B If you have a higher risk for hepatitis B, you should be screened for this virus. Talk with your health care provider to find out if you are at risk for hepatitis B infection. Hepatitis C Testing is recommended for:  Everyone born from [birth year] through 1965.  Anyone with known risk factors for hepatitis C. Sexually transmitted infections (STIs)  Get screened for STIs, including gonorrhea and chlamydia, if: ? You are sexually active and are younger than 44 years of age. ? You are older than 44 years of age and your health care provider tells you that you are at risk for this type of infection. ? Your sexual activity has changed since you were last screened, and you are at increased risk for  chlamydia or gonorrhea. Ask your health care provider if you are at risk.  Ask your health care provider about whether you are at high risk for HIV. Your health care provider may recommend a prescription medicine to help prevent HIV infection. If you choose to take medicine to prevent HIV, you should first get tested for HIV. You should then be tested every 3 months for as long as you are taking the medicine. Pregnancy  If  you are about to stop having your period (premenopausal) and you may become pregnant, seek counseling before you get pregnant.  Take 400 to 800 micrograms (mcg) of folic acid every day if you become pregnant.  Ask for birth control (contraception) if you want to prevent pregnancy. Osteoporosis and menopause Osteoporosis is a disease in which the bones lose minerals and strength with aging. This can result in bone fractures. If you are 39 years old or older, or if you are at risk for osteoporosis and fractures, ask your health care provider if you should:  Be screened for bone loss.  Take a calcium or vitamin D supplement to lower your risk of fractures.  Be given hormone replacement therapy (HRT) to treat symptoms of menopause. Follow these instructions at home: Lifestyle  Do not use any products that contain nicotine or tobacco, such as cigarettes, e-cigarettes, and chewing tobacco. If you need help quitting, ask your health care provider.  Do not use street drugs.  Do not share needles.  Ask your health care provider for help if you need support or information about quitting drugs. Alcohol use  Do not drink alcohol if: ? Your health care provider tells you not to drink. ? You are pregnant, may be pregnant, or are planning to become pregnant.  If you drink alcohol: ? Limit how much you use to 0-1 drink a day. ? Limit intake if you are breastfeeding.  Be aware of how much alcohol is in your drink. In the [place] one drink equals one 12 oz bottle of beer (355 mL), one 5 oz glass of wine (148 mL), or one 1 oz glass of hard liquor (44 mL). General instructions  Schedule regular health, dental, and eye exams.  Stay current with your vaccines.  Tell your health care provider if: ? You often feel depressed. ? You have ever been abused or do not feel safe at home. Summary  Adopting a healthy lifestyle and getting preventive care are important in promoting health and  wellness.  Follow your health care provider's instructions about healthy diet, exercising, and getting tested or screened for diseases.  Follow your health care provider's instructions on monitoring your cholesterol and blood pressure. This information is not intended to replace advice given to you by your health care provider. Make sure you discuss any questions you have with your health care provider. Document Revised: 09/15/2018 Document Reviewed: 09/15/2018 Elsevier Patient Education  2020 [employer].

## 2020-04-30 NOTE — Assessment & Plan Note (Signed)
Rechecking labs today. Await results. Treat as needed.  °

## 2020-05-01 LAB — CBC WITH DIFFERENTIAL/PLATELET
Basophils Absolute: 0.1 10*3/uL (ref 0.0–0.2)
Basos: 1 %
EOS (ABSOLUTE): 0.1 10*3/uL (ref 0.0–0.4)
Eos: 2 %
Hematocrit: 41.2 % (ref 34.0–46.6)
Hemoglobin: 13.7 g/dL (ref 11.1–15.9)
Immature Grans (Abs): 0 10*3/uL (ref 0.0–0.1)
Immature Granulocytes: 0 %
Lymphocytes Absolute: 2.9 10*3/uL (ref 0.7–3.1)
Lymphs: 34 %
MCH: 32.1 pg (ref 26.6–33.0)
MCHC: 33.3 g/dL (ref 31.5–35.7)
MCV: 97 fL (ref 79–97)
Monocytes Absolute: 0.4 10*3/uL (ref 0.1–0.9)
Monocytes: 5 %
Neutrophils Absolute: 4.9 10*3/uL (ref 1.4–7.0)
Neutrophils: 58 %
Platelets: 222 10*3/uL (ref 150–450)
RBC: 4.27 x10E6/uL (ref 3.77–5.28)
RDW: 12.4 % (ref 11.7–15.4)
WBC: 8.4 10*3/uL (ref 3.4–10.8)

## 2020-05-01 LAB — LIPID PANEL W/O CHOL/HDL RATIO
Cholesterol, Total: 173 mg/dL (ref 100–199)
HDL: 72 mg/dL (ref >39)
LDL Chol Calc (NIH): 86 mg/dL (ref 0–99)
Triglycerides: 83 mg/dL (ref 0–149)
VLDL Cholesterol Cal: 15 mg/dL (ref 5–40)

## 2020-05-01 LAB — COMPREHENSIVE METABOLIC PANEL
ALT: 17 IU/L (ref 0–32)
AST: 18 IU/L (ref 0–40)
Albumin/Globulin Ratio: 2 (ref 1.2–2.2)
Albumin: 4.3 g/dL (ref 3.8–4.8)
Alkaline Phosphatase: 63 IU/L (ref 48–121)
BUN/Creatinine Ratio: 13 (ref 9–23)
BUN: 10 mg/dL (ref 6–24)
Bilirubin Total: 0.4 mg/dL (ref 0.0–1.2)
CO2: 23 mmol/L (ref 20–29)
Calcium: 9.3 mg/dL (ref 8.7–10.2)
Chloride: 102 mmol/L (ref 96–106)
Creatinine, Ser: 0.75 mg/dL (ref 0.57–1.00)
GFR calc Af Amer: 112 mL/min/{1.73_m2} (ref >59)
GFR calc non Af Amer: 97 mL/min/{1.73_m2} (ref >59)
Globulin, Total: 2.1 g/dL (ref 1.5–4.5)
Glucose: 92 mg/dL (ref 65–99)
Potassium: 4.6 mmol/L (ref 3.5–5.2)
Sodium: 137 mmol/L (ref 134–144)
Total Protein: 6.4 g/dL (ref 6.0–8.5)

## 2020-05-01 LAB — FERRITIN: Ferritin: 30 ng/mL (ref 15–150)

## 2020-05-01 LAB — VITAMIN B12: Vitamin B-12: 513 pg/mL (ref 232–1245)

## 2020-05-01 LAB — IRON AND TIBC
Iron Saturation: 45 % (ref 15–55)
Iron: 161 ug/dL — ABNORMAL HIGH (ref 27–159)
Total Iron Binding Capacity: 358 ug/dL (ref 250–450)
UIBC: 197 ug/dL (ref 131–425)

## 2020-05-01 LAB — TSH: TSH: 1.98 u[IU]/mL (ref 0.450–4.500)

## 2020-05-09 ENCOUNTER — Telehealth: Payer: Self-pay | Admitting: Oncology

## 2020-05-09 NOTE — Telephone Encounter (Signed)
Pt would like to cancel her upcoming August appts and reschedule them for 3 months out. She stated Dr. Laural Benes at [facility] completed her labs last week and her labs looked great. Please advise on reschedule.

## 2020-05-09 NOTE — Telephone Encounter (Signed)
Done..  Appts were moved out to 11/12/2020

## 2020-05-09 NOTE — Telephone Encounter (Signed)
Lab results from PCP are in Epic. Dr. Cathie Hoops,  please advise if ok to reschedule appts 3 months out.

## 2020-05-09 NOTE — Telephone Encounter (Signed)
Please move her appt out for 6 months. Thanks.

## 2020-05-09 NOTE — Telephone Encounter (Signed)
Please notify pt that appts have been moved out 6 months, not 3. Thanks

## 2020-05-09 NOTE — Telephone Encounter (Signed)
Pt is aware.  

## 2020-05-14 ENCOUNTER — Inpatient Hospital Stay: Payer: Self-pay | Admitting: Oncology

## 2020-05-14 ENCOUNTER — Inpatient Hospital Stay: Payer: Self-pay

## 2020-05-15 ENCOUNTER — Inpatient Hospital Stay: Payer: Self-pay

## 2020-05-22 ENCOUNTER — Encounter: Payer: Self-pay | Admitting: Family Medicine

## 2020-06-01 ENCOUNTER — Telehealth (INDEPENDENT_AMBULATORY_CARE_PROVIDER_SITE_OTHER): Payer: Self-pay | Admitting: Family Medicine

## 2020-06-01 ENCOUNTER — Encounter: Payer: Self-pay | Admitting: Family Medicine

## 2020-06-01 VITALS — Wt 184.0 lb

## 2020-06-01 DIAGNOSIS — F319 Bipolar disorder, unspecified: Secondary | ICD-10-CM

## 2020-06-01 DIAGNOSIS — T4272XD Poisoning by unspecified antiepileptic and sedative-hypnotic drugs, intentional self-harm, subsequent encounter: Secondary | ICD-10-CM

## 2020-06-01 NOTE — Progress Notes (Signed)
Wt 184 lb (83.5 kg)   BMI 33.65 kg/m    Subjective:    Patient ID: Belinda [name], female    DOB: 1976-09-29, 44 y.o.   MRN: [id number]  HPI: Belinda [name] is a 44 y.o. female  Chief Complaint  Patient presents with  . Anxiety    pt states she wants to discuss the hydroxizine, feels like it is not helping as much as she would like and made her feel bad the next day    Took her hydroxyzine, and the next day, she felt weird. She notes that about 2 hours later, her chest was still hurting. She notes that she was feeling pretty groggy the next day. She would like to go back on something else when she has her panic attacks. They happen rarely- about 1x a month. She does not want to take any long-term daily medicine as she doesn't feel like she needs it. She had a sedative overdose on klonopin in th past, so we have discussed that she will need to see psychiatry for discussion of any controlled substances.   Relevant past medical, surgical, family and social history reviewed and updated as indicated. Interim medical history since our last visit reviewed. Allergies and medications reviewed and updated.  Review of Systems  Constitutional: Negative.   Respiratory: Negative.   Cardiovascular: Negative.   Gastrointestinal: Negative.   Neurological: Negative.   Psychiatric/Behavioral: Negative.     Per HPI unless specifically indicated above     Objective:    Wt 184 lb (83.5 kg)   BMI 33.65 kg/m   Wt Readings from Last 3 Encounters:  06/01/20 184 lb (83.5 kg)  04/30/20 186 lb 3.2 oz (84.5 kg)  04/23/20 183 lb (83 kg)    Physical Exam Vitals and nursing note reviewed.  Constitutional:      General: She is not in acute distress.    Appearance: Normal appearance. She is not ill-appearing, toxic-appearing or diaphoretic.  HENT:     Head: Normocephalic and atraumatic.     Right Ear: External ear normal.     Left Ear: External ear normal.     Nose: Nose normal.      Mouth/Throat:     Mouth: Mucous membranes are moist.     Pharynx: Oropharynx is clear.  Eyes:     General: No scleral icterus.       Right eye: No discharge.        Left eye: No discharge.     Conjunctiva/sclera: Conjunctivae normal.     Pupils: Pupils are equal, round, and reactive to light.  Pulmonary:     Effort: Pulmonary effort is normal. No respiratory distress.     Comments: Speaking in full sentences Musculoskeletal:        General: Normal range of motion.     Cervical back: Normal range of motion.  Skin:    Coloration: Skin is not jaundiced or pale.     Findings: No bruising, erythema, lesion or rash.  Neurological:     Mental Status: She is alert and oriented to person, place, and time. Mental status is at baseline.  Psychiatric:        Mood and Affect: Mood normal.        Behavior: Behavior normal.        Thought Content: Thought content normal.        Judgment: Judgment normal.     Results for orders placed or performed in visit on 04/30/20  Comprehensive metabolic panel  Result Value Ref Range   Glucose 92 65 - 99 mg/dL   BUN 10 6 - 24 mg/dL   Creatinine, Ser 4.09 0.57 - 1.00 mg/dL   GFR calc non Af Amer 97 >59 mL/min/1.73   GFR calc Af Amer 112 >59 mL/min/1.73   BUN/Creatinine Ratio 13 9 - 23   Sodium 137 134 - 144 mmol/L   Potassium 4.6 3.5 - 5.2 mmol/L   Chloride 102 96 - 106 mmol/L   CO2 23 20 - 29 mmol/L   Calcium 9.3 8.7 - 10.2 mg/dL   Total Protein 6.4 6.0 - 8.5 g/dL   Albumin 4.3 3.8 - 4.8 g/dL   Globulin, Total 2.1 1.5 - 4.5 g/dL   Albumin/Globulin Ratio 2.0 1.2 - 2.2   Bilirubin Total 0.4 0.0 - 1.2 mg/dL   Alkaline Phosphatase 63 48 - 121 IU/L   AST 18 0 - 40 IU/L   ALT 17 0 - 32 IU/L  CBC with Differential/Platelet  Result Value Ref Range   WBC 8.4 3.4 - 10.8 x10E3/uL   RBC 4.27 3.77 - 5.28 x10E6/uL   Hemoglobin 13.7 11.1 - 15.9 g/dL   Hematocrit 81.1 91.4 - 46.6 %   MCV 97 79 - 97 fL   MCH 32.1 26.6 - 33.0 pg   MCHC 33.3 31 - 35 g/dL     RDW 78.2 95.6 - 21.3 %   Platelets 222 150 - 450 x10E3/uL   Neutrophils 58 Not Estab. %   Lymphs 34 Not Estab. %   Monocytes 5 Not Estab. %   Eos 2 Not Estab. %   Basos 1 Not Estab. %   Neutrophils Absolute 4.9 1 - 7 x10E3/uL   Lymphocytes Absolute 2.9 0 - 3 x10E3/uL   Monocytes Absolute 0.4 0 - 0 x10E3/uL   EOS (ABSOLUTE) 0.1 0.0 - 0.4 x10E3/uL   Basophils Absolute 0.1 0 - 0 x10E3/uL   Immature Granulocytes 0 Not Estab. %   Immature Grans (Abs) 0.0 0.0 - 0.1 x10E3/uL  Lipid Panel w/o Chol/HDL Ratio  Result Value Ref Range   Cholesterol, Total 173 100 - 199 mg/dL   Triglycerides 83 0 - 149 mg/dL   HDL 72 >08 mg/dL   VLDL Cholesterol Cal 15 5 - 40 mg/dL   LDL Chol Calc (NIH) 86 0 - 99 mg/dL  Microalbumin, Urine Waived  Result Value Ref Range   Microalb, Ur Waived 10 0 - 19 mg/L   Creatinine, Urine Waived 10 10 - 300 mg/dL   Microalb/Creat Ratio <30 <30 mg/g  TSH  Result Value Ref Range   TSH 1.980 0.450 - 4.500 uIU/mL  Iron and TIBC  Result Value Ref Range   Total Iron Binding Capacity 358 250 - 450 ug/dL   UIBC 657 846 - 962 ug/dL   Iron 952 (H) 27 - 841 ug/dL   Iron Saturation 45 15 - 55 %  Ferritin  Result Value Ref Range   Ferritin 30 15.0 - 150.0 ng/mL  B12  Result Value Ref Range   Vitamin B-12 513 232 - 1,245 pg/mL      Assessment & Plan:   Problem List Items Addressed This Visit      Other   Bipolar 1 disorder, depressed (HCC) - Primary    Not interested in medicine besides for panic attacks at this time. Did not tolerate hydroxyzine. We discus that because of her previous overdose, I'm not comfortable prescribing her controlled substances. She would like to  see a different psychiatrist. Referral made today.      Relevant Orders   Ambulatory referral to Psychiatry   Sedative overdose   Relevant Orders   Ambulatory referral to Psychiatry       Follow up plan: Return if symptoms worsen or fail to improve.   . This visit was completed via MyChart  due to the restrictions of the COVID-19 pandemic. All issues as above were discussed and addressed. Physical exam was done as above through visual confirmation on MyChart. If it was felt that the patient should be evaluated in the office, they were directed there. The patient verbally consented to this visit. . Location of the patient: parking lot . Location of the provider: work . Those involved with this call:  . Provider: Olevia Perches, DO . CMA: Floydene Flock, RMA . Front Desk/Registration: Adela Ports  . Time spent on call: 15 minutes with patient face to face via video conference. More than 50% of this time was spent in counseling and coordination of care. 23 minutes total spent in review of patient's record and preparation of their chart.

## 2020-06-01 NOTE — Assessment & Plan Note (Signed)
Not interested in medicine besides for panic attacks at this time. Did not tolerate hydroxyzine. We discus that because of her previous overdose, I'm not comfortable prescribing her controlled substances. She would like to see a different psychiatrist. Referral made today.

## 2020-06-08 ENCOUNTER — Telehealth: Payer: Self-pay | Admitting: Family Medicine

## 2020-06-08 DIAGNOSIS — Z20822 Contact with and (suspected) exposure to covid-19: Secondary | ICD-10-CM

## 2020-06-08 NOTE — Telephone Encounter (Signed)
PT notified via phone call. My Chart Message sent regarding Scheduling info.

## 2020-06-08 NOTE — Telephone Encounter (Signed)
Order in. Please let her know the following. If she needs appt or is feeling sick, we can see her virtually. To schedule a COVID test, please  text COVID to [phone], OR you can log on to [email or web address] to easily make an on-line appointment. If you do not have access to a smart phone or PC, you can call [phone] to get assistance.

## 2020-06-08 NOTE — Telephone Encounter (Signed)
Copied from CRM [id number]. Topic: General - Inquiry >> Jun 08, 2020 11:25 AM Adrian Prince D wrote: Reason for CRM: Patient's husband tested positive for covid and they are both quarantined however he job request that she get a test to be paid. She would like for you to order her a test. Please advise

## 2020-06-12 ENCOUNTER — Encounter: Payer: Self-pay | Admitting: Nurse Practitioner

## 2020-06-12 ENCOUNTER — Ambulatory Visit: Payer: Self-pay

## 2020-06-12 ENCOUNTER — Telehealth (INDEPENDENT_AMBULATORY_CARE_PROVIDER_SITE_OTHER): Payer: Self-pay | Admitting: Nurse Practitioner

## 2020-06-12 VITALS — Temp 99.6°F

## 2020-06-12 DIAGNOSIS — G43911 Migraine, unspecified, intractable, with status migrainosus: Secondary | ICD-10-CM

## 2020-06-12 DIAGNOSIS — J069 Acute upper respiratory infection, unspecified: Secondary | ICD-10-CM | POA: Insufficient documentation

## 2020-06-12 DIAGNOSIS — Z20822 Contact with and (suspected) exposure to covid-19: Secondary | ICD-10-CM

## 2020-06-12 MED ORDER — ALBUTEROL SULFATE HFA 108 (90 BASE) MCG/ACT IN AERS: 2.0000 | INHALATION_SPRAY | Freq: Four times a day (QID) | RESPIRATORY_TRACT | 0 refills | Status: DC | PRN

## 2020-06-12 MED ORDER — GUAIFENESIN ER 600 MG PO TB12: 600.0000 mg | ORAL_TABLET | Freq: Two times a day (BID) | ORAL | 0 refills | Status: DC | PRN

## 2020-06-12 MED ORDER — NORTRIPTYLINE HCL 10 MG PO CAPS: 10.0000 mg | ORAL_CAPSULE | Freq: Every day | ORAL | 0 refills | Status: DC

## 2020-06-12 NOTE — Telephone Encounter (Signed)
Needs appt

## 2020-06-12 NOTE — Assessment & Plan Note (Addendum)
Acute, ongoing.  Viral likely etiology, cannot rule out COVID-19 at this time, especially since husband tested positive last week.  Patient to make [place] aware if test comes back positive tomorrow, qualifies for monoclonal antibodies (BMI > 25) and is interested in receiving if able.  In meantime, continue to manage symptoms with over the counter medications.  Start expectorant and increase hydration with water.  Also start nortriptyline for migraine, discussed that this is common symptom that can be intractable.  Rescue inhaler given in case of shortness of breath.  Patient to return to clinic if symptoms worsen or do not improve.  With any sudden onset of chest pain or shortness of breath, go to ER emergently.

## 2020-06-12 NOTE — Patient Instructions (Signed)
10 Things You Can Do to Manage Your COVID-19 Symptoms at Home If you have possible or confirmed COVID-19: 1. Stay home from work and school. And stay away from other public places. If you must go out, avoid using any kind of public transportation, ridesharing, or taxis. 2. Monitor your symptoms carefully. If your symptoms get worse, call your healthcare provider immediately. 3. Get rest and stay hydrated. 4. If you have a medical appointment, call the healthcare provider ahead of time and tell them that you have or may have COVID-19. 5. For medical emergencies, call 911 and notify the dispatch personnel that you have or may have COVID-19. 6. Cover your cough and sneezes with a tissue or use the inside of your elbow. 7. Wash your hands often with soap and water for at least 20 seconds or clean your hands with an alcohol-based hand sanitizer that contains at least 60% alcohol. 8. As much as possible, stay in a specific room and away from other people in your home. Also, you should use a separate bathroom, if available. If you need to be around other people in or outside of the home, wear a mask. 9. Avoid sharing personal items with other people in your household, like dishes, towels, and bedding. 10. Clean all surfaces that are touched often, like counters, tabletops, and doorknobs. Use household cleaning sprays or wipes according to the label instructions. cdc.gov/coronavirus 04/06/2019 This information is not intended to replace advice given to you by your health care provider. Make sure you discuss any questions you have with your health care provider. Document Revised: 09/08/2019 Document Reviewed: 09/08/2019 Elsevier Patient Education  2020 Elsevier Inc.  

## 2020-06-12 NOTE — Progress Notes (Signed)
Temp 99.6 F (37.6 C) (Oral)    Subjective:    Patient ID: Belinda [name], female    DOB: 1976/02/03, 44 y.o.   MRN: [id number]  HPI: Belinda [name] is a 44 y.o. female presenting virtually with upper respiratory tract infection symptoms.  Chief Complaint  Patient presents with   Covid Exposure    pt states she was exposed about a week ago, around 05/04/20. States she has had a fever, cough, and loss of taste today    UPPER RESPIRATORY TRACT INFECTION Reports husband tested positive for COVID-19 9/1;  Onset: yesterday Worst symptom: headache Fever: yes Cough: yes - dry but feels productive Shortness of breath: no Wheezing: no Chest pain: no Chest tightness: yes - yesterday Chest congestion: yes Nasal congestion: no Runny nose: yes Post nasal drip: no Sneezing: yes - takes zyrtec Sore throat: no Swollen glands: no Sinus pressure: yes - frontal Headache: yes Face pain: no Toothache: no Ear pain: no right Ear pressure: yes "right Eyes red/itching:no Eye drainage/crusting: no  Nausea: no Vomiting: no Rash: no Fatigue: yes Sick contacts: yes; husband tested positive for COVID last week Strep contacts: no  Context: fluctuating Recurrent sinusitis: no Relief with OTC cold/cough medications: yes  Treatments attempted: Motrin, zyrtec, aspirin,    Allergies  Allergen Reactions   Acetaminophen Nausea Only   Valproic Acid Other (See Comments)    Tremors   Depakote [Divalproex Sodium] Other (See Comments)    Tremors   Outpatient Encounter Medications as of 06/12/2020  Medication Sig   B-D 3CC LUER-LOK SYR 25GX1" 25G X 1" 3 ML MISC    cyanocobalamin (,VITAMIN B-12,) 1000 MCG/ML injection INJECT 1 ML (1,000 MCG TOTAL) INTO THE MUSCLE ONCE A WEEK.   lidocaine (LIDODERM) 5 % Place 1 patch onto the skin daily. Remove & Discard patch within 12 hours or as directed by MD   ondansetron (ZOFRAN) 4 MG tablet TAKE 1 TABLET (4 MG TOTAL) BY MOUTH EVERY 8 (EIGHT) HOURS  AS NEEDED. FOR NAUSEA   Probiotic Product ([employer] MOOD SUPPORT PROBIOTIC PO) Take by mouth.   albuterol (VENTOLIN HFA) 108 (90 Base) MCG/ACT inhaler Inhale 2 puffs into the lungs every 6 (six) hours as needed for wheezing or shortness of breath.   guaiFENesin (MUCINEX) 600 MG 12 hr tablet Take 1 tablet (600 mg total) by mouth 2 (two) times daily as needed for cough or to loosen phlegm.   hydrOXYzine (VISTARIL) 100 MG capsule Take 1 capsule (100 mg total) by mouth 3 (three) times daily as needed for anxiety. (Patient not taking: Reported on 06/12/2020)   nortriptyline (PAMELOR) 10 MG capsule Take 1 capsule (10 mg total) by mouth at bedtime.   No facility-administered encounter medications on file as of 06/12/2020.   Patient Active Problem List   Diagnosis Date Noted   Upper respiratory tract infection 06/12/2020   IDA (iron deficiency anemia) 04/04/2019   Bipolar 1 disorder, depressed (HCC) 03/22/2018   Sedative overdose 03/22/2018   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/04/2017   Nausea    Arthropathy of lumbar facet joint 05/28/2016   B12 deficiency 02/13/2016   Bariatric surgery status 02/23/2014   Morbid obesity (HCC) 10/14/2011   Past Medical History:  Diagnosis Date   Arthritis    lower back   Bipolar affective disorder (HCC)    Controlled substance agreement signed 10/16/2017   Deviated septum    Diabetes mellitus without complication (HCC)    GERD (gastroesophageal reflux disease)    Headache(784.0)  migraines - none 2 months   Peptic ulcer    Pre-diabetes    Schizophrenia (HCC)    Sleep apnea    Substance abuse (HCC)    Relevant past medical, surgical, family and social history reviewed and updated as indicated. Interim medical history since our last visit reviewed.  Review of Systems  Constitutional: Positive for activity change, appetite change, fatigue and fever.  HENT: Positive for congestion, ear pain, rhinorrhea, sinus pain and sneezing. Negative  for ear discharge, hearing loss, postnasal drip, sinus pressure and sore throat.   Eyes: Negative.  Negative for pain, discharge, redness, itching and visual disturbance.  Respiratory: Positive for cough. Negative for chest tightness, shortness of breath and wheezing.   Cardiovascular: Negative.  Negative for chest pain.  Gastrointestinal: Negative.  Negative for nausea and vomiting.  Endocrine: Negative.   Skin: Negative.  Negative for rash.  Neurological: Positive for headaches. Negative for dizziness and weakness.  Hematological: Negative.  Negative for adenopathy.  Psychiatric/Behavioral: Negative.  Negative for agitation, confusion, decreased concentration and sleep disturbance. The patient is not nervous/anxious.     Per HPI unless specifically indicated above     Objective:    Temp 99.6 F (37.6 C) (Oral)   Wt Readings from Last 3 Encounters:  06/01/20 184 lb (83.5 kg)  04/30/20 186 lb 3.2 oz (84.5 kg)  04/23/20 183 lb (83 kg)    Physical Exam Vitals and nursing note reviewed.  Constitutional:      General: She is not in acute distress.    Appearance: Normal appearance. She is not ill-appearing or toxic-appearing.  HENT:     Head: Normocephalic and atraumatic.     Right Ear: External ear normal.     Left Ear: External ear normal.     Nose: Nose normal. No congestion or rhinorrhea.     Mouth/Throat:     Mouth: Mucous membranes are moist.     Pharynx: Oropharynx is clear.  Eyes:     General: No scleral icterus.       Right eye: No discharge.        Left eye: No discharge.     Extraocular Movements: Extraocular movements intact.  Cardiovascular:     Comments: Unable to assess heart sounds via virtual visit Pulmonary:     Effort: Pulmonary effort is normal. No respiratory distress.     Comments: Unable to assess lung sounds via virtual visit - talking in complete sentences Abdominal:     Comments: Unable to assess bowel sounds via virtual visit  Skin:     Coloration: Skin is not jaundiced or pale.     Findings: No erythema.  Neurological:     General: No focal deficit present.     Mental Status: She is alert and oriented to person, place, and time.  Psychiatric:        Mood and Affect: Mood normal.        Behavior: Behavior normal.        Thought Content: Thought content normal.        Judgment: Judgment normal.     Assessment & Plan:   Problem List Items Addressed This Visit      Respiratory   Upper respiratory tract infection    Acute, ongoing.  Viral likely etiology, cannot rule out COVID-19 at this time, especially since husband tested positive last week.  Patient to make [place] aware if test comes back positive tomorrow, qualifies for monoclonal antibodies (BMI > 25) and is interested  in receiving if able.  In meantime, continue to manage symptoms with over the counter medications.  Start expectorant and increase hydration with water.  Also start nortriptyline for migraine, discussed that this is common symptom that can be intractable.  Rescue inhaler given in case of shortness of breath.  Patient to return to clinic if symptoms worsen or do not improve.  With any sudden onset of chest pain or shortness of breath, go to ER emergently.       Other Visit Diagnoses    Suspected COVID-19 virus infection    -  Primary   Intractable migraine with status migrainosus, unspecified migraine type       Relevant Medications   nortriptyline (PAMELOR) 10 MG capsule       Follow up plan: Return if symptoms worsen or fail to improve.  Due to the catastrophic nature of the COVID-19 pandemic, this visit was completed via audio and visual contact via Mychart due to the restrictions of the COVID-19 pandemic. All issues as above were discussed and addressed. Physical exam was done as above through visual confirmation on Mychart. If it was felt that the patient should be evaluated in the office, they were directed there. The patient verbally consented to  this visit."}  Location of the patient: home  Location of the provider: work  Those involved with this call:   Provider: Mardene Celeste, DNP  CMA: Wilhemena Durie, CMA  Front Desk/Registration: Adela Ports   Time spent on call: 15 minutes with patient face to face via video conference. More than 50% of this time was spent in counseling and coordination of care. 30 minutes total spent in review of patient's record and preparation of their chart.  I verified patient identity using two factors (patient name and date of birth). Patient consents verbally to being seen via telemedicine visit today.

## 2020-06-12 NOTE — Telephone Encounter (Signed)
Pt.'s husband diagnosed with COVID 19 last week. Yesterday started having fever, headache, body aches. Taking Motrin for her headache "but it won't go away." States she has a history of migraines. No virtual visits available today. Has appointment for testing tomorrow. Asking if something for headache can be sent to her pharmacy. Please advise.  Answer Assessment - Initial Assessment Questions 1. COVID-19 CLOSE CONTACT: "Who is the person with the confirmed or suspected COVID-19 infection that you were exposed to?"     Husband 2. PLACE of CONTACT: "Where were you when you were exposed to COVID-19?" (e.g., home, school, medical waiting room; which city?)     Husband 3. TYPE of CONTACT: "How much contact was there?" (e.g., sitting next to, live in same house, work in same office, same building)     Lives in home 4. DURATION of CONTACT: "How long were you in contact with the COVID-19 patient?" (e.g., a few seconds, passed by person, a few minutes, 15 minutes or longer, live with the patient)     Last week 5. MASK: "Were you wearing a mask?" "Was the other person wearing a mask?" Note: wearing a mask reduces the risk of an otherwise close contact.     No 6. DATE of CONTACT: "When did you have contact with a COVID-19 patient?" (e.g., how many days ago)     Last week 7. COMMUNITY SPREAD: "Are there lots of cases of COVID-19 (community spread) where you live?" (See public health department website, if unsure)       Yes 8. SYMPTOMS: "Do you have any symptoms?" (e.g., fever, cough, breathing difficulty, loss of taste or smell)     Headache, fever,body aches 9. PREGNANCY OR POSTPARTUM: "Is there any chance you are pregnant?" "When was your last menstrual period?" "Did you deliver in the last 2 weeks?"     No 10. HIGH RISK: "Do you have any heart or lung problems?" "Do you have a weak immune system?" (e.g., heart failure, COPD, asthma, HIV positive, chemotherapy, renal failure, diabetes mellitus, sickle  cell anemia, obesity)       Diabetic 11. TRAVEL: "Have you traveled out of the country recently?" If Yes, ask: "When and where?" Also ask about out-of-state travel, since the CDC has identified some high-risk cities for community spread in the [place]. Note: Travel becomes less relevant if there is widespread community transmission where the patient lives.       No  Protocols used: CORONAVIRUS (COVID-19) EXPOSURE-A-AH

## 2020-06-13 ENCOUNTER — Other Ambulatory Visit: Payer: Self-pay

## 2020-06-13 DIAGNOSIS — Z20822 Contact with and (suspected) exposure to covid-19: Secondary | ICD-10-CM

## 2020-06-17 LAB — NOVEL CORONAVIRUS, NAA: SARS-CoV-2, NAA: DETECTED — AB

## 2020-06-18 ENCOUNTER — Other Ambulatory Visit: Payer: Self-pay | Admitting: Nurse Practitioner

## 2020-06-18 ENCOUNTER — Telehealth: Payer: Self-pay | Admitting: Family Medicine

## 2020-06-18 ENCOUNTER — Telehealth: Payer: Self-pay | Admitting: Physician Assistant

## 2020-06-18 DIAGNOSIS — U071 COVID-19: Secondary | ICD-10-CM

## 2020-06-18 NOTE — Telephone Encounter (Signed)
Called to Discuss with patient about Covid symptoms and the use of the monoclonal antibody infusion for those with mild to moderate Covid symptoms and at a high risk of hospitalization.     Pt appears to qualify for this infusion due to co-morbid conditions and/or a member of an at-risk group in accordance with the FDA Emergency Use Authorization.    Unable to reach pt, I left a voicemail with the number to the MAB hotline. I also sent information via MyChart message.   Roe Rutherford Kennard Fildes, PA-C 06/18/2020, 11:53 AM

## 2020-06-18 NOTE — Progress Notes (Signed)
°  I connected by phone with Belinda [name] on 06/18/2020 at 11:58 AM to discuss the potential use of an new treatment for mild to moderate COVID-19 viral infection in non-hospitalized patients.  Symptoms started 06/12/2020, tested + over weekend -- continues to have moderate symptoms. Not vaccinated.   This patient is a 44 y.o. female that meets the FDA criteria for Emergency Use Authorization of bamlanivimab:  Has a (+) direct SARS-CoV-2 viral test result  Has mild or moderate COVID-19   Is ? 44 years of age and weighs ? 40 kg  Is NOT hospitalized due to COVID-19  Is NOT requiring oxygen therapy or requiring an increase in baseline oxygen flow rate due to COVID-19  Is within 10 days of symptom onset  Has at least one of the high risk factor(s) for progression to severe COVID-19 and/or hospitalization as defined in EUA.  Specific high risk criteria : BMI > 25  Reviewed patient chronic problem list, which is currently managed by their PCP.  I have spoken and communicated the following to the patient or parent/caregiver:  1. FDA has authorized the emergency use of bamlanivimab for the treatment of mild to moderate COVID-19 in adults and pediatric patients with positive results of direct SARS-CoV-2 viral testing who are 54 years of age and older weighing at least 40 kg, and who are at high risk for progressing to severe COVID-19 and/or hospitalization.  2. The significant known and potential risks and benefits of bamlanivimab, and the extent to which such potential risks and benefits are unknown.  3. Information on available alternative treatments and the risks and benefits of those alternatives, including clinical trials.  4. Patients treated with bamlanivimab should continue to self-isolate and use infection control measures (e.g., wear mask, isolate, social distance, avoid sharing personal items, clean and disinfect high touch surfaces, and frequent handwashing) according to CDC  guidelines.   5. The patient or parent/caregiver has the option to accept or refuse bamlanivimab.  After reviewing this information with the patient, The patient agreed to proceed with receiving casirivimab\imdevimab infusion and will be provided a copy of the Fact sheet prior to receiving the infusion.  [name] 06/18/2020 11:58 AM

## 2020-06-18 NOTE — Telephone Encounter (Signed)
Copied from CRM [id number]. Topic: Appointment Scheduling - Scheduling Inquiry for Clinic >> Jun 18, 2020  7:50 AM Crist Infante wrote: Reason for CRM: pt is covid positive.  She states Shanda Bumps told her last week at virtual visit once she is confirmed covid positive she could get in for the infusion.  Pt states she is still pretty sick and she is asking to be scheduled for this.

## 2020-06-18 NOTE — Telephone Encounter (Signed)
I spoke to patient on phone and have her scheduled for tomorrow at 11:30 am for infusion.  Have sent her MyChart message with directions and information.  FYI for Belinda [name] and Dr. Laural Benes.:)

## 2020-06-19 ENCOUNTER — Ambulatory Visit (HOSPITAL_COMMUNITY)
Admission: RE | Admit: 2020-06-19 | Discharge: 2020-06-19 | Disposition: A | Discharge status: Home / Self Care | Payer: HRSA Program | Source: Ambulatory Visit | Attending: Pulmonary Disease | Admitting: Pulmonary Disease

## 2020-06-19 DIAGNOSIS — Z6825 Body mass index (BMI) 25.0-25.9, adult: Secondary | ICD-10-CM | POA: Diagnosis not present

## 2020-06-19 DIAGNOSIS — U071 COVID-19: Secondary | ICD-10-CM | POA: Diagnosis not present

## 2020-06-19 MED ORDER — SODIUM CHLORIDE 0.9 % IV SOLN
1200.0000 mg | Freq: Once | INTRAVENOUS | Status: AC
  Administered 2020-06-19: 1200 mg via INTRAVENOUS
  Filled 2020-06-19: qty 10

## 2020-06-19 MED ORDER — METHYLPREDNISOLONE SODIUM SUCC 125 MG IJ SOLR: 125.0000 mg | Freq: Once | INTRAMUSCULAR | Status: DC | PRN

## 2020-06-19 MED ORDER — ALBUTEROL SULFATE HFA 108 (90 BASE) MCG/ACT IN AERS: 2.0000 | INHALATION_SPRAY | Freq: Once | RESPIRATORY_TRACT | Status: DC | PRN

## 2020-06-19 MED ORDER — FAMOTIDINE IN NACL 20-0.9 MG/50ML-% IV SOLN: 20.0000 mg | Freq: Once | INTRAVENOUS | Status: DC | PRN

## 2020-06-19 MED ORDER — EPINEPHRINE 0.3 MG/0.3ML IJ SOAJ: 0.3000 mg | Freq: Once | INTRAMUSCULAR | Status: DC | PRN

## 2020-06-19 MED ORDER — DIPHENHYDRAMINE HCL 50 MG/ML IJ SOLN: 50.0000 mg | Freq: Once | INTRAMUSCULAR | Status: DC | PRN

## 2020-06-19 MED ORDER — SODIUM CHLORIDE 0.9 % IV SOLN: INTRAVENOUS | Status: DC | PRN

## 2020-06-19 NOTE — Progress Notes (Signed)
  Diagnosis: COVID-19  Physician:Dr Wright  Procedure: Covid Infusion Clinic Med: casirivimab\imdevimab infusion - Provided patient with casirivimab\imdevimab fact sheet for patients, parents and caregivers prior to infusion.  Complications: No immediate complications noted.  Discharge: Discharged home   Belinda [name] 06/19/2020  

## 2020-06-19 NOTE — Discharge Instructions (Signed)

## 2020-06-25 ENCOUNTER — Other Ambulatory Visit: Payer: Self-pay

## 2020-06-25 ENCOUNTER — Telehealth: Payer: Self-pay | Admitting: Family Medicine

## 2020-06-25 ENCOUNTER — Ambulatory Visit
Admission: RE | Admit: 2020-06-25 | Discharge: 2020-06-25 | Disposition: A | Discharge status: Home / Self Care | Payer: Self-pay | Source: Ambulatory Visit | Attending: Family Medicine | Admitting: Family Medicine

## 2020-06-25 DIAGNOSIS — Z1231 Encounter for screening mammogram for malignant neoplasm of breast: Secondary | ICD-10-CM | POA: Insufficient documentation

## 2020-06-25 IMAGING — MG DIGITAL SCREENING BILAT W/ TOMO W/ CAD
6 of 10 series · 6 of 30 positions shown · non-contrast
Comparison: Previous exam(s).

CLINICAL DATA: Screening.

EXAM:
DIGITAL SCREENING BILATERAL MAMMOGRAM WITH TOMO AND CAD

[R CC synth-2D]
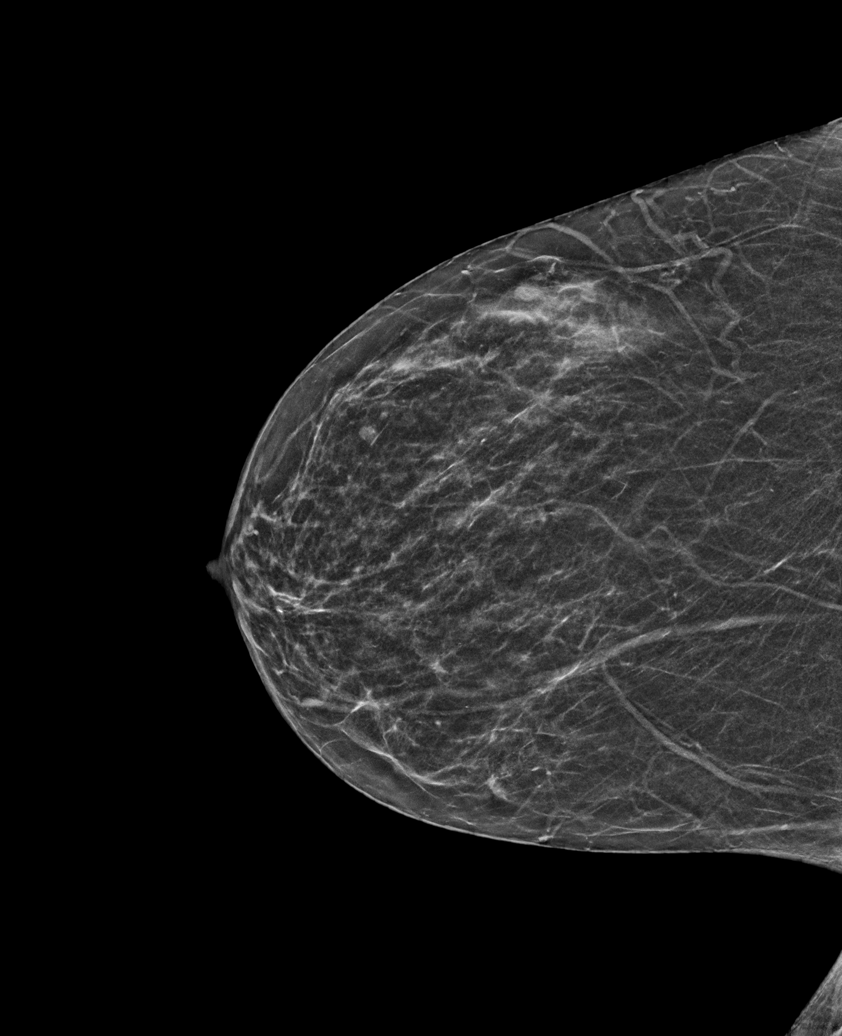

[L CC synth-2D]
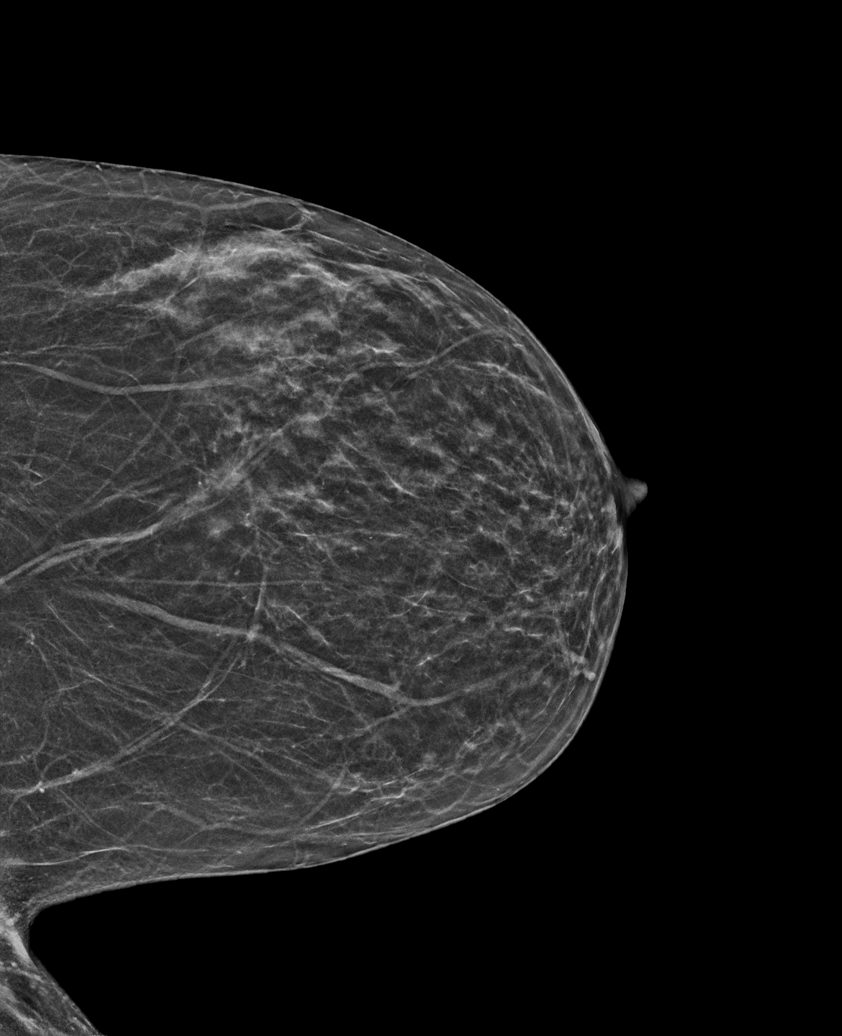

[R MLO synth-2D (1 of 2)]
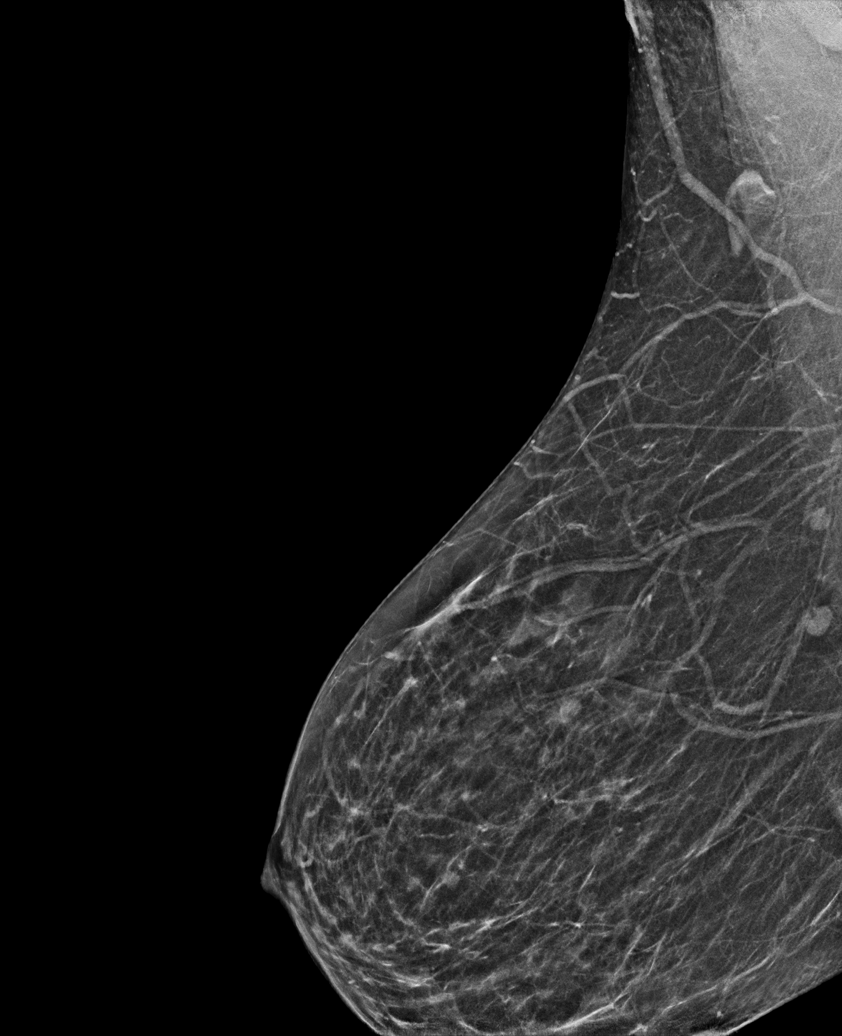

[L MLO synth-2D]
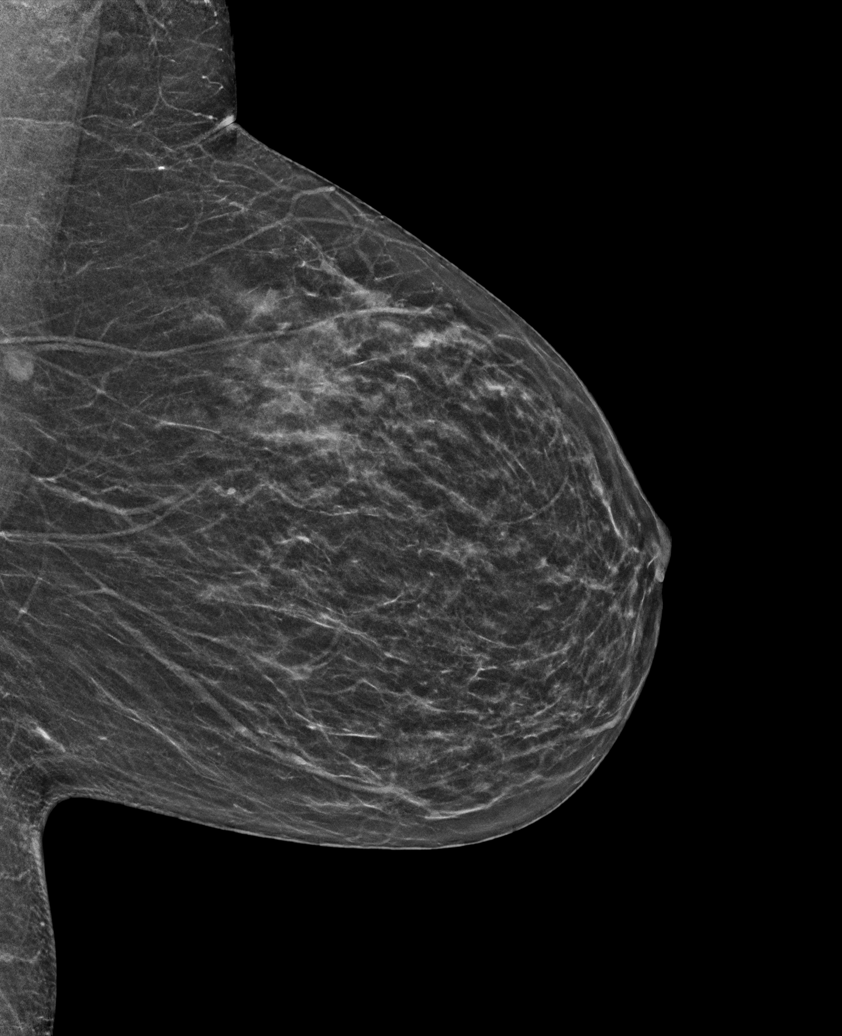

[R MLO synth-2D (2 of 2)]
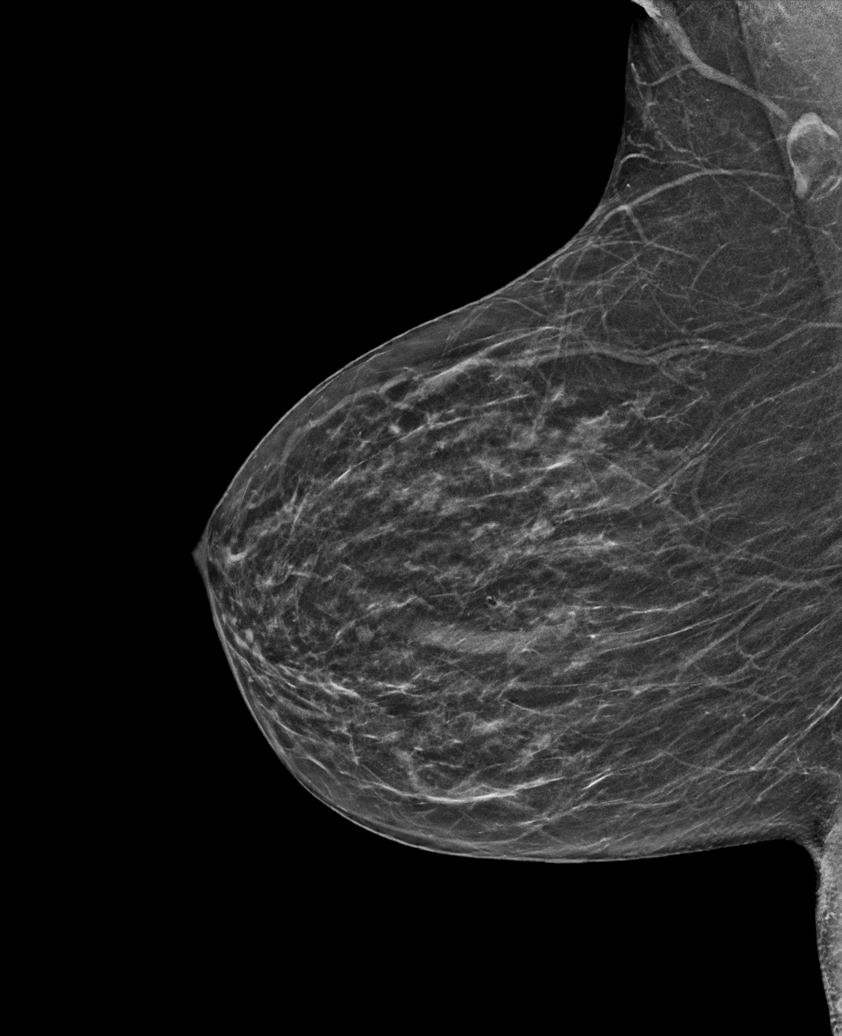

[R CC tomo · tomo slice 23/46.0]
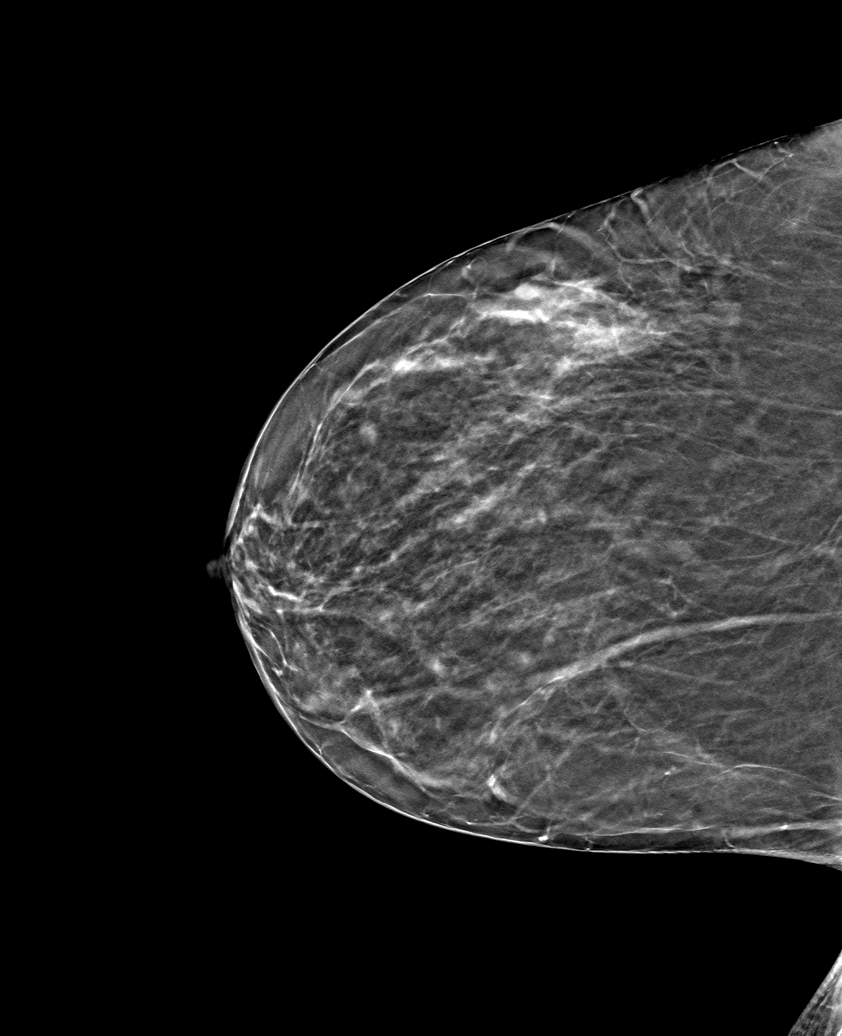

[6 of 30 positions shown; findings below may reference images not displayed]

ACR Breast Density Category b: There are scattered areas of
fibroglandular density.
FINDINGS: There are no findings suspicious for malignancy. Images were
processed with CAD.
IMPRESSION: No mammographic evidence of malignancy. A result letter of this
screening mammogram will be mailed directly to the patient.

RECOMMENDATION:
Screening mammogram in one year. (Code:[id number])

BI-RADS CATEGORY  1: Negative.

## 2020-06-25 NOTE — Telephone Encounter (Signed)
Patient is calling to request a return to work note after testing positive for COVID. There are no available appts for this week to offer to the patient. Please advise [phone]

## 2020-06-25 NOTE — Telephone Encounter (Signed)
scheduled

## 2020-06-25 NOTE — Telephone Encounter (Signed)
Ok given to use a same day

## 2020-06-28 ENCOUNTER — Telehealth (INDEPENDENT_AMBULATORY_CARE_PROVIDER_SITE_OTHER): Payer: HRSA Program | Admitting: Family Medicine

## 2020-06-28 ENCOUNTER — Encounter: Payer: Self-pay | Admitting: Family Medicine

## 2020-06-28 VITALS — Wt 181.0 lb

## 2020-06-28 DIAGNOSIS — U071 COVID-19: Secondary | ICD-10-CM | POA: Diagnosis not present

## 2020-06-28 NOTE — Progress Notes (Signed)
Wt 181 lb (82.1 kg)   BMI 33.11 kg/m    Subjective:    Patient ID: Belinda [name], female    DOB: 1976/06/16, 44 y.o.   MRN: [id number]  HPI: Belinda [name] is a 44 y.o. female  Chief Complaint  Patient presents with  . Follow-up    post covid f/u. requesting work note to get back to work   [employer] still not feeling 100%, but still feeling tired and little congested in the AM. Occasional cough, but still bringing stuff up. She started with symptoms on 9/7 and tested positive on 9/9. She has no fevers. No chills. She is otherwise feeling well with no other concerns or complaints at this time.   Relevant past medical, surgical, family and social history reviewed and updated as indicated. Interim medical history since our last visit reviewed. Allergies and medications reviewed and updated.  Review of Systems  Constitutional: Negative.   Respiratory: Positive for cough. Negative for apnea, choking, chest tightness, shortness of breath, wheezing and stridor.   Cardiovascular: Negative.   Gastrointestinal: Negative.   Neurological: Negative.   Psychiatric/Behavioral: Negative.     Per HPI unless specifically indicated above     Objective:    Wt 181 lb (82.1 kg)   BMI 33.11 kg/m   Wt Readings from Last 3 Encounters:  06/28/20 181 lb (82.1 kg)  06/01/20 184 lb (83.5 kg)  04/30/20 186 lb 3.2 oz (84.5 kg)    Physical Exam Vitals and nursing note reviewed.  Constitutional:      General: She is not in acute distress.    Appearance: Normal appearance. She is not ill-appearing, toxic-appearing or diaphoretic.  HENT:     Head: Normocephalic and atraumatic.     Right Ear: External ear normal.     Left Ear: External ear normal.     Nose: Nose normal.     Mouth/Throat:     Mouth: Mucous membranes are moist.     Pharynx: Oropharynx is clear.  Eyes:     General: No scleral icterus.       Right eye: No discharge.        Left eye: No discharge.     Conjunctiva/sclera:  Conjunctivae normal.     Pupils: Pupils are equal, round, and reactive to light.  Pulmonary:     Effort: Pulmonary effort is normal. No respiratory distress.     Comments: Speaking in full sentences Musculoskeletal:        General: Normal range of motion.     Cervical back: Normal range of motion.  Skin:    Coloration: Skin is not jaundiced or pale.     Findings: No bruising, erythema, lesion or rash.  Neurological:     Mental Status: She is alert and oriented to person, place, and time. Mental status is at baseline.  Psychiatric:        Mood and Affect: Mood normal.        Behavior: Behavior normal.        Thought Content: Thought content normal.        Judgment: Judgment normal.     Results for orders placed or performed in visit on 06/13/20  Novel Coronavirus, NAA ([facility])   Specimen: Nasopharyngeal(NP) swabs in vial transport medium   Nasopharynge  Screenin  Result Value Ref Range   SARS-CoV-2, NAA Detected (A) Not Detected      Assessment & Plan:   Problem List Items Addressed This Visit    None  Visit Diagnoses    COVID-19    -  Primary   Out of quarantine. Feelng better. OK to return to work. Call with any concerns.        Follow up plan: Return if symptoms worsen or fail to improve.   . This visit was completed via MyChart due to the restrictions of the COVID-19 pandemic. All issues as above were discussed and addressed. Physical exam was done as above through visual confirmation on MyChart. If it was felt that the patient should be evaluated in the office, they were directed there. The patient verbally consented to this visit. . Location of the patient: home . Location of the provider: work . Those involved with this call:  . Provider: Olevia Perches, DO . CMA: Elton Sin, CMA . Front Desk/Registration: Adela Ports  . Time spent on call: 10 minutes with patient face to face via video conference. More than 50% of this time was spent in counseling  and coordination of care. 15 minutes total spent in review of patient's record and preparation of their chart.

## 2020-07-16 ENCOUNTER — Telehealth: Payer: Self-pay | Admitting: Family Medicine

## 2020-08-06 ENCOUNTER — Other Ambulatory Visit: Payer: Self-pay

## 2020-08-06 ENCOUNTER — Telehealth: Payer: Self-pay

## 2020-08-06 NOTE — Telephone Encounter (Signed)
Patient is no longer taking this medication, please disregard refill request.

## 2020-08-06 NOTE — Telephone Encounter (Signed)
Medication refill came in by fax today for patient, she is would like a refill of Nortriptyline 10mg  cap, 90 caps, take 1 10mg  cap QHS. Prescription expired on 07/03/20.  Please advise. Last seen 06/28/20

## 2020-08-06 NOTE — Telephone Encounter (Signed)
Please call patient and ask if she is still taking-looks like I discontinued at last visit because she was not taking anymore?

## 2020-08-14 NOTE — Progress Notes (Signed)
..  The following Assist/Replace Program for Venofer from [employer] has been terminated due to program expired 04/04/2020.  Last DOS:11/15/2019

## 2020-09-15 ENCOUNTER — Other Ambulatory Visit: Payer: Self-pay | Admitting: Family Medicine

## 2020-09-15 NOTE — Telephone Encounter (Signed)
Requested medication (s) are due for refill today yes  Requested medication (s) are on the active medication list: yes    Last refill: 03/08/20  #20  5 refills  Future visit scheduled   yes 10/01/20  Notes to clinic: not delegated  Requested Prescriptions  Pending Prescriptions Disp Refills   ondansetron (ZOFRAN) 4 MG tablet [Pharmacy Med Name: ONDANSETRON HCL 4 MG TABLET] 20 tablet 5    Sig: TAKE 1 TABLET (4 MG TOTAL) BY MOUTH EVERY 8 (EIGHT) HOURS AS NEEDED. FOR NAUSEA      Not Delegated - Gastroenterology: Antiemetics Failed - 09/15/2020 10:45 PM      Failed - This refill cannot be delegated      Passed - Valid encounter within last 6 months    Recent Outpatient Visits           2 months ago COVID-19   [facility] [place], [place] P, DO   3 months ago Suspected COVID-19 virus infection   [facility] Valentino Nose, NP   3 months ago Bipolar 1 disorder, depressed (HCC)   [facility] Natural Steps, Megan P, DO   4 months ago Routine general medical examination at a health care facility   [facility], [place] P, DO   4 months ago Acute intractable tension-type headache   [facility] Dorcas Carrow, DO       Future Appointments             In 2 weeks Laural Benes, Oralia Rud, DO [facility]   In 1 month Rickard Patience, MD [facility]

## 2020-09-17 NOTE — Telephone Encounter (Signed)
Routing to provider  

## 2020-09-18 ENCOUNTER — Telehealth: Payer: Self-pay

## 2020-09-18 NOTE — Telephone Encounter (Signed)
Incoming fax from [facility].  Patient has filled a rescue inhaler multiple times in the last 180 days without a preventative inhaler.   Does patient need to be started on a preventative inhaler?

## 2020-09-18 NOTE — Telephone Encounter (Signed)
Please schedule visit with Dr. Laural Benes, may benefit from assessment and spirometry if frequent use of rescue inhaler.

## 2020-09-19 NOTE — Telephone Encounter (Signed)
Please get patient scheduled for a follow up with Laural Benes

## 2020-10-01 ENCOUNTER — Encounter: Payer: Self-pay | Admitting: Family Medicine

## 2020-10-01 ENCOUNTER — Ambulatory Visit: Payer: No Typology Code available for payment source | Admitting: Family Medicine

## 2020-10-01 ENCOUNTER — Other Ambulatory Visit: Payer: Self-pay

## 2020-10-01 VITALS — BP 115/77 | HR 52 | Temp 98.2°F | Wt 170.4 lb

## 2020-10-01 DIAGNOSIS — Z8279 Family history of other congenital malformations, deformations and chromosomal abnormalities: Secondary | ICD-10-CM | POA: Diagnosis not present

## 2020-10-01 DIAGNOSIS — E162 Hypoglycemia, unspecified: Secondary | ICD-10-CM

## 2020-10-01 LAB — BAYER DCA HB A1C WAIVED: HB A1C (BAYER DCA - WAIVED): 5.2 % (ref <7.0)

## 2020-10-01 NOTE — Progress Notes (Signed)
BP 115/77   Pulse (!) 52   Temp 98.2 F (36.8 C) (Oral)   Wt 170 lb 6.4 oz (77.3 kg)   LMP 09/27/2020 (Exact Date)   SpO2 99%   BMI 31.17 kg/m    Subjective:    Patient ID: Belinda [name], female    DOB: 04-25-1976, 44 y.o.   MRN: [id number]  HPI: Belinda [name] is a 44 y.o. female  Chief Complaint  Patient presents with  . 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   . Hypoglycemia    Pt states she has been getting very low BS readings during the night (around 42 to 52). States this only happens at night and she wakes up sweating when her BS is low.    Her brother just found out that he had a congenital heart defect at 44yo. His cardiologist recommended that she get checked out as well. She wants to see cardiology to make sure everything is OK. She has had no chest pain. No SOB, no issues with palpitations or passing out.  She notes that she has been feeling like her sugars go low in the middle of the night. She has been eating about 7:30-8 at night. She is eating protein, grains and carbs. Never happens during the day. Has been having a snack before going to bed with protein about 10:30-11PM, but she's still waking up about 1-2AM with sweats. She will check her sugars and it has gotten into the 40s. This is happening about 2-3x a week, seems to be at random.   She has not been able to eat any meat or dairy for the last couple of months- which has changed her diet significantly. She notes that she has issues with her stomach whenever she eats meat or dairy. She had significant stomach pain when she had covid and that seems to have gotten better. She hasn't been a big fan of meat in the past, so she's OK with not eating this- but  attributes this to her recent weight loss. She is otherwise doing well with no other concerns or complaints at this time.   Relevant past medical, surgical, family and social history reviewed and updated as indicated. Interim medical history since our last visit reviewed. Allergies and medications reviewed and updated.  Review of Systems  Constitutional: Positive for appetite change, chills and diaphoresis. Negative for activity change, fatigue, fever and unexpected weight change.  HENT: Negative.   Respiratory: Negative.   Cardiovascular: Negative.   Gastrointestinal: Positive for abdominal pain. Negative for abdominal distention, anal bleeding, blood in stool, constipation, diarrhea, nausea, rectal pain and vomiting.  Musculoskeletal: Negative.   Skin: Negative.   Neurological: Negative.   Psychiatric/Behavioral: Negative.     Per HPI unless specifically indicated above     Objective:    BP 115/77   Pulse (!) 52   Temp 98.2 F (36.8 C) (Oral)   Wt 170 lb 6.4 oz (77.3 kg)   LMP 09/27/2020 (Exact Date)   SpO2 99%   BMI 31.17 kg/m   Wt Readings from Last 3 Encounters:  10/01/20 170 lb 6.4 oz (77.3 kg)  06/28/20 181 lb (82.1 kg)  06/01/20 184 lb (83.5 kg)  Physical Exam Vitals and nursing note reviewed.  Constitutional:      General: She is not in acute distress.    Appearance: Normal appearance. She is not ill-appearing, toxic-appearing or diaphoretic.  HENT:     Head: Normocephalic and atraumatic.     Right Ear: External ear normal.     Left Ear: External ear normal.     Nose: Nose normal.     Mouth/Throat:     Mouth: Mucous membranes are moist.     Pharynx: Oropharynx is clear.  Eyes:     General: No scleral icterus.       Right eye: No discharge.        Left eye: No discharge.     Extraocular Movements: Extraocular movements intact.     Conjunctiva/sclera: Conjunctivae normal.     Pupils: Pupils are equal, round, and reactive to light.  Cardiovascular:      Rate and Rhythm: Normal rate and regular rhythm.     Pulses: Normal pulses.     Heart sounds: Normal heart sounds. No murmur heard. No friction rub. No gallop.   Pulmonary:     Effort: Pulmonary effort is normal. No respiratory distress.     Breath sounds: Normal breath sounds. No stridor. No wheezing, rhonchi or rales.  Chest:     Chest wall: No tenderness.  Musculoskeletal:        General: Normal range of motion.     Cervical back: Normal range of motion and neck supple.  Skin:    General: Skin is warm and dry.     Capillary Refill: Capillary refill takes less than 2 seconds.     Coloration: Skin is not jaundiced or pale.     Findings: No bruising, erythema, lesion or rash.  Neurological:     General: No focal deficit present.     Mental Status: She is alert and oriented to person, place, and time. Mental status is at baseline.  Psychiatric:        Mood and Affect: Mood normal.        Behavior: Behavior normal.        Thought Content: Thought content normal.        Judgment: Judgment normal.     Results for orders placed or performed in visit on 10/01/20  Bayer DCA Hb A1c Waived  Result Value Ref Range   HB A1C (BAYER DCA - WAIVED) 5.2 <7.0 %      Assessment & Plan:   Problem List Items Addressed This Visit   None   Visit Diagnoses    Hypoglycemia    -  Primary   Encouraged increased protein with meals and a snack before bed with protein. A1c normal today at 5.2. Continue to monitor if getting worse, let us know.    Relevant Orders   Bayer DCA Hb A1c Waived (Completed)   Family history of congenital anomaly of cardiovascular system       Referral to cardiology made today. Call with any concerns.    Relevant Orders   Ambulatory referral to Cardiology       Follow up plan: Return if symptoms worsen or fail to improve.

## 2020-10-01 NOTE — Patient Instructions (Addendum)
Preventing Hypoglycemia Hypoglycemia occurs when the level of sugar (glucose) in the blood is too low. Hypoglycemia can happen in people who do or do not have diabetes (diabetes mellitus). It can develop quickly, and it can be a medical emergency. For most people with diabetes, a blood glucose level below 70 mg/dL (3.9 mmol/L) is considered hypoglycemia. Glucose is a type of sugar that provides the body's main source of energy. Certain hormones (insulin and glucagon) control the level of glucose in the blood. Insulin lowers blood glucose, and glucagon increases blood glucose. Hypoglycemia can result from having too much insulin in the bloodstream, or from not eating enough food that contains glucose. Your risk for hypoglycemia is higher:  If you take insulin or diabetes medicines to help lower your blood glucose or help your body make more insulin.  If you skip or delay a meal or snack.  If you are ill.  During and after exercise. You can prevent hypoglycemia by working with your health care provider to adjust your meal plan as needed and by taking other precautions. How can hypoglycemia affect me? Mild symptoms Mild hypoglycemia may not cause any symptoms. If you do have symptoms, they may include:  Hunger.  Anxiety.  Sweating and feeling clammy.  Dizziness or feeling light-headed.  Sleepiness.  Nausea.  Increased heart rate.  Headache.  Blurry vision.  Irritability.  Tingling or numbness around the mouth, lips, or tongue.  A change in coordination.  Restless sleep. If mild hypoglycemia is not recognized and treated, it can quickly become moderate or severe hypoglycemia. Moderate symptoms Moderate hypoglycemia can cause:  Mental confusion and poor judgment.  Behavior changes.  Weakness.  Irregular heartbeat. Severe symptoms Severe hypoglycemia is a medical emergency. It can cause:  Fainting.  Seizures.  Loss of consciousness (coma).  Death. What  nutrition changes can be made?  Work with your health care provider or diet and nutrition specialist (dietitian) to make a healthy meal plan that is right for you. Follow your meal plan carefully.  Eat meals at regular times.  If recommended by your health care provider, have snacks between meals.  Donot skip or delay meals or snacks. You can be at risk for hypoglycemia if you are not getting enough carbohydrates. What lifestyle changes can be made?   Work closely with your health care provider to manage your blood glucose. Make sure you know: ? Your goal blood glucose levels. ? How and when to check your blood glucose. ? The symptoms of hypoglycemia. It is important to treat it right away to keep it from becoming severe.  Do not drink alcohol on an empty stomach.  When you are ill, check your blood glucose more often than usual. Follow your sick day plan whenever you cannot eat or drink normally. Make this plan in advance with your health care provider.  Always check your blood glucose before, during, and after exercise. How is this treated? This condition can often be treated by immediately eating or drinking something that contains sugar, such as:  Fruit juice, 4-6 oz (120-150 mL).  Regular (not diet) soda, 4-6 oz (120-150 mL).  Low-fat milk, 4 oz (120 mL).  Several pieces of hard candy.  Sugar or honey, 1 Tbsp (15 mL). Treating hypoglycemia if you have diabetes If you are alert and able to swallow safely, follow the 15:15 rule:  Take 15 grams of a rapid-acting carbohydrate. Talk with your health care provider about how much you should take.  Rapid-acting options  include: ? Glucose pills (take 15 grams). ? 6-8 pieces of hard candy. ? 4-6 oz (120-150 mL) of fruit juice. ? 4-6 oz (120-150 mL) of regular (not diet) soda.  Check your blood glucose 15 minutes after you take the carbohydrate.  If the repeat blood glucose level is still at or below 70 mg/dL (3.9 mmol/L),  take 15 grams of a carbohydrate again.  If your blood glucose level does not increase above 70 mg/dL (3.9 mmol/L) after 3 tries, seek emergency medical care.  After your blood glucose level returns to normal, eat a meal or a snack within 1 hour. Treating severe hypoglycemia Severe hypoglycemia is when your blood glucose level is at or below 54 mg/dL (3 mmol/L). Severe hypoglycemia is a medical emergency. Get medical help right away. If you have severe hypoglycemia and you cannot eat or drink, you may need an injection of glucagon. A family member or close friend should learn how to check your blood glucose and how to give you a glucagon injection. Ask your health care provider if you need to have an emergency glucagon injection kit available. Severe hypoglycemia may need to be treated in a hospital. The treatment may include getting glucose through an IV. You may also need treatment for the cause of your hypoglycemia. Where to find more information  American Diabetes Association: www.diabetes.org  National Institute of Diabetes and Digestive and Kidney Diseases: www.niddk.nih.gov Contact a health care provider if:  You have problems keeping your blood glucose in your target range.  You have frequent episodes of hypoglycemia. Get help right away if:  You continue to have hypoglycemia symptoms after eating or drinking something containing glucose.  Your blood glucose level is at or below 54 mg/dL (3 mmol/L).  You faint.  You have a seizure. These symptoms may represent a serious problem that is an emergency. Do not wait to see if the symptoms will go away. Get medical help right away. Call your local emergency services (911 in the [place] Summary  Know the symptoms of hypoglycemia, and when you are at risk for it (such as during exercise or when you are sick). Check your blood glucose often when you are at risk for hypoglycemia.  Hypoglycemia can develop quickly, and it can be dangerous  if it is not treated right away. If you have a history of severe hypoglycemia, make sure you know how to use your glucagon injection kit.  Make sure you know how to treat hypoglycemia. Keep a carbohydrate snack available when you may be at risk for hypoglycemia. This information is not intended to replace advice given to you by your health care provider. Make sure you discuss any questions you have with your health care provider. Document Revised: 01/14/2019 Document Reviewed: 05/20/2017 Elsevier Patient Education  2020 [employer] Hypoglycemia Hypoglycemia occurs when the level of sugar (glucose) in the blood is too low. Hypoglycemia can happen in people who do or do not have diabetes. It can develop quickly, and it can be a medical emergency. For most people with diabetes, a blood glucose level below 70 mg/dL (3.9 mmol/L) is considered hypoglycemia. Glucose is a type of sugar that provides the body's main source of energy. Certain hormones (insulin and glucagon) control the level of glucose in the blood. Insulin lowers blood glucose, and glucagon raises blood glucose. Hypoglycemia can result from having too much insulin in the bloodstream, or from not eating enough food that contains glucose. You may also have reactive hypoglycemia, which happens   within 4 hours after eating a meal. What are the causes? Hypoglycemia occurs most often in people who have diabetes and may be caused by:  Diabetes medicine.  Not eating enough, or not eating often enough.  Increased physical activity.  Drinking alcohol on an empty stomach. If you do not have diabetes, hypoglycemia may be caused by:  A tumor in the pancreas.  Not eating enough, or not eating for long periods at a time (fasting).  A severe infection or illness.  Certain medicines. What increases the risk? Hypoglycemia is more likely to develop in:  People who have diabetes and take medicines to lower blood glucose.  People who abuse  alcohol.  People who have a severe illness. What are the signs or symptoms? Mild symptoms Mild hypoglycemia may not cause any symptoms. If you do have symptoms, they may include:  Hunger.  Anxiety.  Sweating and feeling clammy.  Dizziness or feeling light-headed.  Sleepiness.  Nausea.  Increased heart rate.  Headache.  Blurry vision.  Irritability.  Tingling or numbness around the mouth, lips, or tongue.  A change in coordination.  Restless sleep. Moderate symptoms Moderate hypoglycemia can cause:  Mental confusion and poor judgment.  Behavior changes.  Weakness.  Irregular heartbeat. Severe symptoms Severe hypoglycemia is a medical emergency. It can cause:  Fainting.  Seizures.  Loss of consciousness (coma).  Death. How is this diagnosed? Hypoglycemia is diagnosed with a blood test to measure your blood glucose level. This blood test is done while you are having symptoms. Your health care provider may also do a physical exam and review your medical history. How is this treated? This condition can often be treated by immediately eating or drinking something that contains sugar, such as:  Fruit juice, 4-6 oz (120-150 mL).  Regular soda (not diet soda), 4-6 oz (120-150 mL).  Low-fat milk, 4 oz (120 mL).  Several pieces of hard candy.  Sugar or honey, 1 Tbsp (15 mL). Treating hypoglycemia if you have diabetes If you are alert and able to swallow safely, follow the 15:15 rule:  Take 15 grams of a rapid-acting carbohydrate. Talk with your health care provider about how much you should take.  Rapid-acting options include: ? Glucose pills (take 15 grams). ? 6-8 pieces of hard candy. ? 4-6 oz (120-150 mL) of fruit juice. ? 4-6 oz (120-150 mL) of regular (not diet) soda. ? 1 Tbsp (15 mL) honey or sugar.  Check your blood glucose 15 minutes after you take the carbohydrate.  If the repeat blood glucose level is still at or below 70 mg/dL (3.9  mmol/L), take 15 grams of a carbohydrate again.  If your blood glucose level does not increase above 70 mg/dL (3.9 mmol/L) after 3 tries, seek emergency medical care.  After your blood glucose level returns to normal, eat a meal or a snack within 1 hour.  Treating severe hypoglycemia Severe hypoglycemia is when your blood glucose level is at or below 54 mg/dL (3 mmol/L). Severe hypoglycemia is a medical emergency. Get medical help right away. If you have severe hypoglycemia and you cannot eat or drink, you may need an injection of glucagon. A family member or close friend should learn how to check your blood glucose and how to give you a glucagon injection. Ask your health care provider if you need to have an emergency glucagon injection kit available. Severe hypoglycemia may need to be treated in a hospital. The treatment may include getting glucose through an IV. You   may also need treatment for the cause of your hypoglycemia. Follow these instructions at home:  General instructions  Take over-the-counter and prescription medicines only as told by your health care provider.  Monitor your blood glucose as told by your health care provider.  Limit alcohol intake to no more than 1 drink a day for nonpregnant women and 2 drinks a day for men. One drink equals 12 oz of beer (355 mL), 5 oz of wine (148 mL), or 1 oz of hard liquor (44 mL).  Keep all follow-up visits as told by your health care provider. This is important. If you have diabetes:  Always have a rapid-acting carbohydrate snack with you to treat low blood glucose.  Follow your diabetes management plan as directed. Make sure you: ? Know the symptoms of hypoglycemia. It is important to treat it right away to prevent it from becoming severe. ? Take your medicines as directed. ? Follow your exercise plan. ? Follow your meal plan. Eat on time, and do not skip meals. ? Check your blood glucose as often as directed. Always check before  and after exercise. ? Follow your sick day plan whenever you cannot eat or drink normally. Make this plan in advance with your health care provider.  Share your diabetes management plan with people in your workplace, school, and household.  Check your urine for ketones when you are ill and as told by your health care provider.  Carry a medical alert card or wear medical alert jewelry. Contact a health care provider if:  You have problems keeping your blood glucose in your target range.  You have frequent episodes of hypoglycemia. Get help right away if:  You continue to have hypoglycemia symptoms after eating or drinking something containing glucose.  Your blood glucose is at or below 54 mg/dL (3 mmol/L).  You have a seizure.  You faint. These symptoms may represent a serious problem that is an emergency. Do not wait to see if the symptoms will go away. Get medical help right away. Call your local emergency services (911 in the [place] Summary  Hypoglycemia occurs when the level of sugar (glucose) in the blood is too low.  Hypoglycemia can happen in people who do or do not have diabetes. It can develop quickly, and it can be a medical emergency.  Make sure you know the symptoms of hypoglycemia and how to treat it.  Always have a rapid-acting carbohydrate snack with you to treat low blood sugar. This information is not intended to replace advice given to you by your health care provider. Make sure you discuss any questions you have with your health care provider. Document Revised: 03/15/2018 Document Reviewed: 10/26/2015 Elsevier Patient Education  2020 Elsevier Inc.  

## 2020-10-17 ENCOUNTER — Telehealth (INDEPENDENT_AMBULATORY_CARE_PROVIDER_SITE_OTHER): Payer: No Typology Code available for payment source | Admitting: Nurse Practitioner

## 2020-10-17 ENCOUNTER — Encounter: Payer: Self-pay | Admitting: Nurse Practitioner

## 2020-10-17 DIAGNOSIS — T7840XA Allergy, unspecified, initial encounter: Secondary | ICD-10-CM

## 2020-10-17 DIAGNOSIS — R21 Rash and other nonspecific skin eruption: Secondary | ICD-10-CM | POA: Diagnosis not present

## 2020-10-17 MED ORDER — PREDNISONE 10 MG PO TABS: ORAL_TABLET | ORAL | 0 refills | Status: DC

## 2020-10-17 MED ORDER — FAMOTIDINE 20 MG PO TABS: 20.0000 mg | ORAL_TABLET | Freq: Every day | ORAL | 0 refills | Status: DC

## 2020-10-17 MED ORDER — EPINEPHRINE 0.3 MG/0.3ML IJ SOAJ: 0.3000 mg | INTRAMUSCULAR | 2 refills | Status: DC | PRN

## 2020-10-17 NOTE — Assessment & Plan Note (Signed)
Acute and improved post Benadryl self treatment at home.  Suspect related to allergic reaction to substance in her daily Vitamin drink, no other triggers taken this morning.  At this time recommend she avoid this drink.  Will send in Prednisone taper to help with remainder of symptoms, burning and mild rash, + Famotidine.  Epi pen sent in for emergency use only.  If subsequent reaction presents she is to alert provider immediately, or go directly to ER, may require allergy testing if ongoing reactions.

## 2020-10-17 NOTE — Progress Notes (Signed)
LMP 09/27/2020 (Exact Date)    Subjective:    Patient ID: Belinda [name], female    DOB: 1976-09-11, 45 y.o.   MRN: [id number]  HPI: Belinda [name] is a 45 y.o. female  Chief Complaint  Patient presents with  . Rash    Started this morning after drinking her vitamin drink. Arms and hands started sweling, took OTC Benadryl. Arms have a rash that looks like goose bumps and hurts like a sunburn    . This visit was completed via MyChart due to the restrictions of the COVID-19 pandemic. All issues as above were discussed and addressed. Physical exam was done as above through visual confirmation on MyChart. If it was felt that the patient should be evaluated in the office, they were directed there. The patient verbally consented to this visit. . Location of the patient: home . Location of the provider: work . Those involved with this call:  . Provider: Aura Dials, DNP . CMA: Wilhemena Durie, CMA . Front Desk/Registration: Harriet Pho  . Time spent on call: 21 minutes with patient face to face via video conference. More than 50% of this time was spent in counseling and coordination of care. 15 minutes total spent in review of patient's record and preparation of their chart.  . I verified patient identity using two factors (patient name and date of birth). Patient consents verbally to being seen via telemedicine visit today.    RASH After drinking Vitamin drink this morning had swelling and rash on arms & hands.  Took Benadryl which helped with symptoms.  Continues to have burning sensation to arms and feet.  The rash is now gone, was like, hives raised and red.  Now looks like goose bumps and not red.  The vitamin drink is not new -- she thought it was a Niacin flush which her husband has had before. Duration:  days  Location: hands and arms  Itching: no Burning: yes Redness: yes Oozing: no Scaling: no Blisters: no Painful: no Fevers: no Change in  detergents/soaps/personal care products: no Recent illness: no Recent travel:no History of same: no Context: stable Alleviating factors: benadryl Treatments attempted:benadryl Shortness of breath: no  Throat/tongue swelling: no Myalgias/arthralgias: no  Relevant past medical, surgical, family and social history reviewed and updated as indicated. Interim medical history since our last visit reviewed. Allergies and medications reviewed and updated.  Review of Systems  Constitutional: Positive for fatigue. Negative for activity change, appetite change, chills and fever.  HENT: Negative.   Respiratory: Negative.   Cardiovascular: Negative.   Skin: Positive for rash.  Neurological: Negative.     Per HPI unless specifically indicated above     Objective:    LMP 09/27/2020 (Exact Date)   Wt Readings from Last 3 Encounters:  10/01/20 170 lb 6.4 oz (77.3 kg)  06/28/20 181 lb (82.1 kg)  06/01/20 184 lb (83.5 kg)    Physical Exam Vitals and nursing note reviewed.  Constitutional:      General: She is awake. She is not in acute distress.    Appearance: She is well-developed. She is not ill-appearing.  HENT:     Head: Normocephalic.     Right Ear: Hearing normal.     Left Ear: Hearing normal.  Eyes:     General: Lids are normal.        Right eye: No discharge.        Left eye: No discharge.     Conjunctiva/sclera: Conjunctivae normal.  Pulmonary:  Effort: Pulmonary effort is normal. No accessory muscle usage or respiratory distress.  Musculoskeletal:     Cervical back: Normal range of motion.  Skin:    Findings: No rash.     Comments: Rash no longer present and unable to view on virtual, she reports only small goose bump areas with no discoloration are now present.  Neurological:     Mental Status: She is alert and oriented to person, place, and time.  Psychiatric:        Attention and Perception: Attention normal.        Mood and Affect: Mood normal.         Behavior: Behavior normal. Behavior is cooperative.        Thought Content: Thought content normal.        Judgment: Judgment normal.     Results for orders placed or performed in visit on 10/01/20  Bayer DCA Hb A1c Waived  Result Value Ref Range   HB A1C (BAYER DCA - WAIVED) 5.2 <7.0 %      Assessment & Plan:   Problem List Items Addressed This Visit      Musculoskeletal and Integument   Rash due to allergy    Acute and improved post Benadryl self treatment at home.  Suspect related to allergic reaction to substance in her daily Vitamin drink, no other triggers taken this morning.  At this time recommend she avoid this drink.  Will send in Prednisone taper to help with remainder of symptoms, burning and mild rash, + Famotidine.  Epi pen sent in for emergency use only.  If subsequent reaction presents she is to alert provider immediately, or go directly to ER, may require allergy testing if ongoing reactions.         I discussed the assessment and treatment plan with the patient. The patient was provided an opportunity to ask questions and all were answered. The patient agreed with the plan and demonstrated an understanding of the instructions.   The patient was advised to call back or seek an in-person evaluation if the symptoms worsen or if the condition fails to improve as anticipated.   I provided 21+ minutes of time during this encounter.  Follow up plan: Return if symptoms worsen or fail to improve.

## 2020-10-17 NOTE — Patient Instructions (Signed)
Rash, Adult  A rash is a change in the color of your skin. A rash can also change the way your skin feels. There are many different conditions and factors that can cause a rash. Follow these instructions at home: The goal of treatment is to stop the itching and keep the rash from spreading. Watch for any changes in your symptoms. Let your doctor know about them. Follow these instructions to help with your condition: Medicine Take or apply over-the-counter and prescription medicines only as told by your doctor. These may include medicines:  To treat red or swollen skin (corticosteroid creams).  To treat itching.  To treat an allergy (oral antihistamines).  To treat very bad symptoms (oral corticosteroids).   Skin care  Put cool cloths (compresses) on the affected areas.  Do not scratch or rub your skin.  Avoid covering the rash. Make sure that the rash is exposed to air as much as possible. Managing itching and discomfort  Avoid hot showers or baths. These can make itching worse. A cold shower may help.  Try taking a bath with: ? Epsom salts. You can get these at your local pharmacy or grocery store. Follow the instructions on the package. ? Baking soda. Pour a small amount into the bath as told by your doctor. ? Colloidal oatmeal. You can get this at your local pharmacy or grocery store. Follow the instructions on the package.  Try putting baking soda paste onto your skin. Stir water into baking soda until it gets like a paste.  Try putting on a lotion that relieves itchiness (calamine lotion).  Keep cool and out of the sun. Sweating and being hot can make itching worse. General instructions  Rest as needed.  Drink enough fluid to keep your pee (urine) pale yellow.  Wear loose-fitting clothing.  Avoid scented soaps, detergents, and perfumes. Use gentle soaps, detergents, perfumes, and other cosmetic products.  Avoid anything that causes your rash. Keep a journal to help  track what causes your rash. Write down: ? What you eat. ? What cosmetic products you use. ? What you drink. ? What you wear. This includes jewelry.  Keep all follow-up visits as told by your doctor. This is important.   Contact a doctor if:  You sweat at night.  You lose weight.  You pee (urinate) more than normal.  You pee less than normal, or you notice that your pee is a darker color than normal.  You feel weak.  You throw up (vomit).  Your skin or the whites of your eyes look yellow (jaundice).  Your skin: ? Tingles. ? Is numb.  Your rash: ? Does not go away after a few days. ? Gets worse.  You are: ? More thirsty than normal. ? More tired than normal.  You have: ? New symptoms. ? Pain in your belly (abdomen). ? A fever. ? Watery poop (diarrhea). Get help right away if:  You have a fever and your symptoms suddenly get worse.  You start to feel mixed up (confused).  You have a very bad headache or a stiff neck.  You have very bad joint pains or stiffness.  You have jerky movements that you cannot control (seizure).  Your rash covers all or most of your body. The rash may or may not be painful.  You have blisters that: ? Are on top of the rash. ? Grow larger. ? Grow together. ? Are painful. ? Are inside your nose or mouth.  You have   a rash that: ? Looks like purple pinprick-sized spots all over your body. ? Has a "bull's eye" or looks like a target. ? Is red and painful, causes your skin to peel, and is not from being in the sun too long. Summary  A rash is a change in the color of your skin. A rash can also change the way your skin feels.  The goal of treatment is to stop the itching and keep the rash from spreading.  Take or apply over-the-counter and prescription medicines only as told by your doctor.  Contact a doctor if you have new symptoms or symptoms that get worse.  Keep all follow-up visits as told by your doctor. This is  important. This information is not intended to replace advice given to you by your health care provider. Make sure you discuss any questions you have with your health care provider. Document Revised: 01/14/2019 Document Reviewed: 04/26/2018 Elsevier Patient Education  2021 Elsevier Inc.  

## 2020-11-05 ENCOUNTER — Other Ambulatory Visit: Payer: Self-pay

## 2020-11-05 ENCOUNTER — Encounter: Payer: Self-pay | Admitting: Nurse Practitioner

## 2020-11-05 ENCOUNTER — Ambulatory Visit: Payer: No Typology Code available for payment source | Admitting: Nurse Practitioner

## 2020-11-05 VITALS — BP 115/76 | HR 54 | Temp 98.6°F | Wt 173.4 lb

## 2020-11-05 DIAGNOSIS — E559 Vitamin D deficiency, unspecified: Secondary | ICD-10-CM | POA: Diagnosis not present

## 2020-11-05 DIAGNOSIS — R5383 Other fatigue: Secondary | ICD-10-CM

## 2020-11-05 DIAGNOSIS — Z9884 Bariatric surgery status: Secondary | ICD-10-CM

## 2020-11-05 NOTE — Patient Instructions (Signed)

## 2020-11-05 NOTE — Progress Notes (Addendum)
BP 115/76    Pulse (!) 54    Temp 98.6 F (37 C) (Oral)    Wt 173 lb 6.4 oz (78.7 kg)    SpO2 99%    BMI 31.72 kg/m    Subjective:    Patient ID: Belinda [name], female    DOB: [birth year], 45 y.o.   MRN: [id number]  HPI: Belinda [name] is a 45 y.o. female  Chief Complaint  Patient presents with   Skin Problem    Pt states she has been having dry skin for the past few weeks, states she is worried about anemia. States she has been feeling really fatigued and tired lately   FATIGUE Patient states she feels completely exhausted, out of breath, dizzy and feels like her heart will race for a minute.  Patient states she hasn't been feeling well for a couple of weeks and then last week the exhaustion worsened.  Patient states she has been having leg cramps especially at work.  Patient has a history of anemia.  Patient is unsure of the type of anemia she had in the past.  Patient does have a history of gastric bypass.  Patient has an appointment with hematology next week.  Patient was on Vitamin B12 shots in the past due to not absorbing the vitamin.   Duration:  weeks Severity: 8/10  Onset: gradual Context when symptoms started:  unknown Symptoms improve with rest: no  Depressive symptoms: no Stress/anxiety: yes Patient states she always has stress in her life and she works a lot. Insomnia: yes hard to stay asleep Snoring: no Observed apnea by bed partner: no Daytime hypersomnolence:yes Wakes feeling refreshed: no History of sleep study: yes it has been a long time. Dysnea on exertion:  yes Orthopnea/PND: no Chest pain: no Chronic cough: no Lower extremity edema: no Arthralgias:no Myalgias: no Weakness: yes Rash: Skin has been really dry. Patient thinks this might be due to the weather.    Relevant past medical, surgical, family and social history reviewed and updated as indicated. Interim medical history since our last visit reviewed. Allergies and medications reviewed and  updated.  Review of Systems  Constitutional: Positive for fatigue.  Respiratory: Positive for shortness of breath. Negative for cough.        Heart racing.  Cardiovascular: Negative for chest pain and leg swelling.  Neurological: Positive for dizziness.  Psychiatric/Behavioral: Positive for sleep disturbance.    Per HPI unless specifically indicated above     Objective:    BP 115/76    Pulse (!) 54    Temp 98.6 F (37 C) (Oral)    Wt 173 lb 6.4 oz (78.7 kg)    SpO2 99%    BMI 31.72 kg/m   Wt Readings from Last 3 Encounters:  11/05/20 173 lb 6.4 oz (78.7 kg)  10/01/20 170 lb 6.4 oz (77.3 kg)  06/28/20 181 lb (82.1 kg)    Physical Exam Vitals and nursing note reviewed.  Constitutional:      General: She is not in acute distress.    Appearance: Normal appearance. She is normal weight. She is not ill-appearing, toxic-appearing or diaphoretic.  HENT:     Head: Normocephalic.     Right Ear: External ear normal.     Left Ear: External ear normal.     Nose: Nose normal.     Mouth/Throat:     Mouth: Mucous membranes are moist.     Pharynx: Oropharynx is clear.  Eyes:  General:        Right eye: No discharge.        Left eye: No discharge.     Extraocular Movements: Extraocular movements intact.     Conjunctiva/sclera: Conjunctivae normal.     Pupils: Pupils are equal, round, and reactive to light.  Cardiovascular:     Rate and Rhythm: Normal rate and regular rhythm.     Heart sounds: No murmur heard.   Pulmonary:     Effort: Pulmonary effort is normal. No respiratory distress.     Breath sounds: Normal breath sounds. No wheezing or rales.  Musculoskeletal:     Cervical back: Normal range of motion and neck supple.  Skin:    General: Skin is warm and dry.     Capillary Refill: Capillary refill takes less than 2 seconds.  Neurological:     General: No focal deficit present.     Mental Status: She is alert and oriented to person, place, and time. Mental status is at  baseline.  Psychiatric:        Mood and Affect: Mood normal.        Behavior: Behavior normal.        Thought Content: Thought content normal.        Judgment: Judgment normal.     Results for orders placed or performed in visit on 11/05/20  Vitamin D (25 hydroxy)  Result Value Ref Range   Vit D, 25-Hydroxy 12.6 (L) 30.0 - 100.0 ng/mL  Comp Met (CMET)  Result Value Ref Range   Glucose 91 65 - 99 mg/dL   BUN 8 6 - 24 mg/dL   Creatinine, Ser 0.74 0.57 - 1.00 mg/dL   GFR calc non Af Amer 99 >59 mL/min/1.73   GFR calc Af Amer 114 >59 mL/min/1.73   BUN/Creatinine Ratio 11 9 - 23   Sodium 140 134 - 144 mmol/L   Potassium 4.6 3.5 - 5.2 mmol/L   Chloride 105 96 - 106 mmol/L   CO2 22 20 - 29 mmol/L   Calcium 9.0 8.7 - 10.2 mg/dL   Total Protein 6.2 6.0 - 8.5 g/dL   Albumin 4.2 3.8 - 4.8 g/dL   Globulin, Total 2.0 1.5 - 4.5 g/dL   Albumin/Globulin Ratio 2.1 1.2 - 2.2   Bilirubin Total 0.3 0.0 - 1.2 mg/dL   Alkaline Phosphatase 51 44 - 121 IU/L   AST 13 0 - 40 IU/L   ALT 15 0 - 32 IU/L  TSH  Result Value Ref Range   TSH 1.840 0.450 - 4.500 uIU/mL  T4, free  Result Value Ref Range   Free T4 1.18 0.82 - 1.77 ng/dL  Anemia Profile B  Result Value Ref Range   Total Iron Binding Capacity 336 250 - 450 ug/dL   UIBC 234 131 - 425 ug/dL   Iron 102 27 - 159 ug/dL   Iron Saturation 30 15 - 55 %   Ferritin 30 15 - 150 ng/mL   Vitamin B-12 522 232 - 1,245 pg/mL   Folate 9.1 >3.0 ng/mL   WBC 6.8 3.4 - 10.8 x10E3/uL   RBC 4.29 3.77 - 5.28 x10E6/uL   Hemoglobin 13.5 11.1 - 15.9 g/dL   Hematocrit 40.9 34.0 - 46.6 %   MCV 95 79 - 97 fL   MCH 31.5 26.6 - 33.0 pg   MCHC 33.0 31.5 - 35.7 g/dL   RDW 12.2 11.7 - 15.4 %   Platelets 201 150 - 450 x10E3/uL   Neutrophils  57 Not Estab. %   Lymphs 33 Not Estab. %   Monocytes 7 Not Estab. %   Eos 2 Not Estab. %   Basos 1 Not Estab. %   Neutrophils Absolute 3.9 1.4 - 7.0 x10E3/uL   Lymphocytes Absolute 2.3 0.7 - 3.1 x10E3/uL   Monocytes  Absolute 0.5 0.1 - 0.9 x10E3/uL   EOS (ABSOLUTE) 0.1 0.0 - 0.4 x10E3/uL   Basophils Absolute 0.1 0.0 - 0.2 x10E3/uL   Immature Granulocytes 0 Not Estab. %   Immature Grans (Abs) 0.0 0.0 - 0.1 x10E3/uL   Retic Ct Pct 0.9 0.6 - 2.6 %      Assessment & Plan:   Problem List Items Addressed This Visit      Other   Bariatric surgery status    Patient was previously on supplements for anemia post surgery but has not been on them since anemia resolved.  Lab work drawn today.       Other Visit Diagnoses    Fatigue, unspecified type    -  Primary   Will draw lab work today. Will treat anemia if necessary.  Will discuss appointment with hematology based on blood work.     Relevant Orders   Vitamin D (25 hydroxy) (Completed)   Comp Met (CMET) (Completed)   TSH (Completed)   T4, free (Completed)   Anemia Profile B (Completed)   Vitamin D deficiency       Labs drawn today.    Relevant Medications   Cholecalciferol 1.25 MG (50000 UT) TABS       Follow up plan: Return in about 1 month (around 12/03/2020) for Fatigue (Will follow up sooner if necessary based on lab work).

## 2020-11-06 LAB — COMPREHENSIVE METABOLIC PANEL
ALT: 15 IU/L (ref 0–32)
AST: 13 IU/L (ref 0–40)
Albumin/Globulin Ratio: 2.1 (ref 1.2–2.2)
Albumin: 4.2 g/dL (ref 3.8–4.8)
Alkaline Phosphatase: 51 IU/L (ref 44–121)
BUN/Creatinine Ratio: 11 (ref 9–23)
BUN: 8 mg/dL (ref 6–24)
Bilirubin Total: 0.3 mg/dL (ref 0.0–1.2)
CO2: 22 mmol/L (ref 20–29)
Calcium: 9 mg/dL (ref 8.7–10.2)
Chloride: 105 mmol/L (ref 96–106)
Creatinine, Ser: 0.74 mg/dL (ref 0.57–1.00)
GFR calc Af Amer: 114 mL/min/{1.73_m2} (ref >59)
GFR calc non Af Amer: 99 mL/min/{1.73_m2} (ref >59)
Globulin, Total: 2 g/dL (ref 1.5–4.5)
Glucose: 91 mg/dL (ref 65–99)
Potassium: 4.6 mmol/L (ref 3.5–5.2)
Sodium: 140 mmol/L (ref 134–144)
Total Protein: 6.2 g/dL (ref 6.0–8.5)

## 2020-11-06 LAB — ANEMIA PROFILE B
Basophils Absolute: 0.1 10*3/uL (ref 0.0–0.2)
Basos: 1 %
EOS (ABSOLUTE): 0.1 10*3/uL (ref 0.0–0.4)
Eos: 2 %
Ferritin: 30 ng/mL (ref 15–150)
Folate: 9.1 ng/mL (ref >3.0)
Hematocrit: 40.9 % (ref 34.0–46.6)
Hemoglobin: 13.5 g/dL (ref 11.1–15.9)
Immature Grans (Abs): 0 10*3/uL (ref 0.0–0.1)
Immature Granulocytes: 0 %
Iron Saturation: 30 % (ref 15–55)
Iron: 102 ug/dL (ref 27–159)
Lymphocytes Absolute: 2.3 10*3/uL (ref 0.7–3.1)
Lymphs: 33 %
MCH: 31.5 pg (ref 26.6–33.0)
MCHC: 33 g/dL (ref 31.5–35.7)
MCV: 95 fL (ref 79–97)
Monocytes Absolute: 0.5 10*3/uL (ref 0.1–0.9)
Monocytes: 7 %
Neutrophils Absolute: 3.9 10*3/uL (ref 1.4–7.0)
Neutrophils: 57 %
Platelets: 201 10*3/uL (ref 150–450)
RBC: 4.29 x10E6/uL (ref 3.77–5.28)
RDW: 12.2 % (ref 11.7–15.4)
Retic Ct Pct: 0.9 % (ref 0.6–2.6)
Total Iron Binding Capacity: 336 ug/dL (ref 250–450)
UIBC: 234 ug/dL (ref 131–425)
Vitamin B-12: 522 pg/mL (ref 232–1245)
WBC: 6.8 10*3/uL (ref 3.4–10.8)

## 2020-11-06 LAB — VITAMIN D 25 HYDROXY (VIT D DEFICIENCY, FRACTURES): Vit D, 25-Hydroxy: 12.6 ng/mL — ABNORMAL LOW (ref 30.0–100.0)

## 2020-11-06 LAB — TSH: TSH: 1.84 u[IU]/mL (ref 0.450–4.500)

## 2020-11-06 LAB — T4, FREE: Free T4: 1.18 ng/dL (ref 0.82–1.77)

## 2020-11-06 MED ORDER — CHOLECALCIFEROL 1.25 MG (50000 UT) PO TABS: 1.0000 | ORAL_TABLET | ORAL | 0 refills | Status: DC

## 2020-11-06 NOTE — Addendum Note (Signed)
Addended by: Larae Grooms on: 11/06/2020 09:04 AM   Modules accepted: Orders

## 2020-11-08 ENCOUNTER — Other Ambulatory Visit: Payer: Self-pay | Admitting: Nurse Practitioner

## 2020-11-08 NOTE — Assessment & Plan Note (Signed)
Patient was previously on supplements for anemia post surgery but has not been on them since anemia resolved.  Lab work drawn today.

## 2020-11-12 ENCOUNTER — Inpatient Hospital Stay: Payer: No Typology Code available for payment source | Attending: Oncology

## 2020-11-12 ENCOUNTER — Ambulatory Visit: Payer: Self-pay | Admitting: Cardiovascular Disease

## 2020-11-12 ENCOUNTER — Encounter: Payer: Self-pay | Admitting: Oncology

## 2020-11-12 ENCOUNTER — Inpatient Hospital Stay (HOSPITAL_BASED_OUTPATIENT_CLINIC_OR_DEPARTMENT_OTHER): Payer: No Typology Code available for payment source | Admitting: Oncology

## 2020-11-12 DIAGNOSIS — R5383 Other fatigue: Secondary | ICD-10-CM

## 2020-11-12 DIAGNOSIS — D509 Iron deficiency anemia, unspecified: Secondary | ICD-10-CM | POA: Diagnosis not present

## 2020-11-12 DIAGNOSIS — Z9884 Bariatric surgery status: Secondary | ICD-10-CM | POA: Diagnosis not present

## 2020-11-12 DIAGNOSIS — E559 Vitamin D deficiency, unspecified: Secondary | ICD-10-CM

## 2020-11-12 NOTE — Progress Notes (Signed)
HEMATOLOGY-ONCOLOGY TeleHEALTH VISIT PROGRESS NOTE  I connected with Belinda [name] on 11/12/20 at  2:00 PM EST by video enabled telemedicine visit and verified that I am speaking with the correct person using two identifiers. I discussed the limitations, risks, security and privacy concerns of performing an evaluation and management service by telemedicine and the availability of in-person appointments. I also discussed with the patient that there may be a patient responsible charge related to this service. The patient expressed understanding and agreed to proceed.   Other persons participating in the visit and their role in the encounter:  None  Patient's location: Home  Provider's location: office Chief Complaint: Follow-up for iron deficiency anemia, gastric bypass.   INTERVAL HISTORY Belinda [name] is a 45 y.o. female who has above history reviewed by me today presents for follow up visit for management of iron deficiency anemia and gastric bypass. Problems and complaints are listed below:  Patient reports feeling tired and fatigued, it is chronic but worse for the past few weeks.  Reports sleeping disturbance, she wakes up during the night and has difficulties going back to sleep.  She gives her self vitamin B12 injections once a week. She has had blood work done by primary care provider recently.  Vitamin D level was low and she was started on vitamin D supplementation.  Appetite is fair.  She denies any thoughts of hurting herself.  Review of Systems  Constitutional: Negative for appetite change, chills, fatigue, fever and unexpected weight change.  HENT:   Negative for hearing loss and voice change.   Eyes: Negative for eye problems.  Respiratory: Negative for chest tightness and cough.   Cardiovascular: Negative for chest pain.  Gastrointestinal: Negative for abdominal distention, abdominal pain, blood in stool and nausea.  Endocrine: Negative for hot flashes.  Genitourinary:  Negative for difficulty urinating and frequency.   Musculoskeletal: Negative for arthralgias.  Skin: Negative for itching and rash.  Neurological: Negative for extremity weakness.  Hematological: Negative for adenopathy.  Psychiatric/Behavioral: Positive for sleep disturbance. Negative for confusion.      Past Medical History:  Diagnosis Date  . Arthritis    lower back  . Bipolar affective disorder (HCC)   . Controlled substance agreement signed 10/16/2017  . Deviated septum   . Diabetes mellitus without complication (HCC)   . GERD (gastroesophageal reflux disease)   . Headache(784.0)    migraines - none 2 months  . Peptic ulcer   . Pre-diabetes   . Schizophrenia (HCC)   . Sleep apnea   . Substance abuse [facility])    Past Surgical History:  Procedure Laterality Date  . ESOPHAGOGASTRODUODENOSCOPY  08/05/2012   Procedure: ESOPHAGOGASTRODUODENOSCOPY (EGD);  Surgeon: Kandis Cocking, MD;  Location: [facility];  Service: General;  Laterality: N/A;  . ESOPHAGOGASTRODUODENOSCOPY (EGD) WITH PROPOFOL N/A 08/06/2016   Procedure: ESOPHAGOGASTRODUODENOSCOPY (EGD) WITH PROPOFOL;  Surgeon: Wyline Mood, MD;  [facility];  Service: Endoscopy;  Laterality: N/A;  . ESOPHAGOGASTRODUODENOSCOPY (EGD) WITH PROPOFOL N/A 11/07/2016   Procedure: ESOPHAGOGASTRODUODENOSCOPY (EGD) WITH PROPOFOL;  Surgeon: Wyline Mood, MD;  Location: [facility];  Service: Endoscopy;  Laterality: N/A;  . 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/2011   Procedure: LAPAROSCOPIC ROUX-EN-Y GASTRIC;  Surgeon: Mariella Saa, MD;  Location: [facility];  Service: General;  Laterality: N/A;  upper endoscopy  . SEPTOPLASTY  2003    Family History  Problem Relation Age of Onset  . Diabetes Mother   . Cancer Mother 86  brain, skin  . Cancer Father 34       kidney, bone, rectal  . Hyperlipidemia Father   . Hypertension Father   . Stroke Father   . Cancer Paternal Aunt        breast  . Diabetes Brother   . Allergies Sister      Social History   Socioeconomic History  . Marital status: Married    Spouse name: Not on file  . Number of children: Not on file  . Years of education: Not on file  . Highest education level: Not on file  Occupational History  . Occupation: unemployed  Tobacco Use  . Smoking status: Former Smoker    Quit date: 04/03/2017    Years since quitting: 3.6  . Smokeless tobacco: Never Used  . Tobacco comment: only smoked for about 47months   Vaping Use  . Vaping Use: Never used  Substance and Sexual Activity  . Alcohol use: Yes    Alcohol/week: 3.0 standard drinks    Types: 3 Standard drinks or equivalent per week    Comment: a few times a week  . Drug use: Not Currently    Types: Marijuana  . Sexual activity: Yes    Birth control/protection: None  Other Topics Concern  . Not on file  Social History Narrative  . Not on file   Social Determinants of Health   Financial Resource Strain: Not on file  Food Insecurity: Not on file  Transportation Needs: Not on file  Physical Activity: Not on file  Stress: Not on file  Social Connections: Not on file  Intimate Partner Violence: Not on file    Current Outpatient Medications on File Prior to Visit  Medication Sig Dispense Refill  . B-D 3CC LUER-LOK SYR 25GX1" 25G X 1" 3 ML MISC     . Cholecalciferol 1.25 MG (50000 UT) TABS Take 1 tablet by mouth once a week. For 8 weeks and then stop.  Return to office for lab draw. 8 tablet 0  . cyanocobalamin (,VITAMIN B-12,) 1000 MCG/ML injection INJECT 1 ML (1,000 MCG TOTAL) INTO THE MUSCLE ONCE A WEEK. 12 mL 4  . lidocaine (LIDODERM) 5 % Place 1 patch onto the skin daily. Remove & Discard patch within 12 hours or as directed by MD 30 patch 0  . ondansetron (ZOFRAN) 4 MG tablet TAKE 1 TABLET (4 MG TOTAL) BY MOUTH EVERY 8 (EIGHT) HOURS AS NEEDED. FOR NAUSEA 20 tablet 5  . Probiotic Product ([employer] MOOD SUPPORT PROBIOTIC PO) Take by mouth.    . EPINEPHrine 0.3 mg/0.3 mL IJ SOAJ injection Inject 0.3  mg into the muscle as needed for anaphylaxis. (Patient not taking: Reported on 11/12/2020) 1 each 2   No current facility-administered medications on file prior to visit.    Allergies  Allergen Reactions  . Acetaminophen Nausea Only  . Valproic Acid Other (See Comments)    Tremors  . Depakote [Divalproex Sodium] Other (See Comments)    Tremors       Observations/Objective: There were no vitals filed for this visit. There is no height or weight on file to calculate BMI.  Physical Exam Constitutional:      General: She is not in acute distress. Neurological:     Mental Status: She is alert.  Psychiatric:        Mood and Affect: Mood normal.     CBC    Component Value Date/Time   WBC 6.8 11/05/2020 0835   WBC 7.1 11/14/2019  1037   RBC 4.29 11/05/2020 0835   RBC 4.41 11/14/2019 1037   HGB 13.5 11/05/2020 0835   HCT 40.9 11/05/2020 0835   PLT 201 11/05/2020 0835   MCV 95 11/05/2020 0835   MCH 31.5 11/05/2020 0835   MCH 30.4 11/14/2019 1037   MCHC 33.0 11/05/2020 0835   MCHC 32.0 11/14/2019 1037   RDW 12.2 11/05/2020 0835   LYMPHSABS 2.3 11/05/2020 0835   MONOABS 0.4 11/14/2019 1037   EOSABS 0.1 11/05/2020 0835   BASOSABS 0.1 11/05/2020 0835    CMP     Component Value Date/Time   NA 140 11/05/2020 0835   K 4.6 11/05/2020 0835   CL 105 11/05/2020 0835   CO2 22 11/05/2020 0835   GLUCOSE 91 11/05/2020 0835   GLUCOSE 96 05/13/2019 1333   BUN 8 11/05/2020 0835   CREATININE 0.74 11/05/2020 0835   CREATININE 0.88 11/14/2011 1728   CALCIUM 9.0 11/05/2020 0835   PROT 6.2 11/05/2020 0835   ALBUMIN 4.2 11/05/2020 0835   AST 13 11/05/2020 0835   ALT 15 11/05/2020 0835   ALKPHOS 51 11/05/2020 0835   BILITOT 0.3 11/05/2020 0835   GFRNONAA 99 11/05/2020 0835   GFRAA 114 11/05/2020 0835     Assessment and Plan: 1. Iron deficiency anemia, unspecified iron deficiency anemia type   2. History of gastric bypass   3. Vitamin D deficiency   4. Fatigue, unspecified type      History of iron deficiency in the context of history of gastric bypass. Labs are reviewed and discussed with patient.  Hemoglobin and iron panel Have both be normal and stable.  I will hold off Venofer treatment at this point. Recommend patient to continue vitamin B12 injections..  Fatigue.  Discussed with her that the fatigue and tiredness may be not due to anemia given the normal hemoglobin and iron panel.  TSH is normal.  Her sleep disturbance may contribute to her fatigue level.  Patient has history of bipolar disease  and underlying depression may be an etiology to.  Recommend patient to further discuss with primary care provider and she agrees with the plan.  Vitamin D deficiency, continue vitamin D supplementation, managed by primary care provider.  Follow Up Instructions: 6 months   I discussed the assessment and treatment plan with the patient. The patient was provided an opportunity to ask questions and all were answered. The patient agreed with the plan and demonstrated an understanding of the instructions.  The patient was advised to call back or seek an in-person evaluation if the symptoms worsen or if the condition fails to improve as anticipated.   Rickard Patience, MD 11/12/2020 3:39 PM

## 2020-11-12 NOTE — Progress Notes (Signed)
Patient contacted for Mychart visit. Pt stated on Vitamin D last week and energy levels continue to be low.

## 2020-11-13 ENCOUNTER — Inpatient Hospital Stay: Payer: No Typology Code available for payment source

## 2020-12-03 ENCOUNTER — Ambulatory Visit: Payer: No Typology Code available for payment source | Admitting: Family Medicine

## 2020-12-16 NOTE — Progress Notes (Unsigned)
Cardiology Office Note  Date:  12/17/2020   ID:  Belinda [name], DOB 10/08/1975, MRN [id number]  PCP:  Dorcas Carrow, DO   Chief Complaint  Patient presents with  . NEW patient-evaluation of family hx of congenital anomaly    HPI:  [name] is a pleasant 45 year old woman with past medical history of COVID-19 infection September 2021 Iron deficiency anemia Fatigue Bariatric surgery, 90 pounds down Remote smoking Referred by Olevia Perches for consultation of her family history of congenital anomaly  Notes indicating possible birth defects, family history of valvular disease No prior echocardiogram available  Brother with bicuspid valve and dilated aorta Had surgery  Also, uncle with possible congenital valve disease  Active, no shortness of breath on exertion, no significant chest pain Denies any leg swelling, no PND orthopnea  HGB 13.5 Low vit D, on script  covid 19 x 2, 06/2020  EKG personally reviewed by myself on todays visit Shows normal sinus rhythm rate 61 bpm no significant ST or T wave changes   PMH:   has a past medical history of Arthritis, Bipolar affective disorder (HCC), Controlled substance agreement signed (10/16/2017), Deviated septum, Diabetes mellitus without complication (HCC), GERD (gastroesophageal reflux disease), Headache(784.0), Peptic ulcer, Pre-diabetes, Schizophrenia (HCC), Sleep apnea, and Substance abuse (HCC).  PSH:    Past Surgical History:  Procedure Laterality Date  . ESOPHAGOGASTRODUODENOSCOPY  08/05/2012   Procedure: ESOPHAGOGASTRODUODENOSCOPY (EGD);  Surgeon: Kandis Cocking, MD;  Location: [facility];  Service: General;  Laterality: N/A;  . ESOPHAGOGASTRODUODENOSCOPY (EGD) WITH PROPOFOL N/A 08/06/2016   Procedure: ESOPHAGOGASTRODUODENOSCOPY (EGD) WITH PROPOFOL;  Surgeon: Wyline Mood, MD;  Location: [facility];  Service: Endoscopy;  Laterality: N/A;  . ESOPHAGOGASTRODUODENOSCOPY (EGD) WITH PROPOFOL N/A 11/07/2016    Procedure: ESOPHAGOGASTRODUODENOSCOPY (EGD) WITH PROPOFOL;  Surgeon: Wyline Mood, MD;  Location: [facility];  Service: Endoscopy;  Laterality: N/A;  . 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/2011   Procedure: LAPAROSCOPIC ROUX-EN-Y GASTRIC;  Surgeon: Mariella Saa, MD;  Location: [facility];  Service: General;  Laterality: N/A;  upper endoscopy  . SEPTOPLASTY  2003    Current Outpatient Medications  Medication Sig Dispense Refill  . B-D 3CC LUER-LOK SYR 25GX1" 25G X 1" 3 ML MISC     . Cholecalciferol 1.25 MG (50000 UT) TABS Take 1 tablet by mouth once a week. For 8 weeks and then stop.  Return to office for lab draw. 8 tablet 0  . cyanocobalamin (,VITAMIN B-12,) 1000 MCG/ML injection INJECT 1 ML (1,000 MCG TOTAL) INTO THE MUSCLE ONCE A WEEK. 12 mL 4  . EPINEPHrine 0.3 mg/0.3 mL IJ SOAJ injection Inject 0.3 mg into the muscle as needed for anaphylaxis. 1 each 2  . lidocaine (LIDODERM) 5 % Place 1 patch onto the skin daily. Remove & Discard patch within 12 hours or as directed by MD 30 patch 0  . ondansetron (ZOFRAN) 4 MG tablet TAKE 1 TABLET (4 MG TOTAL) BY MOUTH EVERY 8 (EIGHT) HOURS AS NEEDED. FOR NAUSEA 20 tablet 5  . Probiotic Product ([employer] MOOD SUPPORT PROBIOTIC PO) Take by mouth daily.     No current facility-administered medications for this visit.     Allergies:   Acetaminophen, Valproic acid, and Depakote [divalproex sodium]   Social History:  The patient  reports that she quit smoking about 3 years ago. She has never used smokeless tobacco. She reports current alcohol use of about 3.0 standard drinks of alcohol per week. She reports previous drug use. Drug: Marijuana.  Family History:   family history includes Allergies in her sister; Cancer in her paternal aunt; Cancer (age of onset: 47) in her father; Cancer (age of onset: 35) in her mother; Congenital heart disease in her brother; Diabetes in her brother and mother; Hyperlipidemia in her father; Hypertension in her father; Lupus in her  sister; Stroke in her father.    Review of Systems: Review of Systems  Constitutional: Negative.   HENT: Negative.   Respiratory: Negative.   Cardiovascular: Negative.   Gastrointestinal: Negative.   Musculoskeletal: Negative.   Neurological: Negative.   Psychiatric/Behavioral: Negative.   All other systems reviewed and are negative.   PHYSICAL EXAM: VS:  BP 120/70 (BP Location: Right Arm, Patient Position: Sitting, Cuff Size: Normal)   Pulse 61   Ht 5\' 2"  (1.575 m)   Wt 169 lb (76.7 kg)   SpO2 98%   BMI 30.91 kg/m  , BMI Body mass index is 30.91 kg/m. GEN: Well nourished, well developed, in no acute distress HEENT: normal Neck: no JVD, carotid bruits, or masses Cardiac: RRR; no murmurs, rubs, or gallops,no edema  Respiratory:  clear to auscultation bilaterally, normal work of breathing GI: soft, nontender, nondistended, + BS MS: no deformity or atrophy Skin: warm and dry, no rash Neuro:  Strength and sensation are intact Psych: euthymic mood, full affect   Recent Labs: 11/05/2020: ALT 15; BUN 8; Creatinine, Ser 0.74; Hemoglobin 13.5; Platelets 201; Potassium 4.6; Sodium 140; TSH 1.840    Lipid Panel Lab Results  Component Value Date   CHOL 173 04/30/2020   HDL 72 04/30/2020   LDLCALC 86 04/30/2020   TRIG 83 04/30/2020      Wt Readings from Last 3 Encounters:  12/17/20 169 lb (76.7 kg)  11/05/20 173 lb 6.4 oz (78.7 kg)  10/01/20 170 lb 6.4 oz (77.3 kg)      ASSESSMENT AND PLAN:  Problem List Items Addressed This Visit   None   Visit Diagnoses    Family history of aortic valve disorder    -  Primary   Relevant Orders   EKG 12-Lead   ECHOCARDIOGRAM COMPLETE   Family history of complex congenital heart disease       Relevant Orders   EKG 12-Lead   ECHOCARDIOGRAM COMPLETE      Family history congenital heart disease By her account, brother had bicuspid aortic valve with dilated aortic root requiring surgery She has no murmur on exam, normal  EKG Out of concern she would like echocardiogram to confirm no congenital heart disease, Order for echo placed  Gastric bypass surgery, weight loss Lost 90 pounds, doing very well Reports improvement in blood pressure  Other cardiac risk factors Weight low, cholesterol well controlled, blood pressure well controlled, non-smoker, no diabetes   Total encounter time more than 45 minutes  Greater than 50% was spent in counseling and coordination of care with the patient    Signed, 10/03/20, M.D., Ph.D. [facility] [place], [place]

## 2020-12-17 ENCOUNTER — Other Ambulatory Visit: Payer: Self-pay

## 2020-12-17 ENCOUNTER — Ambulatory Visit: Payer: No Typology Code available for payment source | Admitting: Cardiovascular Disease

## 2020-12-17 ENCOUNTER — Encounter: Payer: Self-pay | Admitting: Cardiovascular Disease

## 2020-12-17 VITALS — BP 120/70 | HR 61 | Ht 62.0 in | Wt 169.0 lb

## 2020-12-17 DIAGNOSIS — Z8249 Family history of ischemic heart disease and other diseases of the circulatory system: Secondary | ICD-10-CM | POA: Diagnosis not present

## 2020-12-17 DIAGNOSIS — Z9884 Bariatric surgery status: Secondary | ICD-10-CM

## 2020-12-17 NOTE — Patient Instructions (Addendum)
Medication Instructions:  No changes  Lab work: No new labs needed  Testing/Procedures:   WE WILL CALL WITH RESULTS, YOU WILL SEE IN YOUR MYCHART AS WELL, BUT WE WILL CALL  Echo (family hx of aortic valve disease, family hx dilated ascending aorta) Your physician has requested that you have an echocardiogram. Echocardiography is a painless test that uses sound waves to create images of your heart. It provides your doctor with information about the size and shape of your heart and how well your heart's chambers and valves are working. This procedure takes approximately one hour. There are no restrictions for this procedure.  There is a possibility that an IV may need to be started during your test to inject an image enhancing agent. This is done to obtain more optimal pictures of your heart. Therefore we ask that you do at least drink some water prior to coming in to hydrate your veins.     Follow-Up: At [facility], you and your health needs are our priority.  As part of our continuing mission to provide you with exceptional heart care, we have created designated Provider Care Teams.  These Care Teams include your primary Cardiologist (physician) and Advanced Practice Providers (APPs -  Physician Assistants and Nurse Practitioners) who all work together to provide you with the care you need, when you need it.  . You will need a follow up appointment as needed  . Providers on your designated Care Team:   . Nicolasa Ducking, NP . Eula Listen, PA-C . Marisue Ivan, PA-C  Any Other Special Instructions Will Be Listed Below (If Applicable).  COVID-19 Vaccine Information can be found at: [email or web address] For questions related to vaccine distribution or appointments, please email [email or web address] or call [phone].

## 2020-12-25 ENCOUNTER — Other Ambulatory Visit: Payer: Self-pay | Admitting: Nurse Practitioner

## 2020-12-25 DIAGNOSIS — E559 Vitamin D deficiency, unspecified: Secondary | ICD-10-CM

## 2020-12-25 NOTE — Telephone Encounter (Signed)
Routing to provider  

## 2020-12-25 NOTE — Telephone Encounter (Signed)
Requested medications are due for refill today yes  Requested medications are on the active medication list yes  Last refill 2/1  Last visit 11/05/20  Future visit scheduled No, OV note states to return in 8 weeks for labs  Notes to clinic Not Delegated.

## 2021-01-03 ENCOUNTER — Other Ambulatory Visit: Payer: Self-pay

## 2021-01-03 ENCOUNTER — Ambulatory Visit (INDEPENDENT_AMBULATORY_CARE_PROVIDER_SITE_OTHER): Payer: No Typology Code available for payment source

## 2021-01-03 DIAGNOSIS — Z8249 Family history of ischemic heart disease and other diseases of the circulatory system: Secondary | ICD-10-CM | POA: Diagnosis not present

## 2021-01-03 LAB — ECHOCARDIOGRAM COMPLETE
AR max vel: 2.74 cm2
AV Area VTI: 3.09 cm2
AV Area mean vel: 2.94 cm2
AV Mean grad: 3 mmHg
AV Peak grad: 5.5 mmHg
Ao pk vel: 1.17 m/s
Area-P 1/2: 3.83 cm2
Calc EF: 55.8 %
S' Lateral: 3.4 cm
Single Plane A2C EF: 58.9 %
Single Plane A4C EF: 53.8 %

## 2021-01-08 ENCOUNTER — Other Ambulatory Visit: Payer: Self-pay

## 2021-01-08 ENCOUNTER — Encounter: Payer: Self-pay | Admitting: Family Medicine

## 2021-01-08 ENCOUNTER — Telehealth (INDEPENDENT_AMBULATORY_CARE_PROVIDER_SITE_OTHER): Payer: No Typology Code available for payment source | Admitting: Family Medicine

## 2021-01-08 DIAGNOSIS — Z538 Procedure and treatment not carried out for other reasons: Secondary | ICD-10-CM

## 2021-01-08 DIAGNOSIS — E559 Vitamin D deficiency, unspecified: Secondary | ICD-10-CM

## 2021-01-08 NOTE — Progress Notes (Signed)
Patient only needed refill on vitamin D. Will come in for lab check and follow up with physical in December. Appointment cancelled today.

## 2021-01-09 NOTE — Progress Notes (Signed)
Pt stated she would call to make this apt.  

## 2021-01-10 ENCOUNTER — Telehealth: Payer: Self-pay

## 2021-01-10 ENCOUNTER — Other Ambulatory Visit: Payer: No Typology Code available for payment source

## 2021-01-10 ENCOUNTER — Other Ambulatory Visit: Payer: Self-pay

## 2021-01-10 DIAGNOSIS — E559 Vitamin D deficiency, unspecified: Secondary | ICD-10-CM

## 2021-01-10 NOTE — Telephone Encounter (Signed)
Was unable to reach pt via phone regarding her recent echo results, LDM on VM (DPR approved) that Dr. Mariah Milling had a chance to review her results and advised   "Echo  Appears relatively normal  No aortic valve disorder/disease noted, no dilated aorta recorded"  Advised she can also review results on her MyChart, recommended to call the office with any concerns or questions.

## 2021-01-11 LAB — VITAMIN D 25 HYDROXY (VIT D DEFICIENCY, FRACTURES): Vit D, 25-Hydroxy: 51.7 ng/mL (ref 30.0–100.0)

## 2021-05-02 ENCOUNTER — Encounter: Payer: Self-pay | Admitting: Family Medicine

## 2021-05-02 ENCOUNTER — Other Ambulatory Visit: Payer: Self-pay

## 2021-05-02 ENCOUNTER — Ambulatory Visit (INDEPENDENT_AMBULATORY_CARE_PROVIDER_SITE_OTHER): Payer: No Typology Code available for payment source | Admitting: Family Medicine

## 2021-05-02 VITALS — BP 93/64 | HR 62 | Temp 98.1°F | Ht 62.5 in | Wt 153.2 lb

## 2021-05-02 DIAGNOSIS — D509 Iron deficiency anemia, unspecified: Secondary | ICD-10-CM

## 2021-05-02 DIAGNOSIS — Z1211 Encounter for screening for malignant neoplasm of colon: Secondary | ICD-10-CM | POA: Diagnosis not present

## 2021-05-02 DIAGNOSIS — Z9884 Bariatric surgery status: Secondary | ICD-10-CM | POA: Diagnosis not present

## 2021-05-02 DIAGNOSIS — Z0000 Encounter for general adult medical examination without abnormal findings: Secondary | ICD-10-CM

## 2021-05-02 DIAGNOSIS — E559 Vitamin D deficiency, unspecified: Secondary | ICD-10-CM

## 2021-05-02 DIAGNOSIS — G4700 Insomnia, unspecified: Secondary | ICD-10-CM | POA: Diagnosis not present

## 2021-05-02 DIAGNOSIS — Z1231 Encounter for screening mammogram for malignant neoplasm of breast: Secondary | ICD-10-CM

## 2021-05-02 DIAGNOSIS — F319 Bipolar disorder, unspecified: Secondary | ICD-10-CM

## 2021-05-02 LAB — URINALYSIS, ROUTINE W REFLEX MICROSCOPIC
Bilirubin, UA: NEGATIVE
Glucose, UA: NEGATIVE
Ketones, UA: NEGATIVE
Leukocytes,UA: NEGATIVE
Nitrite, UA: NEGATIVE
Protein,UA: NEGATIVE
RBC, UA: NEGATIVE
Specific Gravity, UA: 1.025 (ref 1.005–1.030)
Urobilinogen, Ur: 0.2 mg/dL (ref 0.2–1.0)
pH, UA: 7 (ref 5.0–7.5)

## 2021-05-02 LAB — BAYER DCA HB A1C WAIVED: HB A1C (BAYER DCA - WAIVED): 5.3 % (ref <7.0)

## 2021-05-02 MED ORDER — CYANOCOBALAMIN 1000 MCG/ML IJ SOLN
1000.0000 ug | INTRAMUSCULAR | 4 refills | Status: DC
Start: 2021-05-02 — End: 2023-12-21

## 2021-05-02 MED ORDER — TRAZODONE HCL 50 MG PO TABS: 25.0000 mg | ORAL_TABLET | Freq: Every evening | ORAL | 3 refills | Status: DC | PRN

## 2021-05-02 MED ORDER — ONDANSETRON HCL 4 MG PO TABS
4.0000 mg | ORAL_TABLET | Freq: Three times a day (TID) | ORAL | 5 refills | Status: DC | PRN
Start: 2021-05-02 — End: 2022-02-25

## 2021-05-02 NOTE — Assessment & Plan Note (Signed)
Rechecking labs today. Await results. Follow up with hematology as needed.

## 2021-05-02 NOTE — Assessment & Plan Note (Signed)
Doing OK Off medicine. Continue to monitor. Call with any concerns.

## 2021-05-02 NOTE — Progress Notes (Signed)
BP 93/64   Pulse 62   Temp 98.1 F (36.7 C) (Oral)   Ht 5' 2.5" (1.588 m)   Wt 153 lb 3.2 oz (69.5 kg)   LMP 04/26/2021 (Exact Date)   SpO2 100%   BMI 27.57 kg/m    Subjective:    Patient ID: Belinda [name], female    DOB: 1976/05/20, 45 y.o.   MRN: [id number]  HPI: Belinda [name] is a 45 y.o. female presenting on 05/02/2021 for comprehensive medical examination. Current medical complaints include:  INSOMNIA Duration: past couple of months Satisfied with sleep quality: no Difficulty falling asleep: no Difficulty staying asleep: yes Waking a few hours after sleep onset: yes Early morning awakenings: yes Daytime hypersomnolence: no Wakes feeling refreshed: no Good sleep hygiene: yes Apnea: no Snoring: no Depressed/anxious mood: yes Recent stress: no Restless legs/nocturnal leg cramps: no Chronic pain/arthritis: no History of sleep study: no Treatments attempted: melatonin, uinsom, benadryl, and ambien   ANEMIA Anemia status: better Etiology of anemia: iron def Duration of anemia treatment: chronic  Compliance with treatment: excellent compliance Iron supplementation side effects: no Severity of anemia: mild Fatigue: yes Decreased exercise tolerance: no  Dyspnea on exertion: no Palpitations: no Bleeding: no Pica: no  She currently lives with: husband Menopausal Symptoms: no  Depression Screen done today and results listed below:  Depression screen [facility] 2/9 05/02/2021 10/17/2020 06/01/2020 04/30/2020 04/23/2020  Decreased Interest 0 0 0 0 0  Down, Depressed, Hopeless 0 0 0 0 0  PHQ - 2 Score 0 0 0 0 0  Altered sleeping - - 1 1 1   Tired, decreased energy - - 1 1 1   Change in appetite - - 0 0 1  Feeling bad or failure about yourself  - - 0 0 1  Trouble concentrating - - 0 0 0  Moving slowly or fidgety/restless - - 0 0 0  Suicidal thoughts - - 0 0 0  PHQ-9 Score - - 2 2 4   Difficult doing work/chores - - Not difficult at all Not difficult at all Somewhat  difficult  Some recent data might be hidden    Past Medical History:  Past Medical History:  Diagnosis Date   Arthritis    lower back   Bipolar affective disorder (HCC)    Controlled substance agreement signed 10/16/2017   Deviated septum    Diabetes mellitus without complication (HCC)    GERD (gastroesophageal reflux disease)    Headache(784.0)    migraines - none 2 months   Peptic ulcer    Pre-diabetes    Schizophrenia (HCC)    Sleep apnea    Substance abuse [facility])     Surgical History:  Past Surgical History:  Procedure Laterality Date   ESOPHAGOGASTRODUODENOSCOPY  08/05/2012   Procedure: ESOPHAGOGASTRODUODENOSCOPY (EGD);  Surgeon: 12/14/2017, MD;  Location: [facility];  Service: General;  Laterality: N/A;   ESOPHAGOGASTRODUODENOSCOPY (EGD) WITH PROPOFOL N/A 08/06/2016   Procedure: ESOPHAGOGASTRODUODENOSCOPY (EGD) WITH PROPOFOL;  Surgeon: Kandis Cocking, MD;  Location: [facility];  Service: Endoscopy;  Laterality: N/A;   ESOPHAGOGASTRODUODENOSCOPY (EGD) WITH PROPOFOL N/A 11/07/2016   Procedure: ESOPHAGOGASTRODUODENOSCOPY (EGD) WITH PROPOFOL;  Surgeon: Wyline Mood, MD;  Location: [facility] ENDOSCOPY;  Service: Endoscopy;  Laterality: N/A;   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/2011   Procedure: LAPAROSCOPIC ROUX-EN-Y GASTRIC;  Surgeon: 01/05/2017, MD;  Location: [facility] ORS;  Service: General;  Laterality: N/A;  upper endoscopy   SEPTOPLASTY  2003    Medications:  Current Outpatient Medications on  File Prior to Visit  Medication Sig   B-D 3CC LUER-LOK SYR 25GX1" 25G X 1" 3 ML MISC    lidocaine (LIDODERM) 5 % Place 1 patch onto the skin daily. Remove & Discard patch within 12 hours or as directed by MD   Probiotic Product ([employer] MOOD SUPPORT PROBIOTIC PO) Take by mouth daily.   No current facility-administered medications on file prior to visit.    Allergies:  Allergies  Allergen Reactions   Acetaminophen Nausea Only   Valproic Acid Other (See Comments)    Tremors    Depakote [Divalproex Sodium] Other (See Comments)    Tremors    Social History:  Social History   Socioeconomic History   Marital status: Married    Spouse name: Not on file   Number of children: Not on file   Years of education: Not on file   Highest education level: Not on file  Occupational History   Occupation: unemployed  Tobacco Use   Smoking status: Former    Types: Cigarettes    Quit date: 04/03/2017    Years since quitting: 4.0   Smokeless tobacco: Never   Tobacco comments:    only smoked for about 6months   Vaping Use   Vaping Use: Never used  Substance and Sexual Activity   Alcohol use: Yes    Alcohol/week: 3.0 standard drinks    Types: 3 Standard drinks or equivalent per week    Comment: a few times a week   Drug use: Not Currently    Types: Marijuana   Sexual activity: Yes    Birth control/protection: None  Other Topics Concern   Not on file  Social History Narrative   Not on file   Social Determinants of Health   Financial Resource Strain: Not on file  Food Insecurity: Not on file  Transportation Needs: Not on file  Physical Activity: Not on file  Stress: Not on file  Social Connections: Not on file  Intimate Partner Violence: Not on file   Social History   Tobacco Use  Smoking Status Former   Types: Cigarettes   Quit date: 04/03/2017   Years since quitting: 4.0  Smokeless Tobacco Never  Tobacco Comments   only smoked for about 6months    Social History   Substance and Sexual Activity  Alcohol Use Yes   Alcohol/week: 3.0 standard drinks   Types: 3 Standard drinks or equivalent per week   Comment: a few times a week    Family History:  Family History  Problem Relation Age of Onset   Diabetes Mother    Cancer Mother [id number]       brain, skin   Cancer Father [id number]       kidney, bone, rectal   Hyperlipidemia Father    Hypertension Father    Stroke Father    Cancer Paternal Aunt        breast   Diabetes Brother    Congenital heart  disease Brother    Allergies Sister    Lupus Sister     Past medical history, surgical history, medications, allergies, family history and social history reviewed with patient today and changes made to appropriate areas of the chart.   Review of Systems  Constitutional: Negative.   HENT: Negative.    Eyes: Negative.   Respiratory: Negative.    Cardiovascular: Negative.   Gastrointestinal:  Positive for abdominal pain and nausea. Negative for blood in stool, constipation, diarrhea, heartburn, melena and vomiting.  Genitourinary: Negative.  Musculoskeletal: Negative.   Skin: Negative.   Neurological: Negative.   Endo/Heme/Allergies: Negative.   Psychiatric/Behavioral:  Negative for depression, hallucinations, memory loss, substance abuse and suicidal ideas. The patient is nervous/anxious and has insomnia.   All other ROS negative except what is listed above and in the HPI.      Objective:    BP 93/64   Pulse 62   Temp 98.1 F (36.7 C) (Oral)   Ht 5' 2.5" (1.588 m)   Wt 153 lb 3.2 oz (69.5 kg)   LMP 04/26/2021 (Exact Date)   SpO2 100%   BMI 27.57 kg/m   Wt Readings from Last 3 Encounters:  05/02/21 153 lb 3.2 oz (69.5 kg)  12/17/20 169 lb (76.7 kg)  11/05/20 173 lb 6.4 oz (78.7 kg)    Physical Exam Vitals and nursing note reviewed.  Constitutional:      General: She is not in acute distress.    Appearance: Normal appearance. She is not ill-appearing, toxic-appearing or diaphoretic.  HENT:     Head: Normocephalic and atraumatic.     Right Ear: Tympanic membrane, ear canal and external ear normal. There is no impacted cerumen.     Left Ear: Tympanic membrane, ear canal and external ear normal. There is no impacted cerumen.     Nose: Nose normal. No congestion or rhinorrhea.     Mouth/Throat:     Mouth: Mucous membranes are moist.     Pharynx: Oropharynx is clear. No oropharyngeal exudate or posterior oropharyngeal erythema.  Eyes:     General: No scleral icterus.        Right eye: No discharge.        Left eye: No discharge.     Extraocular Movements: Extraocular movements intact.     Conjunctiva/sclera: Conjunctivae normal.     Pupils: Pupils are equal, round, and reactive to light.  Neck:     Vascular: No carotid bruit.  Cardiovascular:     Rate and Rhythm: Normal rate and regular rhythm.     Pulses: Normal pulses.     Heart sounds: No murmur heard.   No friction rub. No gallop.  Pulmonary:     Effort: Pulmonary effort is normal. No respiratory distress.     Breath sounds: Normal breath sounds. No stridor. No wheezing, rhonchi or rales.  Chest:     Chest wall: No tenderness.  Abdominal:     General: Abdomen is flat. Bowel sounds are normal. There is no distension.     Palpations: Abdomen is soft. There is no mass.     Tenderness: There is no abdominal tenderness. There is no right CVA tenderness, left CVA tenderness, guarding or rebound.     Hernia: No hernia is present.  Genitourinary:    Comments: Breast and pelvic exams deferred with shared decision making Musculoskeletal:        General: No swelling, tenderness, deformity or signs of injury.     Cervical back: Normal range of motion and neck supple. No rigidity. No muscular tenderness.     Right lower leg: No edema.     Left lower leg: No edema.  Lymphadenopathy:     Cervical: No cervical adenopathy.  Skin:    General: Skin is warm and dry.     Capillary Refill: Capillary refill takes less than 2 seconds.     Coloration: Skin is not jaundiced or pale.     Findings: No bruising, erythema, lesion or rash.  Neurological:     General:  No focal deficit present.     Mental Status: She is alert and oriented to person, place, and time. Mental status is at baseline.     Cranial Nerves: No cranial nerve deficit.     Sensory: No sensory deficit.     Motor: No weakness.     Coordination: Coordination normal.     Gait: Gait normal.     Deep Tendon Reflexes: Reflexes normal.  Psychiatric:         Mood and Affect: Mood normal.        Behavior: Behavior normal.        Thought Content: Thought content normal.        Judgment: Judgment normal.    Results for orders placed or performed in visit on 01/10/21  VITAMIN D 25 Hydroxy (Vit-D Deficiency, Fractures)  Result Value Ref Range   Vit D, 25-Hydroxy 51.7 30.0 - 100.0 ng/mL      Assessment & Plan:   Problem List Items Addressed This Visit       Other   Bariatric surgery status    Rechecking labs today. Await results. Treat as needed.        Relevant Orders   B12   VITAMIN D 25 Hydroxy (Vit-D Deficiency, Fractures)   Iron and TIBC   Ferritin   Bipolar 1 disorder, depressed (HCC)    Doing OK Off medicine. Continue to monitor. Call with any concerns.        IDA (iron deficiency anemia)    Rechecking labs today. Await results. Follow up with hematology as needed.        Relevant Medications   cyanocobalamin (,VITAMIN B-12,) 1000 MCG/ML injection   Other Visit Diagnoses     Routine general medical examination at a health care facility    -  Primary   Vaccines up to date/declined. Screening labs checked today. Colonoscopy and mammogram ordered today. Continue diet and exercise. Call with any concerns.    Relevant Orders   CBC with Differential/Platelet   Comprehensive metabolic panel   Lipid Panel w/o Chol/HDL Ratio   Urinalysis, Routine w reflex microscopic   TSH   HIV Antibody (routine testing w rflx)   Hepatitis C Antibody   SARS-CoV-2 Semi-Quantitative Total Antibody, Spike   Bayer DCA Hb A1c Waived   Insomnia, unspecified type       Will treat with trazodone. Call with any concerns. Continue to monitor.    Vitamin D deficiency       Rechecking labs today. Await results. Treat as needed.    Relevant Orders   VITAMIN D 25 Hydroxy (Vit-D Deficiency, Fractures)   Screening for colon cancer       Referral to GI made today.    Relevant Orders   Ambulatory referral to Gastroenterology   Encounter for  screening mammogram for malignant neoplasm of breast       Mammogram ordered today.   Relevant Orders   MM 3D SCREEN BREAST BILATERAL        Follow up plan: Return in about 1 year (around 05/02/2022) for physical.   LABORATORY TESTING:  - Pap smear: up to date  IMMUNIZATIONS:   - Tdap: Tetanus vaccination status reviewed: last tetanus booster within 10 years. - Influenza: Postponed to flu season - Pneumovax: Not applicable - Prevnar: Not applicable - COVID: Refused  SCREENING: -Mammogram: Ordered today  - Colonoscopy: Ordered today    PATIENT COUNSELING:   Advised to take 1 mg of folate supplement per day if  capable of pregnancy.   Sexuality: Discussed sexually transmitted diseases, partner selection, use of condoms, avoidance of unintended pregnancy  and contraceptive alternatives.   Advised to avoid cigarette smoking.  I discussed with the patient that most people either abstain from alcohol or drink within safe limits (<=14/week and <=4 drinks/occasion for males, <=7/weeks and <= 3 drinks/occasion for females) and that the risk for alcohol disorders and other health effects rises proportionally with the number of drinks per week and how often a drinker exceeds daily limits.  Discussed cessation/primary prevention of drug use and availability of treatment for abuse.   Diet: Encouraged to adjust caloric intake to maintain  or achieve ideal body weight, to reduce intake of dietary saturated fat and total fat, to limit sodium intake by avoiding high sodium foods and not adding table salt, and to maintain adequate dietary potassium and calcium preferably from fresh fruits, vegetables, and low-fat dairy products.    stressed the importance of regular exercise  Injury prevention: Discussed safety belts, safety helmets, smoke detector, smoking near bedding or upholstery.   Dental health: Discussed importance of regular tooth brushing, flossing, and dental visits.    NEXT  PREVENTATIVE PHYSICAL DUE IN 1 YEAR. Return in about 1 year (around 05/02/2022) for physical.

## 2021-05-02 NOTE — Assessment & Plan Note (Signed)
Rechecking labs today. Await results. Treat as needed.  °

## 2021-05-03 LAB — IRON AND TIBC
Iron Saturation: 23 % (ref 15–55)
Iron: 93 ug/dL (ref 27–159)
Total Iron Binding Capacity: 407 ug/dL (ref 250–450)
UIBC: 314 ug/dL (ref 131–425)

## 2021-05-03 LAB — TSH: TSH: 1.65 u[IU]/mL (ref 0.450–4.500)

## 2021-05-03 LAB — CBC WITH DIFFERENTIAL/PLATELET
Basophils Absolute: 0 10*3/uL (ref 0.0–0.2)
Basos: 1 %
EOS (ABSOLUTE): 0.1 10*3/uL (ref 0.0–0.4)
Eos: 1 %
Hematocrit: 40.5 % (ref 34.0–46.6)
Hemoglobin: 13.5 g/dL (ref 11.1–15.9)
Immature Grans (Abs): 0 10*3/uL (ref 0.0–0.1)
Immature Granulocytes: 0 %
Lymphocytes Absolute: 2 10*3/uL (ref 0.7–3.1)
Lymphs: 27 %
MCH: 32.2 pg (ref 26.6–33.0)
MCHC: 33.3 g/dL (ref 31.5–35.7)
MCV: 97 fL (ref 79–97)
Monocytes Absolute: 0.4 10*3/uL (ref 0.1–0.9)
Monocytes: 6 %
Neutrophils Absolute: 4.8 10*3/uL (ref 1.4–7.0)
Neutrophils: 65 %
Platelets: 217 10*3/uL (ref 150–450)
RBC: 4.19 x10E6/uL (ref 3.77–5.28)
RDW: 12.4 % (ref 11.7–15.4)
WBC: 7.4 10*3/uL (ref 3.4–10.8)

## 2021-05-03 LAB — VITAMIN B12: Vitamin B-12: 649 pg/mL (ref 232–1245)

## 2021-05-03 LAB — HIV ANTIBODY (ROUTINE TESTING W REFLEX): HIV Screen 4th Generation wRfx: NONREACTIVE

## 2021-05-03 LAB — COMPREHENSIVE METABOLIC PANEL
ALT: 16 IU/L (ref 0–32)
AST: 21 IU/L (ref 0–40)
Albumin/Globulin Ratio: 2.3 — ABNORMAL HIGH (ref 1.2–2.2)
Albumin: 4.6 g/dL (ref 3.8–4.8)
Alkaline Phosphatase: 50 IU/L (ref 44–121)
BUN/Creatinine Ratio: 17 (ref 9–23)
BUN: 13 mg/dL (ref 6–24)
Bilirubin Total: 0.4 mg/dL (ref 0.0–1.2)
CO2: 25 mmol/L (ref 20–29)
Calcium: 9.8 mg/dL (ref 8.7–10.2)
Chloride: 101 mmol/L (ref 96–106)
Creatinine, Ser: 0.78 mg/dL (ref 0.57–1.00)
Globulin, Total: 2 g/dL (ref 1.5–4.5)
Glucose: 90 mg/dL (ref 65–99)
Potassium: 4.3 mmol/L (ref 3.5–5.2)
Sodium: 138 mmol/L (ref 134–144)
Total Protein: 6.6 g/dL (ref 6.0–8.5)
eGFR: 95 mL/min/{1.73_m2} (ref >59)

## 2021-05-03 LAB — LIPID PANEL W/O CHOL/HDL RATIO
Cholesterol, Total: 154 mg/dL (ref 100–199)
HDL: 75 mg/dL (ref >39)
LDL Chol Calc (NIH): 67 mg/dL (ref 0–99)
Triglycerides: 59 mg/dL (ref 0–149)
VLDL Cholesterol Cal: 12 mg/dL (ref 5–40)

## 2021-05-03 LAB — FERRITIN: Ferritin: 21 ng/mL (ref 15–150)

## 2021-05-03 LAB — SARS-COV-2 SEMI-QUANTITATIVE TOTAL ANTIBODY, SPIKE
SARS-CoV-2 Semi-Quant Total Ab: 57.3 U/mL (ref <0.8)
SARS-CoV-2 Spike Ab Interp: POSITIVE

## 2021-05-03 LAB — VITAMIN D 25 HYDROXY (VIT D DEFICIENCY, FRACTURES): Vit D, 25-Hydroxy: 35.7 ng/mL (ref 30.0–100.0)

## 2021-05-03 LAB — HEPATITIS C ANTIBODY: Hep C Virus Ab: <0.1 s/co ratio (ref 0.0–0.9)

## 2021-05-07 ENCOUNTER — Telehealth: Payer: Self-pay | Admitting: Oncology

## 2021-05-07 NOTE — Telephone Encounter (Signed)
Patient states that she saw Dr. Laural Benes and all of her labs look good.  She is requesting to move her future appointments to 6 months.  Routing to clinical team for follow up.

## 2021-05-07 NOTE — Telephone Encounter (Signed)
Please advise 

## 2021-05-07 NOTE — Telephone Encounter (Signed)
Ok to move appts out 59months per MD. See NP at that time.

## 2021-05-07 NOTE — Telephone Encounter (Signed)
Appointments changed per patient request.  MyChart message sent for verification.

## 2021-05-10 ENCOUNTER — Inpatient Hospital Stay: Payer: No Typology Code available for payment source

## 2021-05-13 ENCOUNTER — Inpatient Hospital Stay: Payer: No Typology Code available for payment source | Admitting: Oncology

## 2021-05-14 ENCOUNTER — Ambulatory Visit: Payer: No Typology Code available for payment source

## 2021-05-24 ENCOUNTER — Other Ambulatory Visit: Payer: Self-pay | Admitting: Family Medicine

## 2021-05-24 NOTE — Telephone Encounter (Signed)
  Notes to clinic:  REQUEST FOR 90 DAYS PRESCRIPTION   Requested Prescriptions  Pending Prescriptions Disp Refills   traZODone (DESYREL) 50 MG tablet [Pharmacy Med Name: TRAZODONE 50 MG TABLET] 90 tablet 2    Sig: TAKE 0.5-1 TABLETS BY MOUTH AT BEDTIME AS NEEDED FOR SLEEP.     Psychiatry: Antidepressants - Serotonin Modulator Passed - 05/24/2021  9:31 AM      Passed - Valid encounter within last 6 months    Recent Outpatient Visits           3 weeks ago Routine general medical examination at a health care facility   [facility] [place], Megan P, DO   4 months ago Appointment canceled by hospital   [facility], Megan P, DO   6 months ago Fatigue, unspecified type   [facility] Larae Grooms, NP   7 months ago Rash due to allergy   [facility] Marjie Skiff, NP   7 months ago Hypoglycemia   [facility], Oralia Rud, DO       Future Appointments             In 5 months Alinda Dooms, NP [facility]

## 2021-06-03 ENCOUNTER — Other Ambulatory Visit: Payer: Self-pay | Admitting: Family Medicine

## 2021-06-03 DIAGNOSIS — Z20822 Contact with and (suspected) exposure to covid-19: Secondary | ICD-10-CM

## 2021-06-23 ENCOUNTER — Telehealth: Payer: No Typology Code available for payment source | Admitting: Emergency Medicine

## 2021-06-23 DIAGNOSIS — U071 COVID-19: Secondary | ICD-10-CM | POA: Diagnosis not present

## 2021-06-23 NOTE — Progress Notes (Signed)
Virtual Visit Consent   Belinda [name], you are scheduled for a virtual visit with a [facility] provider today.     Just as with appointments in the office, your consent must be obtained to participate.  Your consent will be active for this visit and any virtual visit you may have with one of our providers in the next 365 days.     If you have a MyChart account, a copy of this consent can be sent to you electronically.  All virtual visits are billed to your insurance company just like a traditional visit in the office.    As this is a virtual visit, video technology does not allow for your provider to perform a traditional examination.  This may limit your provider's ability to fully assess your condition.  If your provider identifies any concerns that need to be evaluated in person or the need to arrange testing (such as labs, EKG, etc.), we will make arrangements to do so.     Although advances in technology are sophisticated, we cannot ensure that it will always work on either your end or our end.  If the connection with a video visit is poor, the visit may have to be switched to a telephone visit.  With either a video or telephone visit, we are not always able to ensure that we have a secure connection.     I need to obtain your verbal consent now.   Are you willing to proceed with your visit today?    Belinda [name] has provided verbal consent on 06/23/2021 for a virtual visit (video or telephone).   [name], NP   Date: 06/23/2021 3:26 PM   Virtual Visit via Video Note   I, [name], connected with  Belinda [name]  ([id number], [birth year]) on 06/23/21 at  3:15 PM EDT by a video-enabled telemedicine application and verified that I am speaking with the correct person using two identifiers.  Location: Patient: Virtual Visit Location Patient: Home Provider: Virtual Visit Location Provider: Home Office   I discussed the limitations of evaluation and management by  telemedicine and the availability of in person appointments. The patient expressed understanding and agreed to proceed.    History of Present Illness: Belinda [name] is a 45 y.o. who identifies as a female who was assigned female at birth, and is being seen today for COVID.  Patient developed headache and fever yesterday, did a PCR test yesterday and results came back today positive for COVID.  Biggest complaints are headache and fever.  Fever remains 101 F even after taking Advil every 4-5 hours.  Today patient also has a mild sore throat, she feels like her chest is a little tight and she is coughing but the cough is not productive, but she does not feel short of breath or that she is having any difficulty breathing.  She also reports body aches and runny nose.  She has been using an Alka-Seltzer cold medicine for symptoms which temporarily does help her feel a little better.  HPI: HPI  Problems:  Patient Active Problem List   Diagnosis Date Noted   Rash due to allergy 10/17/2020   IDA (iron deficiency anemia) 04/04/2019   Bipolar 1 disorder, depressed (HCC) 03/22/2018   Sedative overdose 03/22/2018   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/04/2017   Arthropathy of lumbar facet joint 05/28/2016   B12 deficiency 02/13/2016   Bariatric surgery status 02/23/2014   Morbid obesity (HCC) 10/14/2011  Allergies:  Allergies  Allergen Reactions   Acetaminophen Nausea Only   Valproic Acid Other (See Comments)    Tremors   Depakote [Divalproex Sodium] Other (See Comments)    Tremors   Medications:  Current Outpatient Medications:    B-D 3CC LUER-LOK SYR 25GX1" 25G X 1" 3 ML MISC, , Disp: , Rfl:    cyanocobalamin (,VITAMIN B-12,) 1000 MCG/ML injection, Inject 1 mL (1,000 mcg total) into the muscle once a week., Disp: 12 mL, Rfl: 4   lidocaine (LIDODERM) 5 %, Place 1 patch onto the skin daily. Remove & Discard patch within 12 hours or as directed by MD, Disp: 30 patch, Rfl: 0   ondansetron (ZOFRAN) 4  MG tablet, Take 1 tablet (4 mg total) by mouth every 8 (eight) hours as needed. for nausea, Disp: 20 tablet, Rfl: 5   Probiotic Product ([employer] MOOD SUPPORT PROBIOTIC PO), Take by mouth daily., Disp: , Rfl:   Observations/Objective: Patient is well-developed, well-nourished in no acute distress.  Resting comfortably  at home.  Head is normocephalic, atraumatic.  No labored breathing.  Speech is clear and coherent with logical content.  Patient is alert and oriented at baseline.    Assessment and Plan: 1. COVID-19  Reviewed supportive care measures with patient.  If her fever reaches more than 102 F despite taking ibuprofen on a schedule per package directions, she should seek a higher level of care.  We also discussed seeking higher level of care if patient becomes short of breath or has difficulty breathing.  Encourage her to continue to use cold medicine, making sure that she does not take double NSAIDs (she is to check to see if there is aspirin in her cold medicine).  Encouraged adding Mucinex.  Encouraged increased fluid intake and rest.  Patient declines work note.   follow Up Instructions: I discussed the assessment and treatment plan with the patient. The patient was provided an opportunity to ask questions and all were answered. The patient agreed with the plan and demonstrated an understanding of the instructions.  A copy of instructions were sent to the patient via MyChart.  The patient was advised to call back or seek an in-person evaluation if the symptoms worsen or if the condition fails to improve as anticipated.  Time:  I spent 13 minutes with the patient via telehealth technology discussing the above problems/concerns.    [name], NP

## 2021-06-23 NOTE — Patient Instructions (Signed)
[name], thank you for joining [name], NP for today's virtual visit.  While this provider is not your primary care provider (PCP), if your PCP is located in our provider database this encounter information will be shared with them immediately following your visit.  Consent: (Patient) Belinda [name] provided verbal consent for this virtual visit at the beginning of the encounter.  Current Medications:  Current Outpatient Medications:    B-D 3CC LUER-LOK SYR 25GX1" 25G X 1" 3 ML MISC, , Disp: , Rfl:    cyanocobalamin (,VITAMIN B-12,) 1000 MCG/ML injection, Inject 1 mL (1,000 mcg total) into the muscle once a week., Disp: 12 mL, Rfl: 4   lidocaine (LIDODERM) 5 %, Place 1 patch onto the skin daily. Remove & Discard patch within 12 hours or as directed by MD, Disp: 30 patch, Rfl: 0   ondansetron (ZOFRAN) 4 MG tablet, Take 1 tablet (4 mg total) by mouth every 8 (eight) hours as needed. for nausea, Disp: 20 tablet, Rfl: 5   Probiotic Product ([employer] MOOD SUPPORT PROBIOTIC PO), Take by mouth daily., Disp: , Rfl:    Medications ordered in this encounter:  No orders of the defined types were placed in this encounter.    *If you need refills on other medications prior to your next appointment, please contact your pharmacy*  Follow-Up: Call back or seek an in-person evaluation if the symptoms worsen or if the condition fails to improve as anticipated.  Other Instructions Please keep well-hydrated and get plenty of rest. Start a saline nasal rinse to flush out your nasal passages. You can use plain Mucinex to help thin congestion in addition to using the Alka-Seltzer cold medicine. If your Alka-Seltzer cold medicine contains aspirin, do not use ibuprofen for fever.  If it does not contain aspirin, go ahead and take the ibuprofen as directed on ibuprofen package.  If your fever is 102 F or greater even with ibuprofen, you may need to be seen in the emergency room.  If you get short  of breath and have difficulty breathing, you should be seen in emergency room.  You were to quarantine for 5 days from onset of your symptoms.  After day 5, if you have had no fever and you are feeling better, you can end quarantine but need to mask for an additional 5 days. After day 5 if you have a fever or are having significant symptoms, please quarantine for full 10 days.  If you note any worsening of symptoms, any significant shortness of breath or any chest pain, please seek ER evaluation ASAP.  Please do not delay care!  COVID-19: What to Do if You Are Sick If you test positive and are an older adult or someone who is at high risk of getting very sick from COVID-19, treatment may be available. Contact a healthcare provider right away after a positive test to determine if you are eligible, even if your symptoms are mild right now. You can also visit a Test to Treat location and, if eligible, receive a prescription from a provider. Don't delay: Treatment must be started within the first few days to be effective. If you have a fever, cough, or other symptoms, you might have COVID-19. Most people have mild illness and are able to recover at home. If you are sick: Keep track of your symptoms. If you have an emergency warning sign (including trouble breathing), call 911. Steps to help prevent the spread of COVID-19 if you are sick  If you are sick with COVID-19 or think you might have COVID-19, follow the steps below to care for yourself and to help protect other people in your home and community. Stay home except to get medical care Stay home. Most people with COVID-19 have mild illness and can recover at home without medical care. Do not leave your home, except to get medical care. Do not visit public areas and do not go to places where you are unable to wear a mask. Take care of yourself. Get rest and stay hydrated. Take over-the-counter medicines, such as acetaminophen, to help you feel  better. Stay in touch with your doctor. Call before you get medical care. Be sure to get care if you have trouble breathing, or have any other emergency warning signs, or if you think it is an emergency. Avoid public transportation, ride-sharing, or taxis if possible. Get tested If you have symptoms of COVID-19, get tested. While waiting for test results, stay away from others, including staying apart from those living in your household. Get tested as soon as possible after your symptoms start. Treatments may be available for people with COVID-19 who are at risk for becoming very sick. Don't delay: Treatment must be started early to be effective--some treatments must begin within 5 days of your first symptoms. Contact your healthcare provider right away if your test result is positive to determine if you are eligible. Self-tests are one of several options for testing for the virus that causes COVID-19 and may be more convenient than laboratory-based tests and point-of-care tests. Ask your healthcare provider or your local health department if you need help interpreting your test results. You can visit your state, tribal, local, and territorial health department's website to look for the latest local information on testing sites. Separate yourself from other people As much as possible, stay in a specific room and away from other people and pets in your home. If possible, you should use a separate bathroom. If you need to be around other people or animals in or outside of the home, wear a well-fitting mask. Tell your close contacts that they may have been exposed to COVID-19. An infected person can spread COVID-19 starting 48 hours (or 2 days) before the person has any symptoms or tests positive. By letting your close contacts know they may have been exposed to COVID-19, you are helping to protect everyone. See COVID-19 and Animals if you have questions about pets. If you are diagnosed with COVID-19,  someone from the health department may call you. Answer the call to slow the spread. Monitor your symptoms Symptoms of COVID-19 include fever, cough, or other symptoms. Follow care instructions from your healthcare provider and local health department. Your local health authorities may give instructions on checking your symptoms and reporting information. When to seek emergency medical attention Look for emergency warning signs* for COVID-19. If someone is showing any of these signs, seek emergency medical care immediately: Trouble breathing Persistent pain or pressure in the chest New confusion Inability to wake or stay awake Pale, gray, or blue-colored skin, lips, or nail beds, depending on skin tone *This list is not all possible symptoms. Please call your medical provider for any other symptoms that are severe or concerning to you. Call 911 or call ahead to your local emergency facility: Notify the operator that you are seeking care for someone who has or may have COVID-19. Call ahead before visiting your doctor Call ahead. Many medical visits for routine care are being  postponed or done by phone or telemedicine. If you have a medical appointment that cannot be postponed, call your doctor's office, and tell them you have or may have COVID-19. This will help the office protect themselves and other patients. If you are sick, wear a well-fitting mask You should wear a mask if you must be around other people or animals, including pets (even at home). Wear a mask with the best fit, protection, and comfort for you. You don't need to wear the mask if you are alone. If you can't put on a mask (because of trouble breathing, for example), cover your coughs and sneezes in some other way. Try to stay at least 6 feet away from other people. This will help protect the people around you. Masks should not be placed on young children under age 61 years, anyone who has trouble breathing, or anyone who is not  able to remove the mask without help. Cover your coughs and sneezes Cover your mouth and nose with a tissue when you cough or sneeze. Throw away used tissues in a lined trash can. Immediately wash your hands with soap and water for at least 20 seconds. If soap and water are not available, clean your hands with an alcohol-based hand sanitizer that contains at least 60% alcohol. Clean your hands often Wash your hands often with soap and water for at least 20 seconds. This is especially important after blowing your nose, coughing, or sneezing; going to the bathroom; and before eating or preparing food. Use hand sanitizer if soap and water are not available. Use an alcohol-based hand sanitizer with at least 60% alcohol, covering all surfaces of your hands and rubbing them together until they feel dry. Soap and water are the best option, especially if hands are visibly dirty. Avoid touching your eyes, nose, and mouth with unwashed hands. Handwashing Tips Avoid sharing personal household items Do not share dishes, drinking glasses, cups, eating utensils, towels, or bedding with other people in your home. Wash these items thoroughly after using them with soap and water or put in the dishwasher. Clean surfaces in your home regularly Clean and disinfect high-touch surfaces (for example, doorknobs, tables, handles, light switches, and countertops) in your "sick room" and bathroom. In shared spaces, you should clean and disinfect surfaces and items after each use by the person who is ill. If you are sick and cannot clean, a caregiver or other person should only clean and disinfect the area around you (such as your bedroom and bathroom) on an as needed basis. Your caregiver/other person should wait as long as possible (at least several hours) and wear a mask before entering, cleaning, and disinfecting shared spaces that you use. Clean and disinfect areas that may have blood, stool, or body fluids on them. Use  household cleaners and disinfectants. Clean visible dirty surfaces with household cleaners containing soap or detergent. Then, use a household disinfectant. Use a product from [employer] List N: Disinfectants for Coronavirus (COVID-19). Be sure to follow the instructions on the label to ensure safe and effective use of the product. Many products recommend keeping the surface wet with a disinfectant for a certain period of time (look at "contact time" on the product label). You may also need to wear personal protective equipment, such as gloves, depending on the directions on the product label. Immediately after disinfecting, wash your hands with soap and water for 20 seconds. For completed guidance on cleaning and disinfecting your home, visit Complete Disinfection Guidance. Take steps  to improve ventilation at home Improve ventilation (air flow) at home to help prevent from spreading COVID-19 to other people in your household. Clear out COVID-19 virus particles in the air by opening windows, using air filters, and turning on fans in your home. Use this interactive tool to learn how to improve air flow in your home. When you can be around others after being sick with COVID-19 Deciding when you can be around others is different for different situations. Find out when you can safely end home isolation. For any additional questions about your care, contact your healthcare provider or state or local health department. 12/25/2020 Content source: [facility] for Immunization and Respiratory Diseases ([facility]), Division of Viral Diseases This information is not intended to replace advice given to you by your health care provider. Make sure you discuss any questions you have with your health care provider. Document Revised: 02/07/2021 Document Reviewed: 02/07/2021 Elsevier Patient Education  2022 [employer].      If you have been instructed to have an in-person evaluation today at a local Urgent Care  facility, please use the link below. It will take you to a list of all of our available [facility], including address, phone number and hours of operation. Please do not delay care.  [facility]  If you or a family member do not have a primary care provider, use the link below to schedule a visit and establish care. When you choose a [facility] primary care physician or advanced practice provider, you gain a long-term partner in health. Find a Primary Care Provider  Learn more about [facility]'s in-office and virtual care options: [facility] - Get Care Now

## 2021-07-22 ENCOUNTER — Telehealth: Payer: Self-pay

## 2021-07-22 ENCOUNTER — Ambulatory Visit: Payer: No Typology Code available for payment source | Admitting: Family Medicine

## 2021-07-22 NOTE — Telephone Encounter (Signed)
Pt. Ready to schedule colonoscopy Prefers to be called after 4 pm.

## 2021-07-30 NOTE — Telephone Encounter (Signed)
Returned patients call @ 4pm. LVM to call office back.

## 2021-08-09 ENCOUNTER — Ambulatory Visit
Admission: RE | Admit: 2021-08-09 | Discharge: 2021-08-09 | Disposition: A | Discharge status: Home / Self Care | Payer: No Typology Code available for payment source | Source: Ambulatory Visit | Attending: Family Medicine | Admitting: Family Medicine

## 2021-08-09 ENCOUNTER — Other Ambulatory Visit: Payer: Self-pay

## 2021-08-09 DIAGNOSIS — Z1231 Encounter for screening mammogram for malignant neoplasm of breast: Secondary | ICD-10-CM | POA: Diagnosis present

## 2021-08-20 ENCOUNTER — Other Ambulatory Visit: Payer: Self-pay

## 2021-08-20 ENCOUNTER — Ambulatory Visit: Payer: No Typology Code available for payment source | Admitting: Family Medicine

## 2021-08-20 ENCOUNTER — Encounter: Payer: Self-pay | Admitting: Family Medicine

## 2021-08-20 VITALS — BP 103/68 | HR 62 | Wt 141.6 lb

## 2021-08-20 DIAGNOSIS — K641 Second degree hemorrhoids: Secondary | ICD-10-CM | POA: Diagnosis not present

## 2021-08-20 MED ORDER — HYDROCORTISONE (PERIANAL) 2.5 % EX CREA: 1.0000 "application " | TOPICAL_CREAM | Freq: Two times a day (BID) | CUTANEOUS | 1 refills | Status: DC

## 2021-08-20 NOTE — Progress Notes (Signed)
BP 103/68   Pulse 62   Wt 141 lb 9.6 oz (64.2 kg)   SpO2 98%   BMI 25.49 kg/m    Subjective:    Patient ID: Belinda [name], female    DOB: Oct 25, 1975, 45 y.o.   MRN: [id number]  HPI: Belinda [name] is a 45 y.o. female  Chief Complaint  Patient presents with   Rectal Pain    Patient states she began feeling pain in her anal area yesterday. Patient states she believes it is a hemorrhoid    RECTAL PAIN Duration: 1 day Bright red rectal bleeding: no  Melena: no  Spotting on toilet tissue: no  Anal fullness: yes  Perianal pain: yes  Severity: moderate Perianal irritation/itching: no  Constipation: no  Chronic straining/valsava:  no  Anal trauma/intercourse: no  Hemorrhoids: not until now  Previous colonoscopy: yes   Relevant past medical, surgical, family and social history reviewed and updated as indicated. Interim medical history since our last visit reviewed. Allergies and medications reviewed and updated.  Review of Systems  Constitutional: Negative.   Respiratory: Negative.    Cardiovascular: Negative.   Gastrointestinal:  Positive for rectal pain. Negative for abdominal distention, abdominal pain, anal bleeding, blood in stool, constipation, diarrhea, nausea and vomiting.  Musculoskeletal: Negative.   Skin: Negative.   Psychiatric/Behavioral: Negative.     Per HPI unless specifically indicated above     Objective:    BP 103/68   Pulse 62   Wt 141 lb 9.6 oz (64.2 kg)   SpO2 98%   BMI 25.49 kg/m   Wt Readings from Last 3 Encounters:  08/20/21 141 lb 9.6 oz (64.2 kg)  05/02/21 153 lb 3.2 oz (69.5 kg)  12/17/20 169 lb (76.7 kg)    Physical Exam Vitals and nursing note reviewed.  Constitutional:      General: She is not in acute distress.    Appearance: Normal appearance. She is not ill-appearing, toxic-appearing or diaphoretic.  HENT:     Head: Normocephalic and atraumatic.     Right Ear: External ear normal.     Left Ear: External ear normal.      Nose: Nose normal.     Mouth/Throat:     Mouth: Mucous membranes are moist.     Pharynx: Oropharynx is clear.  Eyes:     General: No scleral icterus.       Right eye: No discharge.        Left eye: No discharge.     Extraocular Movements: Extraocular movements intact.     Conjunctiva/sclera: Conjunctivae normal.     Pupils: Pupils are equal, round, and reactive to light.  Cardiovascular:     Rate and Rhythm: Normal rate and regular rhythm.     Pulses: Normal pulses.     Heart sounds: Normal heart sounds. No murmur heard.   No friction rub. No gallop.  Pulmonary:     Effort: Pulmonary effort is normal. No respiratory distress.     Breath sounds: Normal breath sounds. No stridor. No wheezing, rhonchi or rales.  Chest:     Chest wall: No tenderness.  Genitourinary:   Musculoskeletal:        General: Normal range of motion.     Cervical back: Normal range of motion and neck supple.  Skin:    General: Skin is warm and dry.     Capillary Refill: Capillary refill takes less than 2 seconds.     Coloration: Skin is not jaundiced or  pale.     Findings: No bruising, erythema, lesion or rash.  Neurological:     General: No focal deficit present.     Mental Status: She is alert and oriented to person, place, and time. Mental status is at baseline.  Psychiatric:        Mood and Affect: Mood normal.        Behavior: Behavior normal.        Thought Content: Thought content normal.        Judgment: Judgment normal.    Results for orders placed or performed in visit on 05/02/21  CBC with Differential/Platelet  Result Value Ref Range   WBC 7.4 3.4 - 10.8 x10E3/uL   RBC 4.19 3.77 - 5.28 x10E6/uL   Hemoglobin 13.5 11.1 - 15.9 g/dL   Hematocrit 40.5 34.0 - 46.6 %   MCV 97 79 - 97 fL   MCH 32.2 26.6 - 33.0 pg   MCHC 33.3 31.5 - 35.7 g/dL   RDW 12.4 11.7 - 15.4 %   Platelets 217 150 - 450 x10E3/uL   Neutrophils 65 Not Estab. %   Lymphs 27 Not Estab. %   Monocytes 6 Not Estab. %    Eos 1 Not Estab. %   Basos 1 Not Estab. %   Neutrophils Absolute 4.8 1.4 - 7.0 x10E3/uL   Lymphocytes Absolute 2.0 0.7 - 3.1 x10E3/uL   Monocytes Absolute 0.4 0.1 - 0.9 x10E3/uL   EOS (ABSOLUTE) 0.1 0.0 - 0.4 x10E3/uL   Basophils Absolute 0.0 0.0 - 0.2 x10E3/uL   Immature Granulocytes 0 Not Estab. %   Immature Grans (Abs) 0.0 0.0 - 0.1 x10E3/uL  Comprehensive metabolic panel  Result Value Ref Range   Glucose 90 65 - 99 mg/dL   BUN 13 6 - 24 mg/dL   Creatinine, Ser 0.78 0.57 - 1.00 mg/dL   eGFR 95 >59 mL/min/1.73   BUN/Creatinine Ratio 17 9 - 23   Sodium 138 134 - 144 mmol/L   Potassium 4.3 3.5 - 5.2 mmol/L   Chloride 101 96 - 106 mmol/L   CO2 25 20 - 29 mmol/L   Calcium 9.8 8.7 - 10.2 mg/dL   Total Protein 6.6 6.0 - 8.5 g/dL   Albumin 4.6 3.8 - 4.8 g/dL   Globulin, Total 2.0 1.5 - 4.5 g/dL   Albumin/Globulin Ratio 2.3 (H) 1.2 - 2.2   Bilirubin Total 0.4 0.0 - 1.2 mg/dL   Alkaline Phosphatase 50 44 - 121 IU/L   AST 21 0 - 40 IU/L   ALT 16 0 - 32 IU/L  Lipid Panel w/o Chol/HDL Ratio  Result Value Ref Range   Cholesterol, Total 154 100 - 199 mg/dL   Triglycerides 59 0 - 149 mg/dL   HDL 75 >39 mg/dL   VLDL Cholesterol Cal 12 5 - 40 mg/dL   LDL Chol Calc (NIH) 67 0 - 99 mg/dL  Urinalysis, Routine w reflex microscopic  Result Value Ref Range   Specific Gravity, UA 1.025 1.005 - 1.030   pH, UA 7.0 5.0 - 7.5   Color, UA Yellow Yellow   Appearance Ur Clear Clear   Leukocytes,UA Negative Negative   Protein,UA Negative Negative/Trace   Glucose, UA Negative Negative   Ketones, UA Negative Negative   RBC, UA Negative Negative   Bilirubin, UA Negative Negative   Urobilinogen, Ur 0.2 0.2 - 1.0 mg/dL   Nitrite, UA Negative Negative  TSH  Result Value Ref Range   TSH 1.650 0.450 - 4.500 uIU/mL  B12  Result Value Ref Range   Vitamin B-12 649 232 - 1,245 pg/mL  HIV Antibody (routine testing w rflx)  Result Value Ref Range   HIV Screen 4th Generation wRfx Non Reactive Non  Reactive  Hepatitis C Antibody  Result Value Ref Range   Hep C Virus Ab <0.1 0.0 - 0.9 s/co ratio  VITAMIN D 25 Hydroxy (Vit-D Deficiency, Fractures)  Result Value Ref Range   Vit D, 25-Hydroxy 35.7 30.0 - 100.0 ng/mL  SARS-CoV-2 Semi-Quantitative Total Antibody, Spike  Result Value Ref Range   SARS-CoV-2 Semi-Quant Total Ab 57.3 Negative<0.8 U/mL   SARS-CoV-2 Spike Ab Interp Positive   Bayer DCA Hb A1c Waived  Result Value Ref Range   HB A1C (BAYER DCA - WAIVED) 5.3 <7.0 %  Iron and TIBC  Result Value Ref Range   Total Iron Binding Capacity 407 250 - 450 ug/dL   UIBC 314 131 - 425 ug/dL   Iron 93 27 - 159 ug/dL   Iron Saturation 23 15 - 55 %  Ferritin  Result Value Ref Range   Ferritin 21 15 - 150 ng/mL      Assessment & Plan:   Problem List Items Addressed This Visit   None Visit Diagnoses     Grade II hemorrhoids    -  Primary   Will treat with hydrocortisone cream and sitz baths. Call with any concerns or it not getting better.         Follow up plan: Return if symptoms worsen or fail to improve.

## 2021-08-22 ENCOUNTER — Telehealth: Payer: Self-pay | Admitting: Family Medicine

## 2021-08-22 ENCOUNTER — Ambulatory Visit: Payer: Self-pay | Admitting: *Deleted

## 2021-08-22 MED ORDER — LIDOCAINE-HYDROCORTISONE ACE 2.8-0.55 % RE GEL: 1.0000 "application " | Freq: Two times a day (BID) | RECTAL | 3 refills | Status: DC

## 2021-08-22 NOTE — Telephone Encounter (Signed)
Patient returned call and requesting rectal cream with lidocaine due to worsening pain today after having BM. Reports no pain during defecation but after having BM started noted increased pain. Denies rectal bleeding , no abdominal pain. Difficulty sitting today and is standing or walking at job today due to pain sitting. Please advise if new order for rectal cream with lidocaine can be ordered. Care advise given. Patient verbalized understanding of care advise and to call back if symptoms worsen.     Reason for Disposition  MODERATE-SEVERE rectal pain (i.e., interferes with school, work, or sleep)  Answer Assessment - Initial Assessment Questions 1. SYMPTOM:  "What's the main symptom you're concerned about?" (e.g., pain, itching, swelling, rash)     Worsening pain in rectal area due to hemorrhoids   3. RECTAL PAIN: "Do you have any pain around your rectum?" "How bad is the pain?"  (Scale 0-10; or mild, moderate, severe)   - NONE (0): no pain   - MILD (1-3): doesn't interfere with normal activities    - MODERATE (4-7): interferes with normal activities or awakens from sleep, limping    - SEVERE (8-10): excruciating pain, unable to have a bowel movement      Difficulty sitting . Needs to stand or walk at work  4. RECTAL ITCHING: "Do you have any itching in this area?" "How bad is the itching?"  (Scale 0-10; or mild, moderate, severe)   - NONE: no itching   - MILD: doesn't interfere with normal activities    - MODERATE-SEVERE: interferes with normal activities or awakens from sleep     na 5. CONSTIPATION: "Do you have constipation?" If Yes, ask: "How bad is it?"     No had BM this am and now worsening pain  6. CAUSE: "What do you think is causing the anus symptoms?"     Hemorrhoids  7. OTHER SYMPTOMS: "Do you have any other symptoms?"  (e.g., abdomen pain, fever, rectal bleeding, vomiting)     Denies  Protocols used: Rectal Symptoms-A-AH

## 2021-08-22 NOTE — Telephone Encounter (Signed)
Pt called in earlier requesting Dr. Carlynn Purl prescribe the hydrocortisone 2.5% (ANUSOL-HD) rectal cream with the lidocaine in it.   She's using the one without the lidocaine now for hemorrhoids.   She was seen 08/20/2021 by Dr. Olevia Perches.   Her symptoms are worse.  I called and left a voicemail to call us back and any of the nurses can assist her.

## 2021-08-22 NOTE — Telephone Encounter (Signed)
Routing to provider to advise.  

## 2021-08-23 MED ORDER — LIDOCAINE 5 % EX OINT: 1.0000 "application " | TOPICAL_OINTMENT | CUTANEOUS | 0 refills | Status: DC | PRN

## 2021-08-23 NOTE — Telephone Encounter (Signed)
See other telephone encounter regarding this.

## 2021-08-23 NOTE — Telephone Encounter (Signed)
Please let patient know that her insurance doesn't pay for the suppository with lidocaine.

## 2021-08-23 NOTE — Telephone Encounter (Addendum)
Pt called and stated that the Rx for Lidocaine-Hydrocortisone Ace 2.8-0.55 % GEL was $329 / pt asked if Dr. Laural Benes knows of something else she can use with a lower cost / please advise

## 2021-08-23 NOTE — Telephone Encounter (Signed)
Dr.Johnson has sent over Lidocaine cream for patient to her local pharmacy.

## 2021-08-23 NOTE — Addendum Note (Signed)
Addended by: Dorcas Carrow on: 08/23/2021 11:37 AM   Modules accepted: Orders

## 2021-08-23 NOTE — Telephone Encounter (Signed)
Requested medication (s) are due for refill today:   Yes prescribed 08/22/2021  Requested medication (s) are on the active medication list:   Yes  Future visit scheduled:   No   Last ordered: 08/22/2021 100 g, 3 refills  Returned because pharmacy requesting an alternative.   See their note regarding co-pay.   Requested Prescriptions  Pending Prescriptions Disp Refills   Lidocaine-Hydrocortisone Ace 2.8-0.55 % GEL [Pharmacy Med Name: LIDOCAINE-HC 2.8-0.55% GEL] 100 g 3    Sig: PLACE 1 APPLICATION RECTALLY EVERY 12 (TWELVE) HOURS. ($329)     Off-Protocol Failed - 08/22/2021  9:02 PM      Failed - Medication not assigned to a protocol, review manually.      Passed - Valid encounter within last 12 months    Recent Outpatient Visits           3 days ago Grade II hemorrhoids   [facility], Megan P, DO   3 months ago Routine general medical examination at a health care facility   [facility] [place] P, DO   7 months ago Appointment canceled by hospital   [facility], Megan P, DO   9 months ago Fatigue, unspecified type   [facility] Larae Grooms, NP   10 months ago Rash due to allergy   [facility] Marjie Skiff, NP       Future Appointments             In 2 months Alinda Dooms, NP [facility]

## 2021-08-26 ENCOUNTER — Telehealth: Payer: Self-pay

## 2021-08-26 DIAGNOSIS — K641 Second degree hemorrhoids: Secondary | ICD-10-CM

## 2021-08-26 NOTE — Telephone Encounter (Signed)
Patient would like to be referred for hemorrhoid not being resolved. Patient would like to be referred to Dr Wyline Mood, MD [facility]

## 2021-08-27 ENCOUNTER — Ambulatory Visit: Payer: No Typology Code available for payment source | Admitting: Family Medicine

## 2021-10-05 ENCOUNTER — Other Ambulatory Visit: Payer: Self-pay | Admitting: Nurse Practitioner

## 2021-10-05 NOTE — Telephone Encounter (Signed)
Requested medication (s) are due for refill today: yes  Requested medication (s) are on the active medication list: yes  Last refill:  05/02/21 #20 5 RF  Future visit scheduled: no  Notes to clinic:  med not delegated to NT to RF   Requested Prescriptions  Pending Prescriptions Disp Refills   ondansetron (ZOFRAN) 4 MG tablet [Pharmacy Med Name: ONDANSETRON HCL 4 MG TABLET] 20 tablet 5    Sig: Take 1 tablet (4 mg total) by mouth every 8 (eight) hours as needed. for nausea     Not Delegated - Gastroenterology: Antiemetics Failed - 10/05/2021  5:10 PM      Failed - This refill cannot be delegated      Passed - Valid encounter within last 6 months    Recent Outpatient Visits           1 month ago Grade II hemorrhoids   [facility] Coppock, Megan P, DO   5 months ago Routine general medical examination at a health care facility   [facility], Megan P, DO   9 months ago Appointment canceled by hospital   [facility], Megan P, DO   11 months ago Fatigue, unspecified type   [facility] Larae Grooms, NP   11 months ago Rash due to allergy   [facility] Marjie Skiff, NP       Future Appointments             In 1 month Alinda Dooms, NP [facility]

## 2021-10-08 NOTE — Telephone Encounter (Signed)
Pt states that she has gotten this filled already

## 2021-10-10 ENCOUNTER — Telehealth: Payer: Self-pay | Admitting: Family Medicine

## 2021-10-10 NOTE — Telephone Encounter (Signed)
I've put in her referral to GI, although I don't see that she's seen them. I don't need to see her for her hemorrhoid, so I'm not sure what this is for

## 2021-10-10 NOTE — Telephone Encounter (Signed)
Copied from CRM [id number]. Topic: General - Other >> Oct 10, 2021  9:27 AM Gaetana Michaelis A wrote: Reason for CRM: The patient declined to reschedule their hemorrhoid follow up on 10/18/21  The patient would like to be contacted by a member of clinical staff when possible to discuss further before scheduling an additional appointment   Please contact further

## 2021-10-17 ENCOUNTER — Ambulatory Visit: Payer: No Typology Code available for payment source | Admitting: Family Medicine

## 2021-10-18 ENCOUNTER — Ambulatory Visit: Payer: No Typology Code available for payment source | Admitting: Family Medicine

## 2021-10-31 ENCOUNTER — Other Ambulatory Visit: Payer: Self-pay | Admitting: *Deleted

## 2021-10-31 DIAGNOSIS — D509 Iron deficiency anemia, unspecified: Secondary | ICD-10-CM

## 2021-11-01 ENCOUNTER — Ambulatory Visit: Payer: No Typology Code available for payment source | Admitting: Family Medicine

## 2021-11-04 ENCOUNTER — Ambulatory Visit: Payer: No Typology Code available for payment source | Admitting: Family Medicine

## 2021-11-06 ENCOUNTER — Telehealth: Payer: Self-pay

## 2021-11-06 NOTE — Telephone Encounter (Signed)
CALLED PATIENT NO ANSWER LEFT VOICEMAIL FOR A CALL BACK °Letter sent °

## 2021-11-08 ENCOUNTER — Other Ambulatory Visit: Payer: Self-pay

## 2021-11-08 ENCOUNTER — Inpatient Hospital Stay: Payer: No Typology Code available for payment source | Attending: Oncology

## 2021-11-08 DIAGNOSIS — D508 Other iron deficiency anemias: Secondary | ICD-10-CM | POA: Insufficient documentation

## 2021-11-08 DIAGNOSIS — Z9884 Bariatric surgery status: Secondary | ICD-10-CM | POA: Diagnosis present

## 2021-11-08 DIAGNOSIS — E559 Vitamin D deficiency, unspecified: Secondary | ICD-10-CM | POA: Diagnosis not present

## 2021-11-08 DIAGNOSIS — Z79899 Other long term (current) drug therapy: Secondary | ICD-10-CM | POA: Diagnosis not present

## 2021-11-08 DIAGNOSIS — E538 Deficiency of other specified B group vitamins: Secondary | ICD-10-CM | POA: Diagnosis not present

## 2021-11-08 DIAGNOSIS — D509 Iron deficiency anemia, unspecified: Secondary | ICD-10-CM

## 2021-11-08 DIAGNOSIS — K909 Intestinal malabsorption, unspecified: Secondary | ICD-10-CM | POA: Diagnosis present

## 2021-11-08 LAB — CBC WITH DIFFERENTIAL/PLATELET
Abs Immature Granulocytes: 0.03 10*3/uL (ref 0.00–0.07)
Basophils Absolute: 0.1 10*3/uL (ref 0.0–0.1)
Basophils Relative: 1 %
Eosinophils Absolute: 0.1 10*3/uL (ref 0.0–0.5)
Eosinophils Relative: 2 %
HCT: 36.8 % (ref 36.0–46.0)
Hemoglobin: 12.5 g/dL (ref 12.0–15.0)
Immature Granulocytes: 0 %
Lymphocytes Relative: 26 %
Lymphs Abs: 2.1 10*3/uL (ref 0.7–4.0)
MCH: 32 pg (ref 26.0–34.0)
MCHC: 34 g/dL (ref 30.0–36.0)
MCV: 94.1 fL (ref 80.0–100.0)
Monocytes Absolute: 0.4 10*3/uL (ref 0.1–1.0)
Monocytes Relative: 5 %
Neutro Abs: 5.3 10*3/uL (ref 1.7–7.7)
Neutrophils Relative %: 66 %
Platelets: 225 10*3/uL (ref 150–400)
RBC: 3.91 MIL/uL (ref 3.87–5.11)
RDW: 12.3 % (ref 11.5–15.5)
WBC: 8 10*3/uL (ref 4.0–10.5)
nRBC: 0 % (ref 0.0–0.2)

## 2021-11-08 LAB — VITAMIN B12: Vitamin B-12: 313 pg/mL (ref 180–914)

## 2021-11-08 LAB — IRON AND TIBC
Iron: 94 ug/dL (ref 28–170)
Saturation Ratios: 22 % (ref 10.4–31.8)
TIBC: 426 ug/dL (ref 250–450)
UIBC: 332 ug/dL

## 2021-11-08 LAB — FERRITIN: Ferritin: 9 ng/mL — ABNORMAL LOW (ref 11–307)

## 2021-11-11 ENCOUNTER — Other Ambulatory Visit: Payer: Self-pay

## 2021-11-11 ENCOUNTER — Inpatient Hospital Stay (HOSPITAL_BASED_OUTPATIENT_CLINIC_OR_DEPARTMENT_OTHER): Payer: No Typology Code available for payment source | Admitting: Nurse Practitioner

## 2021-11-11 DIAGNOSIS — Z9884 Bariatric surgery status: Secondary | ICD-10-CM

## 2021-11-11 DIAGNOSIS — D509 Iron deficiency anemia, unspecified: Secondary | ICD-10-CM | POA: Diagnosis not present

## 2021-11-11 DIAGNOSIS — E538 Deficiency of other specified B group vitamins: Secondary | ICD-10-CM | POA: Diagnosis not present

## 2021-11-11 NOTE — Progress Notes (Signed)
HEMATOLOGY-ONCOLOGY   Virtual Visit Progress Note  I connected with [name] on 11/11/21 at  1:45 PM EST by video enabled telemedicine visit and verified that I am speaking with the correct person using two identifiers.   I discussed the limitations, risks, security and privacy concerns of performing an evaluation and management service by telemedicine and the availability of in-person appointments. I also discussed with the patient that there may be a patient responsible charge related to this service. The patient expressed understanding and agreed to proceed.   Other persons participating in the visit and their role in the encounter: none   Patients location: work  Providers location: clinic   Chief Complaint: Follow-up for iron deficiency anemia, gastric bypass.   INTERVAL HISTORY Belinda [name] is a 46 y.o. female who has above history reviewed by me today presents for follow up visit for management of iron deficiency anemia and gastric bypass. She has ongoing fatigue. Has poor sleep. Reports weight loss, some intentional, some unintentional.   Review of Systems  Constitutional:  Negative for appetite change, chills, fatigue, fever and unexpected weight change.  HENT:   Negative for hearing loss and voice change.   Eyes:  Negative for eye problems.  Respiratory:  Negative for chest tightness and cough.   Cardiovascular:  Negative for chest pain.  Gastrointestinal:  Negative for abdominal distention, abdominal pain, blood in stool and nausea.  Endocrine: Negative for hot flashes.  Genitourinary:  Negative for difficulty urinating and frequency.   Musculoskeletal:  Negative for arthralgias.  Skin:  Negative for itching and rash.  Neurological:  Negative for extremity weakness.  Hematological:  Negative for adenopathy.  Psychiatric/Behavioral:  Positive for sleep disturbance. Negative for confusion.      Past Medical History:  Diagnosis Date   Arthritis    lower  back   Bipolar affective disorder (HCC)    Controlled substance agreement signed 10/16/2017   Deviated septum    Diabetes mellitus without complication (HCC)    GERD (gastroesophageal reflux disease)    Headache(784.0)    migraines - none 2 months   Peptic ulcer    Pre-diabetes    Schizophrenia (HCC)    Sleep apnea    Substance abuse [facility])    Past Surgical History:  Procedure Laterality Date   ESOPHAGOGASTRODUODENOSCOPY  08/05/2012   Procedure: ESOPHAGOGASTRODUODENOSCOPY (EGD);  Surgeon: Kandis Cocking, MD;  Location: [facility];  Service: General;  Laterality: N/A;   ESOPHAGOGASTRODUODENOSCOPY (EGD) WITH PROPOFOL N/A 08/06/2016   Procedure: ESOPHAGOGASTRODUODENOSCOPY (EGD) WITH PROPOFOL;  Surgeon: Wyline Mood, MD;  Location: [facility];  Service: Endoscopy;  Laterality: N/A;   ESOPHAGOGASTRODUODENOSCOPY (EGD) WITH PROPOFOL N/A 11/07/2016   Procedure: ESOPHAGOGASTRODUODENOSCOPY (EGD) WITH PROPOFOL;  Surgeon: Wyline Mood, MD;  Location: [facility] ENDOSCOPY;  Service: Endoscopy;  Laterality: N/A;   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/2011   Procedure: LAPAROSCOPIC ROUX-EN-Y GASTRIC;  Surgeon: Mariella Saa, MD;  Location: [facility];  Service: General;  Laterality: N/A;  upper endoscopy   SEPTOPLASTY  2003    Family History  Problem Relation Age of Onset   Diabetes Mother    Cancer Mother 8       brain, skin   Cancer Father 44       kidney, bone, rectal   Hyperlipidemia Father    Hypertension Father    Stroke Father    Allergies Sister    Lupus Sister    Breast cancer Maternal Aunt 34   Cancer Paternal Aunt  breast   Diabetes Brother    Congenital heart disease Brother     Social History   Socioeconomic History   Marital status: Married    Spouse name: Not on file   Number of children: Not on file   Years of education: Not on file   Highest education level: Not on file  Occupational History   Occupation: unemployed  Tobacco Use   Smoking status: Former    Types:  Cigarettes    Quit date: 04/03/2017    Years since quitting: 4.6   Smokeless tobacco: Never   Tobacco comments:    only smoked for about 71months   Vaping Use   Vaping Use: Never used  Substance and Sexual Activity   Alcohol use: Yes    Alcohol/week: 3.0 standard drinks    Types: 3 Standard drinks or equivalent per week    Comment: a few times a week   Drug use: Not Currently    Types: Marijuana   Sexual activity: Yes    Birth control/protection: None  Other Topics Concern   Not on file  Social History Narrative   Not on file   Social Determinants of Health   Financial Resource Strain: Not on file  Food Insecurity: Not on file  Transportation Needs: Not on file  Physical Activity: Not on file  Stress: Not on file  Social Connections: Not on file  Intimate Partner Violence: Not on file    Current Outpatient Medications on File Prior to Visit  Medication Sig Dispense Refill   B-D 3CC LUER-LOK SYR 25GX1" 25G X 1" 3 ML MISC      cyanocobalamin (,VITAMIN B-12,) 1000 MCG/ML injection Inject 1 mL (1,000 mcg total) into the muscle once a week. 12 mL 4   hydrocortisone (ANUSOL-HC) 2.5 % rectal cream Place 1 application rectally 2 (two) times daily. 30 g 1   lidocaine (LIDODERM) 5 % Place 1 patch onto the skin daily. Remove & Discard patch within 12 hours or as directed by MD 30 patch 0   lidocaine (XYLOCAINE) 5 % ointment Apply 1 application topically as needed. 35.44 g 0   Lidocaine-Hydrocortisone Ace 2.8-0.55 % GEL Place 1 application rectally every 12 (twelve) hours. 100 g 3   ondansetron (ZOFRAN) 4 MG tablet Take 1 tablet (4 mg total) by mouth every 8 (eight) hours as needed. for nausea 20 tablet 5   Probiotic Product ([employer] MOOD SUPPORT PROBIOTIC PO) Take by mouth daily.     No current facility-administered medications on file prior to visit.    Allergies  Allergen Reactions   Acetaminophen Nausea Only   Valproic Acid Other (See Comments)    Tremors   Depakote [Divalproex  Sodium] Other (See Comments)    Tremors       Observations/Objective: There were no vitals filed for this visit. There is no height or weight on file to calculate BMI.  Physical Exam Constitutional:      General: She is not in acute distress. Neurological:     Mental Status: She is alert.  Psychiatric:        Mood and Affect: Mood normal.    CBC    Component Value Date/Time   WBC 8.0 11/08/2021 1351   RBC 3.91 11/08/2021 1351   HGB 12.5 11/08/2021 1351   HGB 13.5 05/02/2021 0907   HCT 36.8 11/08/2021 1351   HCT 40.5 05/02/2021 0907   PLT 225 11/08/2021 1351   PLT 217 05/02/2021 0907   MCV 94.1 11/08/2021  1351   MCV 97 05/02/2021 0907   MCH 32.0 11/08/2021 1351   MCHC 34.0 11/08/2021 1351   RDW 12.3 11/08/2021 1351   RDW 12.4 05/02/2021 0907   LYMPHSABS 2.1 11/08/2021 1351   LYMPHSABS 2.0 05/02/2021 0907   MONOABS 0.4 11/08/2021 1351   EOSABS 0.1 11/08/2021 1351   EOSABS 0.1 05/02/2021 0907   BASOSABS 0.1 11/08/2021 1351   BASOSABS 0.0 05/02/2021 0907     CMP    Component Value Date/Time   NA 138 05/02/2021 0907   K 4.3 05/02/2021 0907   CL 101 05/02/2021 0907   CO2 25 05/02/2021 0907   GLUCOSE 90 05/02/2021 0907   GLUCOSE 96 05/13/2019 1333   BUN 13 05/02/2021 0907   CREATININE 0.78 05/02/2021 0907   CREATININE 0.88 11/14/2011 1728   CALCIUM 9.8 05/02/2021 0907   PROT 6.6 05/02/2021 0907   ALBUMIN 4.6 05/02/2021 0907   AST 21 05/02/2021 0907   ALT 16 05/02/2021 0907   ALKPHOS 50 05/02/2021 0907   BILITOT 0.4 05/02/2021 0907   GFRNONAA 99 11/05/2020 0835   GFRAA 114 11/05/2020 0835    Iron/TIBC/Ferritin/ %Sat    Component Value Date/Time   IRON 94 11/08/2021 1351   IRON 93 05/02/2021 0907   TIBC 426 11/08/2021 1351   TIBC 407 05/02/2021 0907   FERRITIN 9 (L) 11/08/2021 1351   FERRITIN 21 05/02/2021 0907   IRONPCTSAT 22 11/08/2021 1351   IRONPCTSAT 23 05/02/2021 0907     Assessment and Plan: No diagnosis found.   Iron Deficiency -  secondary to malabsorption in setting of gastric bypass surgery. Hemoglobin is normal but low normal. Ferritin is decreased at 9. Recommend venofer x 4.   B12 deficiency - She has decreased frequency of b12 injections to once every 2 weeks. B12 level has dipped. Suspect she may be less symptoms with b12 level > 500. Increase to weekly.   Fatigue- question is secondary to underlying iron and b12 levels. Likely other comorbidities contributing as well. If symptoms don't improve with improvement in her labs, recommend she follow up with pcp.   Vitamin D deficiency- continue vitamin D supplementation, managed by primary care provider.  Follow Up Instructions: Venofer x 4  Rtc in 3 mo for labs Day to week later virtual visit with Dr Cathie Hoops. - la  I discussed the assessment and treatment plan with the patient. The patient was provided an opportunity to ask questions and all were answered. The patient agreed with the plan and demonstrated an understanding of the instructions.   The patient was advised to call back or seek an in-person evaluation if the symptoms worsen or if the condition fails to improve as anticipated.   I spent 20 minutes face-to-face video visit time dedicated to the care of this patient on the date of this encounter to include pre-visit review of labs, hematology notes, face-to-face time with the patient, and post visit ordering of testing/documentation.   Alinda Dooms, NP 11/11/2021

## 2021-11-12 ENCOUNTER — Inpatient Hospital Stay: Payer: No Typology Code available for payment source

## 2021-11-12 VITALS — BP 110/63 | HR 89 | Temp 97.0°F | Resp 18

## 2021-11-12 DIAGNOSIS — D509 Iron deficiency anemia, unspecified: Secondary | ICD-10-CM

## 2021-11-12 DIAGNOSIS — D508 Other iron deficiency anemias: Secondary | ICD-10-CM | POA: Diagnosis not present

## 2021-11-12 MED ORDER — IRON SUCROSE 20 MG/ML IV SOLN
200.0000 mg | Freq: Once | INTRAVENOUS | Status: AC
  Administered 2021-11-12: 200 mg via INTRAVENOUS
  Filled 2021-11-12: qty 10

## 2021-11-12 MED ORDER — SODIUM CHLORIDE 0.9 % IV SOLN
INTRAVENOUS | Status: DC | PRN
  Filled 2021-11-12: qty 250

## 2021-11-19 ENCOUNTER — Inpatient Hospital Stay: Payer: No Typology Code available for payment source

## 2021-11-19 ENCOUNTER — Other Ambulatory Visit: Payer: Self-pay

## 2021-11-19 VITALS — BP 97/64 | HR 62 | Temp 97.2°F | Resp 17

## 2021-11-19 DIAGNOSIS — D508 Other iron deficiency anemias: Secondary | ICD-10-CM | POA: Diagnosis not present

## 2021-11-19 DIAGNOSIS — D509 Iron deficiency anemia, unspecified: Secondary | ICD-10-CM

## 2021-11-19 MED ORDER — SODIUM CHLORIDE 0.9 % IV SOLN
Freq: Once | INTRAVENOUS | Status: AC
  Filled 2021-11-19: qty 250

## 2021-11-19 MED ORDER — IRON SUCROSE 20 MG/ML IV SOLN
200.0000 mg | Freq: Once | INTRAVENOUS | Status: AC
  Administered 2021-11-19: 200 mg via INTRAVENOUS
  Filled 2021-11-19: qty 10

## 2021-11-19 NOTE — Patient Instructions (Signed)

## 2021-11-20 ENCOUNTER — Encounter: Payer: Self-pay | Admitting: Family Medicine

## 2021-11-21 ENCOUNTER — Inpatient Hospital Stay: Payer: No Typology Code available for payment source

## 2021-11-21 ENCOUNTER — Other Ambulatory Visit: Payer: Self-pay

## 2021-11-21 VITALS — BP 104/58 | HR 66 | Temp 98.9°F | Resp 16

## 2021-11-21 DIAGNOSIS — D508 Other iron deficiency anemias: Secondary | ICD-10-CM | POA: Diagnosis not present

## 2021-11-21 DIAGNOSIS — D509 Iron deficiency anemia, unspecified: Secondary | ICD-10-CM

## 2021-11-21 MED ORDER — IRON SUCROSE 20 MG/ML IV SOLN
200.0000 mg | Freq: Once | INTRAVENOUS | Status: AC
  Administered 2021-11-21: 200 mg via INTRAVENOUS
  Filled 2021-11-21: qty 10

## 2021-11-21 MED ORDER — SODIUM CHLORIDE 0.9 % IV SOLN
Freq: Once | INTRAVENOUS | Status: AC
  Filled 2021-11-21: qty 250

## 2021-11-21 NOTE — Patient Instructions (Signed)

## 2021-11-22 ENCOUNTER — Encounter: Payer: Self-pay | Admitting: Family Medicine

## 2021-11-22 ENCOUNTER — Ambulatory Visit: Payer: Self-pay | Admitting: *Deleted

## 2021-11-22 ENCOUNTER — Ambulatory Visit: Payer: No Typology Code available for payment source | Admitting: Family Medicine

## 2021-11-22 VITALS — Temp 98.4°F | Wt 135.8 lb

## 2021-11-22 DIAGNOSIS — R42 Dizziness and giddiness: Secondary | ICD-10-CM

## 2021-11-22 DIAGNOSIS — Z1211 Encounter for screening for malignant neoplasm of colon: Secondary | ICD-10-CM | POA: Diagnosis not present

## 2021-11-22 DIAGNOSIS — D509 Iron deficiency anemia, unspecified: Secondary | ICD-10-CM

## 2021-11-22 DIAGNOSIS — R634 Abnormal weight loss: Secondary | ICD-10-CM

## 2021-11-22 NOTE — Telephone Encounter (Signed)
°  Chief Complaint: dizziness, SOB Symptoms: mild SOB- exertion, dizziness with activity-patient had iron infusion yesterday- BP was low yesterday Frequency: on/off Pertinent Negatives: Patient denies chest pain, fever Disposition: [] ED /[] Urgent Care (no appt availability in office) / [x] Appointment(In office/virtual)/ []  [facility]/ [] Home Care/ [] Refused Recommended Disposition /[] [place] [facility]/ []  Follow-up with PCP Additional Notes: Patient has been schedule this morning and will have someone drive her. Patient advised if gets worse in any way- go to ED

## 2021-11-22 NOTE — Progress Notes (Signed)
Interpreted by me on 11/22/21. NSR at 67bpm No ST segment changes

## 2021-11-22 NOTE — Assessment & Plan Note (Signed)
Likely multifactorial. Working with hematology on her iron and B12 deficiencies. Will continue to monitor and continue her infusions. Not orthostatic. Will check other labs today- await results. Slight nystagmus to the R on exam, ?vertiginous component. Will start epley's maneuver. If labs normal, likely needs MRI w and w/o to r/o vestibular neuroma. Continue to monitor closely.

## 2021-11-22 NOTE — Assessment & Plan Note (Signed)
Following with hematology. Continue to monitor closely. Has had continued weight loss without effort and worsening anemia. Due for screening colonoscopy- we will get set up ASAP.

## 2021-11-22 NOTE — Telephone Encounter (Signed)
Reason for Disposition  [1] MILD difficulty breathing (e.g., minimal/no SOB at rest, SOB with walking, pulse <100) AND [2] NEW-onset or WORSE than normal  Answer Assessment - Initial Assessment Questions 1. RESPIRATORY STATUS: "Describe your breathing?" (e.g., wheezing, shortness of breath, unable to speak, severe coughing)      Out of breath, dizziness 2. ONSET: "When did this breathing problem begin?"      Having problems with iron levels 3. PATTERN "Does the difficult breathing come and go, or has it been constant since it started?"      Comes and goes- exertion 4. SEVERITY: "How bad is your breathing?" (e.g., mild, moderate, severe)    - MILD: No SOB at rest, mild SOB with walking, speaks normally in sentences, can lie down, no retractions, pulse < 100.    - MODERATE: SOB at rest, SOB with minimal exertion and prefers to sit, cannot lie down flat, speaks in phrases, mild retractions, audible wheezing, pulse 100-120.    - SEVERE: Very SOB at rest, speaks in single words, struggling to breathe, sitting hunched forward, retractions, pulse > 120      mild 5. RECURRENT SYMPTOM: "Have you had difficulty breathing before?" If Yes, ask: "When was the last time?" and "What happened that time?"      anemia 6. CARDIAC HISTORY: "Do you have any history of heart disease?" (e.g., heart attack, angina, bypass surgery, angioplasty)      No chest 7. LUNG HISTORY: "Do you have any history of lung disease?"  (e.g., pulmonary embolus, asthma, emphysema)     *No Answer* 8. CAUSE: "What do you think is causing the breathing problem?"      *No Answer* 9. OTHER SYMPTOMS: "Do you have any other symptoms? (e.g., dizziness, runny nose, cough, chest pain, fever)     dizziness 10. O2 SATURATION MONITOR:  "Do you use an oxygen saturation monitor (pulse oximeter) at home?" If Yes, "What is your reading (oxygen level) today?" "What is your usual oxygen saturation reading?" (e.g., 95%)       Was normal yesterday 11.  PREGNANCY: "Is there any chance you are pregnant?" "When was your last menstrual period?"       *No Answer* 12. TRAVEL: "Have you traveled out of the country in the last month?" (e.g., travel history, exposures)       *No Answer*  Protocols used: Breathing Difficulty-A-AH

## 2021-11-22 NOTE — Progress Notes (Signed)
Temp 98.4 F (36.9 C)    Wt 135 lb 12.8 oz (61.6 kg)    SpO2 99%    BMI 24.44 kg/m    Subjective:    Patient ID: Belinda [name], female    DOB: 07-Dec-1975, 46 y.o.   MRN: [id number]  HPI: Belinda [name] is a 46 y.o. female  Chief Complaint  Patient presents with   Anemia    Patient states she has been having headaches and feeling tired. Patient states she had labs checked and had to restart iron infusions again because she was anemic. Patient states she is feeling dizzy and trouble with her breathing.    FATIGUE- known anemia from b12 deficiency and iron def secondary to malabsorption due to gastric bypass. Saw hematology 11 days ago and got started on iron infusions and increased her B12. She has had 3 infusions so far, but is not feeling any better Duration:  chronic worse in the past few weeks Severity: severe  Onset: sudden Context when symptoms started:  unknown Symptoms improve with rest: no  Depressive symptoms: no Stress/anxiety: no Insomnia: yes hard to fall asleep Snoring: no Daytime hypersomnolence:yes Wakes feeling refreshed: no History of sleep study: yes Dysnea on exertion:  yes Orthopnea/PND: no Chest pain: no Chronic cough: no Lower extremity edema: no Arthralgias:no Myalgias: no Weakness: no Rash: no  DIZZINESS Duration: couple of weeks Description of symptoms: off kilter Duration of episode:  seconds to hours Dizziness frequency: recurrent Provoking factors:  changing position Aggravating factors:  unsure Triggered by rolling over in bed: no Triggered by bending over: yes Aggravated by head movement: no Aggravated by exertion, coughing, loud noises: no Recent head injury: no Recent or current viral symptoms: no History of vasovagal episodes: no Nausea: no Vomiting: no Tinnitus: yes Hearing loss: no Aural fullness: no Headache: yes Photophobia/phonophobia: yes Unsteady gait: yes Postural instability: yes Diplopia, dysarthria,  dysphagia or weakness: no Related to exertion: no Pallor: yes Diaphoresis: no Dyspnea: yes Chest pain: no  Relevant past medical, surgical, family and social history reviewed and updated as indicated. Interim medical history since our last visit reviewed. Allergies and medications reviewed and updated.  Review of Systems  Constitutional:  Positive for fatigue and unexpected weight change. Negative for activity change, appetite change, chills, diaphoresis and fever.  HENT: Negative.    Eyes:  Positive for photophobia. Negative for pain, discharge, redness, itching and visual disturbance.  Respiratory:  Positive for shortness of breath. Negative for apnea, cough, choking, chest tightness, wheezing and stridor.   Cardiovascular: Negative.   Gastrointestinal: Negative.   Endocrine: Negative.   Musculoskeletal: Negative.   Allergic/Immunologic: Negative.   Neurological:  Positive for dizziness, light-headedness and headaches. Negative for tremors, seizures, syncope, facial asymmetry, speech difficulty, weakness and numbness.  Psychiatric/Behavioral: Negative.     Per HPI unless specifically indicated above     Objective:    Temp 98.4 F (36.9 C)    Wt 135 lb 12.8 oz (61.6 kg)    SpO2 99%    BMI 24.44 kg/m   Wt Readings from Last 3 Encounters:  11/22/21 135 lb 12.8 oz (61.6 kg)  08/20/21 141 lb 9.6 oz (64.2 kg)  05/02/21 153 lb 3.2 oz (69.5 kg)    Physical Exam Vitals and nursing note reviewed.  Constitutional:      General: She is not in acute distress.    Appearance: Normal appearance. She is not ill-appearing, toxic-appearing or diaphoretic.  HENT:     Head: Normocephalic  and atraumatic.     Right Ear: External ear normal.     Left Ear: External ear normal.     Nose: Nose normal.     Mouth/Throat:     Mouth: Mucous membranes are moist.     Pharynx: Oropharynx is clear.  Eyes:     General: No scleral icterus.       Right eye: No discharge.        Left eye: No  discharge.     Extraocular Movements: Extraocular movements intact.     Left eye: Nystagmus present.     Conjunctiva/sclera: Conjunctivae normal.     Pupils: Pupils are equal, round, and reactive to light.  Cardiovascular:     Rate and Rhythm: Normal rate and regular rhythm.     Pulses: Normal pulses.     Heart sounds: Normal heart sounds. No murmur heard.   No friction rub. No gallop.  Pulmonary:     Effort: Pulmonary effort is normal. No respiratory distress.     Breath sounds: Normal breath sounds. No stridor. No wheezing, rhonchi or rales.  Chest:     Chest wall: No tenderness.  Musculoskeletal:        General: Normal range of motion.     Cervical back: Normal range of motion and neck supple.  Skin:    General: Skin is warm and dry.     Capillary Refill: Capillary refill takes less than 2 seconds.     Coloration: Skin is not jaundiced or pale.     Findings: No bruising, erythema, lesion or rash.  Neurological:     General: No focal deficit present.     Mental Status: She is alert and oriented to person, place, and time. Mental status is at baseline.  Psychiatric:        Mood and Affect: Mood normal.        Behavior: Behavior normal.        Thought Content: Thought content normal.        Judgment: Judgment normal.    Results for orders placed or performed in visit on 11/08/21  Iron and TIBC([facility])  Result Value Ref Range   Iron 94 28 - 170 ug/dL   TIBC 426 250 - 450 ug/dL   Saturation Ratios 22 10.4 - 31.8 %   UIBC 332 ug/dL  Vitamin B12  Result Value Ref Range   Vitamin B-12 313 180 - 914 pg/mL  Ferritin  Result Value Ref Range   Ferritin 9 (L) 11 - 307 ng/mL  CBC with Differential/Platelet  Result Value Ref Range   WBC 8.0 4.0 - 10.5 K/uL   RBC 3.91 3.87 - 5.11 MIL/uL   Hemoglobin 12.5 12.0 - 15.0 g/dL   HCT 36.8 36.0 - 46.0 %   MCV 94.1 80.0 - 100.0 fL   MCH 32.0 26.0 - 34.0 pg   MCHC 34.0 30.0 - 36.0 g/dL   RDW 12.3 11.5 - 15.5 %   Platelets  225 150 - 400 K/uL   nRBC 0.0 0.0 - 0.2 %   Neutrophils Relative % 66 %   Neutro Abs 5.3 1.7 - 7.7 K/uL   Lymphocytes Relative 26 %   Lymphs Abs 2.1 0.7 - 4.0 K/uL   Monocytes Relative 5 %   Monocytes Absolute 0.4 0.1 - 1.0 K/uL   Eosinophils Relative 2 %   Eosinophils Absolute 0.1 0.0 - 0.5 K/uL   Basophils Relative 1 %   Basophils Absolute 0.1 0.0 - 0.1 K/uL  Immature Granulocytes 0 %   Abs Immature Granulocytes 0.03 0.00 - 0.07 K/uL      Assessment & Plan:   Problem List Items Addressed This Visit       Other   IDA (iron deficiency anemia)    Following with hematology. Continue to monitor closely. Has had continued weight loss without effort and worsening anemia. Due for screening colonoscopy- we will get set up ASAP.      Relevant Orders   Ambulatory referral to Gastroenterology   Dizziness - Primary    Likely multifactorial. Working with hematology on her iron and B12 deficiencies. Will continue to monitor and continue her infusions. Not orthostatic. Will check other labs today- await results. Slight nystagmus to the R on exam, ?vertiginous component. Will start epley's maneuver. If labs normal, likely needs MRI w and w/o to r/o vestibular neuroma. Continue to monitor closely.      Relevant Orders   Comprehensive metabolic panel   TSH   VITAMIN D 25 Hydroxy (Vit-D Deficiency, Fractures)   EKG 12-Lead (Completed)   Other Visit Diagnoses     Weight loss       Has had continued weight loss without effort and worsening anemia. Due for screening colonoscopy- we will get set up ASAP.   Relevant Orders   Ambulatory referral to Gastroenterology   Screening for colon cancer       Has had continued weight loss without effort and worsening anemia. Due for screening colonoscopy- we will get set up ASAP.   Relevant Orders   Ambulatory referral to Gastroenterology        Follow up plan: Return in about 2 weeks (around 12/06/2021).

## 2021-11-23 LAB — COMPREHENSIVE METABOLIC PANEL
ALT: 17 IU/L (ref 0–32)
AST: 20 IU/L (ref 0–40)
Albumin/Globulin Ratio: 2.4 — ABNORMAL HIGH (ref 1.2–2.2)
Albumin: 4.3 g/dL (ref 3.8–4.8)
Alkaline Phosphatase: 46 IU/L (ref 44–121)
BUN/Creatinine Ratio: 17 (ref 9–23)
BUN: 15 mg/dL (ref 6–24)
Bilirubin Total: 0.3 mg/dL (ref 0.0–1.2)
CO2: 24 mmol/L (ref 20–29)
Calcium: 9.2 mg/dL (ref 8.7–10.2)
Chloride: 104 mmol/L (ref 96–106)
Creatinine, Ser: 0.86 mg/dL (ref 0.57–1.00)
Globulin, Total: 1.8 g/dL (ref 1.5–4.5)
Glucose: 76 mg/dL (ref 70–99)
Potassium: 4.6 mmol/L (ref 3.5–5.2)
Sodium: 139 mmol/L (ref 134–144)
Total Protein: 6.1 g/dL (ref 6.0–8.5)
eGFR: 85 mL/min/{1.73_m2} (ref >59)

## 2021-11-23 LAB — VITAMIN D 25 HYDROXY (VIT D DEFICIENCY, FRACTURES): Vit D, 25-Hydroxy: 23.5 ng/mL — ABNORMAL LOW (ref 30.0–100.0)

## 2021-11-23 LAB — TSH: TSH: 1.39 u[IU]/mL (ref 0.450–4.500)

## 2021-11-25 ENCOUNTER — Telehealth: Payer: Self-pay

## 2021-11-25 ENCOUNTER — Ambulatory Visit: Payer: No Typology Code available for payment source | Admitting: Gastroenterology

## 2021-11-25 ENCOUNTER — Other Ambulatory Visit: Payer: Self-pay | Admitting: Family Medicine

## 2021-11-25 MED ORDER — VITAMIN D (ERGOCALCIFEROL) 1.25 MG (50000 UNIT) PO CAPS: 50000.0000 [IU] | ORAL_CAPSULE | ORAL | 3 refills | Status: AC

## 2021-11-25 NOTE — Telephone Encounter (Signed)
Patient called back to schedule colonoscopy.  ?

## 2021-11-25 NOTE — Telephone Encounter (Signed)
CALLED PATIENT NO ANSWER LEFT VOICEMAIL FOR A CALL BACK ? ?

## 2021-11-26 ENCOUNTER — Telehealth: Payer: Self-pay

## 2021-11-26 ENCOUNTER — Other Ambulatory Visit: Payer: Self-pay

## 2021-11-26 ENCOUNTER — Inpatient Hospital Stay: Payer: No Typology Code available for payment source

## 2021-11-26 VITALS — BP 109/70 | HR 57 | Temp 98.8°F | Resp 16

## 2021-11-26 DIAGNOSIS — D508 Other iron deficiency anemias: Secondary | ICD-10-CM | POA: Diagnosis not present

## 2021-11-26 DIAGNOSIS — D509 Iron deficiency anemia, unspecified: Secondary | ICD-10-CM

## 2021-11-26 MED ORDER — SODIUM CHLORIDE 0.9 % IV SOLN
Freq: Once | INTRAVENOUS | Status: AC
  Filled 2021-11-26: qty 250

## 2021-11-26 MED ORDER — IRON SUCROSE 20 MG/ML IV SOLN
200.0000 mg | Freq: Once | INTRAVENOUS | Status: AC
  Administered 2021-11-26: 200 mg via INTRAVENOUS
  Filled 2021-11-26: qty 10

## 2021-11-26 NOTE — Telephone Encounter (Signed)
CALLED PATIENT NO ANSWER LEFT VOICEMAIL FOR A CALL BACk ° °

## 2021-11-26 NOTE — Patient Instructions (Signed)

## 2021-11-26 NOTE — Telephone Encounter (Signed)
CALLED PATIENT NO ANSWER LEFT VOICEMAIL FOR A CALL BACK ? ?

## 2021-11-27 ENCOUNTER — Telehealth: Payer: Self-pay

## 2021-11-27 NOTE — Telephone Encounter (Signed)
CALLED PATIENT NO ANSWER LEFT VOICEMAIL FOR A CALL BACK °Letter sent °

## 2021-11-28 ENCOUNTER — Inpatient Hospital Stay: Payer: No Typology Code available for payment source

## 2021-11-28 ENCOUNTER — Other Ambulatory Visit: Payer: Self-pay

## 2021-11-28 VITALS — BP 111/64 | HR 79 | Resp 20

## 2021-11-28 DIAGNOSIS — D509 Iron deficiency anemia, unspecified: Secondary | ICD-10-CM

## 2021-11-28 DIAGNOSIS — D508 Other iron deficiency anemias: Secondary | ICD-10-CM | POA: Diagnosis not present

## 2021-11-28 MED ORDER — IRON SUCROSE 20 MG/ML IV SOLN
200.0000 mg | Freq: Once | INTRAVENOUS | Status: AC
  Administered 2021-11-28: 200 mg via INTRAVENOUS
  Filled 2021-11-28: qty 10

## 2021-11-28 MED ORDER — SODIUM CHLORIDE 0.9 % IV SOLN
INTRAVENOUS | Status: DC
  Filled 2021-11-28: qty 250

## 2021-12-04 ENCOUNTER — Encounter: Payer: Self-pay | Admitting: Family Medicine

## 2021-12-04 ENCOUNTER — Ambulatory Visit: Payer: No Typology Code available for payment source | Admitting: Family Medicine

## 2021-12-04 ENCOUNTER — Other Ambulatory Visit: Payer: Self-pay

## 2021-12-04 VITALS — Temp 98.1°F | Ht 62.5 in | Wt 136.8 lb

## 2021-12-04 DIAGNOSIS — R42 Dizziness and giddiness: Secondary | ICD-10-CM

## 2021-12-04 NOTE — Progress Notes (Signed)
? ?Temp 98.1 ?F (36.7 ?C) (Oral)   Ht 5' 2.5" (1.588 m)   Wt 136 lb 12.8 oz (62.1 kg)   SpO2 98%   BMI 24.62 kg/m?   ? ?Subjective:  ? ? Patient ID: Belinda [name], female    DOB: Jul 02, 1976, 46 y.o.   MRN: [id number] ? ?HPI: ?Belinda [name] is a 46 y.o. female ? ?Chief Complaint  ?Patient presents with  ? Dizziness  ?  Patient states she is feeling a little better. Patient says she is not having the headaches as often. Patient states she is still extremely tired. Patient says is having stomach pain. Patient says her weight loss has been giving her issues and thinks that may be why she is having stomach issues.   ? ?DIZZINESS- doing a little better.  ?Duration:  chronic, better in the past couple of d ays, but still not good. Still spinning, nauseous and vomiting. ?Description of symptoms: lightheaded and room spinning ?Duration of episode: hours ?Dizziness frequency: recurrent ?Provoking factors: none ?Aggravating factors:  unknown ?Triggered by rolling over in bed: no ?Triggered by bending over: yes ?Aggravated by head movement: no ?Aggravated by exertion, coughing, loud noises: no ?Recent head injury: no ?Recent or current viral symptoms: no ?History of vasovagal episodes: no ?Nausea: yes ?Vomiting: yes ?Tinnitus: yes ?Hearing loss: no ?Aural fullness: yes ?Headache: yes ?Photophobia/phonophobia: yes ?Unsteady gait: yes ?Postural instability: yes ?Diplopia, dysarthria, dysphagia or weakness: yes ?Related to exertion: no ?Pallor: no ?Diaphoresis: no ?Dyspnea: no ?Chest pain: no ? ?Relevant past medical, surgical, family and social history reviewed and updated as indicated. Interim medical history since our last visit reviewed. ?Allergies and medications reviewed and updated. ? ?Review of Systems  ?Constitutional:  Positive for fatigue. Negative for activity change, appetite change, chills, diaphoresis, fever and unexpected weight change.  ?HENT: Negative.    ?Respiratory: Negative.    ?Cardiovascular:  Negative.   ?Gastrointestinal: Negative.   ?Musculoskeletal: Negative.   ?Neurological:  Positive for dizziness, light-headedness and headaches. Negative for tremors, seizures, syncope, facial asymmetry, speech difficulty, weakness and numbness.  ?Psychiatric/Behavioral: Negative.    ? ?Per HPI unless specifically indicated above ? ?   ?Objective:  ?  ?Temp 98.1 ?F (36.7 ?C) (Oral)   Ht 5' 2.5" (1.588 m)   Wt 136 lb 12.8 oz (62.1 kg)   SpO2 98%   BMI 24.62 kg/m?   ?Wt Readings from Last 3 Encounters:  ?12/04/21 136 lb 12.8 oz (62.1 kg)  ?11/22/21 135 lb 12.8 oz (61.6 kg)  ?08/20/21 141 lb 9.6 oz (64.2 kg)  ?  ?Physical Exam ?Vitals and nursing note reviewed.  ?Constitutional:   ?   General: She is not in acute distress. ?   Appearance: Normal appearance. She is normal weight. She is not ill-appearing, toxic-appearing or diaphoretic.  ?HENT:  ?   Head: Normocephalic and atraumatic.  ?   Right Ear: External ear normal.  ?   Left Ear: External ear normal.  ?   Nose: Nose normal.  ?   Mouth/Throat:  ?   Mouth: Mucous membranes are moist.  ?   Pharynx: Oropharynx is clear.  ?Eyes:  ?   General: No scleral icterus.    ?   Right eye: No discharge.     ?   Left eye: No discharge.  ?   Extraocular Movements: Extraocular movements intact.  ?   Left eye: Abnormal extraocular motion present.  ?   Conjunctiva/sclera: Conjunctivae normal.  ?   Pupils:  Pupils are equal, round, and reactive to light.  ?Cardiovascular:  ?   Rate and Rhythm: Normal rate and regular rhythm.  ?   Pulses: Normal pulses.  ?   Heart sounds: Normal heart sounds. No murmur heard. ?  No friction rub. No gallop.  ?Pulmonary:  ?   Effort: Pulmonary effort is normal. No respiratory distress.  ?   Breath sounds: Normal breath sounds. No stridor. No wheezing, rhonchi or rales.  ?Chest:  ?   Chest wall: No tenderness.  ?Musculoskeletal:     ?   General: Normal range of motion.  ?   Cervical back: Normal range of motion and neck supple.  ?Skin: ?   General:  Skin is warm and dry.  ?   Capillary Refill: Capillary refill takes less than 2 seconds.  ?   Coloration: Skin is not jaundiced or pale.  ?   Findings: No bruising, erythema, lesion or rash.  ?Neurological:  ?   General: No focal deficit present.  ?   Mental Status: She is alert and oriented to person, place, and time. Mental status is at baseline.  ?Psychiatric:     ?   Mood and Affect: Mood normal.     ?   Behavior: Behavior normal.     ?   Thought Content: Thought content normal.     ?   Judgment: Judgment normal.  ? ? ?Results for orders placed or performed in visit on 11/22/21  ?Comprehensive metabolic panel  ?Result Value Ref Range  ? Glucose 76 70 - 99 mg/dL  ? BUN 15 6 - 24 mg/dL  ? Creatinine, Ser 0.86 0.57 - 1.00 mg/dL  ? eGFR 85 >59 mL/min/1.73  ? BUN/Creatinine Ratio 17 9 - 23  ? Sodium 139 134 - 144 mmol/L  ? Potassium 4.6 3.5 - 5.2 mmol/L  ? Chloride 104 96 - 106 mmol/L  ? CO2 24 20 - 29 mmol/L  ? Calcium 9.2 8.7 - 10.2 mg/dL  ? Total Protein 6.1 6.0 - 8.5 g/dL  ? Albumin 4.3 3.8 - 4.8 g/dL  ? Globulin, Total 1.8 1.5 - 4.5 g/dL  ? Albumin/Globulin Ratio 2.4 (H) 1.2 - 2.2  ? Bilirubin Total 0.3 0.0 - 1.2 mg/dL  ? Alkaline Phosphatase 46 44 - 121 IU/L  ? AST 20 0 - 40 IU/L  ? ALT 17 0 - 32 IU/L  ?TSH  ?Result Value Ref Range  ? TSH 1.390 0.450 - 4.500 uIU/mL  ?VITAMIN D 25 Hydroxy (Vit-D Deficiency, Fractures)  ?Result Value Ref Range  ? Vit D, 25-Hydroxy 23.5 (L) 30.0 - 100.0 ng/mL  ? ?   ?Assessment & Plan:  ? ?Problem List Items Addressed This Visit   ? ?  ? Other  ? Dizziness - Primary  ? Relevant Orders  ? MR Brain W Wo Contrast  ? Ambulatory referral to Physical Therapy  ?  ? ?Follow up plan: ?Return pending results. ? ? ? ? ? ?

## 2021-12-09 ENCOUNTER — Encounter: Payer: Self-pay | Admitting: Family Medicine

## 2021-12-13 ENCOUNTER — Ambulatory Visit: Payer: No Typology Code available for payment source | Admitting: Nurse Practitioner

## 2021-12-16 ENCOUNTER — Ambulatory Visit: Payer: Self-pay

## 2021-12-16 ENCOUNTER — Emergency Department (HOSPITAL_COMMUNITY): Payer: No Typology Code available for payment source

## 2021-12-16 ENCOUNTER — Encounter (HOSPITAL_COMMUNITY): Payer: Self-pay

## 2021-12-16 ENCOUNTER — Emergency Department (HOSPITAL_COMMUNITY)
Admission: EM | Admit: 2021-12-16 | Discharge: 2021-12-17 | Disposition: A | Discharge status: Home / Self Care | Payer: No Typology Code available for payment source | Attending: Emergency Medicine | Admitting: Emergency Medicine

## 2021-12-16 ENCOUNTER — Other Ambulatory Visit: Payer: Self-pay

## 2021-12-16 DIAGNOSIS — R1013 Epigastric pain: Secondary | ICD-10-CM | POA: Diagnosis present

## 2021-12-16 DIAGNOSIS — R1033 Periumbilical pain: Secondary | ICD-10-CM | POA: Insufficient documentation

## 2021-12-16 DIAGNOSIS — N9489 Other specified conditions associated with female genital organs and menstrual cycle: Secondary | ICD-10-CM | POA: Diagnosis not present

## 2021-12-16 DIAGNOSIS — R1010 Upper abdominal pain, unspecified: Secondary | ICD-10-CM

## 2021-12-16 HISTORY — DX: Anxiety disorder, unspecified: F41.9

## 2021-12-16 HISTORY — DX: Iron deficiency: E61.1

## 2021-12-16 HISTORY — DX: Anemia, unspecified: D64.9

## 2021-12-16 LAB — URINALYSIS, ROUTINE W REFLEX MICROSCOPIC
Bilirubin Urine: NEGATIVE
Glucose, UA: NEGATIVE mg/dL
Hgb urine dipstick: NEGATIVE
Ketones, ur: 5 mg/dL — AB
Leukocytes,Ua: NEGATIVE
Nitrite: NEGATIVE
Protein, ur: NEGATIVE mg/dL
Specific Gravity, Urine: 1.012 (ref 1.005–1.030)
pH: 7 (ref 5.0–8.0)

## 2021-12-16 LAB — COMPREHENSIVE METABOLIC PANEL
ALT: 22 U/L (ref 0–44)
AST: 21 U/L (ref 15–41)
Albumin: 4.6 g/dL (ref 3.5–5.0)
Alkaline Phosphatase: 41 U/L (ref 38–126)
Anion gap: 7 (ref 5–15)
BUN: 14 mg/dL (ref 6–20)
CO2: 25 mmol/L (ref 22–32)
Calcium: 9.3 mg/dL (ref 8.9–10.3)
Chloride: 105 mmol/L (ref 98–111)
Creatinine, Ser: 0.8 mg/dL (ref 0.44–1.00)
GFR, Estimated: >60 mL/min (ref >60)
Glucose, Bld: 80 mg/dL (ref 70–99)
Potassium: 4.1 mmol/L (ref 3.5–5.1)
Sodium: 137 mmol/L (ref 135–145)
Total Bilirubin: 0.4 mg/dL (ref 0.3–1.2)
Total Protein: 7.3 g/dL (ref 6.5–8.1)

## 2021-12-16 LAB — CBC WITH DIFFERENTIAL/PLATELET
Abs Immature Granulocytes: 0.01 10*3/uL (ref 0.00–0.07)
Basophils Absolute: 0.1 10*3/uL (ref 0.0–0.1)
Basophils Relative: 1 %
Eosinophils Absolute: 0.1 10*3/uL (ref 0.0–0.5)
Eosinophils Relative: 1 %
HCT: 40 % (ref 36.0–46.0)
Hemoglobin: 13.4 g/dL (ref 12.0–15.0)
Immature Granulocytes: 0 %
Lymphocytes Relative: 32 %
Lymphs Abs: 2.7 10*3/uL (ref 0.7–4.0)
MCH: 32.1 pg (ref 26.0–34.0)
MCHC: 33.5 g/dL (ref 30.0–36.0)
MCV: 95.9 fL (ref 80.0–100.0)
Monocytes Absolute: 0.7 10*3/uL (ref 0.1–1.0)
Monocytes Relative: 8 %
Neutro Abs: 5 10*3/uL (ref 1.7–7.7)
Neutrophils Relative %: 58 %
Platelets: 193 10*3/uL (ref 150–400)
RBC: 4.17 MIL/uL (ref 3.87–5.11)
RDW: 13.1 % (ref 11.5–15.5)
WBC: 8.6 10*3/uL (ref 4.0–10.5)
nRBC: 0 % (ref 0.0–0.2)

## 2021-12-16 LAB — LIPASE, BLOOD: Lipase: 45 U/L (ref 11–51)

## 2021-12-16 NOTE — ED Provider Triage Note (Signed)
Emergency Medicine Provider Triage Evaluation Note ? ?Belinda [name] , a 46 y.o. female  was evaluated in triage.  Pt complains of middle abdominal pain x few weeks. Tuesday and tonight, felt the worst pain of her life " like someone is squeezing her insides". Has been taking aloe juice and pepto but no improvement in symptoms. Sharp pain that on/off but then dissipates. Saw some "saliva with blood in it"?  ? ?Review of Systems  ?Positive: Abdominal pain, nausea ?Negative: Vomiting, chest pain, shortness of breath ? ?Physical Exam  ?BP 122/69 (BP Location: Left Arm)   Pulse 77   Temp 98.3 ?F (36.8 ?C) (Oral)   Resp 18   Ht 5\' 2"  (1.575 m)   Wt 60.8 kg   LMP 10/29/2021 (Approximate)   SpO2 98%   BMI 24.51 kg/m?  ?Gen:   Awake, no distress   ?Resp:  Normal effort  ?MSK:   Moves extremities without difficulty  ?Other:   ? ?Medical Decision Making  ?Medically screening exam initiated at 7:18 PM.  Appropriate orders placed.  FIONNUALA SHAHBAZ was informed that the remainder of the evaluation will be completed by another provider, this initial triage assessment does not replace that evaluation, and the importance of remaining in the ED until their evaluation is complete. ? ? ?  ?Janeece Fitting, PA-C ?12/16/21 1920 ? ?

## 2021-12-16 NOTE — ED Provider Notes (Signed)
[facility] [place] [facility]EMERGENCY DEPT Provider Note   CSN: [id number] Arrival date & time: 12/16/21  1837     History  Chief Complaint  Patient presents with   Abdominal Pain    Belinda [name] is a 46 y.o. female.  The history is provided by the patient.  Abdominal Pain She has history of diabetes, GERD, peptic ulcer, status post bariatric surgery and comes in because of upper abdominal pain for the last 2 weeks.  The pain can be anywhere from the periumbilical area to the epigastric area but has not been present in the lower abdomen.  Pain is intermittent.  The pain can be present for several minutes before resolving.  It occasionally radiates to the back.  Over the last 6 days, pain has been severe.  She does have some nausea and she belches but does not vomit.  She states that she has brought up some pinkish saliva and occasionally blood-streaked.  She denies constipation or diarrhea.  For about the last 5 weeks, she has had approximately a 20 pound involuntary weight loss.  Her primary care provider did some tests and found that she was low on vitamin D, vitamin B, iron and she has been given supplements without any improvement.  She has been taking Pepto-Bismol which gives slight, temporary relief.  She is scheduled to see a gastroenterologist in 1 week.   Home Medications Prior to Admission medications   Medication Sig Start Date End Date Taking? Authorizing Provider  B-D 3CC LUER-LOK SYR 25GX1" 25G X 1" 3 ML MISC  02/27/16   [provider]  cyanocobalamin (,VITAMIN B-12,) 1000 MCG/ML injection Inject 1 mL (1,000 mcg total) into the muscle once a week. 05/02/21   Johnson, Megan P, DO  lidocaine (LIDODERM) 5 % Place 1 patch onto the skin daily. Remove & Discard patch within 12 hours or as directed by MD 10/14/17   Particia Nearing, PA-C  ondansetron (ZOFRAN) 4 MG tablet Take 1 tablet (4 mg total) by mouth every 8 (eight) hours as needed. for nausea 05/02/21    Olevia Perches P, DO  Probiotic Product ([employer] MOOD SUPPORT PROBIOTIC PO) Take by mouth daily.    [provider]  Vitamin D, Ergocalciferol, (DRISDOL) 1.25 MG (50000 UNIT) CAPS capsule Take 1 capsule (50,000 Units total) by mouth every 7 (seven) days. 11/25/21   Olevia Perches P, DO      Allergies    Acetaminophen, Valproic acid, and Depakote [divalproex sodium]    Review of Systems   Review of Systems  Gastrointestinal:  Positive for abdominal pain.  All other systems reviewed and are negative.  Physical Exam Updated Vital Signs BP 100/64   Pulse 65   Temp 98.3 F (36.8 C) (Oral)   Resp 16   Ht 5\' 2"  (1.575 m)   Wt 60.8 kg   LMP 10/29/2021 (Approximate)   SpO2 100%   BMI 24.51 kg/m  Physical Exam Vitals and nursing note reviewed.  46 year old female, resting comfortably and in no acute distress. Vital signs are normal. Oxygen saturation is 100%, which is normal. Head is normocephalic and atraumatic. PERRLA, EOMI. Oropharynx is clear. Neck is nontender and supple without adenopathy or JVD. Back is nontender and there is no CVA tenderness. Lungs are clear without rales, wheezes, or rhonchi. Chest is nontender. Heart has regular rate and rhythm without murmur. Abdomen is soft, flat, with mild epigastric tenderness.  There is no rebound or guarding.  There are no masses  or hepatosplenomegaly and peristalsis is normoactive. Extremities have no cyanosis or edema, full range of motion is present. Skin is warm and dry without rash. Neurologic: Mental status is normal, cranial nerves are intact, moves all extremities equally.  ED Results / Procedures / Treatments   Labs (all labs ordered are listed, but only abnormal results are displayed) Labs Reviewed  URINALYSIS, ROUTINE W REFLEX MICROSCOPIC - Abnormal; Notable for the following components:      Result Value   Ketones, ur 5 (*)    All other components within normal limits  CBC WITH DIFFERENTIAL/PLATELET   COMPREHENSIVE METABOLIC PANEL  LIPASE, BLOOD   Radiology CT ABDOMEN PELVIS W CONTRAST  Result Date: 12/17/2021 CLINICAL DATA:  Left upper quadrant pain EXAM: CT ABDOMEN AND PELVIS WITH CONTRAST TECHNIQUE: Multidetector CT imaging of the abdomen and pelvis was performed using the standard protocol following bolus administration of intravenous contrast. RADIATION DOSE REDUCTION: This exam was performed according to the departmental dose-optimization program which includes automated exposure control, adjustment of the mA and/or kV according to patient size and/or use of iterative reconstruction technique. CONTRAST:  OMNIPAQUE IOHEXOL 300 MG/ML  SOLN COMPARISON:  None. FINDINGS: Lower chest: No acute abnormality Hepatobiliary: No focal hepatic abnormality. Gallbladder unremarkable. Pancreas: No focal abnormality or ductal dilatation. Spleen: No focal abnormality.  Normal size. Adrenals/Urinary Tract: No adrenal abnormality. No focal renal abnormality. No stones or hydronephrosis. Urinary bladder is unremarkable. Stomach/Bowel: Prior gastric bypass. No evidence of bowel obstruction. Vascular/Lymphatic: No evidence of aneurysm or adenopathy. Reproductive: Uterus and adnexa unremarkable.  No mass. Other: No free fluid or free air. Musculoskeletal: No acute bony abnormality. IMPRESSION: Prior gastric bypass. No acute findings in the abdomen or pelvis. Electronically Signed   By: Charlett Nose M.D.   On: 12/17/2021 00:25    Procedures Procedures    Medications Ordered in ED Medications - No data to display  ED Course/ Medical Decision Making/ A&P                           Medical Decision Making Amount and/or Complexity of Data Reviewed Labs: ordered. Radiology: ordered.  Risk Prescription drug management.   Abdominal pain of uncertain cause and patient status post bariatric surgery.  Pattern of pain is suggestive of peptic ulcer disease, but consider GERD, diverticulitis, cholecystitis.   Screening labs were obtained at triage and are unremarkable.  Old records were reviewed showing recent laboratory work-up where iron level was normal but iron saturation was low, vitamin D levels were borderline low.  Patient probably would benefit from being on a proton pump inhibitor.  She is already scheduled to see a gastroenterologist.  We will get CT of abdomen and pelvis to rule out more severe pathology.  CT scan shows no acute process.  I have independently viewed the images, and agree with radiologist interpretation.  Patient is discharged with a prescription for pantoprazole, initial dose given in the emergency department.  She is to keep her scheduled follow-up appointment with gastroenterology next week.  Return precautions discussed.        Final Clinical Impression(s) / ED Diagnoses Final diagnoses:  Upper abdominal pain    Rx / DC Orders ED Discharge Orders          Ordered    pantoprazole (PROTONIX) 40 MG tablet  Daily        12/17/21 0041              Preston Fleeting,  Onalee Hua, MD 12/17/21 [phone]

## 2021-12-16 NOTE — ED Triage Notes (Signed)
Patient c/o intermittent mid abdominal pain x 2 weeks. Patient states 6 days she had intense mid abdominal pain that lasted approx 45 mins. Patient states that she had that same pain again today. Patient also reports that she had clear and small amounts of pink saliva and then pink streaks in the saliva today. ?

## 2021-12-16 NOTE — Telephone Encounter (Signed)
?  Chief Complaint: abdominal pain ?Symptoms: abdominal pain, nausea, saliva has blood present ?Frequency: bleeding today, abdominal pain since last week but has came and gone and gets worse at times ?Pertinent Negatives: Patient denies diarrhea, constipation or vomiting ?Disposition: [x] ED /[] Urgent Care (no appt availability in office) / [] Appointment(In office/virtual)/ []  [place] [facility]/ [] Home Care/ [] Refused Recommended Disposition /[]  Mobile Bus/ []  Follow-up with PCP ?Additional Notes: Pt states the pain has gotten worse again today and became nauseated and has anemia and has hx of ulcers but didn't think this pain was from an ulcer.  ? ? ?Reason for Disposition ? [1] SEVERE pain (e.g., excruciating) AND [2] present > 1 hour ? ?Answer Assessment - Initial Assessment Questions ?1. LOCATION: "Where does it hurt?"  ?    Middle of abdomen right below belly button ?2. RADIATION: "Does the pain shoot anywhere else?" (e.g., chest, back) ?    No ?3. ONSET: "When did the pain begin?" (e.g., minutes, hours or days ago)  ?    Last week but went away, since but gotten slight better but now worse  ?4. SUDDEN: "Gradual or sudden onset?" ?    Suddenly  ?5. PATTERN "Does the pain come and go, or is it constant?" ?   - If constant: "Is it getting better, staying the same, or worsening?"  ?    (Note: Constant means the pain never goes away completely; most serious pain is constant and it progresses)  ?   - If intermittent: "How long does it last?" "Do you have pain now?" ?    (Note: Intermittent means the pain goes away completely between bouts) ?    constant ?6. SEVERITY: "How bad is the pain?"  (e.g., Scale 1-10; mild, moderate, or severe) ?  - MILD (1-3): doesn't interfere with normal activities, abdomen soft and not tender to touch  ?  - MODERATE (4-7): interferes with normal activities or awakens from sleep, abdomen tender to touch  ?  - SEVERE (8-10): excruciating pain, doubled over, unable to do any  normal activities  ?    7 ?10. OTHER SYMPTOMS: "Do you have any other symptoms?" (e.g., back pain, diarrhea, fever, urination pain, vomiting) ?      Drooling with blood present ? ?Protocols used: Abdominal Pain - Female-A-AH ? ?

## 2021-12-17 ENCOUNTER — Emergency Department (HOSPITAL_COMMUNITY): Payer: No Typology Code available for payment source

## 2021-12-17 LAB — HCG, QUANTITATIVE, PREGNANCY: hCG, Beta Chain, Quant, S: <1 m[IU]/mL (ref <5)

## 2021-12-17 IMAGING — CT CT ABD-PELV W/ CM
2 of 5 series · 16 of 46 positions shown, 18 images · IV contrast (agent unspecified)
Comparison: None.

CLINICAL DATA: Left upper quadrant pain

EXAM:
CT ABDOMEN AND PELVIS WITH CONTRAST
TECHNIQUE: Multidetector CT imaging of the abdomen and pelvis was performed
using the standard protocol following bolus administration of
intravenous contrast.

[Series 2: axial st · axial · 0.73mm/px · z∈[-466,-61]mm · 13 of 95 slices shown, 15 images]
[im 7/95  soft-tissue]
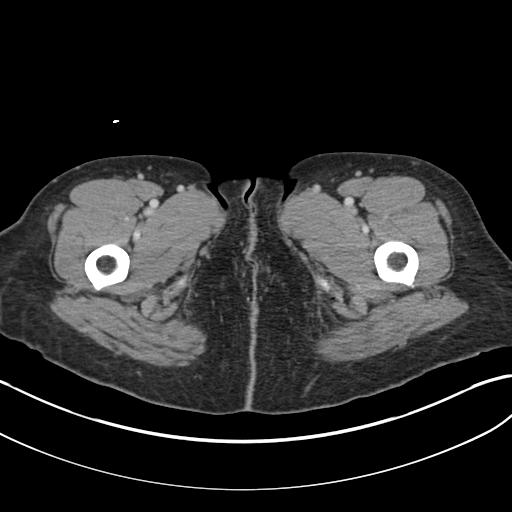
[im 7/95  bone]
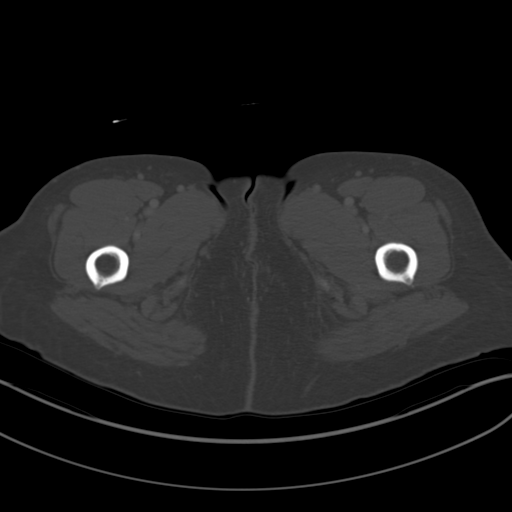
[im 14/95  soft-tissue]
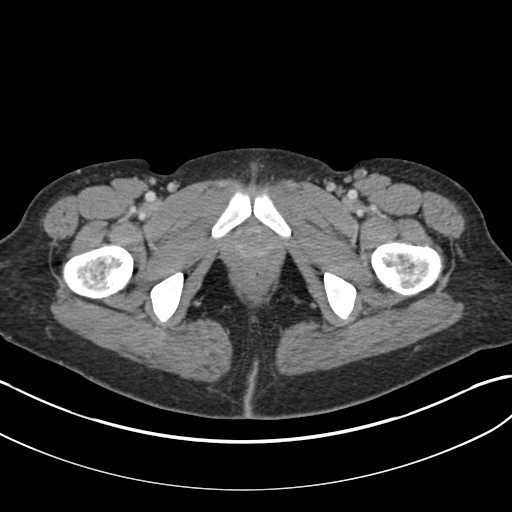
[im 21/95  soft-tissue]
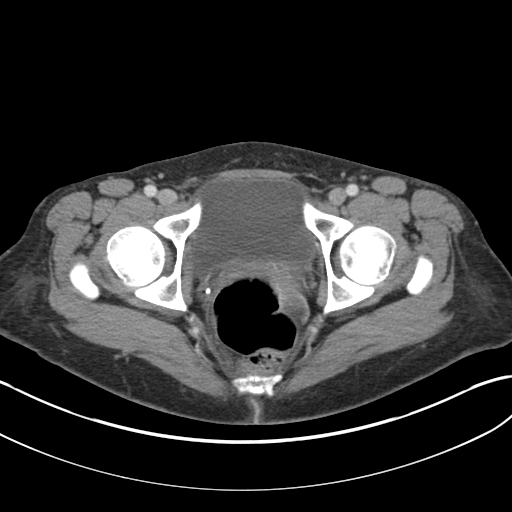
[im 27/95  soft-tissue]
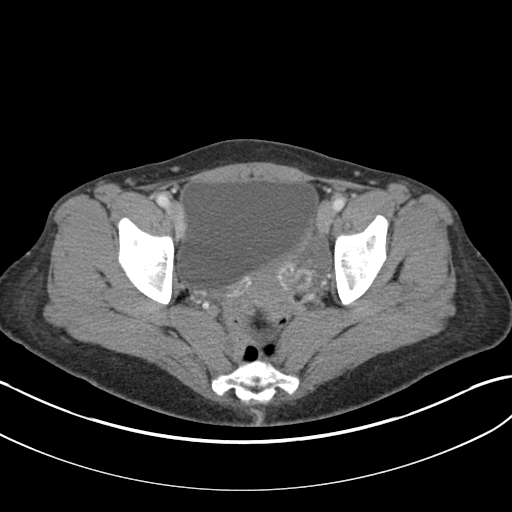
[im 34/95  soft-tissue]
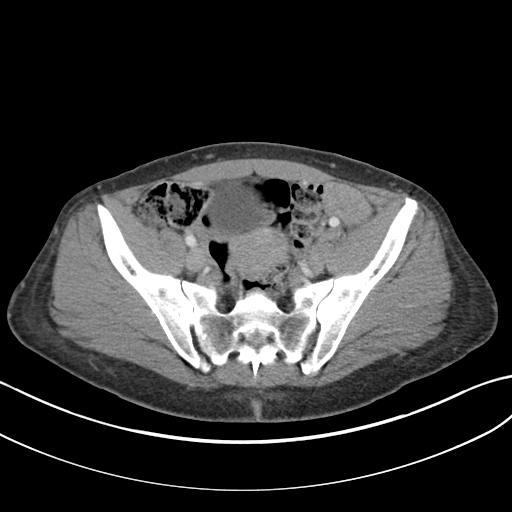
[im 41/95  soft-tissue]
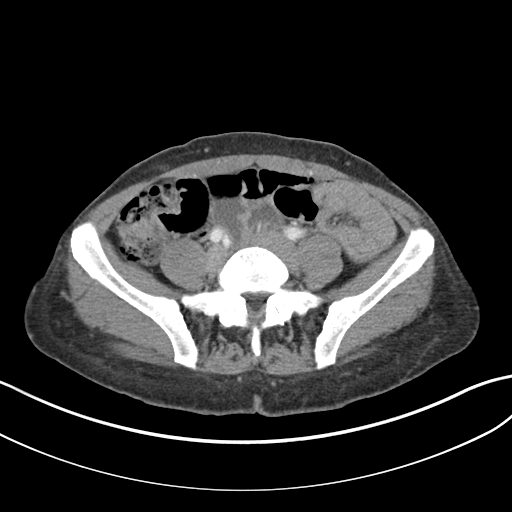
[im 48/95  soft-tissue]
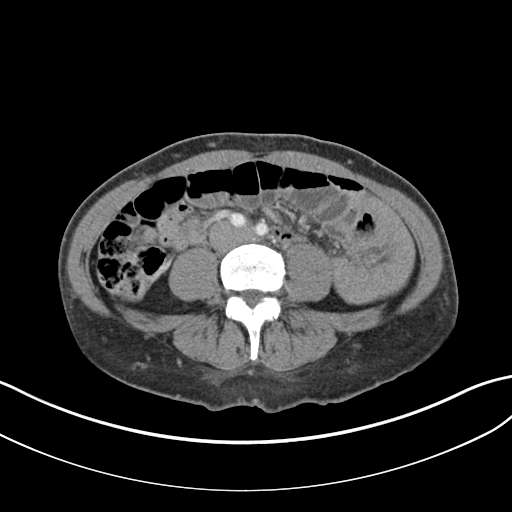
[im 54/95  soft-tissue]
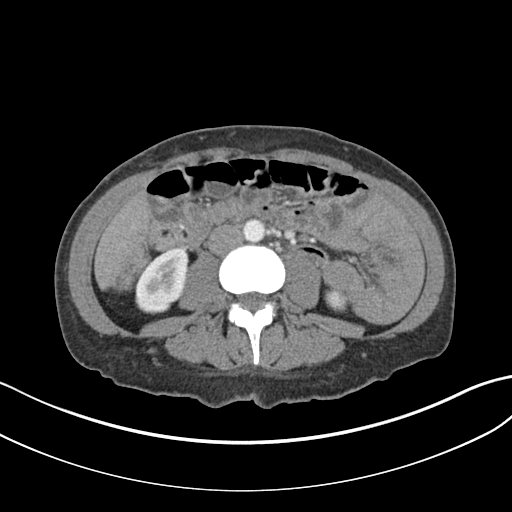
[im 61/95  soft-tissue]
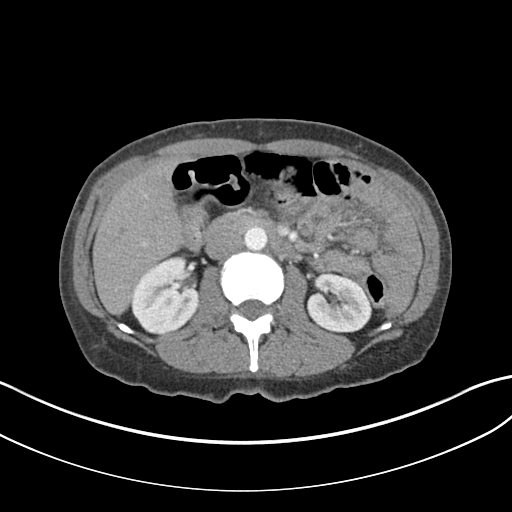
[im 61/95  bone]
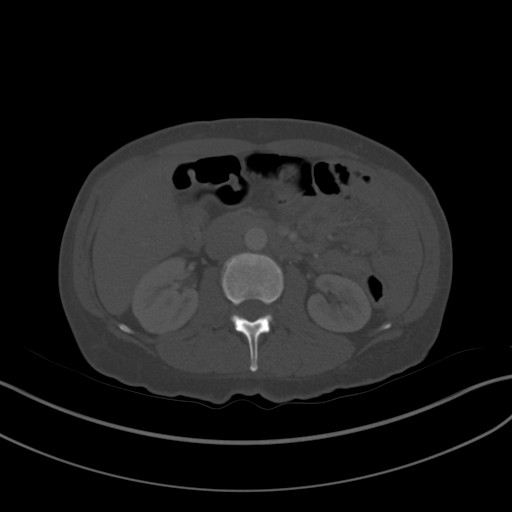
[im 68/95  soft-tissue]
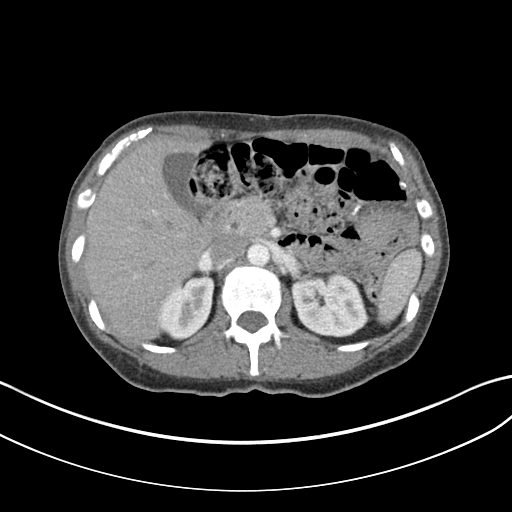
[im 74/95  soft-tissue]
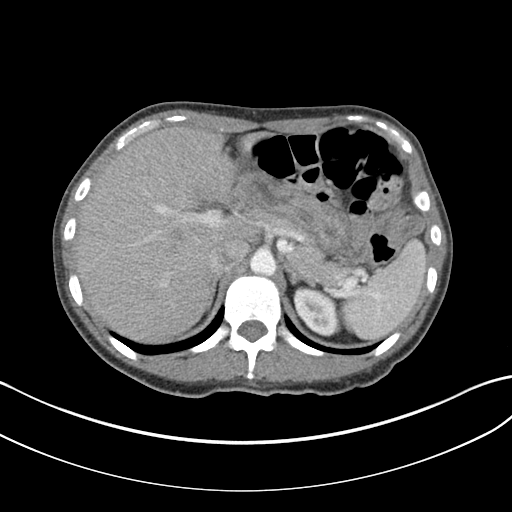
[im 81/95  soft-tissue]
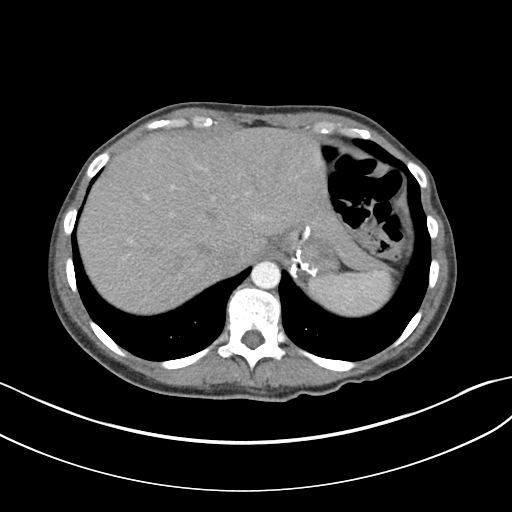
[im 88/95  soft-tissue]
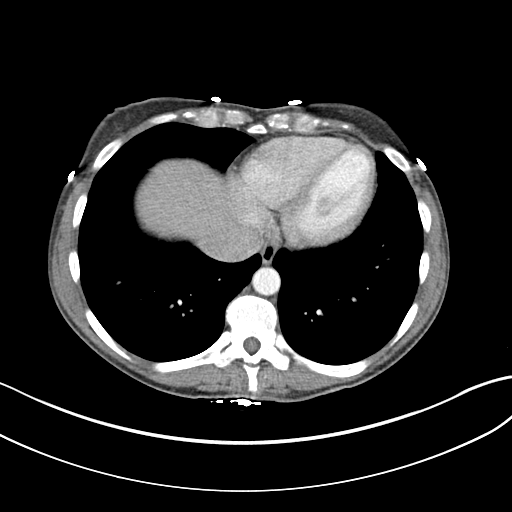

[Series 4: coronal st · coronal · 0.73mm/px · 3 of 115 slices shown]
[im 39/115  soft-tissue]
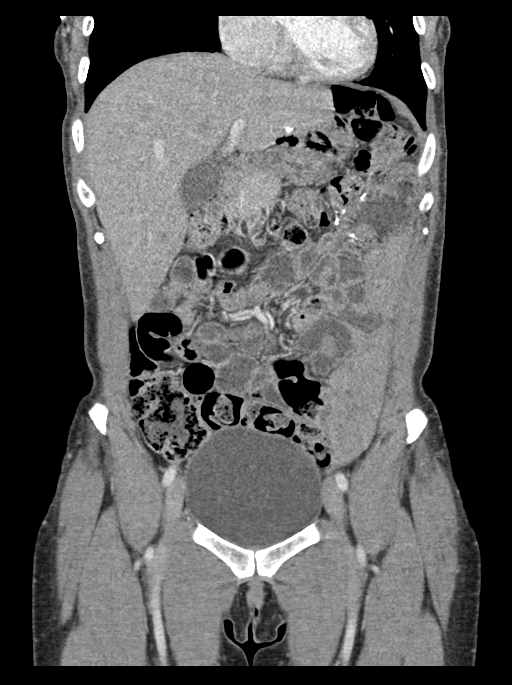
[im 51/115  soft-tissue]
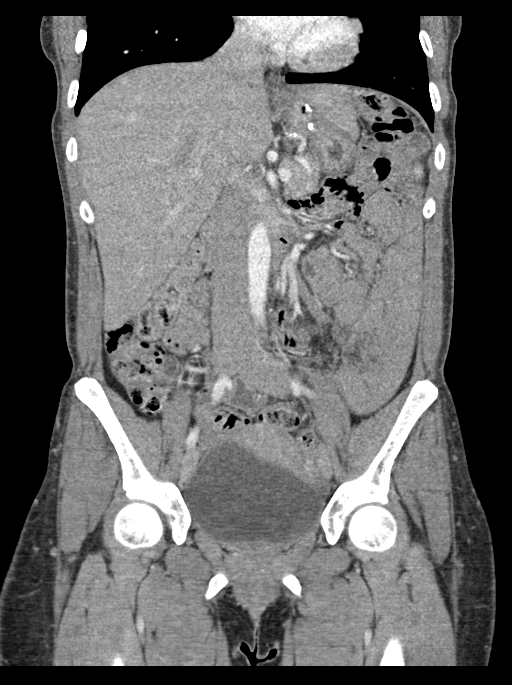
[im 64/115  soft-tissue]
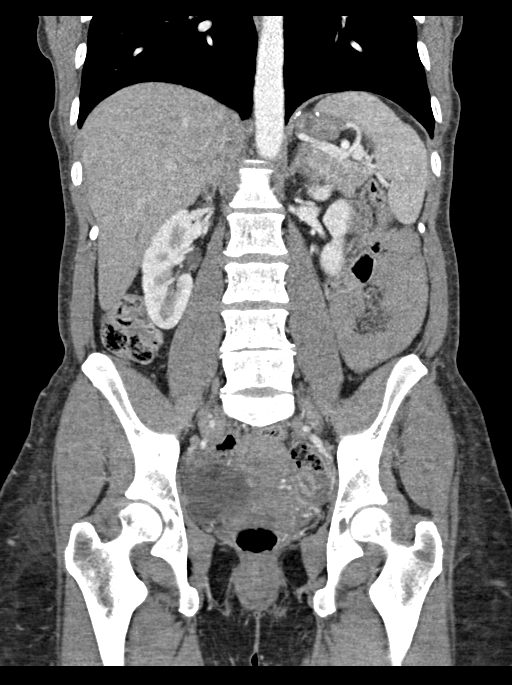

[16 of 46 positions shown; findings below may reference images not displayed]

RADIATION DOSE REDUCTION: This exam was performed according to the
departmental dose-optimization program which includes automated
exposure control, adjustment of the mA and/or kV according to
patient size and/or use of iterative reconstruction technique.

CONTRAST:  100mL OMNIPAQUE IOHEXOL 300 MG/ML  SOLN
FINDINGS: Lower chest: No acute abnormality

Hepatobiliary: No focal hepatic abnormality. Gallbladder
unremarkable.

Pancreas: No focal abnormality or ductal dilatation.

Spleen: No focal abnormality.  Normal size.

Adrenals/Urinary Tract: No adrenal abnormality. No focal renal
abnormality. No stones or hydronephrosis. Urinary bladder is
unremarkable.

Stomach/Bowel: Prior gastric bypass. No evidence of bowel
obstruction.

Vascular/Lymphatic: No evidence of aneurysm or adenopathy.

Reproductive: Uterus and adnexa unremarkable.  No mass.

Other: No free fluid or free air.

Musculoskeletal: No acute bony abnormality.
IMPRESSION: Prior gastric bypass.

No acute findings in the abdomen or pelvis.

## 2021-12-17 MED ORDER — IOHEXOL 300 MG/ML  SOLN
100.0000 mL | Freq: Once | INTRAMUSCULAR | Status: AC | PRN
  Administered 2021-12-17: 100 mL via INTRAVENOUS

## 2021-12-17 MED ORDER — PANTOPRAZOLE SODIUM 40 MG PO TBEC: 40.0000 mg | DELAYED_RELEASE_TABLET | Freq: Every day | ORAL | 0 refills | Status: DC

## 2021-12-17 MED ORDER — PANTOPRAZOLE SODIUM 40 MG PO TBEC
40.0000 mg | DELAYED_RELEASE_TABLET | Freq: Once | ORAL | Status: AC
  Administered 2021-12-17: 40 mg via ORAL
  Filled 2021-12-17: qty 1

## 2021-12-17 NOTE — Discharge Instructions (Signed)
Take Pepto Bismol as needed. ? ?Return if symptoms are getting worse. ? ?Keep your appointment with the gastroenterologist. ?

## 2021-12-19 ENCOUNTER — Other Ambulatory Visit: Payer: Self-pay

## 2021-12-19 ENCOUNTER — Ambulatory Visit
Admission: RE | Admit: 2021-12-19 | Discharge: 2021-12-19 | Disposition: A | Discharge status: Home / Self Care | Payer: No Typology Code available for payment source | Source: Ambulatory Visit | Attending: Family Medicine | Admitting: Family Medicine

## 2021-12-19 IMAGING — MR MR HEAD WO/W CM
11 series · 48 of 48 positions shown · IV contrast (multihance)
Comparison: Head CT 08/21/2017.

CLINICAL DATA: Nonspecific dizziness. Headaches with ringing in
ears and vertigo.

EXAM:
MRI HEAD WITHOUT AND WITH CONTRAST
TECHNIQUE: Multiplanar, multiecho pulse sequences of the brain and surrounding
structures were obtained without and with intravenous contrast.
CONTRAST:  12mL MULTIHANCE GADOBENATE DIMEGLUMINE 529 MG/ML IV SOLN

[Series 6: DWI · axial · 3.0mm · 0.94mm/px · z∈[-79,+72]mm · 10 of 172 slices shown]
[im 1/172]
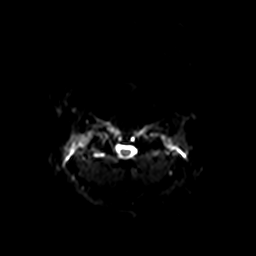
[im 20/172]
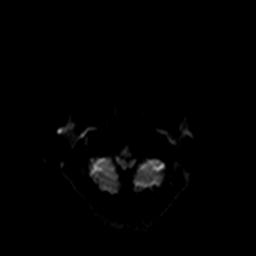
[im 39/172]
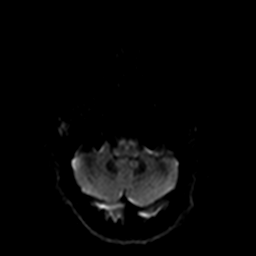
[im 58/172]
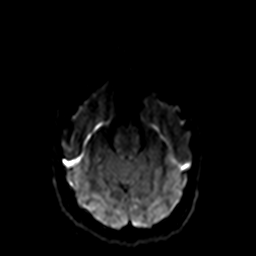
[im 77/172]
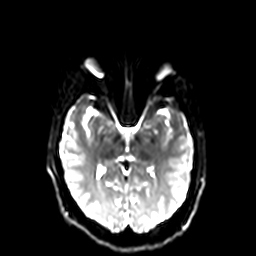
[im 96/172]
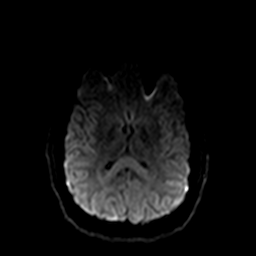
[im 115/172]
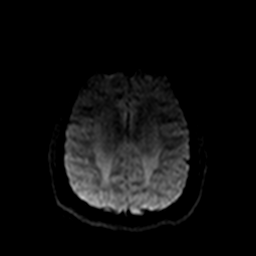
[im 134/172]
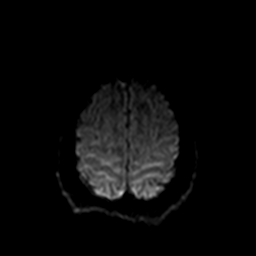
[im 153/172]
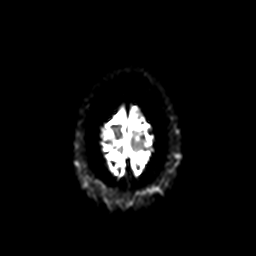
[im 172/172]
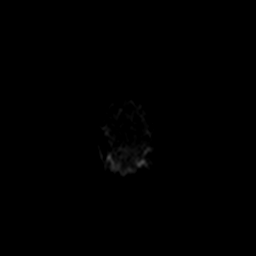

[Series 7: ax dwi_tracew · axial · 3.0mm · 0.94mm/px · z∈[-79,+72]mm · 5 of 86 slices shown]
[im 1/86]
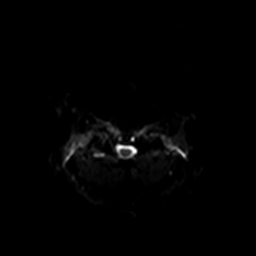
[im 22/86]
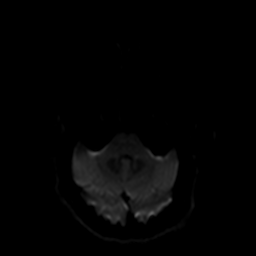
[im 43/86]
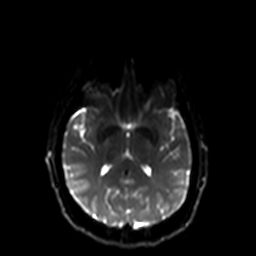
[im 64/86]
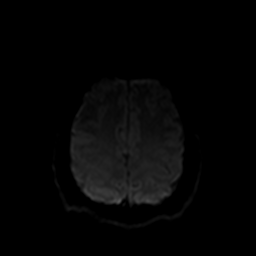
[im 86/86]
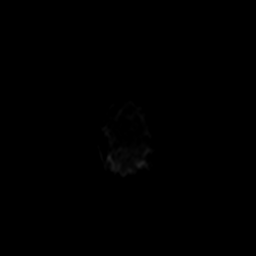

[Series 8: ax dwi_adc · axial · 3.0mm · 0.94mm/px · z∈[-79,+72]mm · 2 of 43 slices shown]
[im 1/43]
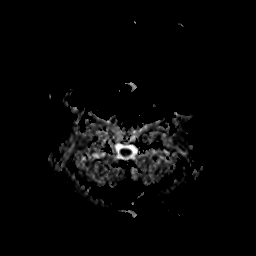
[im 43/43]
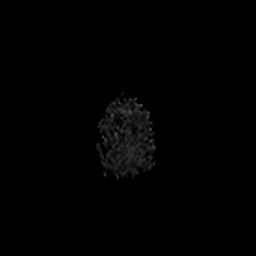

[Series 10: FLAIR · axial · 3.0mm · 0.72mm/px · z∈[-80,+70]mm · 2 of 26 slices shown]
[im 1/26]
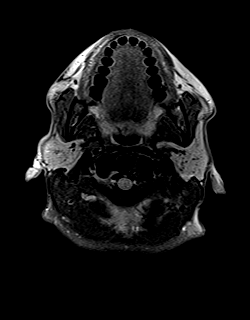
[im 26/26]
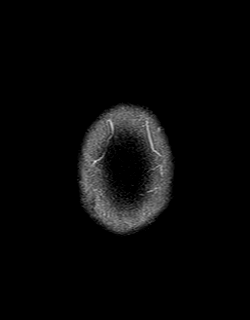

[Series 11: T2 · axial · 4.0mm · 0.36mm/px · z∈[-80,+70]mm · 2 of 30 slices shown]
[im 1/30]
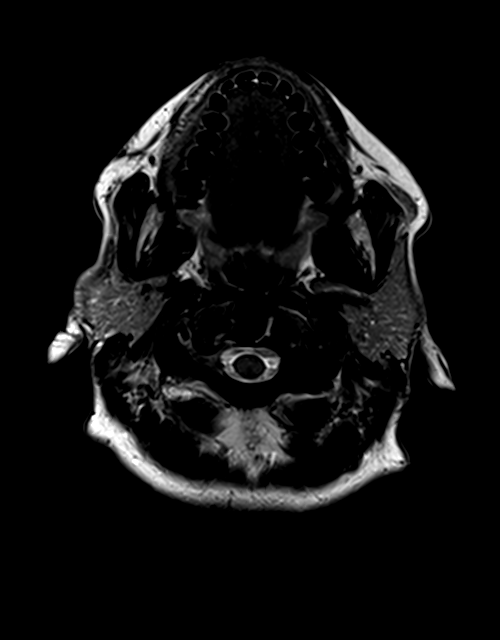
[im 30/30]
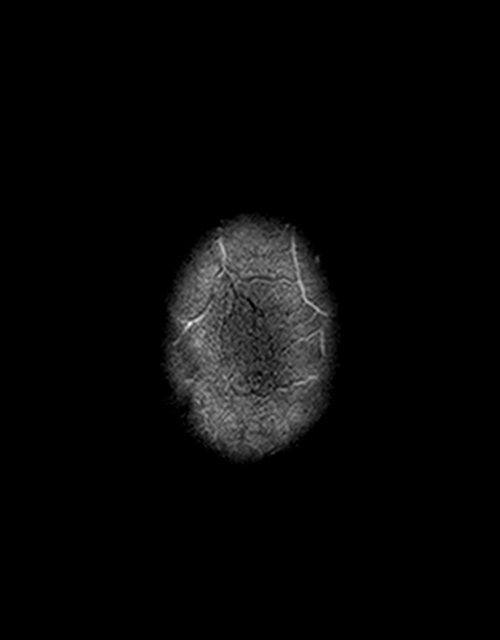

[Series 13: swi_images · axial · 3.0mm · 0.90mm/px · z∈[-81,+72]mm · 3 of 52 slices shown]
[im 1/52]
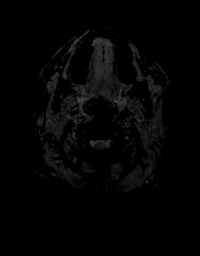
[im 26/52]
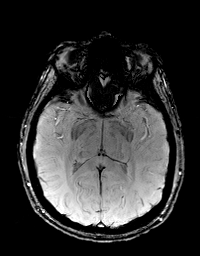
[im 52/52]
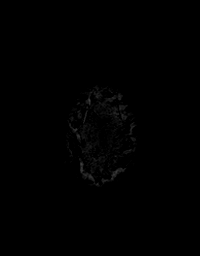

[Series 14: T1 · sagittal · 4.0mm · 0.75mm/px · 2 of 31 slices shown (1 of 3)]
[im 1/31]
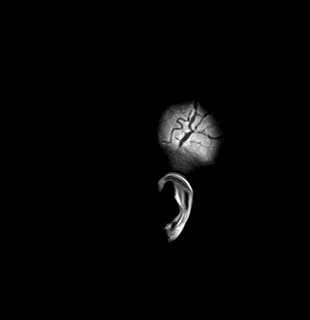
[im 31/31]
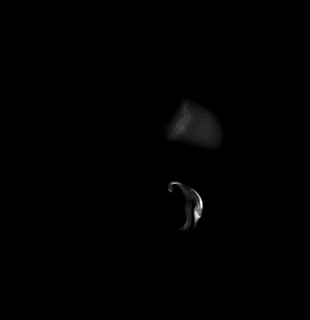

[Series 15: T1 · axial · 1.0mm · 0.90mm/px · z∈[-84,+75]mm · 9 of 160 slices shown (2 of 3)]
[im 1/160]
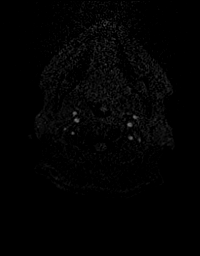
[im 20/160]
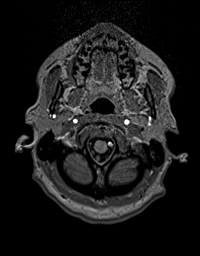
[im 40/160]
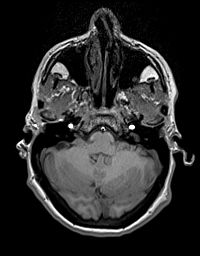
[im 60/160]
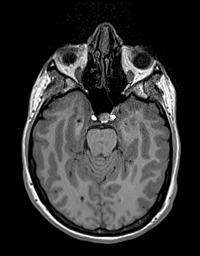
[im 80/160]
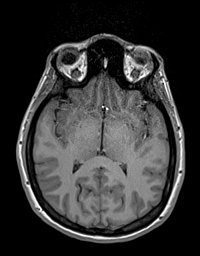
[im 100/160]
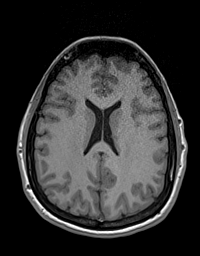
[im 120/160]
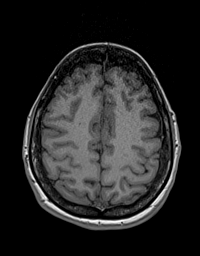
[im 140/160]
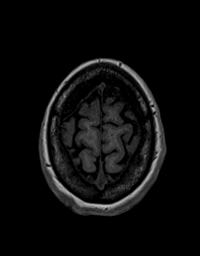
[im 160/160]
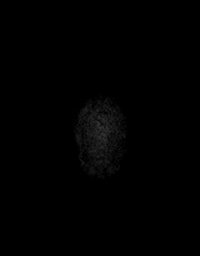

[Series 16: T2 post-contrast · coronal · 4.5mm · 0.36mm/px · 2 of 33 slices shown]
[im 1/33]
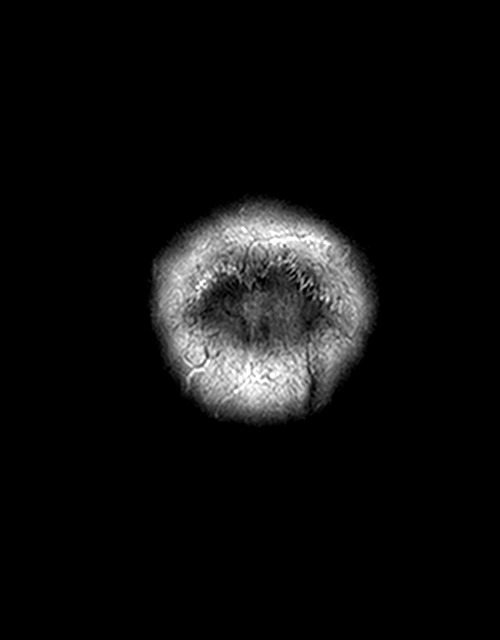
[im 33/33]
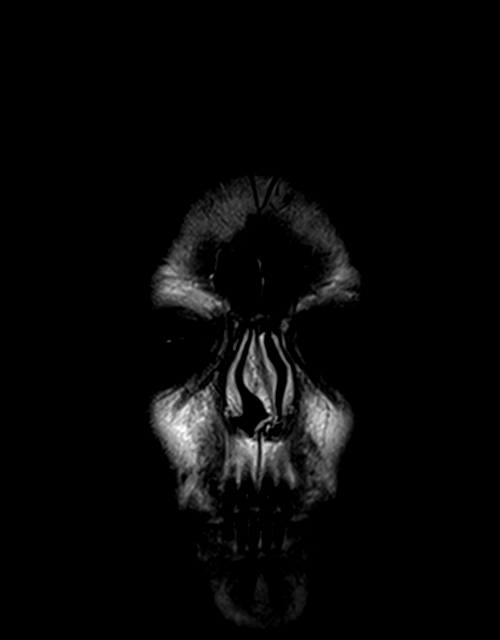

[Series 17: T1 · axial · 1.0mm · 0.90mm/px · z∈[-84,+75]mm · 9 of 160 slices shown (3 of 3)]
[im 1/160]
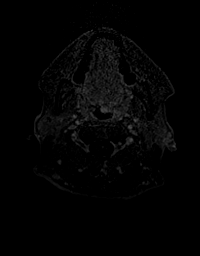
[im 20/160]
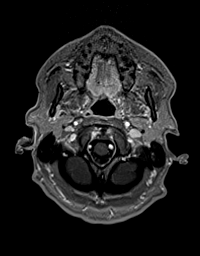
[im 40/160]
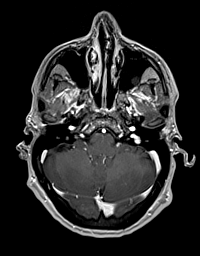
[im 60/160]
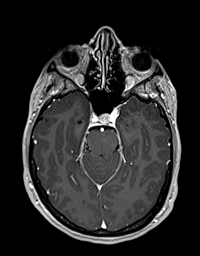
[im 80/160]
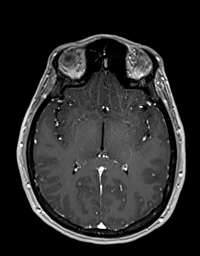
[im 100/160]
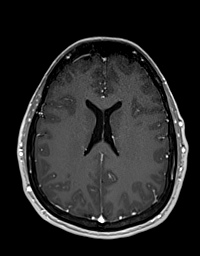
[im 120/160]
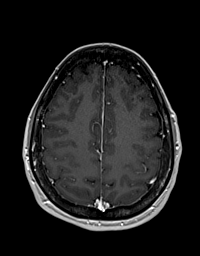
[im 140/160]
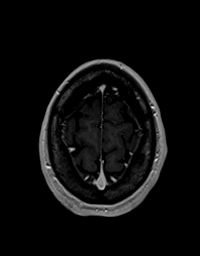
[im 160/160]
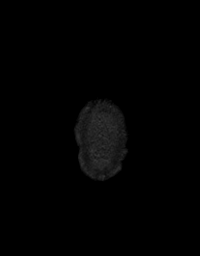

[Series 18: T1 post-contrast · coronal · 4.5mm · 0.72mm/px · 2 of 33 slices shown]
[im 1/33]
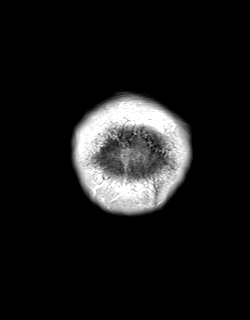
[im 33/33]
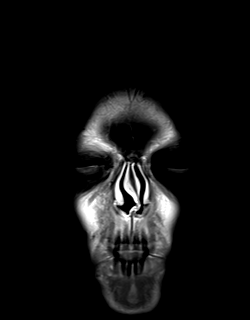

[48 of 48 positions shown; findings below may reference images not displayed]

FINDINGS: Brain: No infarction, hemorrhage, hydrocephalus, extra-axial
collection or mass lesion. No abnormal enhancement, white matter
disease, or brain atrophy.

Vascular: Normal flow voids and preserved vascular enhancements.
There is an anterior and medial outpouching from the left ICA at the
ophthalmic segment measuring 3-4 mm, see postcontrast and axial
noncontrast T1 weighted imaging.

An IAC protocol was not performed but still high-resolution imaging
of the temporal bones by volumetric postcontrast series.

Skull and upper cervical spine: Normal marrow signal

Sinuses/Orbits: Nonenhancing retention cyst in the left maxillary
sinus. Trace mucosal thickening elsewhere. Trace opacification of
mastoid air cells. Negative nasopharynx.

Other: Cyst posterior to the left mandibular head which could be
salivary or articular, only 5 mm and simple appearing.
IMPRESSION: 1. No explanation for dizziness.  Negative for retrocochlear lesion.
2. Suspect 3 to 4 mm left ICA aneurysm. Recommend MRA for
confirmation/follow-up purposes.

## 2021-12-19 MED ORDER — GADOBENATE DIMEGLUMINE 529 MG/ML IV SOLN
12.0000 mL | Freq: Once | INTRAVENOUS | Status: AC | PRN
  Administered 2021-12-19: 12 mL via INTRAVENOUS

## 2021-12-24 ENCOUNTER — Ambulatory Visit: Payer: No Typology Code available for payment source | Admitting: Gastroenterology

## 2021-12-24 ENCOUNTER — Other Ambulatory Visit: Payer: Self-pay

## 2021-12-24 ENCOUNTER — Telehealth: Payer: Self-pay | Admitting: Family Medicine

## 2021-12-24 ENCOUNTER — Encounter: Payer: Self-pay | Admitting: Gastroenterology

## 2021-12-24 ENCOUNTER — Other Ambulatory Visit: Payer: Self-pay | Admitting: Family Medicine

## 2021-12-24 VITALS — BP 110/69 | HR 57 | Temp 98.3°F | Ht 62.0 in | Wt 135.6 lb

## 2021-12-24 DIAGNOSIS — R634 Abnormal weight loss: Secondary | ICD-10-CM

## 2021-12-24 DIAGNOSIS — Z1211 Encounter for screening for malignant neoplasm of colon: Secondary | ICD-10-CM

## 2021-12-24 DIAGNOSIS — R42 Dizziness and giddiness: Secondary | ICD-10-CM

## 2021-12-24 DIAGNOSIS — Z800 Family history of malignant neoplasm of digestive organs: Secondary | ICD-10-CM

## 2021-12-24 DIAGNOSIS — Z791 Long term (current) use of non-steroidal anti-inflammatories (NSAID): Secondary | ICD-10-CM

## 2021-12-24 DIAGNOSIS — D508 Other iron deficiency anemias: Secondary | ICD-10-CM | POA: Diagnosis not present

## 2021-12-24 DIAGNOSIS — R9389 Abnormal findings on diagnostic imaging of other specified body structures: Secondary | ICD-10-CM

## 2021-12-24 DIAGNOSIS — G43911 Migraine, unspecified, intractable, with status migrainosus: Secondary | ICD-10-CM

## 2021-12-24 MED ORDER — PANTOPRAZOLE SODIUM 40 MG PO TBEC: 40.0000 mg | DELAYED_RELEASE_TABLET | Freq: Two times a day (BID) | ORAL | 3 refills | Status: DC

## 2021-12-24 NOTE — Telephone Encounter (Signed)
Pt made an additional call FYI ?

## 2021-12-24 NOTE — Telephone Encounter (Signed)
Please let her know that I'm sorry, I was out of the office last week and just returned. Her MRI did not show any reason for her dizziness. She did have a little "outpouching" of one of the arteries in her head so they recommended an additional picture to make sure there is not a small anyerusym. I'll get that ordered. I think since the MRI didn't give [place] an answer, we should get her into see the neurologist.  ?

## 2021-12-24 NOTE — Progress Notes (Signed)
?  ?Wyline Mood MD, MRCP(U.K) ?[address]  ?[address]  ?[place], [place] [address]  ?Main: [phone]  ?Fax: [phone] ? ? ?Gastroenterology Consultation ? ?Referring Provider:     Dorcas Carrow, DO ?Primary Care Physician:  Dorcas Carrow, DO ?Primary Gastroenterologist:  Dr. Wyline Mood  ?Reason for Consultation:     Iron deficiency  ?      ? HPI:   ?Belinda [name] is a 46 y.o. y/o female referred for consultation & management  by Dr. Laural Benes, Oralia Rud, DO.  ? ? ?She was previously seen by me back in 2018 for nausea and vomiting . H/o Roux en Y bypass . 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 Due to persistent dysphagia in 11/2016 I empirically dilated her esophagus to 15 mm.  ? ?Recent labs in 11/2021 showed a low ferritin of 9 .  No recent colonoscopy. B12 320.  ?She states that for the past few months she has been suffering for headaches and since then has been using Advil's and BC powders on a almost daily basis.  She has lost over 20 pounds of weight unintentionally.  Epigastric pain on and off ongoing for the past few weeks.  She still has a gallbladder in place.  Was commenced recently on Protonix unsure of the strength she takes it once a day.  Denies any change in bowel habits.  Her father had rectal cancer.  No prior colonoscopy.  Denies any nausea or vomiting.  She has been receiving IV iron. ? ? ?Past Medical History:  ?Diagnosis Date  ? Anemia   ? Anxiety   ? Arthritis   ? lower back  ? Bipolar affective disorder (HCC)   ? Controlled substance agreement signed 10/16/2017  ? Deviated septum   ? Diabetes mellitus without complication (HCC)   ? GERD (gastroesophageal reflux disease)   ? Headache(784.0)   ? migraines - none 2 months  ? Low iron   ? Peptic ulcer   ? Pre-diabetes   ? Schizophrenia (HCC)   ? Sleep apnea   ? Substance abuse (HCC)   ? ? ?Past Surgical History:  ?Procedure Laterality Date  ? ESOPHAGOGASTRODUODENOSCOPY  08/05/2012   ? Procedure: ESOPHAGOGASTRODUODENOSCOPY (EGD);  Surgeon: Kandis Cocking, MD;  Location: [facility];  Service: General;  Laterality: N/A;  ? ESOPHAGOGASTRODUODENOSCOPY (EGD) WITH PROPOFOL N/A 08/06/2016  ? Procedure: ESOPHAGOGASTRODUODENOSCOPY (EGD) WITH PROPOFOL;  Surgeon: Wyline Mood, MD;  Location: [facility];  Service: Endoscopy;  Laterality: N/A;  ? ESOPHAGOGASTRODUODENOSCOPY (EGD) WITH PROPOFOL N/A 11/07/2016  ? Procedure: ESOPHAGOGASTRODUODENOSCOPY (EGD) WITH PROPOFOL;  Surgeon: Wyline Mood, MD;  Location: [facility];  Service: Endoscopy;  Laterality: N/A;  ? 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 Procedure: LAPAROSCOPIC ROUX-EN-Y GASTRIC;  Surgeon: Mariella Saa, MD;  Location: [facility];  Service: General;  Laterality: N/A;  upper endoscopy  ? SEPTOPLASTY  2003  ? ? ?Prior to Admission medications   ?Medication Sig Start Date End Date Taking? Authorizing Provider  ?B-D 3CC LUER-LOK SYR 25GX1" 25G X 1" 3 ML MISC  02/27/16   [provider]  ?cyanocobalamin (,VITAMIN B-12,) 1000 MCG/ML injection Inject 1 mL (1,000 mcg total) into the muscle once a week. 05/02/21   Olevia Perches P, DO  ?lidocaine (LIDODERM) 5 % Place 1 patch onto the skin daily. Remove & Discard patch within 12 hours or as directed by MD 10/14/17   Particia Nearing, PA-C  ?  ondansetron (ZOFRAN) 4 MG tablet Take 1 tablet (4 mg total) by mouth every 8 (eight) hours as needed. for nausea 05/02/21   Olevia Perches P, DO  ?pantoprazole (PROTONIX) 40 MG tablet Take 1 tablet (40 mg total) by mouth daily. 12/17/21   Dione Booze, MD  ?Probiotic Product ([employer] MOOD SUPPORT PROBIOTIC PO) Take by mouth daily.    [provider]  ?Vitamin D, Ergocalciferol, (DRISDOL) 1.25 MG (50000 UNIT) CAPS capsule Take 1 capsule (50,000 Units total) by mouth every 7 (seven) days. 11/25/21   Dorcas Carrow, DO  ? ? ?Family History  ?Problem Relation Age of Onset  ? Diabetes Mother   ? Cancer Mother 29  ?     brain, skin  ? Cancer Father 45  ?      kidney, bone, rectal  ? Hyperlipidemia Father   ? Hypertension Father   ? Stroke Father   ? Allergies Sister   ? Lupus Sister   ? Breast cancer Maternal Aunt 55  ? Cancer Paternal Aunt   ?     breast  ? Diabetes Brother   ? Congenital heart disease Brother   ?  ? ?Social History  ? ?Tobacco Use  ? Smoking status: Former  ?  Types: Cigarettes  ?  Quit date: 04/03/2017  ?  Years since quitting: 4.7  ? Smokeless tobacco: Never  ? Tobacco comments:  ?  only smoked for about 42months   ?Vaping Use  ? Vaping Use: Never used  ?Substance Use Topics  ? Alcohol use: Yes  ?  Alcohol/week: 3.0 standard drinks  ?  Types: 3 Standard drinks or equivalent per week  ?  Comment: a few times a week  ? Drug use: Not Currently  ?  Types: Marijuana  ? ? ?Allergies as of 12/24/2021 - Review Complete 12/24/2021  ?Allergen Reaction Noted  ? Acetaminophen Nausea Only 07/17/2011  ? Valproic acid Other (See Comments) 08/22/2015  ? Depakote [divalproex sodium] Other (See Comments) 06/15/2015  ? ? ?Review of Systems:    ?All systems reviewed and negative except where noted in HPI. ? ? Physical Exam:  ?BP 110/69   Pulse (!) 57   Temp 98.3 ?F (36.8 ?C) (Oral)   Ht 5\' 2"  (1.575 m)   Wt 135 lb 9.6 oz (61.5 kg)   BMI 24.80 kg/m?  ?No LMP recorded. ?Psych:  Alert and cooperative. Normal mood and affect. ?General:   Alert,  Well-developed, well-nourished, pleasant and cooperative in NAD ?Head:  Normocephalic and atraumatic. ?Eyes:  Sclera clear, no icterus.   Conjunctiva pink. ?Ears:  Normal auditory acuity. ?Lungs:  Respirations even and unlabored.  Clear throughout to auscultation.   No wheezes, crackles, or rhonchi. No acute distress. ?Heart:  Regular rate and rhythm; no murmurs, clicks, rubs, or gallops. ?Abdomen:  Normal bowel sounds.  No bruits.  Soft, non-tender and non-distended without masses, hepatosplenomegaly or hernias noted.  No guarding or rebound tenderness.    ?Neurologic:  Alert and oriented x3;  grossly normal  neurologically. ?Psych:  Alert and cooperative. Normal mood and affect. ? ?Imaging Studies: ?MR Brain W Wo Contrast ? ?Result Date: 12/19/2021 ?CLINICAL DATA:  Nonspecific dizziness. Headaches with ringing in ears and vertigo. EXAM: MRI HEAD WITHOUT AND WITH CONTRAST TECHNIQUE: Multiplanar, multiecho pulse sequences of the brain and surrounding structures were obtained without and with intravenous contrast. CONTRAST:  21mL MULTIHANCE GADOBENATE DIMEGLUMINE 529 MG/ML IV SOLN COMPARISON:  Head CT 08/21/2017. FINDINGS: Brain: No infarction, hemorrhage, hydrocephalus, extra-axial  collection or mass lesion. No abnormal enhancement, white matter disease, or brain atrophy. Vascular: Normal flow voids and preserved vascular enhancements. There is an anterior and medial outpouching from the left ICA at the ophthalmic segment measuring 3-4 mm, see postcontrast and axial noncontrast T1 weighted imaging. An IAC protocol was not performed but still high-resolution imaging of the temporal bones by volumetric postcontrast series. Skull and upper cervical spine: Normal marrow signal Sinuses/Orbits: Nonenhancing retention cyst in the left maxillary sinus. Trace mucosal thickening elsewhere. Trace opacification of mastoid air cells. Negative nasopharynx. Other: Cyst posterior to the left mandibular head which could be salivary or articular, only 5 mm and simple appearing. IMPRESSION: 1. No explanation for dizziness.  Negative for retrocochlear lesion. 2. 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 Recommend MRA for confirmation/follow-up purposes. Electronically Signed   By: Tiburcio PeaJonathan  Watts M.D.   On: 12/19/2021 21:50  ? ?CT ABDOMEN PELVIS W CONTRAST ? ?Result Date: 12/17/2021 ?CLINICAL DATA:  Left upper quadrant pain EXAM: CT ABDOMEN AND PELVIS WITH CONTRAST TECHNIQUE: Multidetector CT imaging of the abdomen and pelvis was performed using the standard protocol following bolus administration of intravenous contrast. RADIATION DOSE REDUCTION:  This exam was performed according to the departmental dose-optimization program which includes automated exposure control, adjustment of the mA and/or kV according to patient size and/or use of iterative reconstruction techn

## 2021-12-24 NOTE — Telephone Encounter (Signed)
Copied from CRM [id number]. Topic: General - Other ?>> Dec 24, 2021 11:50 AM Gaetana Michaelis A wrote: ?Reason for CRM: The patient would like to be contacted by a member of staff  ? ?The patient would like to review their MRI/ imaging results from 12/19/21 ? ?Please contact further ?

## 2021-12-24 NOTE — Telephone Encounter (Signed)
Routing to provider for results of MRI. Do not see that they have been resulted.  ?

## 2021-12-24 NOTE — Telephone Encounter (Signed)
Called and LVM asking for patient to please return my call.  

## 2021-12-25 ENCOUNTER — Telehealth: Payer: Self-pay | Admitting: Family Medicine

## 2021-12-25 NOTE — Telephone Encounter (Signed)
Copied from CRM [id number]. Topic: Referral - Request for Referral ?>> Dec 24, 2021  5:05 PM Randol Kern wrote: ?Has patient seen PCP for this complaint? Yes.   ?*If NO, is insurance requiring patient see PCP for this issue before PCP can refer them? ?Referral for which specialty: Neurology  ?Preferred provider/office: [facility]  ?Reason for referral: Per recent visit ?

## 2021-12-25 NOTE — Telephone Encounter (Signed)
Please redirect referral placed yesterday specifically to [facility]. Unclear if she wants [facility] or [facility] proper ?

## 2021-12-25 NOTE — Telephone Encounter (Signed)
Patient is aware of results and referral placed 

## 2021-12-26 LAB — H. PYLORI BREATH TEST: H pylori Breath Test: NEGATIVE

## 2021-12-27 ENCOUNTER — Other Ambulatory Visit: Payer: Self-pay

## 2021-12-27 ENCOUNTER — Ambulatory Visit: Payer: No Typology Code available for payment source | Admitting: Family Medicine

## 2021-12-27 ENCOUNTER — Encounter: Payer: Self-pay | Admitting: Family Medicine

## 2021-12-27 VITALS — BP 96/66 | HR 75 | Temp 98.3°F | Wt 135.0 lb

## 2021-12-27 DIAGNOSIS — E538 Deficiency of other specified B group vitamins: Secondary | ICD-10-CM | POA: Diagnosis not present

## 2021-12-27 DIAGNOSIS — E559 Vitamin D deficiency, unspecified: Secondary | ICD-10-CM | POA: Insufficient documentation

## 2021-12-27 DIAGNOSIS — R42 Dizziness and giddiness: Secondary | ICD-10-CM

## 2021-12-27 DIAGNOSIS — D509 Iron deficiency anemia, unspecified: Secondary | ICD-10-CM

## 2021-12-27 DIAGNOSIS — R519 Headache, unspecified: Secondary | ICD-10-CM

## 2021-12-27 MED ORDER — NORTRIPTYLINE HCL 10 MG PO CAPS: 10.0000 mg | ORAL_CAPSULE | Freq: Every day | ORAL | 3 refills | Status: DC

## 2021-12-27 MED ORDER — METAXALONE 800 MG PO TABS: 800.0000 mg | ORAL_TABLET | Freq: Three times a day (TID) | ORAL | 3 refills | Status: DC | PRN

## 2021-12-27 NOTE — Assessment & Plan Note (Signed)
Improving. Labs being drawn today. Await results.  ?

## 2021-12-27 NOTE — Assessment & Plan Note (Signed)
Improving. Labs being drawn today. Await results.  ?

## 2021-12-27 NOTE — Progress Notes (Signed)
? ?BP 96/66   Pulse 75   Temp 98.3 ?F (36.8 ?C)   Wt 135 lb (61.2 kg)   SpO2 99%   BMI 24.69 kg/m?   ? ?Subjective:  ? ? Patient ID: Belinda [name], female    DOB: May 29, 1976, 46 y.o.   MRN: [id number] ? ?HPI: ?Belinda [name] is a 46 y.o. female ? ?Chief Complaint  ?Patient presents with  ? Headache  ?  Patient states her GI doctor believes she has an ulcer from taking so much NSAID's for her headaches, it was recommended she talk to PCP about other medication. Patient has colonoscopy and endoscopy scheduled next month. Patient would like FMLA forms to be completed.   ? Dizziness  ?  Patient states dizziness is gone, and ringing in her ear is better. Patient would like to discuss recent MRI   ? ?HEADACHES ?Duration: about a month ?Onset: sudden ?Severity: severe ?Quality: aching and sore ?Frequency: daily about ?Location: R parietal and top of her head ?Headache duration:hours ?Radiation: no ?Time of day headache occurs: at random ?Alleviating factors: BC powder and advil ?Aggravating factors:  ?Headache status at time of visit: current headache ?Treatments attempted: rest, ibuprofen, and aleve", excedrine   ?Aura: no ?Nausea:  no ?Vomiting: no ?Photophobia:  no ?Phonophobia:  no ?Effect on social functioning:  no ?Confusion:  no ?Gait disturbance/ataxia:  no ?Behavioral changes:  no ?Fevers:  no ? ?Relevant past medical, surgical, family and social history reviewed and updated as indicated. Interim medical history since our last visit reviewed. ?Allergies and medications reviewed and updated. ? ?Review of Systems  ?Constitutional: Negative.   ?Respiratory: Negative.    ?Cardiovascular: Negative.   ?Musculoskeletal: Negative.   ?Neurological:  Positive for headaches. Negative for dizziness, tremors, seizures, syncope, facial asymmetry, speech difficulty, weakness, light-headedness and numbness.  ?Psychiatric/Behavioral: Negative.    ? ?Per HPI unless specifically indicated above ? ?   ?Objective:  ?  ?BP  96/66   Pulse 75   Temp 98.3 ?F (36.8 ?C)   Wt 135 lb (61.2 kg)   SpO2 99%   BMI 24.69 kg/m?   ?Wt Readings from Last 3 Encounters:  ?12/27/21 135 lb (61.2 kg)  ?12/24/21 135 lb 9.6 oz (61.5 kg)  ?12/16/21 134 lb (60.8 kg)  ?  ?Physical Exam ?Vitals and nursing note reviewed.  ?Constitutional:   ?   General: She is not in acute distress. ?   Appearance: Normal appearance. She is normal weight. She is not ill-appearing, toxic-appearing or diaphoretic.  ?HENT:  ?   Head: Normocephalic and atraumatic.  ?   Right Ear: External ear normal.  ?   Left Ear: External ear normal.  ?   Nose: Nose normal.  ?   Mouth/Throat:  ?   Mouth: Mucous membranes are moist.  ?   Pharynx: Oropharynx is clear.  ?Eyes:  ?   General: No scleral icterus.    ?   Right eye: No discharge.     ?   Left eye: No discharge.  ?   Extraocular Movements: Extraocular movements intact.  ?   Conjunctiva/sclera: Conjunctivae normal.  ?   Pupils: Pupils are equal, round, and reactive to light.  ?Cardiovascular:  ?   Rate and Rhythm: Normal rate and regular rhythm.  ?   Pulses: Normal pulses.  ?   Heart sounds: Normal heart sounds. No murmur heard. ?  No friction rub. No gallop.  ?Pulmonary:  ?   Effort: Pulmonary effort is  normal. No respiratory distress.  ?   Breath sounds: Normal breath sounds. No stridor. No wheezing, rhonchi or rales.  ?Chest:  ?   Chest wall: No tenderness.  ?Musculoskeletal:     ?   General: Normal range of motion.  ?   Cervical back: Normal range of motion and neck supple.  ?Skin: ?   General: Skin is warm and dry.  ?   Capillary Refill: Capillary refill takes less than 2 seconds.  ?   Coloration: Skin is not jaundiced or pale.  ?   Findings: No bruising, erythema, lesion or rash.  ?Neurological:  ?   General: No focal deficit present.  ?   Mental Status: She is alert and oriented to person, place, and time. Mental status is at baseline.  ?Psychiatric:     ?   Mood and Affect: Mood normal.     ?   Behavior: Behavior normal.     ?    Thought Content: Thought content normal.     ?   Judgment: Judgment normal.  ? ? ?Results for orders placed or performed in visit on 12/24/21  ?H. pylori breath test  ?Result Value Ref Range  ? H pylori Breath Test Negative Negative  ? ?   ?Assessment & Plan:  ? ?Problem List Items Addressed This Visit   ? ?  ? Other  ? B12 deficiency  ?  Improving. Labs being drawn today. Await results.  ?  ?  ? Relevant Orders  ? B12  ? IDA (iron deficiency anemia)  ?  Improving. Labs being drawn today. Await results.  ?  ?  ? Relevant Orders  ? CBC with Differential/Platelet  ? Iron Binding Cap (TIBC)([facility])  ? Ferritin  ? Dizziness  ?  Resolved.  ?  ?  ? Vitamin D deficiency  ?  Improving. Labs being drawn today. Await results.  ?  ?  ? Relevant Orders  ? VITAMIN D 25 Hydroxy (Vit-D Deficiency, Fractures)  ? ?Other Visit Diagnoses   ? ? Acute nonintractable headache, unspecified headache type    -  Primary  ? MRI normal. To see neurology. ?Ulcer, has to avoid NSAIDs, Allergic to tylenol. Will treat with muscle relaxer and nortriptyline. Recheck 1 month.   ? Relevant Medications  ? nortriptyline (PAMELOR) 10 MG capsule  ? metaxalone (SKELAXIN) 800 MG tablet  ? ?  ?  ? ?Follow up plan: ?Return in about 4 weeks (around 01/24/2022). ? ? ? ? ? ?

## 2021-12-27 NOTE — Assessment & Plan Note (Signed)
Resolved

## 2021-12-28 LAB — CBC WITH DIFFERENTIAL/PLATELET
Basophils Absolute: 0 10*3/uL (ref 0.0–0.2)
Basos: 1 %
EOS (ABSOLUTE): 0.1 10*3/uL (ref 0.0–0.4)
Eos: 1 %
Hematocrit: 39 % (ref 34.0–46.6)
Hemoglobin: 13.1 g/dL (ref 11.1–15.9)
Immature Grans (Abs): 0 10*3/uL (ref 0.0–0.1)
Immature Granulocytes: 0 %
Lymphocytes Absolute: 2.5 10*3/uL (ref 0.7–3.1)
Lymphs: 42 %
MCH: 32.3 pg (ref 26.6–33.0)
MCHC: 33.6 g/dL (ref 31.5–35.7)
MCV: 96 fL (ref 79–97)
Monocytes Absolute: 0.3 10*3/uL (ref 0.1–0.9)
Monocytes: 6 %
Neutrophils Absolute: 3.1 10*3/uL (ref 1.4–7.0)
Neutrophils: 50 %
Platelets: 220 10*3/uL (ref 150–450)
RBC: 4.05 x10E6/uL (ref 3.77–5.28)
RDW: 12.8 % (ref 11.7–15.4)
WBC: 6 10*3/uL (ref 3.4–10.8)

## 2021-12-28 LAB — IRON AND TIBC
Iron Saturation: 43 % (ref 15–55)
Iron: 133 ug/dL (ref 27–159)
Total Iron Binding Capacity: 311 ug/dL (ref 250–450)
UIBC: 178 ug/dL (ref 131–425)

## 2021-12-28 LAB — VITAMIN B12: Vitamin B-12: 780 pg/mL (ref 232–1245)

## 2021-12-28 LAB — VITAMIN D 25 HYDROXY (VIT D DEFICIENCY, FRACTURES): Vit D, 25-Hydroxy: 53.8 ng/mL (ref 30.0–100.0)

## 2021-12-28 LAB — FERRITIN: Ferritin: 232 ng/mL — ABNORMAL HIGH (ref 15–150)

## 2022-01-03 ENCOUNTER — Ambulatory Visit: Payer: No Typology Code available for payment source | Admitting: Family Medicine

## 2022-01-06 ENCOUNTER — Emergency Department
Admission: EM | Admit: 2022-01-06 | Discharge: 2022-01-06 | Disposition: A | Discharge status: Home / Self Care | Payer: No Typology Code available for payment source | Attending: Emergency Medicine | Admitting: Emergency Medicine

## 2022-01-06 ENCOUNTER — Emergency Department: Payer: No Typology Code available for payment source

## 2022-01-06 DIAGNOSIS — S62639B Displaced fracture of distal phalanx of unspecified finger, initial encounter for open fracture: Secondary | ICD-10-CM

## 2022-01-06 DIAGNOSIS — S62636B Displaced fracture of distal phalanx of right little finger, initial encounter for open fracture: Secondary | ICD-10-CM | POA: Insufficient documentation

## 2022-01-06 DIAGNOSIS — Z23 Encounter for immunization: Secondary | ICD-10-CM | POA: Insufficient documentation

## 2022-01-06 DIAGNOSIS — S61306A Unspecified open wound of right little finger with damage to nail, initial encounter: Secondary | ICD-10-CM | POA: Diagnosis not present

## 2022-01-06 DIAGNOSIS — W232XXA Caught, crushed, jammed or pinched between a moving and stationary object, initial encounter: Secondary | ICD-10-CM | POA: Diagnosis not present

## 2022-01-06 DIAGNOSIS — S61309A Unspecified open wound of unspecified finger with damage to nail, initial encounter: Secondary | ICD-10-CM

## 2022-01-06 DIAGNOSIS — S6991XA Unspecified injury of right wrist, hand and finger(s), initial encounter: Secondary | ICD-10-CM | POA: Diagnosis present

## 2022-01-06 IMAGING — DX DG FINGER LITTLE 2+V*R*
3 series · 3 of 3 positions shown · non-contrast
Comparison: None.

CLINICAL DATA: Trauma

EXAM:
RIGHT LITTLE FINGER 2+V

[finger ap]
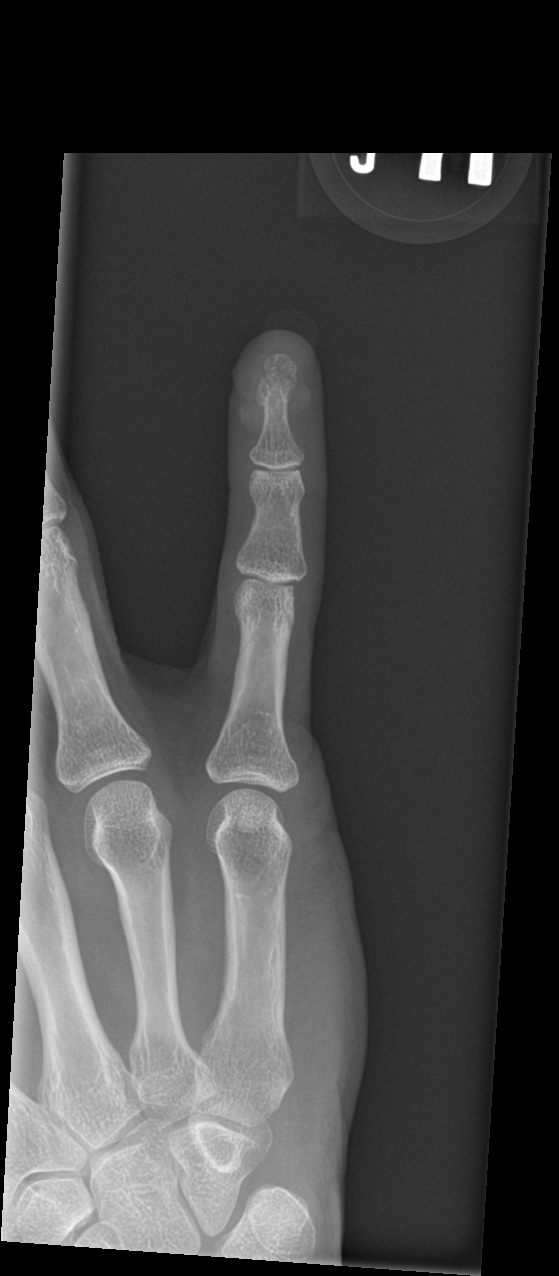

[finger obl]
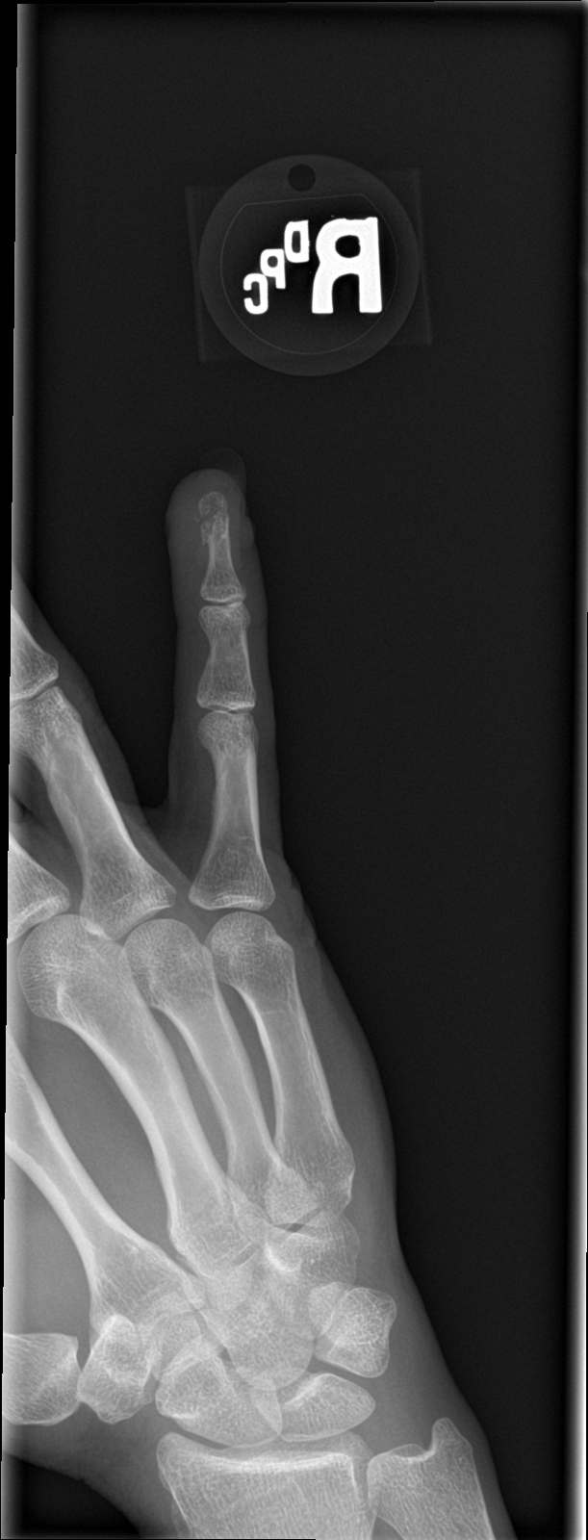

[finger lat]
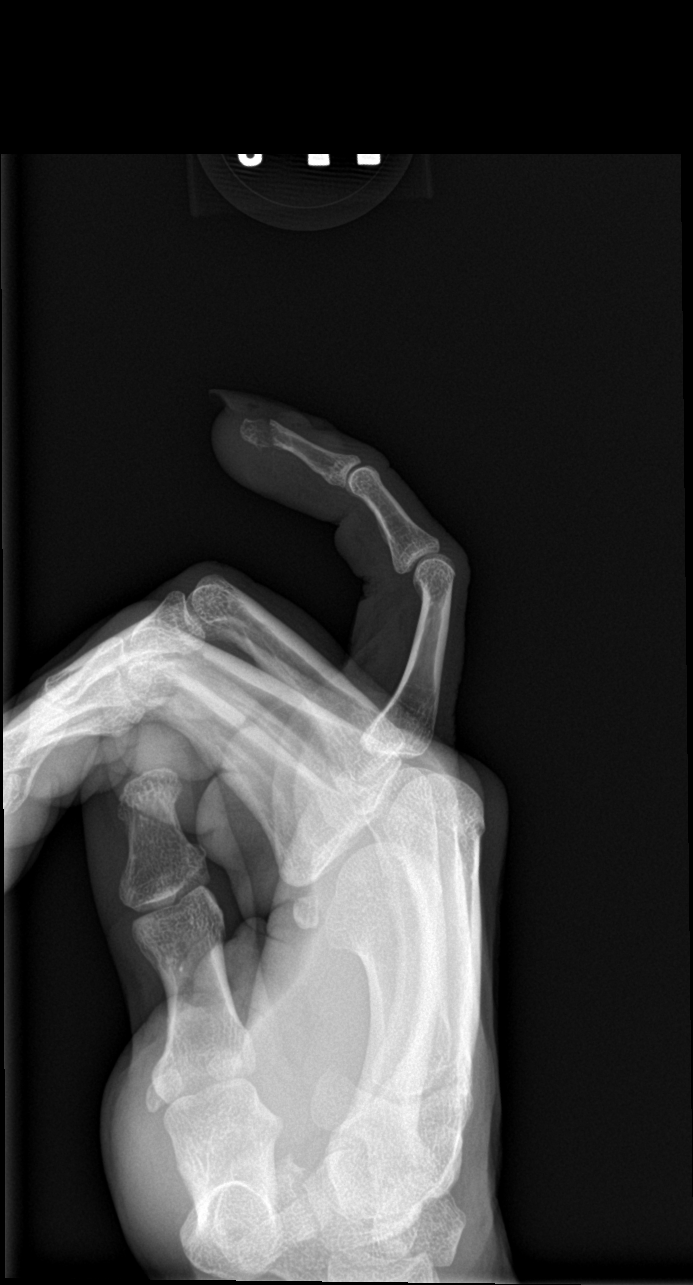

[3 of 3 positions shown; findings below may reference images not displayed]

FINDINGS: There is comminuted fracture at the tip of distal phalanx of right
fifth finger. There is minimal distraction of fracture fragments.
There is soft tissue deformity adjacent to the fracture.
IMPRESSION: Comminuted, slightly displaced fracture is seen in the tip of distal
phalanx of right fifth finger.

## 2022-01-06 MED ORDER — OXYCODONE-ACETAMINOPHEN 5-325 MG PO TABS: 1.0000 | ORAL_TABLET | Freq: Four times a day (QID) | ORAL | 0 refills | Status: DC | PRN

## 2022-01-06 MED ORDER — LIDOCAINE HCL (PF) 1 % IJ SOLN
10.0000 mL | Freq: Once | INTRAMUSCULAR | Status: AC
  Administered 2022-01-06: 10 mL
  Filled 2022-01-06: qty 10

## 2022-01-06 MED ORDER — OXYCODONE-ACETAMINOPHEN 5-325 MG PO TABS
1.0000 | ORAL_TABLET | ORAL | Status: DC | PRN
  Administered 2022-01-06: 1 via ORAL
  Filled 2022-01-06: qty 1

## 2022-01-06 MED ORDER — ONDANSETRON 4 MG PO TBDP
4.0000 mg | ORAL_TABLET | Freq: Once | ORAL | Status: AC
  Administered 2022-01-06: 4 mg via ORAL
  Filled 2022-01-06: qty 1

## 2022-01-06 MED ORDER — CEPHALEXIN 500 MG PO CAPS: 1000.0000 mg | ORAL_CAPSULE | Freq: Two times a day (BID) | ORAL | 0 refills | Status: AC

## 2022-01-06 MED ORDER — TETANUS-DIPHTH-ACELL PERTUSSIS 5-2.5-18.5 LF-MCG/0.5 IM SUSY
0.5000 mL | PREFILLED_SYRINGE | Freq: Once | INTRAMUSCULAR | Status: AC
  Administered 2022-01-06: 0.5 mL via INTRAMUSCULAR
  Filled 2022-01-06: qty 0.5

## 2022-01-06 MED ORDER — OXYCODONE-ACETAMINOPHEN 5-325 MG PO TABS
1.0000 | ORAL_TABLET | Freq: Once | ORAL | Status: AC
  Administered 2022-01-06: 1 via ORAL
  Filled 2022-01-06: qty 1

## 2022-01-06 MED ORDER — DOXYCYCLINE HYCLATE 100 MG PO TABS: 100.0000 mg | ORAL_TABLET | Freq: Two times a day (BID) | ORAL | 0 refills | Status: DC

## 2022-01-06 NOTE — ED Provider Notes (Addendum)
? ?[facility] ?Provider Note ? ?Patient Contact: 7:54 PM (approximate) ? ? ?History  ? ?Laceration ? ? ?HPI ? ?Belinda [name] is a 46 y.o. female who presents the emergency department complaining of an injury to the l right hand.  Patient accidentally closed her pinky finger into a door with injury to the fingernail and a laceration to the pad of the finger.  Patient states that the fingernail appears to be dangling.  She is having pain to the distal part of her finger.  No other open wounds.  No other complaints at this time.  Tetanus shot was 7 years ago. ?  ? ? ?Physical Exam  ? ?Triage Vital Signs: ?ED Triage Vitals  ?Enc Vitals Group  ?   BP 01/06/22 1934 121/83  ?   Pulse Rate 01/06/22 1934 90  ?   Resp 01/06/22 1934 18  ?   Temp 01/06/22 1934 98.7 ?F (37.1 ?C)  ?   Temp Source 01/06/22 1934 Oral  ?   SpO2 01/06/22 1934 99 %  ?   Weight 01/06/22 1934 132 lb (59.9 kg)  ?   Height 01/06/22 1934 5\' 2"  (1.575 m)  ?   Head Circumference --   ?   Peak Flow --   ?   Pain Score 01/06/22 1940 10  ?   Pain Loc --   ?   Pain Edu? --   ?   Excl. in [place]? --   ? ? ?Most recent vital signs: ?Vitals:  ? 01/06/22 1934  ?BP: 121/83  ?Pulse: 90  ?Resp: 18  ?Temp: 98.7 ?F (37.1 ?C)  ?SpO2: 99%  ? ? ? ?General: Alert and in no acute distress.  ?Cardiovascular:  Good peripheral perfusion ?Respiratory: Normal respiratory effort without tachypnea or retractions. Lungs CTAB.  ?Musculoskeletal: Full range of motion to all extremities.  Visualization of the pinky finger right hand reveals a traumatic injury to the fingernail with laceration extending along the lateral portion of the finger into the pad.  No active bleeding.  The entire proximal nailbed has been disrupted and appears that it is only partially attached along the medial aspect of the nailbed.  No other acute traumatic findings to the right hand.  Patient is still able to extend and flex the digit at this time. ?Neurologic:  No gross focal neurologic  deficits are appreciated.  ?Skin:   No rash noted ?Other: ? ? ?ED Results / Procedures / Treatments  ? ?Labs ?(all labs ordered are listed, but only abnormal results are displayed) ?Labs Reviewed - No data to display ? ? ?EKG ? ? ? ? ?RADIOLOGY ? ?I personally viewed and evaluated these images as part of my medical decision making, as well as reviewing the written report by the radiologist. ? ?ED Provider Interpretation: Patient has a comminuted distal tuft fracture with overlying skin and fingernail trauma making this an open fracture ? ?DG Finger Little Right ? ?Result Date: 01/06/2022 ?CLINICAL DATA:  Trauma EXAM: RIGHT LITTLE FINGER 2+V COMPARISON:  None. FINDINGS: There is comminuted fracture at the tip of distal phalanx of right fifth finger. There is minimal distraction of fracture fragments. There is soft tissue deformity adjacent to the fracture. IMPRESSION: Comminuted, slightly displaced fracture is seen in the tip of distal phalanx of right fifth finger. Electronically Signed   By: Elmer Picker M.D.   On: 01/06/2022 20:04   ? ?PROCEDURES: ? ?Critical Care performed: No ? ?Marland Kitchen.Laceration Repair ? ?Date/Time: 01/06/2022 9:43  PM ?Performed by: Darletta Moll, PA-C ?Authorized by: Darletta Moll, PA-C  ? ?Consent:  ?  Consent obtained:  Verbal ?  Consent given by:  Patient ?  Risks discussed:  Infection, pain, poor wound healing, poor cosmetic result and need for additional repair ?Universal protocol:  ?  Procedure explained and questions answered to patient or proxy's satisfaction: yes   ?  Immediately prior to procedure, a time out was called: yes   ?  Patient identity confirmed:  Verbally with patient ?Anesthesia:  ?  Anesthesia method:  Nerve block ?  Block location:  Pinky finger ?  Block needle gauge:  27 G ?  Block anesthetic:  Lidocaine 1% w/o epi ?  Block technique:  Digital block ?  Block injection procedure:  Anatomic landmarks identified, introduced needle, negative aspiration for  blood and incremental injection ?  Block outcome:  Anesthesia achieved ?Laceration details:  ?  Location:  Finger ?  Finger location:  R small finger ?  Length (cm):  1.5 ?Exploration:  ?  Hemostasis achieved with:  Direct pressure ?  Imaging obtained: x-ray   ?  Imaging outcome: foreign body not noted   ?  Wound exploration: entire depth of wound visualized   ?  Wound extent: underlying fracture   ?  Wound extent: no foreign bodies/material noted   ?  Contaminated: no   ?Treatment:  ?  Area cleansed with:  Povidone-iodine and saline ?  Amount of cleaning:  Extensive ?  Irrigation solution:  Sterile saline ?  Irrigation volume:  500 ml ?Skin repair:  ?  Repair method:  Sutures ?  Suture size:  4-0 ?  Suture technique:  Simple interrupted ?  Number of sutures:  3 ?Approximation:  ?  Approximation:  Close ?Repair type:  ?  Repair type:  Intermediate ?Post-procedure details:  ?  Dressing:  Splint for protection and non-adherent dressing ?  Procedure completion:  Tolerated well, no immediate complications ?Comments:  ?   Patient had digital block performed, nail was completely removed at this time as it was damaged, as well as barely in place along the medial aspect.  Laceration was then repaired. ? ? ?MEDICATIONS ORDERED IN ED: ?Medications  ?oxyCODONE-acetaminophen (PERCOCET/ROXICET) 5-325 MG per tablet 1 tablet (1 tablet Oral Given 01/06/22 1943)  ?oxyCODONE-acetaminophen (PERCOCET/ROXICET) 5-325 MG per tablet 1 tablet (has no administration in time range)  ?Tdap (BOOSTRIX) injection 0.5 mL (has no administration in time range)  ?ondansetron (ZOFRAN-ODT) disintegrating tablet 4 mg (4 mg Oral Given 01/06/22 1943)  ?lidocaine (PF) (XYLOCAINE) 1 % injection 10 mL (10 mLs Infiltration Given 01/06/22 2043)  ? ? ? ?IMPRESSION / MDM / ASSESSMENT AND PLAN / ED COURSE  ?I reviewed the triage vital signs and the nursing notes. ?             ?               ? ?Differential diagnosis includes, but is not limited to, laceration, finger  fracture, tendon injury ? ? ?Patient's diagnosis is consistent with disruption of the fingernail, finger laceration with underlying fracture.  Patient presented to the emergency department after closing a door accidentally on the distal aspect of her pinky finger.  Patient has an underlying fracture on x-ray.  Patient's finger was anesthetized, fingernail was completely removed at this time as it was damaged, as well as only attached partially along the medial aspect of the nailbed.  No evidence of ligamentous injury.Marland Kitchen  Patient's tetanus shot updated at this time.  Patiently placed on both Keflex and doxycycline for coverage of this open fracture.  Follow-up with orthopedics for further management.  Return precautions discussed with the patient.  Patient is given ED precautions to return to the ED for any worsening or new symptoms. ? ? ? ?  ? ? ?FINAL CLINICAL IMPRESSION(S) / ED DIAGNOSES  ? ?Final diagnoses:  ?Open fracture of tuft of distal phalanx of finger  ?Avulsion of fingernail, initial encounter  ? ? ? ?Rx / DC Orders  ? ?ED Discharge Orders   ? ? None  ? ?  ? ? ? ?Note:  This document was prepared using Dragon voice recognition software and may include unintentional dictation errors. ?  ?Darletta Moll, PA-C ?01/06/22 2141 ? ?  ?Darletta Moll, PA-C ?01/06/22 2148 ? ?  ?Naaman Plummer, MD ?01/07/22 1855 ? ?

## 2022-01-08 ENCOUNTER — Encounter: Payer: Self-pay | Admitting: Family Medicine

## 2022-01-13 ENCOUNTER — Telehealth: Payer: Self-pay | Admitting: Gastroenterology

## 2022-01-13 MED ORDER — GOLYTELY 236 G PO SOLR: 4000.0000 mL | Freq: Once | ORAL | 0 refills | Status: AC

## 2022-01-13 NOTE — Addendum Note (Signed)
Addended by: Radene Knee L on: 01/13/2022 04:41 PM ? ? Modules accepted: Orders ? ?

## 2022-01-13 NOTE — Telephone Encounter (Signed)
Sent Golytely to the pharmacy. Called patient and went over instructions for patient  ?

## 2022-01-13 NOTE — Anesthesia Preprocedure Evaluation (Addendum)
Anesthesia Evaluation  ?Patient identified by MRN, date of birth, ID band ?Patient awake ? ? ? ?Reviewed: ?Allergy & Precautions, NPO status , Patient's Chart, lab work & pertinent test results ? ?History of Anesthesia Complications ?Negative for: history of anesthetic complications ? ?Airway ?Mallampati: II ? ?TM Distance: >3 FB ?Neck ROM: Full ? ? ? Dental ?no notable dental hx. ? ?  ?Pulmonary ?neg pulmonary ROS, neg sleep apnea, neg COPD, former smoker,  ?  ?breath sounds clear to auscultation- rhonchi ?(-) wheezing ? ? ? ? ? Cardiovascular ?Exercise Tolerance: Good ?(-) hypertension(-) CAD and (-) Past MI  ?Rhythm:Regular Rate:Normal ?- Systolic murmurs and - Diastolic murmurs ? ?  ?Neuro/Psych ? Headaches, PSYCHIATRIC DISORDERS Bipolar Disorder   ? GI/Hepatic ?Neg liver ROS, PUD (past episode), GERD  Controlled,Hx of bariatric surgery ? ?  ?Endo/Other  ?negative endocrine ROS ? Renal/GU ?negative Renal ROS  ? ?  ?Musculoskeletal ? ?(+) Arthritis ,  ? Abdominal ?(+) + obese,   ?Peds ? Hematology ? ?(+) Blood dyscrasia, anemia ,   ?Anesthesia Other Findings ?Past Medical History: ?No date: Arthritis ?    Comment: lower back ?No date: Bipolar affective disorder ([place]) ?No date: Deviated septum ?No date: GERD (gastroesophageal reflux disease) ?No date: Headache(784.0) ?    Comment: migraines - none 2 months ?No date: Peptic ulcer ?No date: Pre-diabetes ?No date: Schizophrenia [facility]) ?No date: Sleep apnea ?No date: Substance abuse ? ? Reproductive/Obstetrics ? ?  ? ? ? ? ? ? ? ? ? ? ? ? ? ?  ?  ? ? ? ? ? ? ? ?Anesthesia Physical ? ?Anesthesia Plan ? ?ASA: 2 ? ?Anesthesia Plan: General  ? ?Post-op Pain Management:   ? ?Induction: Intravenous ? ?PONV Risk Score and Plan:  ? ?Airway Management Planned: Natural Airway ? ?Additional Equipment:  ? ?Intra-op Plan:  ? ?Post-operative Plan:  ? ?Informed Consent: I have reviewed the patients History and Physical, chart, labs and discussed the  procedure including the risks, benefits and alternatives for the proposed anesthesia with the patient or authorized representative who has indicated his/her understanding and acceptance.  ? ? ? ?Dental advisory given ? ?Plan Discussed with: CRNA and Anesthesiologist ? ?Anesthesia Plan Comments:   ? ? ? ? ? ?Anesthesia Quick Evaluation ? ?

## 2022-01-13 NOTE — Telephone Encounter (Signed)
Patients colonoscopy is scheduled for tomorrow morning on 01/13/2022. Patient does noit have bowel prep. Pharmacy did not have a prescription to fill for her. ?

## 2022-01-14 ENCOUNTER — Ambulatory Visit
Admission: RE | Admit: 2022-01-14 | Discharge: 2022-01-14 | Disposition: A | Discharge status: Home / Self Care | Payer: No Typology Code available for payment source | Source: Ambulatory Visit | Attending: Gastroenterology | Admitting: Gastroenterology

## 2022-01-14 ENCOUNTER — Ambulatory Visit: Payer: No Typology Code available for payment source | Admitting: Anesthesiology

## 2022-01-14 ENCOUNTER — Encounter
Admission: RE | Disposition: A | Discharge status: Home / Self Care | Payer: Self-pay | Source: Ambulatory Visit | Attending: Gastroenterology

## 2022-01-14 ENCOUNTER — Encounter: Payer: Self-pay | Admitting: Gastroenterology

## 2022-01-14 DIAGNOSIS — G4730 Sleep apnea, unspecified: Secondary | ICD-10-CM | POA: Insufficient documentation

## 2022-01-14 DIAGNOSIS — D509 Iron deficiency anemia, unspecified: Secondary | ICD-10-CM | POA: Diagnosis not present

## 2022-01-14 DIAGNOSIS — Z79899 Other long term (current) drug therapy: Secondary | ICD-10-CM | POA: Diagnosis not present

## 2022-01-14 DIAGNOSIS — Z800 Family history of malignant neoplasm of digestive organs: Secondary | ICD-10-CM | POA: Diagnosis not present

## 2022-01-14 DIAGNOSIS — K219 Gastro-esophageal reflux disease without esophagitis: Secondary | ICD-10-CM | POA: Diagnosis not present

## 2022-01-14 DIAGNOSIS — Z87891 Personal history of nicotine dependence: Secondary | ICD-10-CM | POA: Diagnosis not present

## 2022-01-14 DIAGNOSIS — Z1211 Encounter for screening for malignant neoplasm of colon: Secondary | ICD-10-CM | POA: Diagnosis present

## 2022-01-14 DIAGNOSIS — Z791 Long term (current) use of non-steroidal anti-inflammatories (NSAID): Secondary | ICD-10-CM

## 2022-01-14 DIAGNOSIS — F319 Bipolar disorder, unspecified: Secondary | ICD-10-CM | POA: Diagnosis not present

## 2022-01-14 DIAGNOSIS — D508 Other iron deficiency anemias: Secondary | ICD-10-CM

## 2022-01-14 DIAGNOSIS — Z980 Intestinal bypass and anastomosis status: Secondary | ICD-10-CM | POA: Diagnosis not present

## 2022-01-14 DIAGNOSIS — E119 Type 2 diabetes mellitus without complications: Secondary | ICD-10-CM | POA: Diagnosis not present

## 2022-01-14 DIAGNOSIS — R634 Abnormal weight loss: Secondary | ICD-10-CM

## 2022-01-14 HISTORY — PX: COLONOSCOPY WITH PROPOFOL: SHX5780

## 2022-01-14 HISTORY — PX: ESOPHAGOGASTRODUODENOSCOPY: SHX5428

## 2022-01-14 LAB — POCT PREGNANCY, URINE: Preg Test, Ur: NEGATIVE

## 2022-01-14 SURGERY — COLONOSCOPY WITH PROPOFOL
Anesthesia: General

## 2022-01-14 MED ORDER — PROPOFOL 500 MG/50ML IV EMUL
INTRAVENOUS | Status: AC
  Filled 2022-01-14: qty 50

## 2022-01-14 MED ORDER — SODIUM CHLORIDE 0.9 % IV SOLN: INTRAVENOUS | Status: DC

## 2022-01-14 MED ORDER — PROPOFOL 10 MG/ML IV BOLUS
INTRAVENOUS | Status: DC | PRN
  Administered 2022-01-14: 50 mg via INTRAVENOUS
  Administered 2022-01-14: 100 mg via INTRAVENOUS
  Administered 2022-01-14: 30 mg via INTRAVENOUS

## 2022-01-14 MED ORDER — SODIUM CHLORIDE 0.9 % IV SOLN: INTRAVENOUS | Status: DC | PRN

## 2022-01-14 MED ORDER — PHENYLEPHRINE HCL (PRESSORS) 10 MG/ML IV SOLN
INTRAVENOUS | Status: AC
Start: 2022-01-14 — End: ?
  Filled 2022-01-14: qty 1

## 2022-01-14 MED ORDER — PROPOFOL 500 MG/50ML IV EMUL
INTRAVENOUS | Status: DC | PRN
  Administered 2022-01-14: 200 ug/kg/min via INTRAVENOUS

## 2022-01-14 NOTE — Anesthesia Procedure Notes (Signed)
Date/Time: 01/14/2022 8:03 AM ?Performed by: Junious Silk, CRNA ?Pre-anesthesia Checklist: Patient identified, Emergency Drugs available, Suction available, Patient being monitored and Timeout performed ?Oxygen Delivery Method: Nasal cannula ? ? ? ? ?

## 2022-01-14 NOTE — H&P (Signed)
? ? ? ?Wyline Mood, MD ?[address], [place], [place], [address] ?[address], [address], [place], [place], [address] ?Phone: [phone]  ?Fax: [phone] ? ?Primary Care Physician:  Dorcas Carrow, DO ? ? ?Pre-Procedure History & Physical: ?HPI:  Belinda [name] is a 46 y.o. female is here for an endoscopy and colonoscopy  ?  ?Past Medical History:  ?Diagnosis Date  ? Anemia   ? Anxiety   ? Arthritis   ? lower back  ? Bipolar affective disorder (HCC)   ? Controlled substance agreement signed 10/16/2017  ? Deviated septum   ? Diabetes mellitus without complication (HCC)   ? GERD (gastroesophageal reflux disease)   ? Headache(784.0)   ? migraines - none 2 months  ? Low iron   ? Peptic ulcer   ? Pre-diabetes   ? Schizophrenia (HCC)   ? Sleep apnea   ? Substance abuse (HCC)   ? ? ?Past Surgical History:  ?Procedure Laterality Date  ? ESOPHAGOGASTRODUODENOSCOPY  08/05/2012  ? Procedure: ESOPHAGOGASTRODUODENOSCOPY (EGD);  Surgeon: Kandis Cocking, MD;  Location: [facility];  Service: General;  Laterality: N/A;  ? ESOPHAGOGASTRODUODENOSCOPY (EGD) WITH PROPOFOL N/A 08/06/2016  ? Procedure: ESOPHAGOGASTRODUODENOSCOPY (EGD) WITH PROPOFOL;  Surgeon: Wyline Mood, MD;  Location: [facility];  Service: Endoscopy;  Laterality: N/A;  ? ESOPHAGOGASTRODUODENOSCOPY (EGD) WITH PROPOFOL N/A 11/07/2016  ? Procedure: ESOPHAGOGASTRODUODENOSCOPY (EGD) WITH PROPOFOL;  Surgeon: Wyline Mood, MD;  Location: [facility];  Service: Endoscopy;  Laterality: N/A;  ? 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 Procedure: LAPAROSCOPIC ROUX-EN-Y GASTRIC;  Surgeon: Mariella Saa, MD;  Location: [facility];  Service: General;  Laterality: N/A;  upper endoscopy  ? SEPTOPLASTY  2003  ? ? ?Prior to Admission medications   ?Medication Sig Start Date End Date Taking? Authorizing Provider  ?B-D 3CC LUER-LOK SYR 25GX1" 25G X 1" 3 ML MISC  02/27/16  Yes [provider]  ?cyanocobalamin (,VITAMIN B-12,) 1000 MCG/ML injection Inject 1 mL  (1,000 mcg total) into the muscle once a week. 05/02/21  Yes Johnson, Megan P, DO  ?doxycycline (VIBRA-TABS) 100 MG tablet Take 1 tablet (100 mg total) by mouth 2 (two) times daily. 01/06/22  Yes Cuthriell, Delorise Royals, PA-C  ?lidocaine (LIDODERM) 5 % Place 1 patch onto the skin daily. Remove & Discard patch within 12 hours or as directed by MD 10/14/17  Yes Particia Nearing, PA-C  ?nortriptyline (PAMELOR) 10 MG capsule Take 1 capsule (10 mg total) by mouth at bedtime. 12/27/21  Yes Johnson, Megan P, DO  ?oxyCODONE-acetaminophen (PERCOCET/ROXICET) 5-325 MG tablet Take 1 tablet by mouth every 6 (six) hours as needed for severe pain. 01/06/22  Yes Cuthriell, Delorise Royals, PA-C  ?Probiotic Product ([employer] MOOD SUPPORT PROBIOTIC PO) Take by mouth daily.   Yes [provider]  ?Vitamin D, Ergocalciferol, (DRISDOL) 1.25 MG (50000 UNIT) CAPS capsule Take 1 capsule (50,000 Units total) by mouth every 7 (seven) days. 11/25/21  Yes Johnson, Megan P, DO  ?metaxalone (SKELAXIN) 800 MG tablet Take 1 tablet (800 mg total) by mouth 3 (three) times daily as needed for muscle spasms. 12/27/21   Johnson, Megan P, DO  ?ondansetron (ZOFRAN) 4 MG tablet Take 1 tablet (4 mg total) by mouth every 8 (eight) hours as needed. for nausea 05/02/21   Olevia Perches P, DO  ?pantoprazole (PROTONIX) 40 MG tablet Take 1 tablet (40 mg total) by mouth 2 (two) times daily. 12/24/21   Wyline Mood, MD  ? ? ?Allergies as of  12/24/2021 - Review Complete 12/24/2021  ?Allergen Reaction Noted  ? Acetaminophen Nausea Only 07/17/2011  ? Valproic acid Other (See Comments) 08/22/2015  ? Depakote [divalproex sodium] Other (See Comments) 06/15/2015  ? ? ?Family History  ?Problem Relation Age of Onset  ? Diabetes Mother   ? Cancer Mother 61  ?     brain, skin  ? Cancer Father 47  ?     kidney, bone, rectal  ? Hyperlipidemia Father   ? Hypertension Father   ? Stroke Father   ? Allergies Sister   ? Lupus Sister   ? Breast cancer Maternal Aunt 19  ? Cancer Paternal Aunt    ?     breast  ? Diabetes Brother   ? Congenital heart disease Brother   ? ? ?Social History  ? ?Socioeconomic History  ? Marital status: Legally Separated  ?  Spouse name: Not on file  ? Number of children: Not on file  ? Years of education: Not on file  ? Highest education level: Not on file  ?Occupational History  ? Occupation: unemployed  ?Tobacco Use  ? Smoking status: Former  ?  Types: Cigarettes  ?  Quit date: 04/03/2017  ?  Years since quitting: 4.7  ? Smokeless tobacco: Never  ? Tobacco comments:  ?  only smoked for about 41months   ?Vaping Use  ? Vaping Use: Never used  ?Substance and Sexual Activity  ? Alcohol use: Yes  ?  Alcohol/week: 3.0 standard drinks  ?  Types: 3 Standard drinks or equivalent per week  ?  Comment: a few times a week  ? Drug use: Not Currently  ?  Types: Marijuana  ? Sexual activity: Yes  ?  Birth control/protection: None  ?Other Topics Concern  ? Not on file  ?Social History Narrative  ? Not on file  ? ?Social Determinants of Health  ? ?Financial Resource Strain: Not on file  ?Food Insecurity: Not on file  ?Transportation Needs: Not on file  ?Physical Activity: Not on file  ?Stress: Not on file  ?Social Connections: Not on file  ?Intimate Partner Violence: Not on file  ? ? ?Review of Systems: ?See HPI, otherwise negative ROS ? ?Physical Exam: ?BP 105/75   Pulse 71   Temp 98.1 ?F (36.7 ?C) (Temporal)   Resp 16   Ht 5\' 2"  (1.575 m)   Wt 60 kg   LMP 12/23/2021 (Approximate)   BMI 24.19 kg/m?  ?General:   Alert,  pleasant and cooperative in NAD ?Head:  Normocephalic and atraumatic. ?Neck:  Supple; no masses or thyromegaly. ?Lungs:  Clear throughout to auscultation, normal respiratory effort.    ?Heart:  +S1, +S2, Regular rate and rhythm, No edema. ?Abdomen:  Soft, nontender and nondistended. Normal bowel sounds, without guarding, and without rebound.   ?Neurologic:  Alert and  oriented x4;  grossly normal neurologically. ? ?Impression/Plan: ?Belinda [name] is here for an  endoscopy and colonoscopy  to be performed for  evaluation of anemia and colon cancer screening  ?   ?Risks, benefits, limitations, and alternatives regarding endoscopy have been reviewed with the patient.  Questions have been answered.  All parties agreeable. ? ? ?[name], MD  01/14/2022, 7:49 AM ? ?

## 2022-01-14 NOTE — Transfer of Care (Signed)
Immediate Anesthesia Transfer of Care Note ? ?Patient: Belinda [name] ? ?Procedure(s) Performed: COLONOSCOPY WITH PROPOFOL ?ESOPHAGOGASTRODUODENOSCOPY (EGD) ? ?Patient Location: PACU ? ?Anesthesia Type:General ? ?Level of Consciousness: awake, alert  and oriented ? ?Airway & Oxygen Therapy: Patient Spontanous Breathing and Patient connected to nasal cannula oxygen ? ?Post-op Assessment: Report given to RN and Post -op Vital signs reviewed and stable ? ?Post vital signs: Reviewed and stable ? ?Last Vitals:  ?Vitals Value Taken Time  ?BP 101/69 01/14/22 0825  ?Temp    ?Pulse 97 01/14/22 0826  ?Resp 24 01/14/22 0826  ?SpO2 100 % 01/14/22 0826  ?Vitals shown include unvalidated device data. ? ?Last Pain:  ?Vitals:  ? 01/14/22 0710  ?TempSrc: Temporal  ?PainSc: 0-No pain  ?   ? ?  ? ?Complications: No notable events documented. ?

## 2022-01-14 NOTE — Op Note (Signed)
[facility] ?Gastroenterology ?Patient Name: Belinda PlantSusan Heffler ?Procedure Date: 01/14/2022 7:34 AM ?MRN: [id number] ?Account #: [id number] ?Date of Birth: 10-05-1976 ?Admit Type: Outpatient ?Age: 46 ?[facility] ?Gender: Female ?Note Status: Finalized ?Instrument Name: Colonoscope [id number] ?Procedure:             Colonoscopy ?Indications:           Screening in patient at increased risk: Family history  ?                       of 1st-degree relative with colorectal cancer ?Providers:             Wyline MoodKiran Jeremy Ditullio MD, MD ?Referring MD:          Dorcas CarrowMegan P. Johnson (Referring MD) ?Medicines:             Monitored Anesthesia Care ?Complications:         No immediate complications. ?Procedure:             Pre-Anesthesia Assessment: ?                       - Prior to the procedure, a History and Physical was  ?                       performed, and patient medications, allergies and  ?                       sensitivities were reviewed. The patient's tolerance  ?                       of previous anesthesia was reviewed. ?                       - The risks and benefits of the procedure and the  ?                       sedation options and risks were discussed with the  ?                       patient. All questions were answered and informed  ?                       consent was obtained. ?                       - ASA Grade Assessment: II - A patient with mild  ?                       systemic disease. ?                       After obtaining informed consent, the colonoscope was  ?                       passed under direct vision. Throughout the procedure,  ?                       the patient's blood pressure, pulse, and oxygen  ?  saturations were monitored continuously. The  ?                       Colonoscope was introduced through the anus and  ?                       advanced to the the cecum, identified by the  ?                       appendiceal orifice. The colonoscopy was  performed  ?                       with ease. The patient tolerated the procedure well.  ?                       The quality of the bowel preparation was good. ?Findings: ?     The perianal and digital rectal examinations were normal. ?     The entire examined colon appeared normal on direct and retroflexion  ?     views. ?Impression:            - The entire examined colon is normal on direct and  ?                       retroflexion views. ?                       - No specimens collected. ?Recommendation:        - Discharge patient to home (with escort). ?                       - Resume previous diet. ?                       - Continue present medications. ?                       - Repeat colonoscopy in 5 years for screening purposes. ?Procedure Code(s):     --- Professional --- ?                       [phone], Colonoscopy, flexible; diagnostic, including  ?                       collection of specimen(s) by brushing or washing, when  ?                       performed (separate procedure) ?Diagnosis Code(s):     --- Professional --- ?                       Z80.0, Family history of malignant neoplasm of  ?                       digestive organs ?CPT copyright 2019 American Medical Association. All rights reserved. ?The codes documented in this report are preliminary and upon coder review may  ?be revised to meet current compliance requirements. ?Wyline Mood, MD ?Wyline Mood MD, MD ?01/14/2022 8:23:15 AM ?This report has been signed electronically. ?Number of Addenda: 0 ?Note Initiated On: 01/14/2022 7:34 AM ?Scope Withdrawal Time: 0 hours 9 minutes 35  seconds  ?Total Procedure Duration: 0 hours 14 minutes 35 seconds  ?Estimated Blood Loss:  Estimated blood loss: none. ?     [facility] ?

## 2022-01-14 NOTE — Anesthesia Postprocedure Evaluation (Signed)
Anesthesia Post Note ? ?Patient: Belinda [name] ? ?Procedure(s) Performed: COLONOSCOPY WITH PROPOFOL ?ESOPHAGOGASTRODUODENOSCOPY (EGD) ? ?Patient location during evaluation: Endoscopy ?Anesthesia Type: General ?Level of consciousness: awake and alert ?Pain management: pain level controlled ?Vital Signs Assessment: post-procedure vital signs reviewed and stable ?Respiratory status: spontaneous breathing, nonlabored ventilation and respiratory function stable ?Cardiovascular status: blood pressure returned to baseline and stable ?Postop Assessment: no apparent nausea or vomiting ?Anesthetic complications: no ? ? ?No notable events documented. ? ? ?Last Vitals:  ?Vitals:  ? 01/14/22 0843 01/14/22 0852  ?BP: 112/74 108/72  ?Pulse:    ?Resp:    ?Temp:    ?SpO2:    ?  ?Last Pain:  ?Vitals:  ? 01/14/22 0848  ?TempSrc:   ?PainSc: 6   ? ? ?  ?  ?  ?  ?  ?  ? ?Foye Deer ? ? ? ? ?

## 2022-01-14 NOTE — Op Note (Signed)
[facility] ?Gastroenterology ?Patient Name: Belinda [name] ?Procedure Date: 01/14/2022 7:34 AM ?MRN: [id number] ?Account #: [id number] ?Date of Birth: [birth year] ?Admit Type: Outpatient ?Age: 46 ?Room: [facility] ENDO ROOM 2 ?Gender: Female ?Note Status: Finalized ?Instrument Name: Upper Endoscope [id number] ?Procedure:             Upper GI endoscopy ?Indications:           Iron deficiency anemia ?Providers:             Jonathon Bellows MD, MD ?Referring MD:          Valerie Roys (Referring MD) ?Medicines:             Monitored Anesthesia Care ?Complications:         No immediate complications. ?Procedure:             Pre-Anesthesia Assessment: ?                       - Prior to the procedure, a History and Physical was  ?                       performed, and patient medications, allergies and  ?                       sensitivities were reviewed. The patient's tolerance  ?                       of previous anesthesia was reviewed. ?                       - The risks and benefits of the procedure and the  ?                       sedation options and risks were discussed with the  ?                       patient. All questions were answered and informed  ?                       consent was obtained. ?                       - ASA Grade Assessment: II - A patient with mild  ?                       systemic disease. ?                       After obtaining informed consent, the endoscope was  ?                       passed under direct vision. Throughout the procedure,  ?                       the patient's blood pressure, pulse, and oxygen  ?                       saturations were monitored continuously. The Endoscope  ?                       was  introduced through the mouth, and advanced to the  ?                       jejunum. The upper GI endoscopy was accomplished with  ?                       ease. The patient tolerated the procedure well. ?Findings: ?     The esophagus was normal. ?     Evidence of a  Roux-en-Y gastrojejunostomy was found. The gastrojejunal  ?     anastomosis was characterized by healthy appearing mucosa. This was  ?     traversed. The jejunojejunal anastomosis was characterized by healthy  ?     appearing mucosa. ?     The cardia and gastric fundus were normal on retroflexion. ?     The examined jejunum was normal. ?Impression:            - Normal esophagus. ?                       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 ?                       - Normal examined jejunum. ?                       - No specimens collected. ?Recommendation:        - Perform a colonoscopy today. ?Procedure Code(s):     --- Professional --- ?                       [phone], Esophagogastroduodenoscopy, flexible,  ?                       transoral; diagnostic, including collection of  ?                       specimen(s) by brushing or washing, when performed  ?                       (separate procedure) ?Diagnosis Code(s):     --- Professional --- ?                       Z98.0, Intestinal bypass and anastomosis status ?                       D50.9, Iron deficiency anemia, unspecified ?CPT copyright 2019 American Medical Association. All rights reserved. ?The codes documented in this report are preliminary and upon coder review may  ?be revised to meet current compliance requirements. ?Jonathon Bellows, MD ?Jonathon Bellows MD, MD ?01/14/2022 7:57:54 AM ?This report has been signed electronically. ?Number of Addenda: 0 ?Note Initiated On: 01/14/2022 7:34 AM ?Estimated Blood Loss:  Estimated blood loss: none. ?     [facility] ?

## 2022-01-15 ENCOUNTER — Encounter: Payer: Self-pay | Admitting: Gastroenterology

## 2022-01-16 ENCOUNTER — Other Ambulatory Visit: Payer: Self-pay

## 2022-01-16 ENCOUNTER — Other Ambulatory Visit: Payer: Self-pay | Admitting: Gastroenterology

## 2022-01-16 MED ORDER — PANTOPRAZOLE SODIUM 40 MG PO TBEC: 40.0000 mg | DELAYED_RELEASE_TABLET | Freq: Two times a day (BID) | ORAL | 3 refills | Status: DC

## 2022-01-18 ENCOUNTER — Other Ambulatory Visit: Payer: Self-pay | Admitting: Family Medicine

## 2022-01-20 NOTE — Telephone Encounter (Signed)
Requested Prescriptions  ?Pending Prescriptions Disp Refills  ?? nortriptyline (PAMELOR) 10 MG capsule [Pharmacy Med Name: NORTRIPTYLINE HCL 10 MG CAP] 90 capsule 0  ?  Sig: TAKE 1 CAPSULE BY MOUTH AT BEDTIME.  ?  ? Psychiatry:  Antidepressants - Heterocyclics (TCAs) Passed - 01/18/2022 11:32 AM  ?  ?  Passed - Valid encounter within last 6 months  ?  Recent Outpatient Visits   ?      ? 3 weeks ago Acute nonintractable headache, unspecified headache type  ? [facility] [place], Megan P, DO  ? 1 month ago Dizziness  ? [facility] [place], Megan P, DO  ? 1 month ago Dizziness  ? [facility], Megan P, DO  ? 5 months ago Grade II hemorrhoids  ? [facility] [place], Megan P, DO  ? 8 months ago Routine general medical examination at a health care facility  ? [facility] [place], [place] P, DO  ?  ?  ?Future Appointments   ?        ? In 1 week Laural Benes, Oralia Rud, DO [facility]  ? In 4 weeks Rickard Patience, MD [facility] [place]  ? In 3 months Wyline Mood, MD [facility] [place]  ?  ? ?  ?  ?  ? ? ?

## 2022-01-30 ENCOUNTER — Other Ambulatory Visit: Payer: Self-pay | Admitting: Family Medicine

## 2022-01-30 ENCOUNTER — Telehealth: Payer: Self-pay

## 2022-01-30 ENCOUNTER — Ambulatory Visit (INDEPENDENT_AMBULATORY_CARE_PROVIDER_SITE_OTHER): Payer: No Typology Code available for payment source | Admitting: Family Medicine

## 2022-01-30 ENCOUNTER — Encounter: Payer: Self-pay | Admitting: Family Medicine

## 2022-01-30 VITALS — BP 107/73 | HR 75 | Temp 97.7°F | Wt 135.0 lb

## 2022-01-30 DIAGNOSIS — E559 Vitamin D deficiency, unspecified: Secondary | ICD-10-CM | POA: Diagnosis not present

## 2022-01-30 DIAGNOSIS — R519 Headache, unspecified: Secondary | ICD-10-CM | POA: Diagnosis not present

## 2022-01-30 DIAGNOSIS — D509 Iron deficiency anemia, unspecified: Secondary | ICD-10-CM

## 2022-01-30 DIAGNOSIS — E538 Deficiency of other specified B group vitamins: Secondary | ICD-10-CM

## 2022-01-30 MED ORDER — NORTRIPTYLINE HCL 25 MG PO CAPS: 25.0000 mg | ORAL_CAPSULE | Freq: Every day | ORAL | 1 refills | Status: DC

## 2022-01-30 NOTE — Assessment & Plan Note (Signed)
Doing better on nortriptyline. Will increase her to 25mg  and recheck in about 3 months. Call with any concerns. Continue to monitor.  ?

## 2022-01-30 NOTE — Progress Notes (Signed)
? ?BP 107/73   Pulse 75   Temp 97.7 ?F (36.5 ?C)   Wt 135 lb (61.2 kg)   SpO2 100%   BMI 24.69 kg/m?   ? ?Subjective:  ? ? Patient ID: Belinda [name], female    DOB: May 30, 1976, 46 y.o.   MRN: [id number] ? ?HPI: ?Belinda [name] is a 46 y.o. female ? ?Chief Complaint  ?Patient presents with  ? Headache  ?  Patient states nortriptyline is helping and they are getting better. Patient states she was not contacted by scheduling for angiogram so she has not made appointment for neurology yet.   ? ?MIGRAINES ?Duration: about 2 months ?Onset: sudden ?Severity: moderate ?Quality: sharp and aching ?Frequency: 2x a week ?Location: R parietal and top of her head ?Headache duration: hours ?Radiation: no ?Time of day headache occurs: at random ?Headache status at time of visit: asymptomatic ?Treatments attempted: rest, ice, heat, APAP, ibuprofen, aleve", excedrine, and amitriptyline   ?Aura: no ?Nausea:  no ?Vomiting: no ?Photophobia:  no ?Phonophobia:  no ?Effect on social functioning:  no ?Confusion:  no ?Gait disturbance/ataxia:  no ?Behavioral changes:  no ?Fevers:  no ? ?Relevant past medical, surgical, family and social history reviewed and updated as indicated. Interim medical history since our last visit reviewed. ?Allergies and medications reviewed and updated. ? ?Review of Systems  ?Constitutional: Negative.   ?Respiratory: Negative.    ?Cardiovascular: Negative.   ?Gastrointestinal: Negative.   ?Musculoskeletal: Negative.   ?Neurological:  Positive for headaches. Negative for dizziness, tremors, seizures, syncope, facial asymmetry, speech difficulty, weakness, light-headedness and numbness.  ?Psychiatric/Behavioral: Negative.    ? ?Per HPI unless specifically indicated above ? ?   ?Objective:  ?  ?BP 107/73   Pulse 75   Temp 97.7 ?F (36.5 ?C)   Wt 135 lb (61.2 kg)   SpO2 100%   BMI 24.69 kg/m?   ?Wt Readings from Last 3 Encounters:  ?01/30/22 135 lb (61.2 kg)  ?01/14/22 132 lb 4.4 oz (60 kg)  ?01/06/22 132  lb (59.9 kg)  ?  ?Physical Exam ?Vitals and nursing note reviewed.  ?Constitutional:   ?   General: She is not in acute distress. ?   Appearance: Normal appearance. She is well-developed and normal weight. She is not ill-appearing, toxic-appearing or diaphoretic.  ?HENT:  ?   Head: Normocephalic and atraumatic.  ?   Right Ear: External ear normal.  ?   Left Ear: External ear normal.  ?   Nose: Nose normal.  ?   Mouth/Throat:  ?   Mouth: Mucous membranes are moist.  ?   Pharynx: Oropharynx is clear.  ?Eyes:  ?   General: No scleral icterus.    ?   Right eye: No discharge.     ?   Left eye: No discharge.  ?   Extraocular Movements: Extraocular movements intact.  ?   Conjunctiva/sclera: Conjunctivae normal.  ?   Pupils: Pupils are equal, round, and reactive to light.  ?Cardiovascular:  ?   Rate and Rhythm: Normal rate and regular rhythm.  ?   Pulses: Normal pulses.  ?   Heart sounds: Normal heart sounds. No murmur heard. ?  No friction rub. No gallop.  ?Pulmonary:  ?   Effort: Pulmonary effort is normal. No respiratory distress.  ?   Breath sounds: Normal breath sounds. No stridor. No wheezing, rhonchi or rales.  ?Chest:  ?   Chest wall: No tenderness.  ?Musculoskeletal:     ?  General: Normal range of motion.  ?   Cervical back: Normal range of motion and neck supple.  ?Skin: ?   General: Skin is warm and dry.  ?   Capillary Refill: Capillary refill takes less than 2 seconds.  ?   Coloration: Skin is not jaundiced or pale.  ?   Findings: No bruising, erythema, lesion or rash.  ?Neurological:  ?   General: No focal deficit present.  ?   Mental Status: She is alert and oriented to person, place, and time. Mental status is at baseline.  ?Psychiatric:     ?   Mood and Affect: Mood normal.     ?   Behavior: Behavior normal.     ?   Thought Content: Thought content normal.     ?   Judgment: Judgment normal.  ? ? ?Results for orders placed or performed during the hospital encounter of 01/14/22  ?Pregnancy, urine POC   ?Result Value Ref Range  ? Preg Test, Ur NEGATIVE NEGATIVE  ? ?   ?Assessment & Plan:  ? ?Problem List Items Addressed This Visit   ? ?  ? Other  ? Acute nonintractable headache - Primary  ?  Doing better on nortriptyline. Will increase her to 25mg  and recheck in about 3 months. Call with any concerns. Continue to monitor.  ? ?  ?  ? Relevant Medications  ? traMADol (ULTRAM) 50 MG tablet  ? nortriptyline (PAMELOR) 25 MG capsule  ? B12 deficiency  ?  Will recheck in about a month. Call with any concerns.  ? ?  ?  ? Relevant Orders  ? B12  ? IDA (iron deficiency anemia)  ?  Seeing hematology in about 2 weeks. She asks if she can get her blood work done here as it is free through [facility]. Will check with her hematologist as to what they'd like and order to be done prior to her appointment. Call with any concerns.  ? ?  ?  ? Vitamin D deficiency  ?  Will recheck in about a month. Call with any concerns.  ? ?  ?  ? Relevant Orders  ? VITAMIN D 25 Hydroxy (Vit-D Deficiency, Fractures)  ?  ? ?Follow up plan: ?Return in about 3 months (around 05/01/2022). ? ? ? ? ? ?

## 2022-01-30 NOTE — Telephone Encounter (Signed)
-----   Message from Rickard Patience, MD sent at 01/30/2022  4:08 PM EDT ----- ?Looks like she has had some labs done at the end of March. Good iron level, normal b12. Normal hemoglobin. That should be enough for me.  ?Will cancel her lab. She can keep her appt. With me thanks.  ? ? ?----- Message ----- ?From: Dorcas Carrow, DO ?Sent: 01/30/2022   9:06 AM EDT ?To: Rickard Patience, MD ? ?Hi Dr. Cathie Hoops,  ?Abel was asking me to order her labs for her since she gets them free through [facility]. I don't see what you were going to check- would you mind me putting them in prior to her appointment with you? ? ? ?

## 2022-01-30 NOTE — Assessment & Plan Note (Signed)
Seeing hematology in about 2 weeks. She asks if she can get her blood work done here as it is free through [facility]. Will check with her hematologist as to what they'd like and order to be done prior to her appointment. Call with any concerns.  ?

## 2022-01-30 NOTE — Assessment & Plan Note (Signed)
Will recheck in about a month. Call with any concerns.  ?

## 2022-01-30 NOTE — Patient Instructions (Signed)
[phone]  [phone] to schedule MRA ?

## 2022-01-30 NOTE — Assessment & Plan Note (Signed)
Will recheck in about a month. Call with any concerns.  ?

## 2022-01-30 NOTE — Telephone Encounter (Signed)
Patient informed of labs results and cancellation via Mychart.  ? ?Please cancel  lab appt on 5/8. Thanks  ?

## 2022-01-31 ENCOUNTER — Encounter: Payer: Self-pay | Admitting: Oncology

## 2022-01-31 NOTE — Telephone Encounter (Signed)
Unable to reach pt by phone. Detailed VM left of lab appt cancellation.  ?

## 2022-02-05 ENCOUNTER — Ambulatory Visit
Admission: RE | Admit: 2022-02-05 | Discharge: 2022-02-05 | Disposition: A | Discharge status: Home / Self Care | Payer: No Typology Code available for payment source | Source: Ambulatory Visit | Attending: Family Medicine | Admitting: Family Medicine

## 2022-02-05 ENCOUNTER — Other Ambulatory Visit: Payer: Self-pay | Admitting: Family Medicine

## 2022-02-05 DIAGNOSIS — R42 Dizziness and giddiness: Secondary | ICD-10-CM

## 2022-02-05 DIAGNOSIS — G43911 Migraine, unspecified, intractable, with status migrainosus: Secondary | ICD-10-CM

## 2022-02-05 DIAGNOSIS — R9389 Abnormal findings on diagnostic imaging of other specified body structures: Secondary | ICD-10-CM

## 2022-02-05 DIAGNOSIS — I671 Cerebral aneurysm, nonruptured: Secondary | ICD-10-CM

## 2022-02-05 IMAGING — MR MR MRA HEAD W/O CM
1 series · 10 of 48 positions shown · non-contrast
Comparison: None Available.

CLINICAL DATA: Possible left ICA aneurysm

EXAM:
MRA HEAD WITHOUT CONTRAST
TECHNIQUE: Angiographic images of the Circle of Willis were acquired using MRA
technique without intravenous contrast.

[Series 8: TOF · axial · 0.5mm · 0.26mm/px · z∈[-39,+24]mm · 10 of 162 slices shown]
[im 11/162]
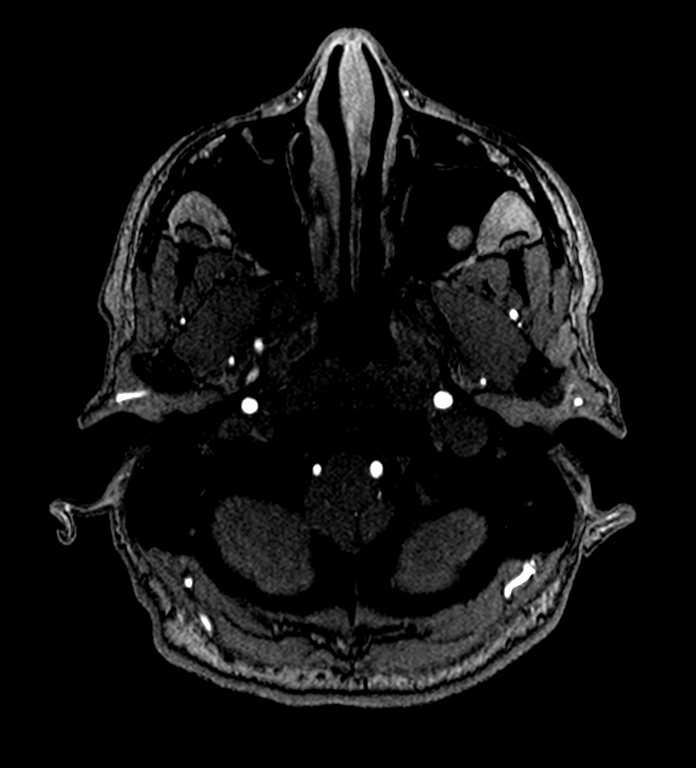
[im 28/162]
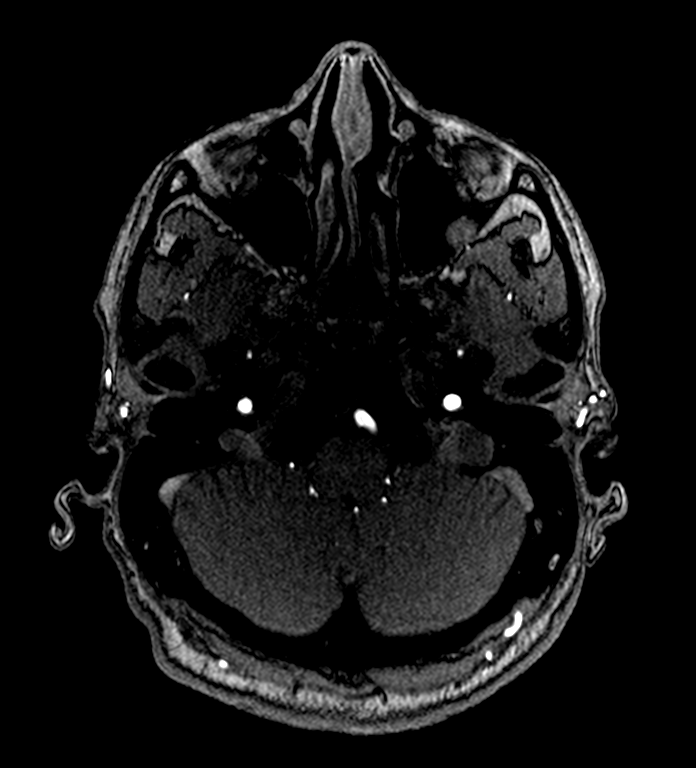
[im 31/162]
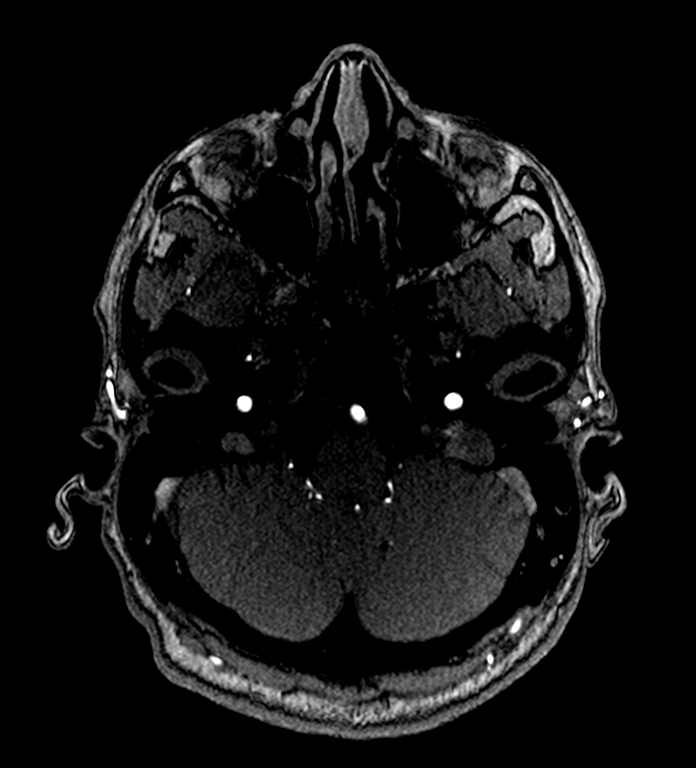
[im 52/162]
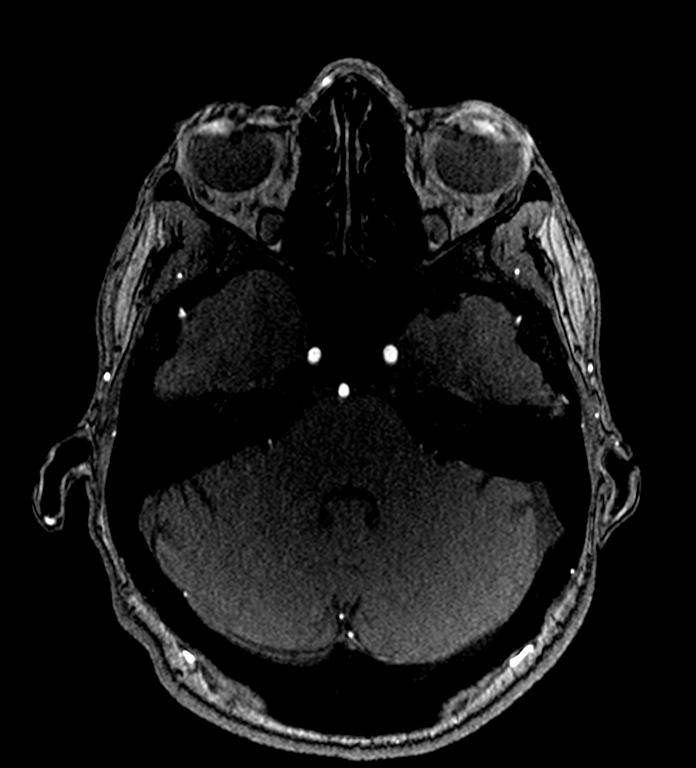
[im 72/162]
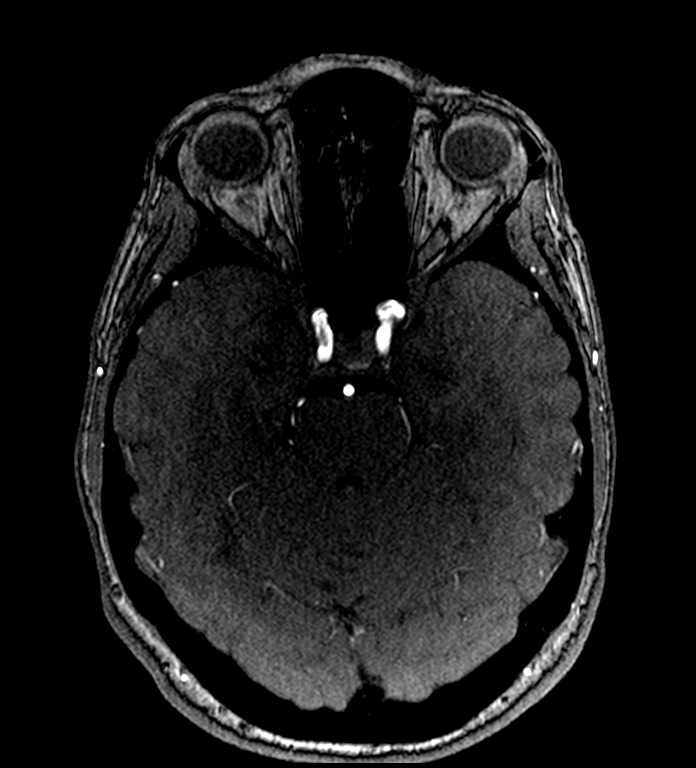
[im 83/162]
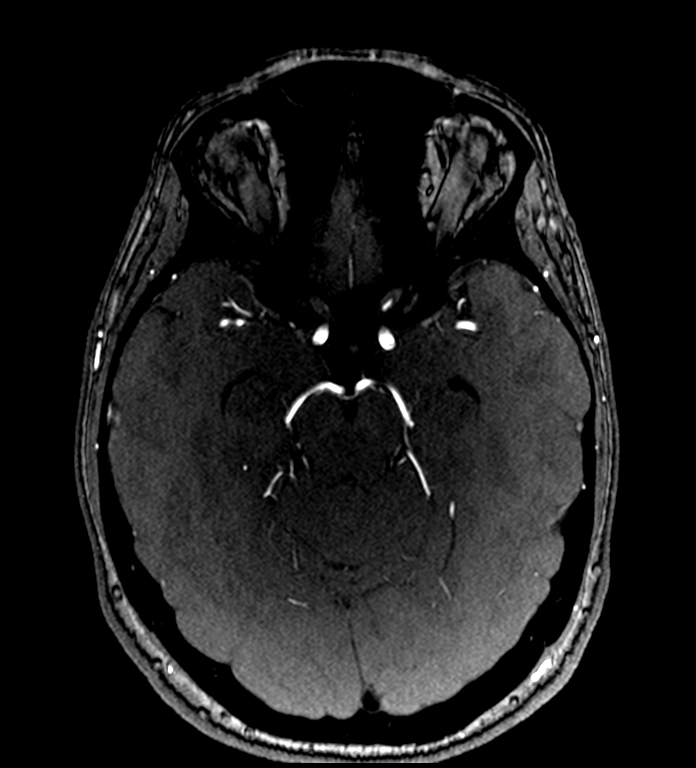
[im 93/162]
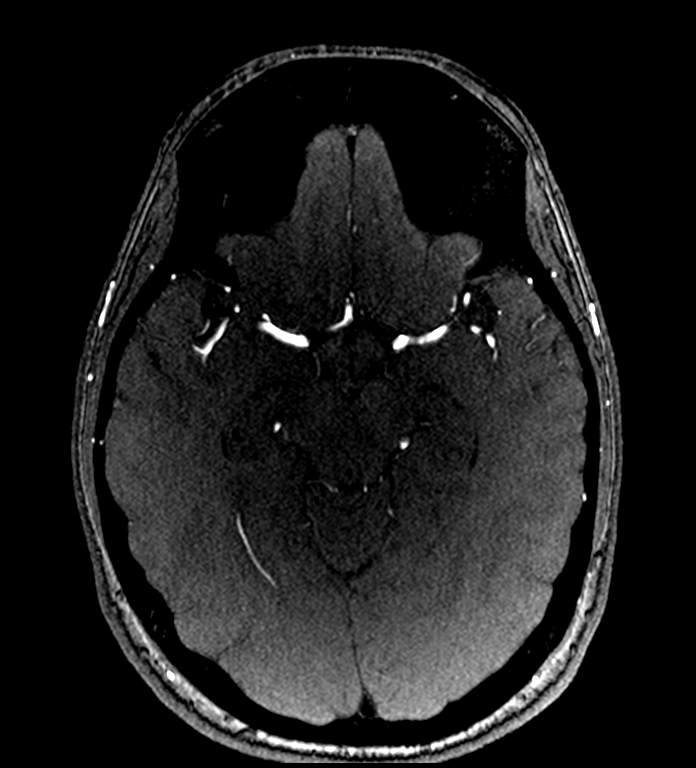
[im 114/162]
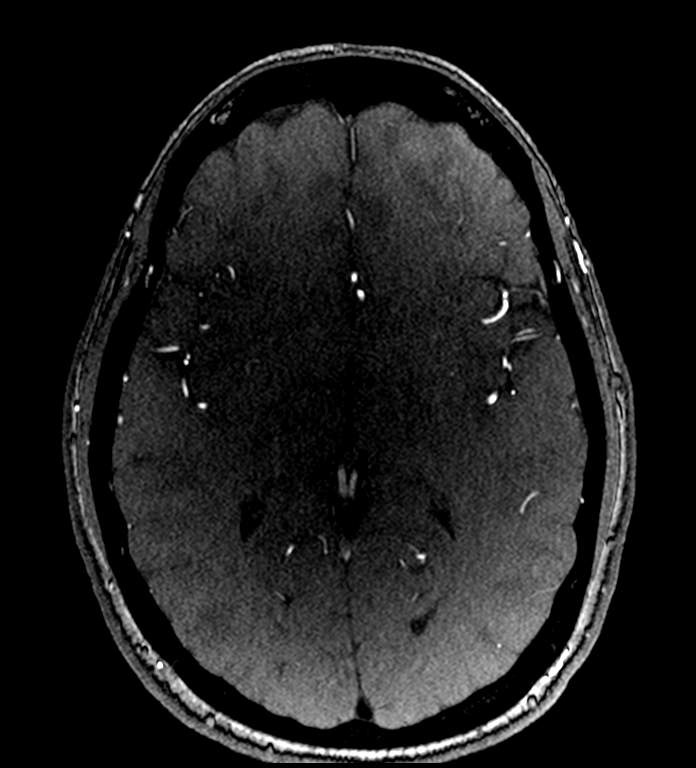
[im 134/162]
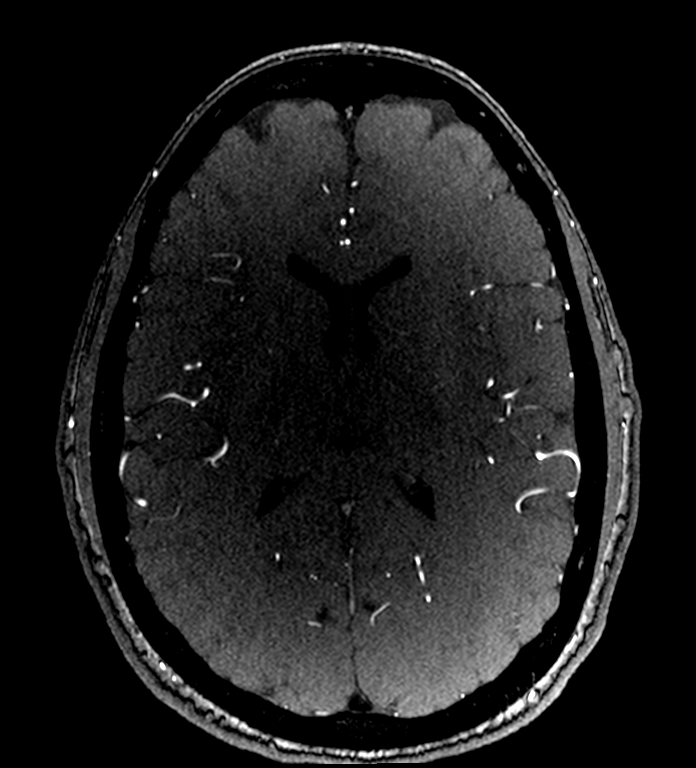
[im 138/162]
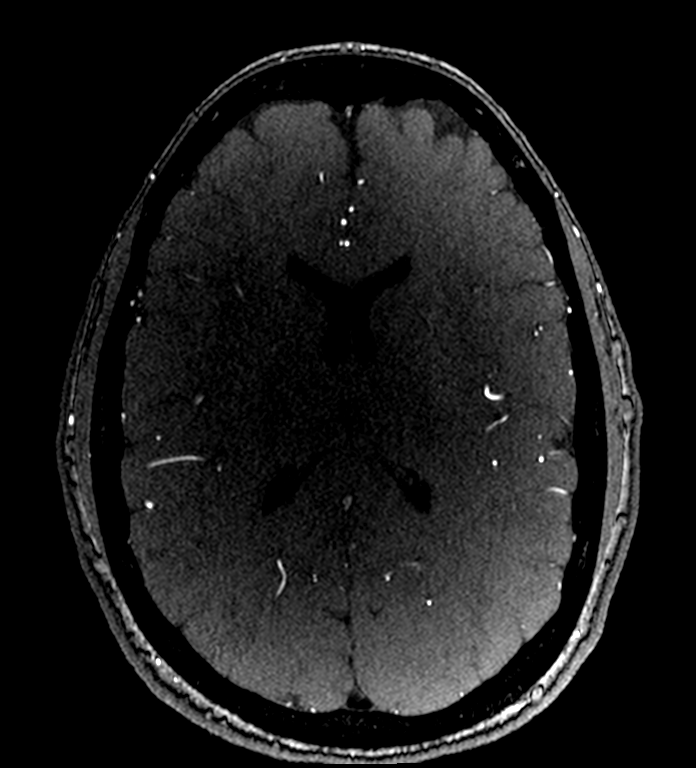

[10 of 48 positions shown; findings below may reference images not displayed]

FINDINGS: Anterior circulation: Intracranial internal carotid arteries are
patent. Anterior and middle cerebral arteries are patent. Anterior
communicating artery is present.

Posterior circulation: Intracranial vertebral arteries are patent.
Basilar artery is patent. Posterior cerebral arteries are patent.

Other: There is an approximately 3 x 3 mm wide neck inferomedially
directed outpouching from the left paraclinoid ICA essentially
opposite the left ophthalmic artery origin. Of note in the event of
any sphenoid sinus surgery, there is intersphenoid sinus septal
insertion at this location.
IMPRESSION: Approximately 3 mm wide neck inferomedially directed aneurysm of the
left paraclinoid ICA.

## 2022-02-06 ENCOUNTER — Ambulatory Visit: Payer: No Typology Code available for payment source | Admitting: Gastroenterology

## 2022-02-10 ENCOUNTER — Other Ambulatory Visit: Payer: No Typology Code available for payment source

## 2022-02-17 ENCOUNTER — Inpatient Hospital Stay: Payer: No Typology Code available for payment source | Attending: Oncology | Admitting: Oncology

## 2022-02-17 ENCOUNTER — Encounter: Payer: Self-pay | Admitting: Oncology

## 2022-02-17 DIAGNOSIS — Z9884 Bariatric surgery status: Secondary | ICD-10-CM

## 2022-02-17 DIAGNOSIS — E559 Vitamin D deficiency, unspecified: Secondary | ICD-10-CM | POA: Diagnosis not present

## 2022-02-17 DIAGNOSIS — E538 Deficiency of other specified B group vitamins: Secondary | ICD-10-CM

## 2022-02-17 DIAGNOSIS — D509 Iron deficiency anemia, unspecified: Secondary | ICD-10-CM | POA: Diagnosis not present

## 2022-02-17 NOTE — Progress Notes (Signed)
HEMATOLOGY-ONCOLOGY TeleHEALTH VISIT PROGRESS NOTE  ?I connected with Belinda [name] on 02/17/22 at  2:45 PM EDT by video enabled telemedicine visit and verified that I am speaking with the correct person using two identifiers. ?I discussed the limitations, risks, security and privacy concerns of performing an evaluation and management service by telemedicine and the availability of in-person appointments. I also discussed with the patient that there may be a patient responsible charge related to this service. The patient expressed understanding and agreed to proceed.  ? ?Other persons participating in the visit and their role in the encounter:  ?None ? ?Patient's location: Home  ?Provider's location: office ?Chief Complaint: Follow-up for iron deficiency anemia, gastric bypass. ? ? ?INTERVAL HISTORY ?Belinda [name] is a 46 y.o. female who has above history reviewed by me today presents for follow up visit for management of iron deficiency anemia and gastric bypass. ?Patient reports feeling well.  No new complaints ?She had a blood work done at [facility] ordered by primary care provider. ?Presents for follow-up.  No new complaints. ? ?Review of Systems  ?Constitutional:  Negative for appetite change, chills, fatigue, fever and unexpected weight change.  ?HENT:   Negative for hearing loss and voice change.   ?Eyes:  Negative for eye problems.  ?Respiratory:  Negative for chest tightness and cough.   ?Cardiovascular:  Negative for chest pain.  ?Gastrointestinal:  Negative for abdominal distention, abdominal pain, blood in stool and nausea.  ?Endocrine: Negative for hot flashes.  ?Genitourinary:  Negative for difficulty urinating and frequency.   ?Musculoskeletal:  Negative for arthralgias.  ?Skin:  Negative for itching and rash.  ?Neurological:  Negative for extremity weakness.  ?Hematological:  Negative for adenopathy.  ?Psychiatric/Behavioral:  Negative for confusion.   ? ?  ?Past Medical History:  ?Diagnosis  Date  ? Anemia   ? Anxiety   ? Arthritis   ? lower back  ? Bipolar affective disorder ([place])   ? Controlled substance agreement signed 10/16/2017  ? Deviated septum   ? Diabetes mellitus without complication ([place])   ? GERD (gastroesophageal reflux disease)   ? Headache(784.0)   ? migraines - none 2 months  ? Low iron   ? Peptic ulcer   ? Pre-diabetes   ? Schizophrenia ([place])   ? Sleep apnea   ? Substance abuse ([place])   ? ?Past Surgical History:  ?Procedure Laterality Date  ? COLONOSCOPY WITH PROPOFOL N/A 01/14/2022  ? Procedure: COLONOSCOPY WITH PROPOFOL;  Surgeon: Jonathon Bellows, MD;  Location: [facility];  Service: Gastroenterology;  Laterality: N/A;  ? ESOPHAGOGASTRODUODENOSCOPY  08/05/2012  ? Procedure: ESOPHAGOGASTRODUODENOSCOPY (EGD);  Surgeon: Shann Medal, MD;  Location: [facility];  Service: General;  Laterality: N/A;  ? ESOPHAGOGASTRODUODENOSCOPY N/A 01/14/2022  ? Procedure: ESOPHAGOGASTRODUODENOSCOPY (EGD);  Surgeon: Jonathon Bellows, MD;  Location: [facility] ENDOSCOPY;  Service: Gastroenterology;  Laterality: N/A;  ? ESOPHAGOGASTRODUODENOSCOPY (EGD) WITH PROPOFOL N/A 08/06/2016  ? Procedure: ESOPHAGOGASTRODUODENOSCOPY (EGD) WITH PROPOFOL;  Surgeon: Jonathon Bellows, MD;  Location: [facility];  Service: Endoscopy;  Laterality: N/A;  ? ESOPHAGOGASTRODUODENOSCOPY (EGD) WITH PROPOFOL N/A 11/07/2016  ? Procedure: ESOPHAGOGASTRODUODENOSCOPY (EGD) WITH PROPOFOL;  Surgeon: Jonathon Bellows, MD;  Location: [facility] ENDOSCOPY;  Service: Endoscopy;  Laterality: N/A;  ? 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 Procedure: LAPAROSCOPIC ROUX-EN-Y GASTRIC;  Surgeon: Edward Jolly, MD;  Location: [facility];  Service: General;  Laterality: N/A;  upper endoscopy  ? SEPTOPLASTY  2003  ?  ?Family History  ?Problem Relation Age of Onset  ?  Diabetes Mother   ? Cancer Mother 69  ?     brain, skin  ? Cancer Father 86  ?     kidney, bone, rectal  ? Hyperlipidemia Father   ? Hypertension Father   ? Stroke Father   ? Allergies Sister   ? Lupus Sister   ?  Breast cancer Maternal Aunt 50  ? Cancer Paternal Aunt   ?     breast  ? Diabetes Brother   ? Congenital heart disease Brother   ?  ?Social History  ? ?Socioeconomic History  ? Marital status: Legally Separated  ?  Spouse name: Not on file  ? Number of children: Not on file  ? Years of education: Not on file  ? Highest education level: Not on file  ?Occupational History  ? Occupation: unemployed  ?Tobacco Use  ? Smoking status: Former  ?  Types: Cigarettes  ?  Quit date: 04/03/2017  ?  Years since quitting: 4.8  ? Smokeless tobacco: Never  ? Tobacco comments:  ?  only smoked for about 60months   ?Vaping Use  ? Vaping Use: Never used  ?Substance and Sexual Activity  ? Alcohol use: Not Currently  ?  Alcohol/week: 3.0 standard drinks  ?  Types: 3 Standard drinks or equivalent per week  ?  Comment: a few times a week  ? Drug use: Not Currently  ?  Types: Marijuana  ? Sexual activity: Yes  ?  Birth control/protection: None  ?Other Topics Concern  ? Not on file  ?Social History Narrative  ? Not on file  ? ?Social Determinants of Health  ? ?Financial Resource Strain: Not on file  ?Food Insecurity: Not on file  ?Transportation Needs: Not on file  ?Physical Activity: Not on file  ?Stress: Not on file  ?Social Connections: Not on file  ?Intimate Partner Violence: Not on file  ?  ?Current Outpatient Medications on File Prior to Visit  ?Medication Sig Dispense Refill  ? B-D 3CC LUER-LOK SYR 25GX1" 25G X 1" 3 ML MISC     ? cyanocobalamin (,VITAMIN B-12,) 1000 MCG/ML injection Inject 1 mL (1,000 mcg total) into the muscle once a week. 12 mL 4  ? lidocaine (LIDODERM) 5 % Place 1 patch onto the skin daily. Remove & Discard patch within 12 hours or as directed by MD 30 patch 0  ? metaxalone (SKELAXIN) 800 MG tablet Take 1 tablet (800 mg total) by mouth 3 (three) times daily as needed for muscle spasms. 60 tablet 3  ? nortriptyline (PAMELOR) 25 MG capsule Take 1 capsule (25 mg total) by mouth at bedtime. 90 capsule 1  ? ondansetron  (ZOFRAN) 4 MG tablet Take 1 tablet (4 mg total) by mouth every 8 (eight) hours as needed. for nausea 20 tablet 5  ? pantoprazole (PROTONIX) 40 MG tablet Take 1 tablet (40 mg total) by mouth 2 (two) times daily. 180 tablet 3  ? Probiotic Product ([employer] MOOD SUPPORT PROBIOTIC PO) Take by mouth daily.    ? traMADol (ULTRAM) 50 MG tablet tramadol 50 mg tablet ? Take 1 tablet every 6-8 hours by oral route as needed.    ? Vitamin D, Ergocalciferol, (DRISDOL) 1.25 MG (50000 UNIT) CAPS capsule Take 1 capsule (50,000 Units total) by mouth every 7 (seven) days. 12 capsule 3  ? ?No current facility-administered medications on file prior to visit.  ?  ?Allergies  ?Allergen Reactions  ? Acetaminophen Nausea Only  ? Valproic Acid Other (See Comments)  ?  Tremors  ? Depakote [Divalproex Sodium] Other (See Comments)  ?  Tremors  ?  ? ?  ?Observations/Objective: ?There were no vitals filed for this visit. ?There is no height or weight on file to calculate BMI.  ?Physical Exam ?Constitutional:   ?   General: She is not in acute distress. ?Neurological:  ?   Mental Status: She is alert.  ?Psychiatric:     ?   Mood and Affect: Mood normal.  ? ? ?CBC ?   ?Component Value Date/Time  ? WBC 6.0 12/27/2021 1430  ? WBC 8.6 12/16/2021 1856  ? RBC 4.05 12/27/2021 1430  ? RBC 4.17 12/16/2021 1856  ? HGB 13.1 12/27/2021 1430  ? HCT 39.0 12/27/2021 1430  ? PLT 220 12/27/2021 1430  ? MCV 96 12/27/2021 1430  ? MCH 32.3 12/27/2021 1430  ? MCH 32.1 12/16/2021 1856  ? MCHC 33.6 12/27/2021 1430  ? MCHC 33.5 12/16/2021 1856  ? RDW 12.8 12/27/2021 1430  ? LYMPHSABS 2.5 12/27/2021 1430  ? MONOABS 0.7 12/16/2021 1856  ? EOSABS 0.1 12/27/2021 1430  ? BASOSABS 0.0 12/27/2021 1430  ?  ?CMP  ?   ?Component Value Date/Time  ? NA 137 12/16/2021 1856  ? NA 139 11/22/2021 1141  ? K 4.1 12/16/2021 1856  ? CL 105 12/16/2021 1856  ? CO2 25 12/16/2021 1856  ? GLUCOSE 80 12/16/2021 1856  ? BUN 14 12/16/2021 1856  ? BUN 15 11/22/2021 1141  ? CREATININE 0.80 12/16/2021  1856  ? CREATININE 0.88 11/14/2011 1728  ? CALCIUM 9.3 12/16/2021 1856  ? PROT 7.3 12/16/2021 1856  ? PROT 6.1 11/22/2021 1141  ? ALBUMIN 4.6 12/16/2021 1856  ? ALBUMIN 4.3 11/22/2021 1141  ? AST 21 03/13/202

## 2022-02-17 NOTE — Progress Notes (Signed)
Unable to reach pt multiple time to review chart piror to appt.  ?

## 2022-02-19 DIAGNOSIS — Z01818 Encounter for other preprocedural examination: Secondary | ICD-10-CM | POA: Insufficient documentation

## 2022-02-19 DIAGNOSIS — I720 Aneurysm of carotid artery: Secondary | ICD-10-CM | POA: Insufficient documentation

## 2022-02-19 DIAGNOSIS — Z9884 Bariatric surgery status: Secondary | ICD-10-CM | POA: Insufficient documentation

## 2022-02-20 DIAGNOSIS — K219 Gastro-esophageal reflux disease without esophagitis: Secondary | ICD-10-CM | POA: Insufficient documentation

## 2022-02-23 ENCOUNTER — Other Ambulatory Visit: Payer: Self-pay | Admitting: Family Medicine

## 2022-02-25 ENCOUNTER — Ambulatory Visit: Payer: No Typology Code available for payment source | Admitting: Family Medicine

## 2022-02-25 ENCOUNTER — Encounter: Payer: Self-pay | Admitting: Family Medicine

## 2022-02-25 DIAGNOSIS — D509 Iron deficiency anemia, unspecified: Secondary | ICD-10-CM

## 2022-02-25 NOTE — Telephone Encounter (Signed)
Requested medication (s) are due for refill today - yes  Requested medication (s) are on the active medication list -yes  Future visit scheduled -yes  Last refill: 05/02/21 #20 5RF  Notes to clinic: non delegated Rx  Requested Prescriptions  Pending Prescriptions Disp Refills   ondansetron (ZOFRAN) 4 MG tablet [Pharmacy Med Name: ONDANSETRON HCL 4 MG TABLET] 20 tablet 5    Sig: Take 1 tablet (4 mg total) by mouth every 8 (eight) hours as needed. for nausea     Not Delegated - Gastroenterology: Antiemetics - ondansetron Failed - 02/23/2022  4:07 PM      Failed - This refill cannot be delegated      Passed - AST in normal range and within 360 days    AST  Date Value Ref Range Status  12/16/2021 21 15 - 41 U/L Final         Passed - ALT in normal range and within 360 days    ALT  Date Value Ref Range Status  12/16/2021 22 0 - 44 U/L Final         Passed - Valid encounter within last 6 months    Recent Outpatient Visits           3 weeks ago Acute nonintractable headache, unspecified headache type   [facility], Megan P, DO   2 months ago Acute nonintractable headache, unspecified headache type   [facility] [place], [place], DO   2 months ago Dizziness   [facility] [address], [place], DO   3 months ago Dizziness   [facility] [place], [place], DO   6 months ago Grade II hemorrhoids   [facility] [place], [place], DO       Future Appointments             In 1 month Wyline Mood, MD [facility]   In 2 months Laural Benes, Megan P, DO [facility]   In 5 months Rickard Patience, MD [facility]                Requested Prescriptions  Pending Prescriptions Disp Refills   ondansetron (ZOFRAN) 4 MG tablet [Pharmacy Med Name: ONDANSETRON HCL 4 MG TABLET] 20 tablet 5    Sig: Take 1 tablet (4 mg total) by mouth every 8 (eight) hours as needed. for  nausea     Not Delegated - Gastroenterology: Antiemetics - ondansetron Failed - 02/23/2022  4:07 PM      Failed - This refill cannot be delegated      Passed - AST in normal range and within 360 days    AST  Date Value Ref Range Status  12/16/2021 21 15 - 41 U/L Final         Passed - ALT in normal range and within 360 days    ALT  Date Value Ref Range Status  12/16/2021 22 0 - 44 U/L Final         Passed - Valid encounter within last 6 months    Recent Outpatient Visits           3 weeks ago Acute nonintractable headache, unspecified headache type   [facility], Megan P, DO   2 months ago Acute nonintractable headache, unspecified headache type   [facility], Megan P, DO   2 months ago Dizziness   [facility] [place], [place], DO  3 months ago Dizziness   [facility] [place], [place], DO   6 months ago Grade II hemorrhoids   [facility], [place], DO       Future Appointments             In 1 month Wyline Mood, MD [facility]   In 2 months Laural Benes, Oralia Rud, DO [facility], [facility]   In 5 months Rickard Patience, MD [facility]

## 2022-03-04 NOTE — Telephone Encounter (Signed)
Called patient to make her aware. Went straight to vm.

## 2022-03-04 NOTE — Telephone Encounter (Signed)
OK for orders. Labs in. OK To cancel appt for tomorrow.

## 2022-03-05 ENCOUNTER — Telehealth: Payer: No Typology Code available for payment source | Admitting: Family Medicine

## 2022-04-23 ENCOUNTER — Encounter: Payer: Self-pay | Admitting: *Deleted

## 2022-04-23 ENCOUNTER — Emergency Department
Admission: EM | Admit: 2022-04-23 | Discharge: 2022-04-23 | Disposition: A | Discharge status: Home / Self Care | Payer: No Typology Code available for payment source | Attending: Emergency Medicine | Admitting: Emergency Medicine

## 2022-04-23 ENCOUNTER — Ambulatory Visit: Payer: Self-pay | Admitting: *Deleted

## 2022-04-23 ENCOUNTER — Emergency Department: Payer: No Typology Code available for payment source

## 2022-04-23 ENCOUNTER — Other Ambulatory Visit: Payer: Self-pay

## 2022-04-23 DIAGNOSIS — M79601 Pain in right arm: Secondary | ICD-10-CM | POA: Insufficient documentation

## 2022-04-23 DIAGNOSIS — E119 Type 2 diabetes mellitus without complications: Secondary | ICD-10-CM | POA: Insufficient documentation

## 2022-04-23 MED ORDER — IBUPROFEN 400 MG PO TABS
400.0000 mg | ORAL_TABLET | Freq: Once | ORAL | Status: AC
Start: 2022-04-23 — End: 2022-04-23
  Administered 2022-04-23: 400 mg via ORAL
  Filled 2022-04-23: qty 1

## 2022-04-23 NOTE — ED Notes (Signed)
E-signature pad unavailable - Pt verbalized understanding of D/C information - no additional concerns at this time.  

## 2022-04-23 NOTE — ED Provider Notes (Signed)
[facility] Provider Note    Event Date/Time   First MD Initiated Contact with Patient 04/23/22 1845     (approximate)   History   Arm Pain   HPI  Belinda [name] is a 46 y.o. 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  She has no other areas of pain.  No rashes or overlying skin changes.  She has no pain above the area or below.  No prior similar episodes.  She took some Goody powders earlier today but no other analgesia.    Past Medical History:  Diagnosis Date   Anemia    Anxiety    Arthritis    lower back   Bipolar affective disorder ([facility])    Controlled substance agreement signed 10/16/2017   Deviated septum    Diabetes mellitus without complication ([facility])    GERD (gastroesophageal reflux disease)    Headache(784.0)    migraines - none 2 months   Low iron    Peptic ulcer    Pre-diabetes    Schizophrenia ([facility])    Sleep apnea    Substance abuse [facility])      Physical Exam  Triage Vital Signs: ED Triage Vitals  Enc Vitals Group     BP 04/23/22 1843 121/81     Pulse Rate 04/23/22 1843 (!) 54     Resp 04/23/22 1843 18     Temp 04/23/22 1843 98.3 F (36.8 C)     Temp Source 04/23/22 1843 Oral     SpO2 04/23/22 1843 100 %     Weight 04/23/22 1839 132 lb (59.9 kg)     Height 04/23/22 1839 5\' 2"  (1.575 m)     Head Circumference --      Peak Flow --      Pain Score 04/23/22 1839 6     Pain Loc --      Pain Edu? --      Excl. in GC? --     Most recent vital signs: Vitals:   04/23/22 1843 04/23/22 2038  BP: 121/81 124/73  Pulse: (!) 54 60  Resp: 18 18  Temp: 98.3 F (36.8 C) 98 F (36.7 C)  SpO2: 100% 100%    General: Awake, no distress.  CV:  Good peripheral perfusion.  2+  radial pulse.   Resp:  Normal effort.  Clear bilaterally. Abd:  No distention.  Other:  There is nonfluctuant less than 1 cm lump under the right posterior mid forearm.  There is no induration, fluctuance, edema or changes proximally distally.  There is no effusion at the elbow or wrist.  Patient has symmetric strength in the right hand compared to left.  Sensation is intact in the distribution of the radial ulnar and median nerves.  No other edema around the forearm   ED Results / Procedures / Treatments  Labs (all labs ordered are listed, but only abnormal results are displayed) Labs Reviewed - No data to display   EKG    RADIOLOGY  Soft tissue ultrasound interpreted by myself shows a small hypoechoic area concerning a possible hematoma or abscess.  I reviewed radiologist rotation and agree with their findings.  No surrounding cobblestoning or other surrounding changes.    PROCEDURES:  Critical Care performed:  No  Procedures   MEDICATIONS ORDERED IN ED: Medications  ibuprofen (ADVIL) tablet 400 mg (400 mg Oral Given 04/23/22 1859)     IMPRESSION / MDM / ASSESSMENT AND PLAN / ED COURSE  I reviewed the triage vital signs and the nursing notes. Patient's presentation is most consistent with acute, uncomplicated illness.                               Differential diagnosis includes, but is not limited to cyst, lipoma, hematoma, versus muscle spasm.  Overall clinical picture is not suggestive of abscess or DVT at this time.  She is neurovascular intact distally.  No history of recent trauma.  Soft tissue ultrasound interpreted by myself shows a small hypoechoic area concerning a possible hematoma or abscess.  I reviewed radiologist rotation and agree with their findings.  No surrounding cobblestoning or other surrounding changes.   Suspicion for abscess at this time.  Suspect possible hematoma.  Advised patient to return for any new or worsening symptoms.  I think at this  point she is stable for discharge with outpatient check with her PCP in 5 to 7 days.  Discharged in stable condition.  Strict and precautions advised and discussed.      FINAL CLINICAL IMPRESSION(S) / ED DIAGNOSES   Final diagnoses:  Right arm pain     Rx / DC Orders   ED Discharge Orders     None        Note:  This document was prepared using Dragon voice recognition software and may include unintentional dictation errors.   Gilles Chiquito, MD 04/23/22 2102

## 2022-04-23 NOTE — ED Notes (Signed)
Pt back from US

## 2022-04-23 NOTE — ED Notes (Signed)
Pt to US.

## 2022-04-23 NOTE — ED Triage Notes (Signed)
Pt has pain in right forearm with swelling   no known injury.  Sx began this am.  Pt alert  speech clear.

## 2022-04-23 NOTE — Discharge Instructions (Signed)
I suspect you may have a hematoma in this area or given pain.  There is no surrounding redness or other skin changes to suggest an abscess or infection at this time.  If you do experience that any of these changes please be seen immediately by your PCP or return to emergency room as you will likely need antibiotics at this time.  In the meantime I think it is appropriate to schedule an outpatient follow-up visit in 5 to 7 days to have it checked and please return to the emergency room if you get any new or worsening of your symptoms.

## 2022-04-23 NOTE — Telephone Encounter (Signed)
  Chief Complaint: Lump Symptoms: Lump "Little smaller than golf ball" right forearm. Noted this AM. Very tender to touch, with movement, hard in middle. No injury, did not bump arm. 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 Frequency: today Pertinent Negatives: Patient denies redness Disposition: [x] ED /[] Urgent Care (no appt availability in office) / [] Appointment(In office/virtual)/ []  [facility]/ [] Home Care/ [] Refused Recommended Disposition /[] [facility] [facility]/ []  Follow-up with PCP Additional Notes: Advised ED. Pt states will follow disposition. After hours call. Reason for Disposition  Patient sounds very sick or weak to the triager  Answer Assessment - Initial Assessment Questions 1. APPEARANCE of SWELLING: "What does it look like?"     Middle part hard 2. SIZE: "How large is the swelling?" (e.g., inches, cm; or compare to size of pinhead, tip of pen, eraser, coin, pea, grape, ping pong ball)     Little smaller than golf ball 3. LOCATION: "Where is the swelling located?"     right 4. ONSET: "When did the swelling start?"     This AM 5. COLOR: "What color is it?" "Is there more than one color?"     Skin 6. PAIN: "Is there any pain?" If Yes, ask: "How bad is the pain?" (e.g., scale 1-10; or mild, moderate, severe)     - NONE (0): no pain   - MILD (1-3): doesn't interfere with normal activities    - MODERATE (4-7): interferes with normal activities or awakens from sleep    - SEVERE (8-10): excruciating pain, unable to do any normal activities     Tender to touch 7-8/10  5-6/10 with movement. 7. ITCH: "Does it itch?" If Yes, ask: "How bad is the itch?"      No 8. CAUSE: "What do you think caused the swelling?" No injury "Maybe blood clot." 9 OTHER SYMPTOMS: "Do you have any other symptoms?" (e.g., fever)     No  Protocols used: Skin Lump or Localized  Swelling-A-AH

## 2022-04-24 ENCOUNTER — Ambulatory Visit: Payer: No Typology Code available for payment source | Admitting: Gastroenterology

## 2022-04-24 NOTE — Telephone Encounter (Signed)
Patient seen at ER

## 2022-04-30 ENCOUNTER — Telehealth: Payer: Self-pay

## 2022-04-30 NOTE — Telephone Encounter (Signed)
Patient has scheduled appointment for 05/02/22 for physical. Patient will be too early due to having physical on 05/02/21 last year, needs to be one year and a day for insurance to cover physical. Appointment needs to be rescheduled. NA LVM to call patient back.

## 2022-05-01 ENCOUNTER — Ambulatory Visit: Payer: No Typology Code available for payment source | Admitting: Family Medicine

## 2022-05-02 ENCOUNTER — Encounter: Payer: No Typology Code available for payment source | Admitting: Family Medicine

## 2022-05-13 ENCOUNTER — Ambulatory Visit (INDEPENDENT_AMBULATORY_CARE_PROVIDER_SITE_OTHER): Payer: No Typology Code available for payment source | Admitting: Family Medicine

## 2022-05-13 ENCOUNTER — Encounter: Payer: Self-pay | Admitting: Family Medicine

## 2022-05-13 ENCOUNTER — Other Ambulatory Visit (HOSPITAL_COMMUNITY)
Admission: RE | Admit: 2022-05-13 | Discharge: 2022-05-13 | Disposition: A | Discharge status: Home / Self Care | Payer: No Typology Code available for payment source | Source: Ambulatory Visit | Attending: Family Medicine | Admitting: Family Medicine

## 2022-05-13 VITALS — BP 103/68 | HR 63 | Temp 98.1°F | Ht 62.0 in | Wt 134.6 lb

## 2022-05-13 DIAGNOSIS — Z0000 Encounter for general adult medical examination without abnormal findings: Secondary | ICD-10-CM

## 2022-05-13 DIAGNOSIS — Z1231 Encounter for screening mammogram for malignant neoplasm of breast: Secondary | ICD-10-CM | POA: Diagnosis not present

## 2022-05-13 MED ORDER — ASPIRIN 81 MG PO TBEC: 81.0000 mg | DELAYED_RELEASE_TABLET | Freq: Every day | ORAL | 12 refills | Status: AC

## 2022-05-13 NOTE — Progress Notes (Signed)
BP 103/68   Pulse 63   Temp 98.1 F (36.7 C)   Ht 5\' 2"  (1.575 m)   Wt 134 lb 9.6 oz (61.1 kg)   LMP 04/19/2022 (Approximate)   SpO2 100%   BMI 24.62 kg/m    Subjective:    Patient ID: 04/21/2022, female    DOB: 18-Apr-1976, 46 y.o.   MRN: [id number]  HPI: Belinda [name] is a 46 y.o. female presenting on 05/13/2022 for comprehensive medical examination. Current medical complaints include:{Blank single:19197::"none","***"}  She currently lives with: husband  Menopausal Symptoms: no  Depression Screen done today and results listed below:     05/13/2022    4:10 PM 12/27/2021    1:58 PM 12/04/2021    9:16 AM 05/02/2021    8:27 AM 10/17/2020    9:54 AM  Depression screen PHQ 2/9  Decreased Interest 0 0 0 0 0  Down, Depressed, Hopeless 0 0 0 0 0  PHQ - 2 Score 0 0 0 0 0  Altered sleeping 1 1 1     Tired, decreased energy 0 1 2    Change in appetite 1 0 1    Feeling bad or failure about yourself  0 0 0    Trouble concentrating 0 0 0    Moving slowly or fidgety/restless 0 0 0    Suicidal thoughts 0 0 0    PHQ-9 Score 2 2 4     Difficult doing work/chores Not difficult at all        Past Medical History:  Past Medical History:  Diagnosis Date  . Anemia   . Anxiety   . Arthritis    lower back  . Bipolar affective disorder (HCC)   . Controlled substance agreement signed 10/16/2017  . Deviated septum   . Diabetes mellitus without complication (HCC)   . GERD (gastroesophageal reflux disease)   . Headache(784.0)    migraines - none 2 months  . Low iron   . Peptic ulcer   . Pre-diabetes   . Schizophrenia (HCC)   . Sleep apnea   . Substance abuse [facility])     Surgical History:  Past Surgical History:  Procedure Laterality Date  . COLONOSCOPY WITH PROPOFOL N/A 01/14/2022   Procedure: COLONOSCOPY WITH PROPOFOL;  Surgeon: 12/14/2017, MD;  Location: [facility] ENDOSCOPY;  Service: Gastroenterology;  Laterality: N/A;  . ESOPHAGOGASTRODUODENOSCOPY  08/05/2012   Procedure:  ESOPHAGOGASTRODUODENOSCOPY (EGD);  Surgeon: Wyline Mood, MD;  Location: [facility] ENDOSCOPY;  Service: General;  Laterality: N/A;  . ESOPHAGOGASTRODUODENOSCOPY N/A 01/14/2022   Procedure: ESOPHAGOGASTRODUODENOSCOPY (EGD);  Surgeon: Kandis Cocking, MD;  Location: [facility] ENDOSCOPY;  Service: Gastroenterology;  Laterality: N/A;  . ESOPHAGOGASTRODUODENOSCOPY (EGD) WITH PROPOFOL N/A 08/06/2016   Procedure: ESOPHAGOGASTRODUODENOSCOPY (EGD) WITH PROPOFOL;  Surgeon: Wyline Mood, MD;  Location: [facility]ERY [facility];  Service: Endoscopy;  Laterality: N/A;  . ESOPHAGOGASTRODUODENOSCOPY (EGD) WITH PROPOFOL N/A 11/07/2016   Procedure: ESOPHAGOGASTRODUODENOSCOPY (EGD) WITH PROPOFOL;  Surgeon: Wyline Mood, MD;  Location: [facility] ENDOSCOPY;  Service: Endoscopy;  Laterality: N/A;  . 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/2011   Procedure: LAPAROSCOPIC ROUX-EN-Y GASTRIC;  Surgeon: 01/05/2017, MD;  Location: [facility] ORS;  Service: General;  Laterality: N/A;  upper endoscopy  . SEPTOPLASTY  2003    Medications:  Current Outpatient Medications on File Prior to Visit  Medication Sig  . B-D 3CC LUER-LOK SYR 25GX1" 25G X 1" 3 ML MISC   . cyanocobalamin (,VITAMIN B-12,) 1000 MCG/ML injection Inject 1 mL (1,000 mcg total) into the  muscle once a week.  . lidocaine (LIDODERM) 5 % Place 1 patch onto the skin daily. Remove & Discard patch within 12 hours or as directed by MD  . nortriptyline (PAMELOR) 25 MG capsule Take 1 capsule (25 mg total) by mouth at bedtime.  . ondansetron (ZOFRAN) 4 MG tablet TAKE 1 TABLET (4 MG TOTAL) BY MOUTH EVERY 8 (EIGHT) HOURS AS NEEDED. FOR NAUSEA  . Probiotic Product ([employer] MOOD SUPPORT PROBIOTIC PO) Take by mouth daily.  . Vitamin D, Ergocalciferol, (DRISDOL) 1.25 MG (50000 UNIT) CAPS capsule Take 1 capsule (50,000 Units total) by mouth every 7 (seven) days.   No current facility-administered medications on file prior to visit.    Allergies:  Allergies  Allergen Reactions  . Acetaminophen Nausea Only  . Valproic  Acid Other (See Comments)    Tremors  . Depakote [Divalproex Sodium] Other (See Comments)    Tremors    Social History:  Social History   Socioeconomic History  . Marital status: Legally Separated    Spouse name: Not on file  . Number of children: Not on file  . Years of education: Not on file  . Highest education level: Not on file  Occupational History  . Occupation: unemployed  Tobacco Use  . Smoking status: Former    Types: Cigarettes    Quit date: 04/03/2017    Years since quitting: 5.1  . Smokeless tobacco: Never  . Tobacco comments:    only smoked for about 57months   Vaping Use  . Vaping Use: Never used  Substance and Sexual Activity  . Alcohol use: Yes    Alcohol/week: 3.0 standard drinks of alcohol    Types: 3 Standard drinks or equivalent per week  . Drug use: Not Currently    Types: Marijuana  . Sexual activity: Yes    Birth control/protection: None  Other Topics Concern  . Not on file  Social History Narrative  . Not on file   Social Determinants of Health   Financial Resource Strain: Not on file  Food Insecurity: Not on file  Transportation Needs: Not on file  Physical Activity: Not on file  Stress: Not on file  Social Connections: Not on file  Intimate Partner Violence: Not on file   Social History   Tobacco Use  Smoking Status Former  . Types: Cigarettes  . Quit date: 04/03/2017  . Years since quitting: 5.1  Smokeless Tobacco Never  Tobacco Comments   only smoked for about 6months    Social History   Substance and Sexual Activity  Alcohol Use Yes  . Alcohol/week: 3.0 standard drinks of alcohol  . Types: 3 Standard drinks or equivalent per week    Family History:  Family History  Problem Relation Age of Onset  . Diabetes Mother   . Cancer Mother 8       brain, skin  . Cancer Father 43       kidney, bone, rectal  . Hyperlipidemia Father   . Hypertension Father   . Stroke Father   . Allergies Sister   . Lupus Sister   .  Breast cancer Maternal Aunt 58  . Cancer Paternal Aunt        breast  . Diabetes Brother   . Congenital heart disease Brother     Past medical history, surgical history, medications, allergies, family history and social history reviewed with patient today and changes made to appropriate areas of the chart.   Review of Systems  Constitutional: Negative.  HENT: Negative.    Eyes: Negative.        Floaters  Respiratory: Negative.    Cardiovascular: Negative.   Gastrointestinal:  Positive for nausea. Negative for abdominal pain, blood in stool, constipation, diarrhea, heartburn, melena and vomiting.  Genitourinary: Negative.   Musculoskeletal: Negative.   Skin: Negative.   Neurological:  Positive for headaches. Negative for dizziness, tingling, tremors, sensory change, speech change, focal weakness, seizures, loss of consciousness and weakness.  Endo/Heme/Allergies:  Negative for environmental allergies and polydipsia. Bruises/bleeds easily.  Psychiatric/Behavioral: Negative.     All other ROS negative except what is listed above and in the HPI.      Objective:    BP 103/68   Pulse 63   Temp 98.1 F (36.7 C)   Ht 5\' 2"  (1.575 m)   Wt 134 lb 9.6 oz (61.1 kg)   LMP 04/19/2022 (Approximate)   SpO2 100%   BMI 24.62 kg/m   Wt Readings from Last 3 Encounters:  05/13/22 134 lb 9.6 oz (61.1 kg)  04/23/22 132 lb (59.9 kg)  01/30/22 135 lb (61.2 kg)    Physical Exam  Results for orders placed or performed during the hospital encounter of 01/14/22  Pregnancy, urine POC  Result Value Ref Range   Preg Test, Ur NEGATIVE NEGATIVE      Assessment & Plan:   Problem List Items Addressed This Visit   None Visit Diagnoses     Routine general medical examination at a health care facility    -  Primary   Relevant Orders   CBC with Differential/Platelet   Comprehensive metabolic panel   Lipid Panel w/o Chol/HDL Ratio   Cytology - PAP   Urinalysis, Routine w reflex microscopic    TSH   Microalbumin, Urine Waived        Follow up plan: No follow-ups on file.   LABORATORY TESTING:  - Pap smear: {Blank single:19197::"pap done","not applicable","up to date","done elsewhere"}  IMMUNIZATIONS:   - Tdap: Tetanus vaccination status reviewed: {tetanus status:315746}. - Influenza: {Blank single:19197::"Up to date","Administered today","Postponed to flu season","Refused","Given elsewhere"} - Pneumovax: {Blank single:19197::"Up to date","Administered today","Not applicable","Refused","Given elsewhere"} - Prevnar: {Blank single:19197::"Up to date","Administered today","Not applicable","Refused","Given elsewhere"} - COVID: {Blank single:19197::"Up to date","Administered today","Not applicable","Refused","Given elsewhere"} - HPV: {Blank single:19197::"Up to date","Administered today","Not applicable","Refused","Given elsewhere"} - Shingrix vaccine: {Blank single:19197::"Up to date","Administered today","Not applicable","Refused","Given elsewhere"}  SCREENING: -Mammogram: {Blank single:19197::"Up to date","Ordered today","Not applicable","Refused","Done elsewhere"}  - Colonoscopy: {Blank single:19197::"Up to date","Ordered today","Not applicable","Refused","Done elsewhere"}  - Bone Density: {Blank single:19197::"Up to date","Ordered today","Not applicable","Refused","Done elsewhere"}  -Hearing Test: {Blank single:19197::"Up to date","Ordered today","Not applicable","Refused","Done elsewhere"}  -Spirometry: {Blank single:19197::"Up to date","Ordered today","Not applicable","Refused","Done elsewhere"}   PATIENT COUNSELING:   Advised to take 1 mg of folate supplement per day if capable of pregnancy.   Sexuality: Discussed sexually transmitted diseases, partner selection, use of condoms, avoidance of unintended pregnancy  and contraceptive alternatives.   Advised to avoid cigarette smoking.  I discussed with the patient that most people either abstain from alcohol or drink  within safe limits (<=14/week and <=4 drinks/occasion for males, <=7/weeks and <= 3 drinks/occasion for females) and that the risk for alcohol disorders and other health effects rises proportionally with the number of drinks per week and how often a drinker exceeds daily limits.  Discussed cessation/primary prevention of drug use and availability of treatment for abuse.   Diet: Encouraged to adjust caloric intake to maintain  or achieve ideal body weight, to reduce intake of dietary saturated fat and total fat, to  limit sodium intake by avoiding high sodium foods and not adding table salt, and to maintain adequate dietary potassium and calcium preferably from fresh fruits, vegetables, and low-fat dairy products.    stressed the importance of regular exercise  Injury prevention: Discussed safety belts, safety helmets, smoke detector, smoking near bedding or upholstery.   Dental health: Discussed importance of regular tooth brushing, flossing, and dental visits.    NEXT PREVENTATIVE PHYSICAL DUE IN 1 YEAR. No follow-ups on file.

## 2022-05-13 NOTE — Patient Instructions (Signed)
Please call to schedule your mammogram and/or bone density: [facility]  Address: [address], [place], [place] [address] Phone: [phone]  [facility] [place] [address] [place],  [place]  [address] Phone: [phone]   

## 2022-05-14 LAB — COMPREHENSIVE METABOLIC PANEL
ALT: 18 IU/L (ref 0–32)
AST: 18 IU/L (ref 0–40)
Albumin/Globulin Ratio: 2.4 — ABNORMAL HIGH (ref 1.2–2.2)
Albumin: 4.6 g/dL (ref 3.9–4.9)
Alkaline Phosphatase: 50 IU/L (ref 44–121)
BUN/Creatinine Ratio: 12 (ref 9–23)
BUN: 10 mg/dL (ref 6–24)
Bilirubin Total: 0.3 mg/dL (ref 0.0–1.2)
CO2: 24 mmol/L (ref 20–29)
Calcium: 9.7 mg/dL (ref 8.7–10.2)
Chloride: 102 mmol/L (ref 96–106)
Creatinine, Ser: 0.83 mg/dL (ref 0.57–1.00)
Globulin, Total: 1.9 g/dL (ref 1.5–4.5)
Glucose: 90 mg/dL (ref 70–99)
Potassium: 4.3 mmol/L (ref 3.5–5.2)
Sodium: 138 mmol/L (ref 134–144)
Total Protein: 6.5 g/dL (ref 6.0–8.5)
eGFR: 88 mL/min/{1.73_m2} (ref >59)

## 2022-05-14 LAB — CBC WITH DIFFERENTIAL/PLATELET
Basophils Absolute: 0.1 10*3/uL (ref 0.0–0.2)
Basos: 1 %
EOS (ABSOLUTE): 0.2 10*3/uL (ref 0.0–0.4)
Eos: 2 %
Hematocrit: 38.4 % (ref 34.0–46.6)
Hemoglobin: 12.7 g/dL (ref 11.1–15.9)
Immature Grans (Abs): 0 10*3/uL (ref 0.0–0.1)
Immature Granulocytes: 0 %
Lymphocytes Absolute: 3.1 10*3/uL (ref 0.7–3.1)
Lymphs: 33 %
MCH: 32 pg (ref 26.6–33.0)
MCHC: 33.1 g/dL (ref 31.5–35.7)
MCV: 97 fL (ref 79–97)
Monocytes Absolute: 0.6 10*3/uL (ref 0.1–0.9)
Monocytes: 6 %
Neutrophils Absolute: 5.3 10*3/uL (ref 1.4–7.0)
Neutrophils: 58 %
Platelets: 221 10*3/uL (ref 150–450)
RBC: 3.97 x10E6/uL (ref 3.77–5.28)
RDW: 11.9 % (ref 11.7–15.4)
WBC: 9.2 10*3/uL (ref 3.4–10.8)

## 2022-05-14 LAB — URINALYSIS, ROUTINE W REFLEX MICROSCOPIC
Bilirubin, UA: NEGATIVE
Glucose, UA: NEGATIVE
Ketones, UA: NEGATIVE
Leukocytes,UA: NEGATIVE
Nitrite, UA: NEGATIVE
Protein,UA: NEGATIVE
RBC, UA: NEGATIVE
Specific Gravity, UA: 1.015 (ref 1.005–1.030)
Urobilinogen, Ur: 0.2 mg/dL (ref 0.2–1.0)
pH, UA: 6 (ref 5.0–7.5)

## 2022-05-14 LAB — TSH: TSH: 1.74 u[IU]/mL (ref 0.450–4.500)

## 2022-05-14 LAB — LIPID PANEL W/O CHOL/HDL RATIO
Cholesterol, Total: 156 mg/dL (ref 100–199)
HDL: 89 mg/dL (ref >39)
LDL Chol Calc (NIH): 55 mg/dL (ref 0–99)
Triglycerides: 59 mg/dL (ref 0–149)
VLDL Cholesterol Cal: 12 mg/dL (ref 5–40)

## 2022-05-14 LAB — MICROALBUMIN, URINE WAIVED
Creatinine, Urine Waived: 50 mg/dL (ref 10–300)
Microalb, Ur Waived: 10 mg/L (ref 0–19)
Microalb/Creat Ratio: <30 mg/g (ref <30)

## 2022-05-14 MED ORDER — NORTRIPTYLINE HCL 25 MG PO CAPS: 25.0000 mg | ORAL_CAPSULE | Freq: Every day | ORAL | 1 refills | Status: DC

## 2022-05-14 MED ORDER — ONDANSETRON HCL 4 MG PO TABS: 4.0000 mg | ORAL_TABLET | Freq: Three times a day (TID) | ORAL | 5 refills | Status: DC | PRN

## 2022-05-19 LAB — CYTOLOGY - PAP
Adequacy: ABSENT
Comment: NEGATIVE
Diagnosis: NEGATIVE
High risk HPV: NEGATIVE

## 2022-07-18 ENCOUNTER — Ambulatory Visit: Payer: Self-pay

## 2022-07-18 ENCOUNTER — Ambulatory Visit: Payer: No Typology Code available for payment source | Admitting: Nurse Practitioner

## 2022-07-18 ENCOUNTER — Encounter: Payer: Self-pay | Admitting: Nurse Practitioner

## 2022-07-18 VITALS — BP 104/70 | HR 62 | Temp 98.6°F | Wt 135.6 lb

## 2022-07-18 DIAGNOSIS — G43801 Other migraine, not intractable, with status migrainosus: Secondary | ICD-10-CM

## 2022-07-18 MED ORDER — SUMATRIPTAN SUCCINATE 100 MG PO TABS: 100.0000 mg | ORAL_TABLET | Freq: Once | ORAL | 0 refills | Status: DC

## 2022-07-18 MED ORDER — KETOROLAC TROMETHAMINE 60 MG/2ML IM SOLN
60.0000 mg | Freq: Once | INTRAMUSCULAR | Status: AC
  Administered 2022-07-18: 60 mg via INTRAMUSCULAR

## 2022-07-18 NOTE — Progress Notes (Signed)
BP 104/70   Pulse 62   Temp 98.6 F (37 C) (Oral)   Wt 135 lb 9.6 oz (61.5 kg)   LMP  (LMP Unknown)   SpO2 98%   BMI 24.80 kg/m    Subjective:    Patient ID: IOMA CHISMAR, female    DOB: July 12, 1976, 46 y.o.   MRN: [id number]  HPI: SHALEEN TALAMANTEZ is a 46 y.o. female  Chief Complaint  Patient presents with   Migraine    Onset since Wednesday. Pt reports on Wednesday she was having visual disturbance. Pt reports having nausea, advil and zofran with "taking the edge off".    Patient states she has been having a migraine since Wednesday.  Symptoms worsened yesterday with bad headache and nausea.  She vomited her dinner last night.  Not able to eat.     Relevant past medical, surgical, family and social history reviewed and updated as indicated. Interim medical history since our last visit reviewed. Allergies and medications reviewed and updated.  Review of Systems  Gastrointestinal:  Positive for nausea and vomiting.  Neurological:  Positive for headaches.    Per HPI unless specifically indicated above     Objective:    BP 104/70   Pulse 62   Temp 98.6 F (37 C) (Oral)   Wt 135 lb 9.6 oz (61.5 kg)   LMP  (LMP Unknown)   SpO2 98%   BMI 24.80 kg/m   Wt Readings from Last 3 Encounters:  07/18/22 135 lb 9.6 oz (61.5 kg)  05/13/22 134 lb 9.6 oz (61.1 kg)  04/23/22 132 lb (59.9 kg)    Physical Exam Vitals and nursing note reviewed.  Constitutional:      General: She is not in acute distress.    Appearance: Normal appearance. She is normal weight. She is not ill-appearing, toxic-appearing or diaphoretic.  HENT:     Head: Normocephalic.     Right Ear: External ear normal.     Left Ear: External ear normal.     Nose: Nose normal.     Mouth/Throat:     Mouth: Mucous membranes are moist.     Pharynx: Oropharynx is clear.  Eyes:     General:        Right eye: No discharge.        Left eye: No discharge.     Extraocular Movements: Extraocular movements  intact.     Conjunctiva/sclera: Conjunctivae normal.     Pupils: Pupils are equal, round, and reactive to light.  Cardiovascular:     Rate and Rhythm: Normal rate and regular rhythm.     Heart sounds: No murmur heard. Pulmonary:     Effort: Pulmonary effort is normal. No respiratory distress.     Breath sounds: Normal breath sounds. No wheezing or rales.  Musculoskeletal:     Cervical back: Normal range of motion and neck supple.  Skin:    General: Skin is warm and dry.     Capillary Refill: Capillary refill takes less than 2 seconds.  Neurological:     General: No focal deficit present.     Mental Status: She is alert and oriented to person, place, and time. Mental status is at baseline.  Psychiatric:        Mood and Affect: Mood normal.        Behavior: Behavior normal.        Thought Content: Thought content normal.        Judgment: Judgment normal.  Results for orders placed or performed in visit on 05/13/22  CBC with Differential/Platelet  Result Value Ref Range   WBC 9.2 3.4 - 10.8 x10E3/uL   RBC 3.97 3.77 - 5.28 x10E6/uL   Hemoglobin 12.7 11.1 - 15.9 g/dL   Hematocrit 38.4 34.0 - 46.6 %   MCV 97 79 - 97 fL   MCH 32.0 26.6 - 33.0 pg   MCHC 33.1 31.5 - 35.7 g/dL   RDW 11.9 11.7 - 15.4 %   Platelets 221 150 - 450 x10E3/uL   Neutrophils 58 Not Estab. %   Lymphs 33 Not Estab. %   Monocytes 6 Not Estab. %   Eos 2 Not Estab. %   Basos 1 Not Estab. %   Neutrophils Absolute 5.3 1.4 - 7.0 x10E3/uL   Lymphocytes Absolute 3.1 0.7 - 3.1 x10E3/uL   Monocytes Absolute 0.6 0.1 - 0.9 x10E3/uL   EOS (ABSOLUTE) 0.2 0.0 - 0.4 x10E3/uL   Basophils Absolute 0.1 0.0 - 0.2 x10E3/uL   Immature Granulocytes 0 Not Estab. %   Immature Grans (Abs) 0.0 0.0 - 0.1 x10E3/uL  Comprehensive metabolic panel  Result Value Ref Range   Glucose 90 70 - 99 mg/dL   BUN 10 6 - 24 mg/dL   Creatinine, Ser 0.83 0.57 - 1.00 mg/dL   eGFR 88 >59 mL/min/1.73   BUN/Creatinine Ratio 12 9 - 23   Sodium  138 134 - 144 mmol/L   Potassium 4.3 3.5 - 5.2 mmol/L   Chloride 102 96 - 106 mmol/L   CO2 24 20 - 29 mmol/L   Calcium 9.7 8.7 - 10.2 mg/dL   Total Protein 6.5 6.0 - 8.5 g/dL   Albumin 4.6 3.9 - 4.9 g/dL   Globulin, Total 1.9 1.5 - 4.5 g/dL   Albumin/Globulin Ratio 2.4 (H) 1.2 - 2.2   Bilirubin Total 0.3 0.0 - 1.2 mg/dL   Alkaline Phosphatase 50 44 - 121 IU/L   AST 18 0 - 40 IU/L   ALT 18 0 - 32 IU/L  Lipid Panel w/o Chol/HDL Ratio  Result Value Ref Range   Cholesterol, Total 156 100 - 199 mg/dL   Triglycerides 59 0 - 149 mg/dL   HDL 89 >39 mg/dL   VLDL Cholesterol Cal 12 5 - 40 mg/dL   LDL Chol Calc (NIH) 55 0 - 99 mg/dL  Urinalysis, Routine w reflex microscopic  Result Value Ref Range   Specific Gravity, UA 1.015 1.005 - 1.030   pH, UA 6.0 5.0 - 7.5   Color, UA Yellow Yellow   Appearance Ur Clear Clear   Leukocytes,UA Negative Negative   Protein,UA Negative Negative/Trace   Glucose, UA Negative Negative   Ketones, UA Negative Negative   RBC, UA Negative Negative   Bilirubin, UA Negative Negative   Urobilinogen, Ur 0.2 0.2 - 1.0 mg/dL   Nitrite, UA Negative Negative  TSH  Result Value Ref Range   TSH 1.740 0.450 - 4.500 uIU/mL  Microalbumin, Urine Waived  Result Value Ref Range   Microalb, Ur Waived 10 0 - 19 mg/L   Creatinine, Urine Waived 50 10 - 300 mg/dL   Microalb/Creat Ratio <30 <30 mg/g  Cytology - PAP  Result Value Ref Range   High risk HPV Negative    Adequacy      Satisfactory for evaluation; transformation zone component ABSENT.   Diagnosis      - Negative for intraepithelial lesion or malignancy (NILM)   Comment Normal Reference Range HPV - Negative  Assessment & Plan:   Problem List Items Addressed This Visit   None Visit Diagnoses     Other migraine with status migrainosus, not intractable    -  Primary   Ongoing since Wednesday. Will give toradol in office. Will send prescription for Imitrex to take at home. Advised of side effects and  benefits of medications.    Relevant Medications   SUMAtriptan (IMITREX) 100 MG tablet   ketorolac (TORADOL) injection 60 mg (Start on 07/18/2022  1:30 PM)        Follow up plan: No follow-ups on file.

## 2022-07-18 NOTE — Telephone Encounter (Signed)
  Chief Complaint: Migraine HA Symptoms: Aura nausea, vomiting Frequency: since weds Pertinent Negatives: Patient denies fever Disposition: [] ED /[x] Urgent Care (no appt availability in office) / [] Appointment(In office/virtual)/ []  [facility]/ [] Home Care/ [] Refused Recommended Disposition /[] [facility]/ []  Follow-up with PCP Additional Notes: PT states that she has had migraines in the past and was given a "shot" to help. PT is seeing auras, nauseous, and has vomited.  PT would like to be seen today if possible.  Please return call.  Reason for Disposition  [1] SEVERE headache (e.g., excruciating) AND [2] not improved after 2 hours of pain medicine  Answer Assessment - Initial Assessment Questions 1. LOCATION: "Where does it hurt?"      head 2. ONSET: "When did the headache start?" (Minutes, hours or days)      Weds 3. PATTERN: "Does the pain come and go, or has it been constant since it started?"     Constantly - 2/10 then goes to 6/10 4. SEVERITY: "How bad is the pain?" and "What does it keep you from doing?"  (e.g., Scale 1-10; mild, moderate, or severe)   - MILD (1-3): doesn't interfere with normal activities    - MODERATE (4-7): interferes with normal activities or awakens from sleep    - SEVERE (8-10): excruciating pain, unable to do any normal activities        2/10 then goes to 6/10 5. RECURRENT SYMPTOM: "Have you ever had headaches before?" If Yes, ask: "When was the last time?" and "What happened that time?"      yes 6. CAUSE: "What do you think is causing the headache?"     Migraine 7. MIGRAINE: "Have you been diagnosed with migraine headaches?" If Yes, ask: "Is this headache similar?"      yes 8. HEAD INJURY: "Has there been any recent injury to the head?"       9. OTHER SYMPTOMS: "Do you have any other symptoms?" (fever, stiff neck, eye pain, sore throat, cold symptoms)     Aura, nausea, vomiting 10. PREGNANCY: "Is there any chance you are  pregnant?" "When was your last menstrual period?"  Protocols used: [facility]

## 2022-07-18 NOTE — Telephone Encounter (Signed)
Pt scheduled  

## 2022-07-21 ENCOUNTER — Encounter: Payer: Self-pay | Admitting: Family Medicine

## 2022-07-21 ENCOUNTER — Ambulatory Visit: Payer: No Typology Code available for payment source | Admitting: Family Medicine

## 2022-07-21 VITALS — BP 109/72 | HR 58 | Temp 97.9°F | Wt 136.4 lb

## 2022-07-21 DIAGNOSIS — R252 Cramp and spasm: Secondary | ICD-10-CM

## 2022-07-21 DIAGNOSIS — E559 Vitamin D deficiency, unspecified: Secondary | ICD-10-CM | POA: Diagnosis not present

## 2022-07-21 DIAGNOSIS — N912 Amenorrhea, unspecified: Secondary | ICD-10-CM

## 2022-07-21 DIAGNOSIS — D509 Iron deficiency anemia, unspecified: Secondary | ICD-10-CM

## 2022-07-21 LAB — PREGNANCY, URINE: Preg Test, Ur: NEGATIVE

## 2022-07-21 MED ORDER — ROPINIROLE HCL 0.25 MG PO TABS: ORAL_TABLET | ORAL | 1 refills | Status: DC

## 2022-07-21 NOTE — Assessment & Plan Note (Signed)
Labs drawn today. Await results.  

## 2022-07-21 NOTE — Progress Notes (Signed)
BP 109/72   Pulse (!) 58   Temp 97.9 F (36.6 C)   Wt 136 lb 6.4 oz (61.9 kg)   LMP  (LMP Unknown)   SpO2 99%   BMI 24.95 kg/m    Subjective:    Patient ID: Belinda [name], female    DOB: 06/30/1976, 46 y.o.   MRN: [id number]  HPI: Belinda [name] is a 46 y.o. female  Chief Complaint  Patient presents with   leg cramps    Patient states she has been having leg cramps in both legs, ,mainly in her calves and thighs.    LEG CRAMPS Duration: chronic Pain: yes Severity: severe  Quality:  spasm Location:   both legs Bilateral:  yes Onset: sudden Frequency: at night Time of  day:   night time Sudden unintentional leg jerking:   no Paresthesias:   no Decreased sensation:  no Weakness:   no Insomnia:   yes Fatigue:   yes Status: worse Treatments attempted: potassium, magnesium, water, stretching  3 days late on her period. Would like a pregnancy test  Relevant past medical, surgical, family and social history reviewed and updated as indicated. Interim medical history since our last visit reviewed. Allergies and medications reviewed and updated.  Review of Systems  Constitutional: Negative.   Respiratory: Negative.    Cardiovascular: Negative.   Gastrointestinal: Negative.   Musculoskeletal:  Positive for myalgias. Negative for arthralgias, back pain, gait problem, joint swelling, neck pain and neck stiffness.  Neurological: Negative.   Psychiatric/Behavioral: Negative.      Per HPI unless specifically indicated above     Objective:    BP 109/72   Pulse (!) 58   Temp 97.9 F (36.6 C)   Wt 136 lb 6.4 oz (61.9 kg)   LMP  (LMP Unknown)   SpO2 99%   BMI 24.95 kg/m   Wt Readings from Last 3 Encounters:  07/21/22 136 lb 6.4 oz (61.9 kg)  07/18/22 135 lb 9.6 oz (61.5 kg)  05/13/22 134 lb 9.6 oz (61.1 kg)    Physical Exam Vitals and nursing note reviewed.  Constitutional:      General: She is not in acute distress.    Appearance: Normal appearance.  She is normal weight. She is not ill-appearing, toxic-appearing or diaphoretic.  HENT:     Head: Normocephalic and atraumatic.     Right Ear: External ear normal.     Left Ear: External ear normal.     Nose: Nose normal.     Mouth/Throat:     Mouth: Mucous membranes are moist.     Pharynx: Oropharynx is clear.  Eyes:     General: No scleral icterus.       Right eye: No discharge.        Left eye: No discharge.     Extraocular Movements: Extraocular movements intact.     Conjunctiva/sclera: Conjunctivae normal.     Pupils: Pupils are equal, round, and reactive to light.  Cardiovascular:     Rate and Rhythm: Normal rate and regular rhythm.     Pulses: Normal pulses.     Heart sounds: Normal heart sounds. No murmur heard.    No friction rub. No gallop.  Pulmonary:     Effort: Pulmonary effort is normal. No respiratory distress.     Breath sounds: Normal breath sounds. No stridor. No wheezing, rhonchi or rales.  Chest:     Chest wall: No tenderness.  Musculoskeletal:  General: Normal range of motion.     Cervical back: Normal range of motion and neck supple.  Skin:    General: Skin is warm and dry.     Capillary Refill: Capillary refill takes less than 2 seconds.     Coloration: Skin is not jaundiced or pale.     Findings: No bruising, erythema, lesion or rash.  Neurological:     General: No focal deficit present.     Mental Status: She is alert and oriented to person, place, and time. Mental status is at baseline.  Psychiatric:        Mood and Affect: Mood normal.        Behavior: Behavior normal.        Thought Content: Thought content normal.        Judgment: Judgment normal.     Results for orders placed or performed in visit on 05/13/22  CBC with Differential/Platelet  Result Value Ref Range   WBC 9.2 3.4 - 10.8 x10E3/uL   RBC 3.97 3.77 - 5.28 x10E6/uL   Hemoglobin 12.7 11.1 - 15.9 g/dL   Hematocrit 38.4 34.0 - 46.6 %   MCV 97 79 - 97 fL   MCH 32.0 26.6 -  33.0 pg   MCHC 33.1 31.5 - 35.7 g/dL   RDW 11.9 11.7 - 15.4 %   Platelets 221 150 - 450 x10E3/uL   Neutrophils 58 Not Estab. %   Lymphs 33 Not Estab. %   Monocytes 6 Not Estab. %   Eos 2 Not Estab. %   Basos 1 Not Estab. %   Neutrophils Absolute 5.3 1.4 - 7.0 x10E3/uL   Lymphocytes Absolute 3.1 0.7 - 3.1 x10E3/uL   Monocytes Absolute 0.6 0.1 - 0.9 x10E3/uL   EOS (ABSOLUTE) 0.2 0.0 - 0.4 x10E3/uL   Basophils Absolute 0.1 0.0 - 0.2 x10E3/uL   Immature Granulocytes 0 Not Estab. %   Immature Grans (Abs) 0.0 0.0 - 0.1 x10E3/uL  Comprehensive metabolic panel  Result Value Ref Range   Glucose 90 70 - 99 mg/dL   BUN 10 6 - 24 mg/dL   Creatinine, Ser 0.83 0.57 - 1.00 mg/dL   eGFR 88 >59 mL/min/1.73   BUN/Creatinine Ratio 12 9 - 23   Sodium 138 134 - 144 mmol/L   Potassium 4.3 3.5 - 5.2 mmol/L   Chloride 102 96 - 106 mmol/L   CO2 24 20 - 29 mmol/L   Calcium 9.7 8.7 - 10.2 mg/dL   Total Protein 6.5 6.0 - 8.5 g/dL   Albumin 4.6 3.9 - 4.9 g/dL   Globulin, Total 1.9 1.5 - 4.5 g/dL   Albumin/Globulin Ratio 2.4 (H) 1.2 - 2.2   Bilirubin Total 0.3 0.0 - 1.2 mg/dL   Alkaline Phosphatase 50 44 - 121 IU/L   AST 18 0 - 40 IU/L   ALT 18 0 - 32 IU/L  Lipid Panel w/o Chol/HDL Ratio  Result Value Ref Range   Cholesterol, Total 156 100 - 199 mg/dL   Triglycerides 59 0 - 149 mg/dL   HDL 89 >39 mg/dL   VLDL Cholesterol Cal 12 5 - 40 mg/dL   LDL Chol Calc (NIH) 55 0 - 99 mg/dL  Urinalysis, Routine w reflex microscopic  Result Value Ref Range   Specific Gravity, UA 1.015 1.005 - 1.030   pH, UA 6.0 5.0 - 7.5   Color, UA Yellow Yellow   Appearance Ur Clear Clear   Leukocytes,UA Negative Negative   Protein,UA Negative Negative/Trace  Glucose, UA Negative Negative   Ketones, UA Negative Negative   RBC, UA Negative Negative   Bilirubin, UA Negative Negative   Urobilinogen, Ur 0.2 0.2 - 1.0 mg/dL   Nitrite, UA Negative Negative  TSH  Result Value Ref Range   TSH 1.740 0.450 - 4.500 uIU/mL   Microalbumin, Urine Waived  Result Value Ref Range   Microalb, Ur Waived 10 0 - 19 mg/L   Creatinine, Urine Waived 50 10 - 300 mg/dL   Microalb/Creat Ratio <30 <30 mg/g  Cytology - PAP  Result Value Ref Range   High risk HPV Negative    Adequacy      Satisfactory for evaluation; transformation zone component ABSENT.   Diagnosis      - Negative for intraepithelial lesion or malignancy (NILM)   Comment Normal Reference Range HPV - Negative       Assessment & Plan:   Problem List Items Addressed This Visit       Other   IDA (iron deficiency anemia)    Labs drawn today. Await results.       Relevant Orders   CBC with Differential/Platelet   Iron Binding Cap (TIBC)([facility])   Ferritin   Vitamin D deficiency    Labs drawn today. Await results.       Other Visit Diagnoses     Leg cramps    -  Primary   Will check labs to look for cause. Start requip. Recheck about 4-6 weeks. Call with any concerns.    Relevant Orders   Comp Met (CMET)   Magnesium   Phosphorus   VITAMIN D 25 Hydroxy (Vit-D Deficiency, Fractures)   TSH   B12   Amenorrhea       Labs drawn today. Await results.    Relevant Orders   Pregnancy, urine   hCG, serum, qualitative        Follow up plan: Return 4-6 weeks please.

## 2022-07-22 ENCOUNTER — Telehealth: Payer: Self-pay | Admitting: Family Medicine

## 2022-07-22 LAB — CBC WITH DIFFERENTIAL/PLATELET
Basophils Absolute: 0.1 10*3/uL (ref 0.0–0.2)
Basos: 1 %
EOS (ABSOLUTE): 0.1 10*3/uL (ref 0.0–0.4)
Eos: 2 %
Hematocrit: 37.4 % (ref 34.0–46.6)
Hemoglobin: 12.5 g/dL (ref 11.1–15.9)
Immature Grans (Abs): 0 10*3/uL (ref 0.0–0.1)
Immature Granulocytes: 0 %
Lymphocytes Absolute: 2.5 10*3/uL (ref 0.7–3.1)
Lymphs: 37 %
MCH: 31.9 pg (ref 26.6–33.0)
MCHC: 33.4 g/dL (ref 31.5–35.7)
MCV: 95 fL (ref 79–97)
Monocytes Absolute: 0.4 10*3/uL (ref 0.1–0.9)
Monocytes: 6 %
Neutrophils Absolute: 3.8 10*3/uL (ref 1.4–7.0)
Neutrophils: 54 %
Platelets: 224 10*3/uL (ref 150–450)
RBC: 3.92 x10E6/uL (ref 3.77–5.28)
RDW: 12.5 % (ref 11.7–15.4)
WBC: 6.9 10*3/uL (ref 3.4–10.8)

## 2022-07-22 LAB — PHOSPHORUS: Phosphorus: 3.9 mg/dL (ref 3.0–4.3)

## 2022-07-22 LAB — COMPREHENSIVE METABOLIC PANEL
ALT: 17 IU/L (ref 0–32)
AST: 19 IU/L (ref 0–40)
Albumin/Globulin Ratio: 2.1 (ref 1.2–2.2)
Albumin: 4.4 g/dL (ref 3.9–4.9)
Alkaline Phosphatase: 46 IU/L (ref 44–121)
BUN/Creatinine Ratio: 10 (ref 9–23)
BUN: 8 mg/dL (ref 6–24)
Bilirubin Total: 0.4 mg/dL (ref 0.0–1.2)
CO2: 24 mmol/L (ref 20–29)
Calcium: 9.3 mg/dL (ref 8.7–10.2)
Chloride: 105 mmol/L (ref 96–106)
Creatinine, Ser: 0.78 mg/dL (ref 0.57–1.00)
Globulin, Total: 2.1 g/dL (ref 1.5–4.5)
Glucose: 87 mg/dL (ref 70–99)
Potassium: 4.4 mmol/L (ref 3.5–5.2)
Sodium: 141 mmol/L (ref 134–144)
Total Protein: 6.5 g/dL (ref 6.0–8.5)
eGFR: 95 mL/min/{1.73_m2} (ref >59)

## 2022-07-22 LAB — IRON AND TIBC
Iron Saturation: 38 % (ref 15–55)
Iron: 130 ug/dL (ref 27–159)
Total Iron Binding Capacity: 339 ug/dL (ref 250–450)
UIBC: 209 ug/dL (ref 131–425)

## 2022-07-22 LAB — VITAMIN B12: Vitamin B-12: 425 pg/mL (ref 232–1245)

## 2022-07-22 LAB — FERRITIN: Ferritin: 121 ng/mL (ref 15–150)

## 2022-07-22 LAB — TSH: TSH: 1.52 u[IU]/mL (ref 0.450–4.500)

## 2022-07-22 LAB — VITAMIN D 25 HYDROXY (VIT D DEFICIENCY, FRACTURES): Vit D, 25-Hydroxy: 35.9 ng/mL (ref 30.0–100.0)

## 2022-07-22 LAB — HCG, SERUM, QUALITATIVE: hCG,Beta Subunit,Qual,Serum: NEGATIVE m[IU]/mL (ref <6)

## 2022-07-22 LAB — MAGNESIUM: Magnesium: 2.3 mg/dL (ref 1.6–2.3)

## 2022-07-22 NOTE — Telephone Encounter (Signed)
Copied from [place] [id number]. Topic: General - Other >> Jul 22, 2022 11:16 AM Leitha Schuller wrote: Pt checking status of 10-16 lab results, pt inquiring if pregnancy test is included in labs  Please fu w/ pt once resulted

## 2022-07-22 NOTE — Telephone Encounter (Signed)
Patient has viewed lab results on MyChart, no further questions.

## 2022-08-12 ENCOUNTER — Other Ambulatory Visit: Payer: Self-pay | Admitting: Family Medicine

## 2022-08-12 NOTE — Telephone Encounter (Signed)
Requested Prescriptions  Pending Prescriptions Disp Refills   rOPINIRole (REQUIP) 0.25 MG tablet [Pharmacy Med Name: ROPINIROLE HCL 0.25 MG TABLET] 180 tablet 0    Sig: TAKE 1 PILL AT BEDTIME FOR 1 WEEK, THEN INCREASE TO 2 PILLS AT BEDTIME IF NEEDED     Neurology:  Parkinsonian Agents Passed - 08/12/2022 12:32 PM      Passed - Last BP in normal range    BP Readings from Last 1 Encounters:  07/21/22 109/72         Passed - Last Heart Rate in normal range    Pulse Readings from Last 1 Encounters:  07/21/22 (!) 58         Passed - Valid encounter within last 12 months    Recent Outpatient Visits           3 weeks ago Leg cramps   [place], Megan P, DO   3 weeks ago Other migraine with status migrainosus, not intractable   [facility] Jon Billings, NP   3 months ago Routine general medical examination at a health care facility   [facility] [place] P, DO   6 months ago Acute nonintractable headache, unspecified headache type   [facility], Megan P, DO   7 months ago Acute nonintractable headache, unspecified headache type   [facility] Valerie Roys, DO       Future Appointments             In 1 week Earlie Server, MD [place] [facility]   In 1 week Wynetta Emery, Barb Merino, [place], [place]

## 2022-08-19 ENCOUNTER — Inpatient Hospital Stay: Payer: No Typology Code available for payment source | Attending: Oncology | Admitting: Oncology

## 2022-08-19 ENCOUNTER — Encounter: Payer: Self-pay | Admitting: Oncology

## 2022-08-19 DIAGNOSIS — Z9884 Bariatric surgery status: Secondary | ICD-10-CM

## 2022-08-19 DIAGNOSIS — E119 Type 2 diabetes mellitus without complications: Secondary | ICD-10-CM | POA: Insufficient documentation

## 2022-08-19 DIAGNOSIS — Z79899 Other long term (current) drug therapy: Secondary | ICD-10-CM | POA: Insufficient documentation

## 2022-08-19 DIAGNOSIS — D509 Iron deficiency anemia, unspecified: Secondary | ICD-10-CM | POA: Diagnosis not present

## 2022-08-19 DIAGNOSIS — E538 Deficiency of other specified B group vitamins: Secondary | ICD-10-CM

## 2022-08-19 DIAGNOSIS — D508 Other iron deficiency anemias: Secondary | ICD-10-CM | POA: Insufficient documentation

## 2022-08-19 DIAGNOSIS — Z7982 Long term (current) use of aspirin: Secondary | ICD-10-CM | POA: Insufficient documentation

## 2022-08-19 DIAGNOSIS — Z87891 Personal history of nicotine dependence: Secondary | ICD-10-CM | POA: Insufficient documentation

## 2022-08-19 NOTE — Progress Notes (Signed)
Pt here for follow up. Pt reports reports that was put on a new medication for restless leg and that has caused her some nausea.

## 2022-08-19 NOTE — Progress Notes (Addendum)
HEMATOLOGY-ONCOLOGY TeleHEALTH VISIT PROGRESS NOTE  I connected with Belinda [name] on 08/19/22 at  3:00 PM EST by video enabled telemedicine visit and verified that I am speaking with the correct person using two identifiers. I discussed the limitations, risks, security and privacy concerns of performing an evaluation and management service by telemedicine and the availability of in-person appointments. I also discussed with the patient that there may be a patient responsible charge related to this service. The patient expressed understanding and agreed to proceed.   Other persons participating in the visit and their role in the encounter:  None  Patient's location:at work  Provider's location: office Chief Complaint: Follow-up for iron deficiency anemia, gastric bypass.   INTERVAL HISTORY Belinda [name] is a 46 y.o. female who has above history reviewed by me today presents for follow up visit for management of iron deficiency anemia and gastric bypass. Patient reports feeling well.  No new complaints She had a blood work done at [facility] ordered by primary care provider. Presents for follow-up.  Occasional nausea after taking Requip  Review of Systems  Constitutional:  Negative for appetite change, chills, fatigue, fever and unexpected weight change.  HENT:   Negative for hearing loss and voice change.   Eyes:  Negative for eye problems.  Respiratory:  Negative for chest tightness and cough.   Cardiovascular:  Negative for chest pain.  Gastrointestinal:  Negative for abdominal distention, abdominal pain, blood in stool and nausea.  Endocrine: Negative for hot flashes.  Genitourinary:  Negative for difficulty urinating and frequency.   Musculoskeletal:  Negative for arthralgias.  Skin:  Negative for itching and rash.  Neurological:  Negative for extremity weakness.  Hematological:  Negative for adenopathy.  Psychiatric/Behavioral:  Negative for confusion.       Past  Medical History:  Diagnosis Date   Anemia    Anxiety    Arthritis    lower back   Bipolar affective disorder ([place])    Controlled substance agreement signed 10/16/2017   Deviated septum    Diabetes mellitus without complication (HCC)    GERD (gastroesophageal reflux disease)    Headache(784.0)    migraines - none 2 months   Low iron    Peptic ulcer    Pre-diabetes    Schizophrenia ([place])    Sleep apnea    Substance abuse ([place])    Past Surgical History:  Procedure Laterality Date   COLONOSCOPY WITH PROPOFOL N/A 01/14/2022   Procedure: COLONOSCOPY WITH PROPOFOL;  Surgeon: Jonathon Bellows, MD;  Location[facility] ENDOSCOPY;  Service: Gastroenterology;  Laterality: N/A;   ESOPHAGOGASTRODUODENOSCOPY  08/05/2012   Procedure: ESOPHAGOGASTRODUODENOSCOPY (EGD);  Surgeon: Shann Medal, MD;  Location: [facility] ENDOSCOPY;  Service: General;  Laterality: N/A;   ESOPHAGOGASTRODUODENOSCOPY N/A 01/14/2022   Procedure: ESOPHAGOGASTRODUODENOSCOPY (EGD);  Surgeon: Jonathon Bellows, MD;  Location: [facility] ENDOSCOPY;  Service: Gastroenterology;  Laterality: N/A;   ESOPHAGOGASTRODUODENOSCOPY (EGD) WITH PROPOFOL N/A 08/06/2016   Procedure: ESOPHAGOGASTRODUODENOSCOPY (EGD) WITH PROPOFOL;  Surgeon: Jonathon Bellows, MD;  Location: [facility];  Service: Endoscopy;  Laterality: N/A;   ESOPHAGOGASTRODUODENOSCOPY (EGD) WITH PROPOFOL N/A 11/07/2016   Procedure: ESOPHAGOGASTRODUODENOSCOPY (EGD) WITH PROPOFOL;  Surgeon: Jonathon Bellows, MD;  Location: [facility] ENDOSCOPY;  Service: Endoscopy;  Laterality: N/A;   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/2011   Procedure: LAPAROSCOPIC ROUX-EN-Y GASTRIC;  Surgeon: Edward Jolly, MD;  Location: [facility] ORS;  Service: General;  Laterality: N/A;  upper endoscopy   SEPTOPLASTY  2003    Family History  Problem Relation Age  of Onset   Diabetes Mother    Cancer Mother 26       brain, skin   Cancer Father 23       kidney, bone, rectal   Hyperlipidemia Father    Hypertension Father    Stroke Father    Allergies  Sister    Lupus Sister    Breast cancer Maternal Aunt 70   Cancer Paternal Aunt        breast   Diabetes Brother    Congenital heart disease Brother     Social History   Socioeconomic History   Marital status: Legally Separated    Spouse name: Not on file   Number of children: Not on file   Years of education: Not on file   Highest education level: Not on file  Occupational History   Occupation: unemployed  Tobacco Use   Smoking status: Former    Types: Cigarettes    Quit date: 04/03/2017    Years since quitting: 5.3   Smokeless tobacco: Never   Tobacco comments:    only smoked for about 18months   Vaping Use   Vaping Use: Never used  Substance and Sexual Activity   Alcohol use: Yes    Alcohol/week: 3.0 standard drinks of alcohol    Types: 3 Standard drinks or equivalent per week   Drug use: Not Currently    Types: Marijuana   Sexual activity: Yes    Birth control/protection: None  Other Topics Concern   Not on file  Social History Narrative   Not on file   Social Determinants of Health   Financial Resource Strain: Not on file  Food Insecurity: Not on file  Transportation Needs: Not on file  Physical Activity: Not on file  Stress: Not on file  Social Connections: Not on file  Intimate Partner Violence: Not on file    Current Outpatient Medications on File Prior to Visit  Medication Sig Dispense Refill   aspirin EC 81 MG tablet Take 1 tablet (81 mg total) by mouth daily. Swallow whole. 30 tablet 12   B-D 3CC LUER-LOK SYR 25GX1" 25G X 1" 3 ML MISC      cyanocobalamin (,VITAMIN B-12,) 1000 MCG/ML injection Inject 1 mL (1,000 mcg total) into the muscle once a week. 12 mL 4   lidocaine (LIDODERM) 5 % Place 1 patch onto the skin daily. Remove & Discard patch within 12 hours or as directed by MD 30 patch 0   ondansetron (ZOFRAN) 4 MG tablet Take 1 tablet (4 mg total) by mouth every 8 (eight) hours as needed. for nausea 20 tablet 5   Probiotic Product ([employer] MOOD SUPPORT  PROBIOTIC PO) Take by mouth daily.     rOPINIRole (REQUIP) 0.25 MG tablet TAKE 1 PILL AT BEDTIME FOR 1 WEEK, THEN INCREASE TO 2 PILLS AT BEDTIME IF NEEDED 180 tablet 0   SUMAtriptan (IMITREX) 100 MG tablet Take 1 tablet (100 mg total) by mouth once for 1 dose. May repeat in 2 hours if headache persists or recurs. Not to exceed 2 in 24 hours. 10 tablet 0   Vitamin D, Ergocalciferol, (DRISDOL) 1.25 MG (50000 UNIT) CAPS capsule Take 1 capsule (50,000 Units total) by mouth every 7 (seven) days. 12 capsule 3   No current facility-administered medications on file prior to visit.    Allergies  Allergen Reactions   Acetaminophen Nausea Only   Valproic Acid Other (See Comments)    Tremors   Depakote [Divalproex Sodium] Other (See Comments)  Tremors       Observations/Objective: There were no vitals filed for this visit. There is no height or weight on file to calculate BMI.  Physical Exam Constitutional:      General: She is not in acute distress. Neurological:     Mental Status: She is alert.  Psychiatric:        Mood and Affect: Mood normal.     CBC    Component Value Date/Time   WBC 6.9 07/21/2022 1007   WBC 8.6 12/16/2021 1856   RBC 3.92 07/21/2022 1007   RBC 4.17 12/16/2021 1856   HGB 12.5 07/21/2022 1007   HCT 37.4 07/21/2022 1007   PLT 224 07/21/2022 1007   MCV 95 07/21/2022 1007   MCH 31.9 07/21/2022 1007   MCH 32.1 12/16/2021 1856   MCHC 33.4 07/21/2022 1007   MCHC 33.5 12/16/2021 1856   RDW 12.5 07/21/2022 1007   LYMPHSABS 2.5 07/21/2022 1007   MONOABS 0.7 12/16/2021 1856   EOSABS 0.1 07/21/2022 1007   BASOSABS 0.1 07/21/2022 1007    CMP     Component Value Date/Time   NA 141 07/21/2022 1007   K 4.4 07/21/2022 1007   CL 105 07/21/2022 1007   CO2 24 07/21/2022 1007   GLUCOSE 87 07/21/2022 1007   GLUCOSE 80 12/16/2021 1856   BUN 8 07/21/2022 1007   CREATININE 0.78 07/21/2022 1007   CREATININE 0.88 11/14/2011 1728   CALCIUM 9.3 07/21/2022 1007   PROT  6.5 07/21/2022 1007   ALBUMIN 4.4 07/21/2022 1007   AST 19 07/21/2022 1007   ALT 17 07/21/2022 1007   ALKPHOS 46 07/21/2022 1007   BILITOT 0.4 07/21/2022 1007   GFRNONAA >60 12/16/2021 1856   GFRAA 114 11/05/2020 0835     Assessment and Plan: 1. Iron deficiency anemia, unspecified iron deficiency anemia type   2. History of gastric bypass   3. B12 deficiency     History of iron deficiency in the context of history of gastric bypass. Recent labs reviewed and discussed with patient 07/21/22, ferritin 121, iron saturation 38.  No need for additional IV Venofer at this point Repeat labs in 6 months.  #Vitamin B12 deficiency, B12 level is normal. She self administrate weekly  B12 injections, managed by PCP  Follow Up Instructions: 6 months repeat the labs in a virtual visit [patient's preference].  [facility] slip will be mailed to patient.   I discussed the assessment and treatment plan with the patient. The patient was provided an opportunity to ask questions and all were answered. The patient agreed with the plan and demonstrated an understanding of the instructions.  The patient was advised to call back or seek an in-person evaluation if the symptoms worsen or if the condition fails to improve as anticipated.   Rickard Patience, MD 08/19/2022 10:16 PM

## 2022-08-20 ENCOUNTER — Inpatient Hospital Stay: Payer: No Typology Code available for payment source | Admitting: Oncology

## 2022-08-25 ENCOUNTER — Encounter: Payer: Self-pay | Admitting: Family Medicine

## 2022-08-25 ENCOUNTER — Ambulatory Visit: Payer: No Typology Code available for payment source | Admitting: Family Medicine

## 2022-08-25 VITALS — BP 109/69 | HR 86 | Temp 97.9°F | Ht 62.0 in | Wt 136.9 lb

## 2022-08-25 DIAGNOSIS — J3089 Other allergic rhinitis: Secondary | ICD-10-CM | POA: Insufficient documentation

## 2022-08-25 DIAGNOSIS — G2581 Restless legs syndrome: Secondary | ICD-10-CM

## 2022-08-25 MED ORDER — PRAMIPEXOLE DIHYDROCHLORIDE 0.125 MG PO TABS: ORAL_TABLET | ORAL | 3 refills | Status: DC

## 2022-08-25 MED ORDER — MONTELUKAST SODIUM 10 MG PO TABS: 10.0000 mg | ORAL_TABLET | Freq: Every day | ORAL | 3 refills | Status: DC

## 2022-08-25 NOTE — Assessment & Plan Note (Signed)
Will change from claritin to singulair to help with dryness. Advised flonase and nasal saline. Call if not getting better or getting worse. Continue zyrtec.

## 2022-08-25 NOTE — Progress Notes (Signed)
BP 109/69   Pulse 86   Temp 97.9 F (36.6 C) (Oral)   Ht _0  (1.575 m)   Wt 136 lb 14.4 oz (62.1 kg)   SpO2 98%   BMI 25.04 kg/m    Subjective:    Patient ID: Belinda [name], female    DOB: 11/19/1975, 46 y.o.   MRN: [id number]  HPI: Belinda [name] is a 46 y.o. female  Chief Complaint  Patient presents with   Leg Cramps   Sinus Problem    Patient says she is having sinus pain. Patient says it feels as it is a "burning sensation." Patient says she is currently Allergy (Claritin and Zyrtec). Patient denies having any other symptoms.    RESTLESS LEGS Duration: months Discomfort description:  Creeping and crawling Pain: yes Location: lower legs Bilateral: yes Symmetric: yes Severity: severe Onset:  gradual Frequency:   at night Symptoms only occur while legs at rest: yes Sudden unintentional leg jerking: yes Bed partner bothered by leg movements: yes LE numbness: yes Decreased sensation: yes Weakness: yes Insomnia: yes Daytime somnolence: yes Fatigue: yes Status: worse Treatments attempted: requip made her really tired  UPPER RESPIRATORY TRACT INFECTION Duration: for about 3 days Worst symptom: burning sinuses Fever: no Cough: no Shortness of breath: no Wheezing: no Chest pain: no Chest tightness: no Chest congestion: no Nasal congestion: no Runny nose: no Post nasal drip: no Sneezing: no Sore throat: no Swollen glands: no Sinus pressure: yes Headache: yes Face pain: yes Toothache: no Ear pain: no  Ear pressure: no  Eyes red/itching:no Eye drainage/crusting: no  Vomiting: no Rash: no Fatigue: no Sick contacts: no Strep contacts: no  Context: worse Recurrent sinusitis: no Relief with OTC cold/cough medications: no  Treatments attempted: anti-histamine     Relevant past medical, surgical, family and social history reviewed and updated as indicated. Interim medical history since our last visit reviewed. Allergies and medications  reviewed and updated.  Review of Systems  Constitutional: Negative.   HENT:  Positive for sinus pressure and sinus pain. Negative for congestion, dental problem, drooling, ear discharge, ear pain, facial swelling, hearing loss, mouth sores, nosebleeds, postnasal drip, rhinorrhea, sneezing, sore throat, tinnitus, trouble swallowing and voice change.   Respiratory: Negative.    Cardiovascular: Negative.   Gastrointestinal: Negative.   Musculoskeletal: Negative.   Psychiatric/Behavioral: Negative.      Per HPI unless specifically indicated above     Objective:    BP 109/69   Pulse 86   Temp 97.9 F (36.6 C) (Oral)   Ht _1  (1.575 m)   Wt 136 lb 14.4 oz (62.1 kg)   SpO2 98%   BMI 25.04 kg/m   Wt Readings from Last 3 Encounters:  08/25/22 136 lb 14.4 oz (62.1 kg)  07/21/22 136 lb 6.4 oz (61.9 kg)  07/18/22 135 lb 9.6 oz (61.5 kg)    Physical Exam Vitals and nursing note reviewed.  Constitutional:      General: She is not in acute distress.    Appearance: Normal appearance. She is normal weight. She is not ill-appearing, toxic-appearing or diaphoretic.  HENT:     Head: Normocephalic and atraumatic.     Right Ear: External ear normal.     Left Ear: External ear normal.     Nose: Nose normal.     Mouth/Throat:     Mouth: Mucous membranes are moist.     Pharynx: Oropharynx is clear.  Eyes:     General: No scleral  icterus.       Right eye: No discharge.        Left eye: No discharge.     Extraocular Movements: Extraocular movements intact.     Conjunctiva/sclera: Conjunctivae normal.     Pupils: Pupils are equal, round, and reactive to light.  Cardiovascular:     Rate and Rhythm: Normal rate and regular rhythm.     Pulses: Normal pulses.     Heart sounds: Normal heart sounds. No murmur heard.    No friction rub. No gallop.  Pulmonary:     Effort: Pulmonary effort is normal. No respiratory distress.     Breath sounds: Normal breath sounds. No stridor. No wheezing,  rhonchi or rales.  Chest:     Chest wall: No tenderness.  Musculoskeletal:        General: Normal range of motion.     Cervical back: Normal range of motion and neck supple.  Skin:    General: Skin is warm and dry.     Capillary Refill: Capillary refill takes less than 2 seconds.     Coloration: Skin is not jaundiced or pale.     Findings: No bruising, erythema, lesion or rash.  Neurological:     General: No focal deficit present.     Mental Status: She is alert and oriented to person, place, and time. Mental status is at baseline.  Psychiatric:        Mood and Affect: Mood normal.        Behavior: Behavior normal.        Thought Content: Thought content normal.        Judgment: Judgment normal.     Results for orders placed or performed in visit on 07/21/22  Comp Met (CMET)  Result Value Ref Range   Glucose 87 70 - 99 mg/dL   BUN 8 6 - 24 mg/dL   Creatinine, Ser 0.78 0.57 - 1.00 mg/dL   eGFR 95 >59 mL/min/1.73   BUN/Creatinine Ratio 10 9 - 23   Sodium 141 134 - 144 mmol/L   Potassium 4.4 3.5 - 5.2 mmol/L   Chloride 105 96 - 106 mmol/L   CO2 24 20 - 29 mmol/L   Calcium 9.3 8.7 - 10.2 mg/dL   Total Protein 6.5 6.0 - 8.5 g/dL   Albumin 4.4 3.9 - 4.9 g/dL   Globulin, Total 2.1 1.5 - 4.5 g/dL   Albumin/Globulin Ratio 2.1 1.2 - 2.2   Bilirubin Total 0.4 0.0 - 1.2 mg/dL   Alkaline Phosphatase 46 44 - 121 IU/L   AST 19 0 - 40 IU/L   ALT 17 0 - 32 IU/L  Magnesium  Result Value Ref Range   Magnesium 2.3 1.6 - 2.3 mg/dL  Phosphorus  Result Value Ref Range   Phosphorus 3.9 3.0 - 4.3 mg/dL  VITAMIN D 25 Hydroxy (Vit-D Deficiency, Fractures)  Result Value Ref Range   Vit D, 25-Hydroxy 35.9 30.0 - 100.0 ng/mL  TSH  Result Value Ref Range   TSH 1.520 0.450 - 4.500 uIU/mL  CBC with Differential/Platelet  Result Value Ref Range   WBC 6.9 3.4 - 10.8 x10E3/uL   RBC 3.92 3.77 - 5.28 x10E6/uL   Hemoglobin 12.5 11.1 - 15.9 g/dL   Hematocrit 37.4 34.0 - 46.6 %   MCV 95 79 - 97  fL   MCH 31.9 26.6 - 33.0 pg   MCHC 33.4 31.5 - 35.7 g/dL   RDW 12.5 11.7 - 15.4 %   Platelets 224  150 - 450 x10E3/uL   Neutrophils 54 Not Estab. %   Lymphs 37 Not Estab. %   Monocytes 6 Not Estab. %   Eos 2 Not Estab. %   Basos 1 Not Estab. %   Neutrophils Absolute 3.8 1.4 - 7.0 x10E3/uL   Lymphocytes Absolute 2.5 0.7 - 3.1 x10E3/uL   Monocytes Absolute 0.4 0.1 - 0.9 x10E3/uL   EOS (ABSOLUTE) 0.1 0.0 - 0.4 x10E3/uL   Basophils Absolute 0.1 0.0 - 0.2 x10E3/uL   Immature Granulocytes 0 Not Estab. %   Immature Grans (Abs) 0.0 0.0 - 0.1 x10E3/uL  Iron Binding Cap (TIBC)([facility])  Result Value Ref Range   Total Iron Binding Capacity 339 250 - 450 ug/dL   UIBC 209 131 - 425 ug/dL   Iron 130 27 - 159 ug/dL   Iron Saturation 38 15 - 55 %  Ferritin  Result Value Ref Range   Ferritin 121 15 - 150 ng/mL  B12  Result Value Ref Range   Vitamin B-12 425 232 - 1,245 pg/mL  Pregnancy, urine  Result Value Ref Range   Preg Test, Ur Negative Negative  hCG, serum, qualitative  Result Value Ref Range   hCG,Beta Subunit,Qual,Serum Negative Negative <6 mIU/mL      Assessment & Plan:   Problem List Items Addressed This Visit       Respiratory   Non-seasonal allergic rhinitis    Will change from claritin to singulair to help with dryness. Advised flonase and nasal saline. Call if not getting better or getting worse. Continue zyrtec.         Other   RLS (restless legs syndrome) - Primary    Unable to tolerate requip. Will change to mirapex and recheck 4-6 weeks. Call with any concerns.         Follow up plan: Return 4-6 weeks.

## 2022-08-25 NOTE — Assessment & Plan Note (Signed)
Unable to tolerate requip. Will change to mirapex and recheck 4-6 weeks. Call with any concerns.

## 2022-09-15 ENCOUNTER — Other Ambulatory Visit: Payer: Self-pay | Admitting: Nurse Practitioner

## 2022-09-16 NOTE — Telephone Encounter (Signed)
Requested medication (s) are due for refill today: Yes  Requested medication (s) are on the active medication list: Yes  Last refill:  08/19/22  Future visit scheduled: Yes  Notes to clinic:  Prescription has expired.    Requested Prescriptions  Pending Prescriptions Disp Refills   SUMAtriptan (IMITREX) 100 MG tablet [Pharmacy Med Name: SUMATRIPTAN SUCC 100 MG TABLET] 9 tablet 1    Sig: Take 1 tablet (100 mg total) by mouth once for 1 dose. May repeat in 2 hours if headache persists or recurs. Not to exceed 2 in 24 hours.     Neurology:  Migraine Therapy - Triptan Passed - 09/15/2022  1:36 AM      Passed - Last BP in normal range    BP Readings from Last 1 Encounters:  08/25/22 109/69         Passed - Valid encounter within last 12 months    Recent Outpatient Visits           3 weeks ago RLS (restless legs syndrome)   [facility] [place], Megan P, DO   1 month ago Leg cramps   [facility] [place], Megan P, DO   2 months ago Other migraine with status migrainosus, not intractable   [facility] Larae Grooms, NP   4 months ago Routine general medical examination at a health care facility   [facility], Megan P, DO   7 months ago Acute nonintractable headache, unspecified headache type   [facility] Dorcas Carrow, DO       Future Appointments             In 3 weeks Laural Benes, Oralia Rud, DO [facility]   In 5 months Rickard Patience, MD [facility]

## 2022-10-07 ENCOUNTER — Ambulatory Visit: Payer: No Typology Code available for payment source | Admitting: Family Medicine

## 2022-11-05 ENCOUNTER — Encounter: Payer: Self-pay | Admitting: Oncology

## 2022-12-21 ENCOUNTER — Other Ambulatory Visit: Payer: Self-pay | Admitting: Family Medicine

## 2022-12-22 NOTE — Telephone Encounter (Signed)
Requested Prescriptions  Pending Prescriptions Disp Refills   pramipexole (MIRAPEX) 0.125 MG tablet [Pharmacy Med Name: PRAMIPEXOLE 0.125 MG TABLET] 180 tablet 0    Sig: TAKE 1 TABLET BY MOUTH EVERY DAY AT BEDTIME IF TOLERATED INCREASE TO 2 TABS DAILY AFTER 1 WEEK     Neurology:  Parkinsonian Agents Passed - 12/21/2022  8:37 AM      Passed - Last BP in normal range    BP Readings from Last 1 Encounters:  08/25/22 109/69         Passed - Last Heart Rate in normal range    Pulse Readings from Last 1 Encounters:  08/25/22 86         Passed - Valid encounter within last 12 months    Recent Outpatient Visits           3 months ago RLS (restless legs syndrome)   [facility] [place], Megan P, DO   5 months ago Leg cramps   [place], [place] P, DO   5 months ago Other migraine with status migrainosus, not intractable   [place], NP   7 months ago Routine general medical examination at a health care facility   [facility], Megan P, DO   10 months ago Acute nonintractable headache, unspecified headache type   [place], [place], DO       Future Appointments             In 1 month Earlie Server, MD [place] [facility]

## 2023-01-18 ENCOUNTER — Encounter (HOSPITAL_COMMUNITY): Payer: Self-pay | Admitting: Emergency Medicine

## 2023-01-18 ENCOUNTER — Emergency Department (HOSPITAL_COMMUNITY)
Admission: EM | Admit: 2023-01-18 | Discharge: 2023-01-19 | Disposition: A | Discharge status: Home / Self Care | Payer: BLUE CROSS/BLUE SHIELD | Attending: Emergency Medicine | Admitting: Emergency Medicine

## 2023-01-18 ENCOUNTER — Emergency Department (HOSPITAL_COMMUNITY): Payer: BLUE CROSS/BLUE SHIELD

## 2023-01-18 ENCOUNTER — Other Ambulatory Visit: Payer: Self-pay

## 2023-01-18 ENCOUNTER — Encounter: Payer: Self-pay | Admitting: Oncology

## 2023-01-18 DIAGNOSIS — S20211A Contusion of right front wall of thorax, initial encounter: Secondary | ICD-10-CM | POA: Diagnosis not present

## 2023-01-18 DIAGNOSIS — R55 Syncope and collapse: Secondary | ICD-10-CM | POA: Diagnosis not present

## 2023-01-18 DIAGNOSIS — W1830XA Fall on same level, unspecified, initial encounter: Secondary | ICD-10-CM | POA: Insufficient documentation

## 2023-01-18 DIAGNOSIS — Z7982 Long term (current) use of aspirin: Secondary | ICD-10-CM | POA: Diagnosis not present

## 2023-01-18 DIAGNOSIS — Y92009 Unspecified place in unspecified non-institutional (private) residence as the place of occurrence of the external cause: Secondary | ICD-10-CM | POA: Insufficient documentation

## 2023-01-18 DIAGNOSIS — R0781 Pleurodynia: Secondary | ICD-10-CM | POA: Diagnosis present

## 2023-01-18 DIAGNOSIS — R7309 Other abnormal glucose: Secondary | ICD-10-CM

## 2023-01-18 LAB — CBG MONITORING, ED: Glucose-Capillary: 120 mg/dL — ABNORMAL HIGH (ref 70–99)

## 2023-01-18 LAB — URINALYSIS, ROUTINE W REFLEX MICROSCOPIC
Bacteria, UA: NONE SEEN
Bilirubin Urine: NEGATIVE
Glucose, UA: NEGATIVE mg/dL
Ketones, ur: 5 mg/dL — AB
Leukocytes,Ua: NEGATIVE
Nitrite: NEGATIVE
Protein, ur: NEGATIVE mg/dL
Specific Gravity, Urine: 1.016 (ref 1.005–1.030)
pH: 5 (ref 5.0–8.0)

## 2023-01-18 LAB — CBC
HCT: 36 % (ref 36.0–46.0)
Hemoglobin: 12.5 g/dL (ref 12.0–15.0)
MCH: 33.1 pg (ref 26.0–34.0)
MCHC: 34.7 g/dL (ref 30.0–36.0)
MCV: 95.2 fL (ref 80.0–100.0)
Platelets: 197 10*3/uL (ref 150–400)
RBC: 3.78 MIL/uL — ABNORMAL LOW (ref 3.87–5.11)
RDW: 12.7 % (ref 11.5–15.5)
WBC: 11.6 10*3/uL — ABNORMAL HIGH (ref 4.0–10.5)
nRBC: 0 % (ref 0.0–0.2)

## 2023-01-18 LAB — BASIC METABOLIC PANEL
Anion gap: 12 (ref 5–15)
BUN: 16 mg/dL (ref 6–20)
CO2: 19 mmol/L — ABNORMAL LOW (ref 22–32)
Calcium: 9 mg/dL (ref 8.9–10.3)
Chloride: 106 mmol/L (ref 98–111)
Creatinine, Ser: 0.72 mg/dL (ref 0.44–1.00)
GFR, Estimated: >60 mL/min (ref >60)
Glucose, Bld: 114 mg/dL — ABNORMAL HIGH (ref 70–99)
Potassium: 3.7 mmol/L (ref 3.5–5.1)
Sodium: 137 mmol/L (ref 135–145)

## 2023-01-18 LAB — I-STAT BETA HCG BLOOD, ED (MC, WL, AP ONLY): I-stat hCG, quantitative: <5 m[IU]/mL (ref <5)

## 2023-01-18 NOTE — ED Triage Notes (Signed)
Pt reports syncopal episode at home this evening while walking in the house. Pt reports headache, weakness and nausea. Pt currently awake, alert, appropriate.

## 2023-01-19 LAB — CBG MONITORING, ED: Glucose-Capillary: 88 mg/dL (ref 70–99)

## 2023-01-19 MED ORDER — SODIUM CHLORIDE 0.9 % IV BOLUS
1000.0000 mL | Freq: Once | INTRAVENOUS | Status: AC
  Administered 2023-01-19: 1000 mL via INTRAVENOUS

## 2023-01-19 MED ORDER — ONDANSETRON HCL 4 MG/2ML IJ SOLN
4.0000 mg | Freq: Once | INTRAMUSCULAR | Status: AC
  Administered 2023-01-19: 4 mg via INTRAVENOUS
  Filled 2023-01-19: qty 2

## 2023-01-19 MED ORDER — ONDANSETRON 8 MG PO TBDP: 8.0000 mg | ORAL_TABLET | Freq: Three times a day (TID) | ORAL | 0 refills | Status: DC | PRN

## 2023-01-19 NOTE — ED Provider Notes (Signed)
[facility] AT [facility] Provider Note   CSN: [id number] Arrival date & time: 01/18/23  2018     History  Chief Complaint  Patient presents with   Loss of Consciousness    Belinda [name] is a 47 y.o. female.  The history is provided by the patient.  Loss of Consciousness She has history of pre-diabetes, peptic ulcer disease, schizophrenia, GERD and comes in following a syncopal episode at home.  She received a B12 injection and states that she felt funny while the needle was in her arm.  She got up and had a syncopal episode.  She denies chest pain, heaviness, tightness, pressure.  She does endorse nausea and diaphoresis.  After she regained consciousness, she did have some intermittent, mild palpitations.  She denies chest pain, heaviness, tightness, pressure.  She did hit her right rib cage and she is complaining of pain in the right lateral rib cage and also is complaining of pain in her right knee.  Her partner who was present states loss of consciousness was less than 2 minutes, no seizure activity seen.  There is no incontinence.  There is no bit lip or tongue.  She does complain of some ongoing mild nausea.   Home Medications Prior to Admission medications   Medication Sig Start Date End Date Taking? Authorizing Provider  SUMAtriptan (IMITREX) 100 MG tablet TAKE 1 TABLET (100 MG TOTAL) BY MOUTH ONCE FOR 1 DOSE. MAY REPEAT IN 2 HOURS IF HEADACHE PERSISTS OR RECURS. NOT TO EXCEED 2 IN 24 HOURS. 09/16/22 09/16/22  Olevia Perches P, DO  aspirin EC 81 MG tablet Take 1 tablet (81 mg total) by mouth daily. Swallow whole. 05/13/22   Johnson, Megan P, DO  B-D 3CC LUER-LOK SYR 25GX1" 25G X 1" 3 ML MISC  02/27/16   [provider]  cyanocobalamin (,VITAMIN B-12,) 1000 MCG/ML injection Inject 1 mL (1,000 mcg total) into the muscle once a week. 05/02/21   Johnson, Megan P, DO  lidocaine (LIDODERM) 5 % Place 1 patch onto the skin daily. Remove & Discard  patch within 12 hours or as directed by MD 10/14/17   Particia Nearing, PA-C  montelukast (SINGULAIR) 10 MG tablet Take 1 tablet (10 mg total) by mouth at bedtime. 08/25/22   Johnson, Megan P, DO  ondansetron (ZOFRAN) 4 MG tablet Take 1 tablet (4 mg total) by mouth every 8 (eight) hours as needed. for nausea 05/14/22   Olevia Perches P, DO  pramipexole (MIRAPEX) 0.125 MG tablet TAKE 1 TABLET BY MOUTH EVERY DAY AT BEDTIME IF TOLERATED INCREASE TO 2 TABS DAILY AFTER 1 WEEK 12/22/22   Johnson, Megan P, DO  Probiotic Product ([employer] MOOD SUPPORT PROBIOTIC PO) Take by mouth daily.    [provider]  Vitamin D, Ergocalciferol, (DRISDOL) 1.25 MG (50000 UNIT) CAPS capsule Take 1 capsule (50,000 Units total) by mouth every 7 (seven) days. 11/25/21   Olevia Perches P, DO      Allergies    Acetaminophen, Valproic acid, and Depakote [divalproex sodium]    Review of Systems   Review of Systems  Cardiovascular:  Positive for syncope.  All other systems reviewed and are negative.   Physical Exam Updated Vital Signs BP 112/66 (BP Location: Left Arm)   Pulse 62   Temp 98.2 F (36.8 C) (Oral)   Resp 17   Ht 5\' 2"  (1.575 m)   Wt 59 kg   LMP 01/02/2023   SpO2 100%  BMI 23.78 kg/m  Physical Exam Vitals and nursing note reviewed.   47 year old female, resting comfortably and in no acute distress. Vital signs are normal. Oxygen saturation is 100%, which is normal. Head is normocephalic and atraumatic. PERRLA, EOMI. Oropharynx is clear. Neck is nontender and supple without adenopathy or JVD.  There are no carotid bruits. Back is nontender and there is no CVA tenderness. Lungs are clear without rales, wheezes, or rhonchi. Chest has mild tenderness in the right lateral rib cage inferiorly.  There is no crepitus. Heart has regular rate and rhythm without murmur. Abdomen is soft, flat, nontender. Extremities have no cyanosis or edema, full range of motion is present. Skin is warm and dry  without rash. Neurologic: Mental status is normal, cranial nerves are intact, moves all extremities equally.  ED Results / Procedures / Treatments   Labs (all labs ordered are listed, but only abnormal results are displayed) Labs Reviewed  BASIC METABOLIC PANEL - Abnormal; Notable for the following components:      Result Value   CO2 19 (*)    Glucose, Bld 114 (*)    All other components within normal limits  CBC - Abnormal; Notable for the following components:   WBC 11.6 (*)    RBC 3.78 (*)    All other components within normal limits  URINALYSIS, ROUTINE W REFLEX MICROSCOPIC - Abnormal; Notable for the following components:   Hgb urine dipstick SMALL (*)    Ketones, ur 5 (*)    All other components within normal limits  CBG MONITORING, ED - Abnormal; Notable for the following components:   Glucose-Capillary 120 (*)    All other components within normal limits  I-STAT BETA HCG BLOOD, ED ([facility], [facility], [facility] ONLY)    EKG EKG Interpretation  Date/Time:  Monday January 19 2023 01:34:48 EDT Ventricular Rate:  56 PR Interval:  109 QRS Duration: 91 QT Interval:  424 QTC Calculation: 410 R Axis:   58 Text Interpretation: Sinus rhythm Short PR interval Low voltage, precordial leads When compared with ECG of 03/22/2018, HEART RATE has decreased Confirmed by Dione Booze ([id number]) on 01/19/2023 1:40:42 AM  Radiology DG Knee Complete 4 Views Right  Result Date: 01/18/2023 CLINICAL DATA:  Fall EXAM: RIGHT KNEE - COMPLETE 4+ VIEW COMPARISON:  None Available. FINDINGS: No fracture or dislocation is seen. The joint spaces are preserved. The visualized soft tissues are unremarkable. No suprapatellar knee joint effusion. IMPRESSION: Negative. Electronically Signed   By: Charline Bills M.D.   On: 01/18/2023 22:14   DG Ribs Unilateral W/Chest Right  Result Date: 01/18/2023 CLINICAL DATA:  Fall EXAM: RIGHT RIBS AND CHEST - 3+ VIEW COMPARISON:  None Available. FINDINGS: Lungs are clear.  No pleural  effusion or pneumothorax. The heart is normal in size. No displaced right rib fracture is seen. IMPRESSION: Negative. Electronically Signed   By: Charline Bills M.D.   On: 01/18/2023 22:14    Procedures Procedures  Cardiac monitor showed normal sinus rhythm, per my interpretation.  Medications Ordered in ED Medications  ondansetron (ZOFRAN) injection 4 mg (4 mg Intravenous Given 01/19/23 0214)  sodium chloride 0.9 % bolus 1,000 mL (1,000 mLs Intravenous New Bag/Given 01/19/23 0214)    ED Course/ Medical Decision Making/ A&P                             Medical Decision Making Amount and/or Complexity of Data Reviewed Labs: ordered.  Risk  Prescription drug management.   Syncopal episode which sounds like it was vasovagal based on associated symptoms.  Doubt arrhythmia, pulmonary embolism, seizure.  I have reviewed and interpreted her laboratory test, and my interpretation is mildly elevated random glucose level consistent with known history of prediabetes, mild leukocytosis which is nonspecific.  Urinalysis is unremarkable.  Rib x-rays and knee x-rays show no fracture.  I have independently viewed the images, and agree with the radiologist's interpretation.  I have ordered an electrocardiogram, orthostatic vital signs, intravenous fluids, intravenous ondansetron.  I have reviewed and interpreted her electrocardiogram and my interpretation is short PR interval but otherwise normal ECG.  Orthostatic vital signs show no significant change in heart rate or blood pressure.  She feels much better following above-noted treatment.  I am discharging her with a prescription for ondansetron oral dissolving tablet, advised to apply ice to sore areas and use over-the-counter NSAIDs as needed for pain.  Final Clinical Impression(s) / ED Diagnoses Final diagnoses:  Vasovagal syncope  Chest wall contusion, right, initial encounter  Elevated random blood glucose level    Rx / DC Orders ED Discharge  Orders          Ordered    ondansetron (ZOFRAN-ODT) 8 MG disintegrating tablet  Every 8 hours PRN        01/19/23 0329              Dione Booze, MD 01/19/23 [phone]

## 2023-01-19 NOTE — Discharge Instructions (Signed)
Apply ice to areas that are sore.  Ice to be applied for 30 minutes at a time, 4 times a day.  You may take ibuprofen as needed for pain.

## 2023-01-21 ENCOUNTER — Telehealth: Payer: Self-pay

## 2023-01-21 NOTE — Telephone Encounter (Signed)
-----   Message from Pablo Ledger, CMA sent at 01/21/2023 11:47 AM EDT ----- Needs TOC call completed please.

## 2023-01-21 NOTE — Transitions of Care (Post Inpatient/ED Visit) (Signed)
   01/21/2023  Name: EMERITA BERKEMEIER MRN: [id number] DOB: [birth year]  Today's TOC FU Call Status: Today's TOC FU Call Status:: Successful TOC FU Call Competed  Transition Care Management Follow-up Telephone Call Date of Discharge: 01/18/23 Discharge Facility[facility]) Type of Discharge: Emergency Department Reason for ED Visit: Other: How have you been since you were released from the hospital?: Better Any questions or concerns?: No  Items Reviewed: Did you receive and understand the discharge instructions provided?: Yes Medications obtained and verified?: Yes (Medications Reviewed) Any new allergies since your discharge?: No Dietary orders reviewed?: NA Do you have support at home?: Yes People in Home: significant other  Home Care and Equipment/Supplies: Were Home Health Services Ordered?: NA Any new equipment or medical supplies ordered?: NA  Functional Questionnaire: Do you need assistance with bathing/showering or dressing?: No Do you need assistance with meal preparation?: No Do you need assistance with eating?: No Do you have difficulty maintaining continence: No Do you need assistance with getting out of bed/getting out of a chair/moving?: No Do you have difficulty managing or taking your medications?: No  Follow up appointments reviewed: PCP Follow-up appointment confirmed?: No MD Provider Line [phone] Given: Yes Specialist Hospital Follow-up appointment confirmed?: NA Do you need transportation to your follow-up appointment?: Yes Transportation Need Intervention Addressed By:: Provider Office Notified Do you understand care options if your condition(s) worsen?: Yes-patient verbalized understanding    SIGNATURE Malen Gauze, CMA

## 2023-01-21 NOTE — Telephone Encounter (Signed)
Entered in error

## 2023-02-10 ENCOUNTER — Encounter: Payer: Self-pay | Admitting: Oncology

## 2023-02-17 ENCOUNTER — Inpatient Hospital Stay: Payer: BLUE CROSS/BLUE SHIELD | Admitting: Oncology

## 2023-02-26 ENCOUNTER — Other Ambulatory Visit: Payer: Self-pay | Admitting: Family Medicine

## 2023-02-26 NOTE — Telephone Encounter (Signed)
Requested medication (s) are due for refill today - yes  Requested medication (s) are on the active medication list -yes  Future visit scheduled -no  Last refill: 01/19/23 #20  Notes to clinic: non delegated Rx  Requested Prescriptions  Pending Prescriptions Disp Refills   ondansetron (ZOFRAN) 4 MG tablet [Pharmacy Med Name: ONDANSETRON HCL 4 MG TABLET] 20 tablet 3    Sig: TAKE 1 TABLET (4 MG TOTAL) BY MOUTH EVERY 8 HOURS AS NEEDED FOR NAUSEA     Not Delegated - Gastroenterology: Antiemetics - ondansetron Failed - 02/26/2023  4:27 PM      Failed - This refill cannot be delegated      Failed - Valid encounter within last 6 months    Recent Outpatient Visits           6 months ago RLS (restless legs syndrome)   [facility] [place], Megan P, DO   7 months ago Leg cramps   [place] [facility] [place], Megan P, DO   7 months ago Other migraine with status migrainosus, not intractable   [place] [facility] Larae Grooms, NP   9 months ago Routine general medical examination at a health care facility   [facility] [place] P, DO   1 year ago Acute nonintractable headache, unspecified headache type   [facility] Dorcas Carrow, DO       Future Appointments             In 2 weeks Rickard Patience, MD [facility] - AST in normal range and within 360 days    AST  Date Value Ref Range Status  07/21/2022 19 0 - 40 IU/L Final         Passed - ALT in normal range and within 360 days    ALT  Date Value Ref Range Status  07/21/2022 17 0 - 32 IU/L Final            Requested Prescriptions  Pending Prescriptions Disp Refills   ondansetron (ZOFRAN) 4 MG tablet [Pharmacy Med Name: ONDANSETRON HCL 4 MG TABLET] 20 tablet 3    Sig: TAKE 1 TABLET (4 MG TOTAL) BY MOUTH EVERY 8 HOURS AS NEEDED FOR NAUSEA     Not  Delegated - Gastroenterology: Antiemetics - ondansetron Failed - 02/26/2023  4:27 PM      Failed - This refill cannot be delegated      Failed - Valid encounter within last 6 months    Recent Outpatient Visits           6 months ago RLS (restless legs syndrome)   [place] [facility] [place], Megan P, DO   7 months ago Leg cramps   [facility] [place], Megan P, DO   7 months ago Other migraine with status migrainosus, not intractable   [place] [facility] Larae Grooms, NP   9 months ago Routine general medical examination at a health care facility   [facility] [place] P, DO   1 year ago Acute nonintractable headache, unspecified headache type   [place] [facility] Dorcas Carrow, DO       Future Appointments             In 2 weeks Rickard Patience, MD [facility] - AST in normal range and within 360 days    AST  Date Value Ref Range Status  07/21/2022 19 0 - 40 IU/L Final         Passed - ALT in normal range and within 360 days    ALT  Date Value Ref Range Status  07/21/2022 17 0 - 32 IU/L Final

## 2023-03-18 ENCOUNTER — Inpatient Hospital Stay: Payer: BLUE CROSS/BLUE SHIELD | Admitting: Oncology

## 2023-03-25 ENCOUNTER — Telehealth: Payer: Self-pay

## 2023-03-25 NOTE — Transitions of Care (Post Inpatient/ED Visit) (Signed)
   03/25/2023  Name: Belinda [name] MRN: [id number] DOB: November 23, 1975  Today's TOC FU Call Status:    Attempted to reach the patient regarding the most recent Inpatient/ED visit.  Follow Up Plan: Additional outreach attempts will be made to reach the patient to complete the Transitions of Care (Post Inpatient/ED visit) call.   Signature Malen Gauze, [place]

## 2023-03-25 NOTE — Telephone Encounter (Signed)
-----   Message from Pablo Ledger, CMA sent at 03/20/2023  8:46 AM EDT ----- Patient needs TOC call completed please.

## 2023-11-15 ENCOUNTER — Other Ambulatory Visit: Payer: Self-pay | Admitting: Family Medicine

## 2023-11-16 NOTE — Telephone Encounter (Signed)
 Requested medication (s) are due for refill today - unknown  Requested medication (s) are on the active medication list -yes  Future visit scheduled -no  Last refill: 01/19/23 #20  Notes to clinic: no longer current patient, non delegated Rx  Requested Prescriptions  Pending Prescriptions Disp Refills   ondansetron  (ZOFRAN ) 4 MG tablet [Pharmacy Med Name: ONDANSETRON  HCL 4 MG TABLET] 20 tablet 3    Sig: TAKE 1 TABLET BY MOUTH EVERY 8 HOURS AS NEEDED FOR NAUSEA     Not Delegated - Gastroenterology: Antiemetics - ondansetron  Failed - 11/16/2023  2:27 PM      Failed - This refill cannot be delegated      Failed - AST in normal range and within 360 days    AST  Date Value Ref Range Status  07/21/2022 19 0 - 40 IU/L Final         Failed - ALT in normal range and within 360 days    ALT  Date Value Ref Range Status  07/21/2022 17 0 - 32 IU/L Final         Failed - Valid encounter within last 6 months    Recent Outpatient Visits           1 year ago RLS (restless legs syndrome)   [facility] [facility] [place], Megan P, DO   1 year ago Leg cramps   [place] [facility] [place], Megan P, DO   1 year ago Other migraine with status migrainosus, not intractable   [place] [facility] A[place]en Alexanders, NP   1 year ago Routine general medical examination at a health care facility   [facility], [place] P, DO   1 year ago Acute nonintractable headache, unspecified headache type   [place] [facility] [place], Megan P, DO                 Requested Prescriptions  Pending Prescriptions Disp Refills   ondansetron  (ZOFRAN ) 4 MG tablet [Pharmacy Med Name: ONDANSETRON  HCL 4 MG TABLET] 20 tablet 3    Sig: TAKE 1 TABLET BY MOUTH EVERY 8 HOURS AS NEEDED FOR NAUSEA     Not Delegated - Gastroenterology: Antiemetics - ondansetron  Failed - 11/16/2023  2:27 PM      Failed - This refill cannot be  delegated      Failed - AST in normal range and within 360 days    AST  Date Value Ref Range Status  07/21/2022 19 0 - 40 IU/L Final         Failed - ALT in normal range and within 360 days    ALT  Date Value Ref Range Status  07/21/2022 17 0 - 32 IU/L Final         Failed - Valid encounter within last 6 months    Recent Outpatient Visits           1 year ago RLS (restless legs syndrome)   [place] [facility] [place], Megan P, DO   1 year ago Leg cramps   [place] [facility] [place], Megan P, DO   1 year ago Other migraine with status migrainosus, not intractable   [place] [facility] Aileen Alexanders, NP   1 year ago Routine general medical examination at a health care facility   [facility], [place] P, DO   1 year ago Acute nonintractable headache, unspecified headache  type   [place] [facility] [place], [place], [place]

## 2023-11-25 ENCOUNTER — Encounter: Payer: Self-pay | Admitting: Oncology

## 2023-11-26 ENCOUNTER — Ambulatory Visit: Payer: Self-pay | Admitting: Family Medicine

## 2023-11-26 ENCOUNTER — Encounter: Payer: Self-pay | Admitting: Family Medicine

## 2023-11-26 ENCOUNTER — Encounter: Payer: Self-pay | Admitting: Oncology

## 2023-11-26 VITALS — BP 107/70 | HR 91 | Temp 98.1°F | Wt 141.6 lb

## 2023-11-26 DIAGNOSIS — K529 Noninfective gastroenteritis and colitis, unspecified: Secondary | ICD-10-CM | POA: Diagnosis not present

## 2023-11-26 DIAGNOSIS — M25552 Pain in left hip: Secondary | ICD-10-CM | POA: Diagnosis not present

## 2023-11-26 NOTE — Progress Notes (Signed)
BP 107/70 (BP Location: Right Arm, Patient Position: Sitting, Cuff Size: Small)   Pulse 91   Temp 98.1 F (36.7 C) (Oral)   Wt 141 lb 9.6 oz (64.2 kg)   LMP 11/12/2023 (Exact Date)   SpO2 99%   BMI 25.90 kg/m    Subjective:    Patient ID: Belinda [name], female    DOB: March 09, 1976, 48 y.o.   MRN: [id number]  HPI: Belinda [name] is a 48 y.o. female  Chief Complaint  Patient presents with   Abdominal Pain    Patient states she had her first full course meal yesterday   Headache    Patient states her headache comes and go that it's more in the middle or her head and forehead  Patient feels like it maybe because she really haven't ate   Hip Pain    Patient states the hip pain been going on for a year Patient states she did therapy and everything still hurts    Had a stomach bug this week. Was throwing up Monday and Tuesday- feeling better. Still having some headaches. Starting to feel better, but still not quite there. Only ate a full meal last night.   HIP PAIN Duration: at least a year Involved hip: left  Mechanism of injury: unknown Location: diffuse Onset: gradual  Severity: 6/10  Quality: aching, burning nerve Frequency: almost constant Radiation: into her thigh, into her butt Aggravating factors: walking and moving   Alleviating factors: rest and heat, mineral ice, salt baths  Status: worse Treatments attempted: rest, ice, heat, APAP, ibuprofen, aleve, and HEP   Relief with NSAIDs?: no Weakness with weight bearing: no Weakness with walking: no Paresthesias / decreased sensation: yes Swelling: no Redness:no Fevers: no   Relevant past medical, surgical, family and social history reviewed and updated as indicated. Interim medical history since our last visit reviewed. Allergies and medications reviewed and updated.  Review of Systems  Constitutional:  Positive for fatigue. Negative for activity change, appetite change, chills, diaphoresis, fever and  unexpected weight change.  Respiratory: Negative.    Cardiovascular: Negative.   Gastrointestinal:  Positive for abdominal distention, abdominal pain, diarrhea, nausea and vomiting. Negative for anal bleeding, blood in stool, constipation and rectal pain.  Musculoskeletal:  Positive for arthralgias, back pain, gait problem and myalgias. Negative for joint swelling, neck pain and neck stiffness.  Skin: Negative.   Neurological:  Positive for weakness. Negative for dizziness, tremors, seizures, syncope, facial asymmetry, speech difficulty, light-headedness, numbness and headaches.  Psychiatric/Behavioral: Negative.      Per HPI unless specifically indicated above     Objective:    BP 107/70 (BP Location: Right Arm, Patient Position: Sitting, Cuff Size: Small)   Pulse 91   Temp 98.1 F (36.7 C) (Oral)   Wt 141 lb 9.6 oz (64.2 kg)   LMP 11/12/2023 (Exact Date)   SpO2 99%   BMI 25.90 kg/m   Wt Readings from Last 3 Encounters:  11/26/23 141 lb 9.6 oz (64.2 kg)  01/18/23 130 lb (59 kg)  08/25/22 136 lb 14.4 oz (62.1 kg)    Physical Exam Vitals and nursing note reviewed.  Constitutional:      General: She is not in acute distress.    Appearance: Normal appearance. She is not ill-appearing, toxic-appearing or diaphoretic.  HENT:     Head: Normocephalic and atraumatic.     Right Ear: External ear normal.     Left Ear: External ear normal.     Nose:  Nose normal.     Mouth/Throat:     Mouth: Mucous membranes are moist.     Pharynx: Oropharynx is clear.  Eyes:     General: No scleral icterus.       Right eye: No discharge.        Left eye: No discharge.     Extraocular Movements: Extraocular movements intact.     Conjunctiva/sclera: Conjunctivae normal.     Pupils: Pupils are equal, round, and reactive to light.  Cardiovascular:     Rate and Rhythm: Normal rate and regular rhythm.     Pulses: Normal pulses.     Heart sounds: Normal heart sounds. No murmur heard.    No friction  rub. No gallop.  Pulmonary:     Effort: Pulmonary effort is normal. No respiratory distress.     Breath sounds: Normal breath sounds. No stridor. No wheezing, rhonchi or rales.  Chest:     Chest wall: No tenderness.  Abdominal:     General: Abdomen is flat. Bowel sounds are normal.     Palpations: Abdomen is soft.     Tenderness: There is no abdominal tenderness.  Musculoskeletal:        General: Normal range of motion.     Cervical back: Normal range of motion and neck supple.  Skin:    General: Skin is warm and dry.     Capillary Refill: Capillary refill takes less than 2 seconds.     Coloration: Skin is not jaundiced or pale.     Findings: No bruising, erythema, lesion or rash.  Neurological:     General: No focal deficit present.     Mental Status: She is alert and oriented to person, place, and time. Mental status is at baseline.  Psychiatric:        Mood and Affect: Mood normal.        Behavior: Behavior normal.        Thought Content: Thought content normal.        Judgment: Judgment normal.     Results for orders placed or performed during the hospital encounter of 01/18/23  Urinalysis, Routine w reflex microscopic -Urine, Clean Catch   Collection Time: 01/18/23  8:57 PM  Result Value Ref Range   Color, Urine YELLOW YELLOW   APPearance CLEAR CLEAR   Specific Gravity, Urine 1.016 1.005 - 1.030   pH 5.0 5.0 - 8.0   Glucose, UA NEGATIVE NEGATIVE mg/dL   Hgb urine dipstick SMALL (A) NEGATIVE   Bilirubin Urine NEGATIVE NEGATIVE   Ketones, ur 5 (A) NEGATIVE mg/dL   Protein, ur NEGATIVE NEGATIVE mg/dL   Nitrite NEGATIVE NEGATIVE   Leukocytes,Ua NEGATIVE NEGATIVE   RBC / HPF 0-5 0 - 5 RBC/hpf   WBC, UA 0-5 0 - 5 WBC/hpf   Bacteria, UA NONE SEEN NONE SEEN   Squamous Epithelial / HPF 0-5 0 - 5 /HPF   Mucus PRESENT   CBG monitoring, ED   Collection Time: 01/18/23  9:05 PM  Result Value Ref Range   Glucose-Capillary 120 (H) 70 - 99 mg/dL  Basic metabolic panel    Collection Time: 01/18/23  9:11 PM  Result Value Ref Range   Sodium 137 135 - 145 mmol/L   Potassium 3.7 3.5 - 5.1 mmol/L   Chloride 106 98 - 111 mmol/L   CO2 19 (L) 22 - 32 mmol/L   Glucose, Bld 114 (H) 70 - 99 mg/dL   BUN 16 6 - 20 mg/dL  Creatinine, Ser 0.72 0.44 - 1.00 mg/dL   Calcium 9.0 8.9 - 16.1 mg/dL   GFR, Estimated >09 >60 mL/min   Anion gap 12 5 - 15  CBC   Collection Time: 01/18/23  9:11 PM  Result Value Ref Range   WBC 11.6 (H) 4.0 - 10.5 K/uL   RBC 3.78 (L) 3.87 - 5.11 MIL/uL   Hemoglobin 12.5 12.0 - 15.0 g/dL   HCT 45.4 09.8 - 11.9 %   MCV 95.2 80.0 - 100.0 fL   MCH 33.1 26.0 - 34.0 pg   MCHC 34.7 30.0 - 36.0 g/dL   RDW 14.7 82.9 - 56.2 %   Platelets 197 150 - 400 K/uL   nRBC 0.0 0.0 - 0.2 %  I-Stat beta hCG blood, ED   Collection Time: 01/18/23  9:15 PM  Result Value Ref Range   I-stat hCG, quantitative <5.0 <5 mIU/mL   Comment 3          CBG monitoring, ED   Collection Time: 01/19/23  1:30 AM  Result Value Ref Range   Glucose-Capillary 88 70 - 99 mg/dL      Assessment & Plan:   Problem List Items Addressed This Visit   None Visit Diagnoses       Left hip pain    -  Primary   Will refer to ortho. Call with any concerns. Continue to monitor. Call with any concerns.   Relevant Orders   Ambulatory referral to Orthopedic Surgery     Gastroenteritis       Resolved, but still not 100%. Rest. Gentle diet. Fluids. Call if not getting better or getting worse.        Follow up plan: Return as able, for physical.

## 2023-12-21 ENCOUNTER — Encounter: Payer: Self-pay | Admitting: Family Medicine

## 2023-12-21 ENCOUNTER — Ambulatory Visit (INDEPENDENT_AMBULATORY_CARE_PROVIDER_SITE_OTHER): Payer: No Typology Code available for payment source | Admitting: Family Medicine

## 2023-12-21 VITALS — BP 96/65 | HR 76 | Ht 62.21 in | Wt 146.4 lb

## 2023-12-21 DIAGNOSIS — F319 Bipolar disorder, unspecified: Secondary | ICD-10-CM

## 2023-12-21 DIAGNOSIS — E538 Deficiency of other specified B group vitamins: Secondary | ICD-10-CM | POA: Diagnosis not present

## 2023-12-21 DIAGNOSIS — G2581 Restless legs syndrome: Secondary | ICD-10-CM | POA: Diagnosis not present

## 2023-12-21 DIAGNOSIS — D509 Iron deficiency anemia, unspecified: Secondary | ICD-10-CM

## 2023-12-21 DIAGNOSIS — Z1231 Encounter for screening mammogram for malignant neoplasm of breast: Secondary | ICD-10-CM | POA: Diagnosis not present

## 2023-12-21 DIAGNOSIS — Z0000 Encounter for general adult medical examination without abnormal findings: Secondary | ICD-10-CM | POA: Diagnosis not present

## 2023-12-21 DIAGNOSIS — Z3169 Encounter for other general counseling and advice on procreation: Secondary | ICD-10-CM

## 2023-12-21 DIAGNOSIS — E559 Vitamin D deficiency, unspecified: Secondary | ICD-10-CM

## 2023-12-21 MED ORDER — CYANOCOBALAMIN 1000 MCG/ML IJ SOLN: 1000.0000 ug | INTRAMUSCULAR | 4 refills | Status: AC

## 2023-12-21 MED ORDER — PRAMIPEXOLE DIHYDROCHLORIDE 0.125 MG PO TABS: ORAL_TABLET | ORAL | 0 refills | Status: AC

## 2023-12-21 MED ORDER — ONDANSETRON HCL 4 MG PO TABS: 4.0000 mg | ORAL_TABLET | Freq: Three times a day (TID) | ORAL | 3 refills | Status: DC | PRN

## 2023-12-21 MED ORDER — MONTELUKAST SODIUM 10 MG PO TABS: 10.0000 mg | ORAL_TABLET | Freq: Every day | ORAL | 3 refills | Status: AC

## 2023-12-21 NOTE — Assessment & Plan Note (Signed)
 Rechecking labs today. Await results. Treat as needed.

## 2023-12-21 NOTE — Assessment & Plan Note (Signed)
Doing well not on medicine. Continue to monitor. Call with any concerns.

## 2023-12-21 NOTE — Patient Instructions (Addendum)
 [facility] [address], [place], [place] [address] Phone: [phone]  Please call to schedule your mammogram and/or bone density: [facility]  Address: [address], [place], [place] [address] Phone: [phone]  [facility] [address] [place],  [place]  [address] Phone: [phone]

## 2023-12-21 NOTE — Assessment & Plan Note (Signed)
 Under good control on current regimen. Continue current regimen. Continue to monitor. Call with any concerns. Refills given. Labs drawn today.

## 2023-12-21 NOTE — Progress Notes (Signed)
 BP 96/65 (BP Location: Left Arm, Patient Position: Sitting, Cuff Size: Large)   Pulse 76   Ht 5' 2.21" (1.58 m)   Wt 146 lb 6.4 oz (66.4 kg)   LMP 11/12/2023 (Exact Date)   SpO2 99%   BMI 26.60 kg/m    Subjective:    Patient ID: Belinda [name], female    DOB: [birth year], 48 y.o.   MRN: [id number]  HPI: Belinda [name] is a 48 y.o. female presenting on 12/21/2023 for comprehensive medical examination. Current medical complaints include:  RLS has been better. Tolerating medicine well. No concerns.   DEPRESSION Mood status: stable Satisfied with current treatment?: yes Symptom severity: mild  Duration of current treatment : not on anything Psychotherapy/counseling: no in the past Depressed mood: no Anxious mood: no Anhedonia: no Significant weight loss or gain: no Insomnia: no  Fatigue: yes Feelings of worthlessness or guilt: no Impaired concentration/indecisiveness: no Suicidal ideations: no Hopelessness: no Crying spells: no    12/21/2023    1:11 PM 11/26/2023    1:18 PM 08/25/2022    9:35 AM 07/21/2022    9:42 AM 07/18/2022    1:14 PM  Depression screen PHQ 2/9  Decreased Interest 0 0 0 0 0  Down, Depressed, Hopeless 0 0 0 0 0  PHQ - 2 Score 0 0 0 0 0  Altered sleeping 1 1 1 1 1   Tired, decreased energy 1 0 1 1 1   Change in appetite 0 0 0 0 0  Feeling bad or failure about yourself  0 0 0 0 0  Trouble concentrating 0 0 0 0 0  Moving slowly or fidgety/restless 0 0 0 0 0  Suicidal thoughts 0 0 0 0 0  PHQ-9 Score 2 1 2 2 2   Difficult doing work/chores  Not difficult at all Not difficult at all Not difficult at all Not difficult at all   Menopausal Symptoms: no  Depression Screen done today and results listed below:     12/21/2023    1:11 PM 11/26/2023    1:18 PM 08/25/2022    9:35 AM 07/21/2022    9:42 AM 07/18/2022    1:14 PM  Depression screen PHQ 2/9  Decreased Interest 0 0 0 0 0  Down, Depressed, Hopeless 0 0 0 0 0  PHQ - 2 Score 0 0 0 0 0   Altered sleeping 1 1 1 1 1   Tired, decreased energy 1 0 1 1 1   Change in appetite 0 0 0 0 0  Feeling bad or failure about yourself  0 0 0 0 0  Trouble concentrating 0 0 0 0 0  Moving slowly or fidgety/restless 0 0 0 0 0  Suicidal thoughts 0 0 0 0 0  PHQ-9 Score 2 1 2 2 2   Difficult doing work/chores  Not difficult at all Not difficult at all Not difficult at all Not difficult at all    Past Medical History:  Past Medical History:  Diagnosis Date   Anemia    Anxiety    Arthritis    lower back   Bipolar affective disorder (HCC)    Controlled substance agreement signed 10/16/2017   Deviated septum    Diabetes mellitus without complication (HCC)    GERD (gastroesophageal reflux disease)    Headache(784.0)    migraines - none 2 months   Low iron    Peptic ulcer    Pre-diabetes    Schizophrenia (HCC)    Sleep apnea  Substance abuse [facility])     Surgical History:  Past Surgical History:  Procedure Laterality Date   COLONOSCOPY WITH PROPOFOL N/A 01/14/2022   Procedure: COLONOSCOPY WITH PROPOFOL;  Surgeon: Wyline Mood, MD;  [facility];  Service: Gastroenterology;  Laterality: N/A;   ESOPHAGOGASTRODUODENOSCOPY  08/05/2012   Procedure: ESOPHAGOGASTRODUODENOSCOPY (EGD);  Surgeon: Kandis Cocking, MD;  Location: [facility] ENDOSCOPY;  Service: General;  Laterality: N/A;   ESOPHAGOGASTRODUODENOSCOPY N/A 01/14/2022   Procedure: ESOPHAGOGASTRODUODENOSCOPY (EGD);  Surgeon: Wyline Mood, MD;  Location: [facility] ENDOSCOPY;  Service: Gastroenterology;  Laterality: N/A;   ESOPHAGOGASTRODUODENOSCOPY (EGD) WITH PROPOFOL N/A 08/06/2016   Procedure: ESOPHAGOGASTRODUODENOSCOPY (EGD) WITH PROPOFOL;  Surgeon: Wyline Mood, MD;  [facility];  Service: Endoscopy;  Laterality: N/A;   ESOPHAGOGASTRODUODENOSCOPY (EGD) WITH PROPOFOL N/A 11/07/2016   Procedure: ESOPHAGOGASTRODUODENOSCOPY (EGD) WITH PROPOFOL;  Surgeon: Wyline Mood, MD;  Location: [facility] ENDOSCOPY;  Service: Endoscopy;  Laterality:  N/A;   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/2011   Procedure: LAPAROSCOPIC ROUX-EN-Y GASTRIC;  Surgeon: Mariella Saa, MD;  Location: [facility] ORS;  Service: General;  Laterality: N/A;  upper endoscopy   SEPTOPLASTY  2003    Medications:  Current Outpatient Medications on File Prior to Visit  Medication Sig   aspirin EC 81 MG tablet Take 1 tablet (81 mg total) by mouth daily. Swallow whole.   B-D 3CC LUER-LOK SYR 25GX1" 25G X 1" 3 ML MISC    lidocaine (LIDODERM) 5 % Place 1 patch onto the skin daily. Remove & Discard patch within 12 hours or as directed by MD   Probiotic Product ([employer] MOOD SUPPORT PROBIOTIC PO) Take by mouth daily.   SUMAtriptan (IMITREX) 100 MG tablet TAKE 1 TABLET (100 MG TOTAL) BY MOUTH ONCE FOR 1 DOSE. MAY REPEAT IN 2 HOURS IF HEADACHE PERSISTS OR RECURS. NOT TO EXCEED 2 IN 24 HOURS.   Vitamin D, Ergocalciferol, (DRISDOL) 1.25 MG (50000 UNIT) CAPS capsule Take 1 capsule (50,000 Units total) by mouth every 7 (seven) days.   No current facility-administered medications on file prior to visit.    Allergies:  Allergies  Allergen Reactions   Acetaminophen Nausea Only   Valproic Acid Other (See Comments)    Tremors   Depakote [Divalproex Sodium] Other (See Comments)    Tremors    Social History:  Social History   Socioeconomic History   Marital status: Divorced    Spouse name: Not on file   Number of children: Not on file   Years of education: Not on file   Highest education level: Associate degree: occupational, Scientist, product/process development, or vocational program  Occupational History   Occupation: unemployed  Tobacco Use   Smoking status: Former    Current packs/day: 0.00    Types: Cigarettes    Quit date: 04/03/2017    Years since quitting: 6.7   Smokeless tobacco: Never   Tobacco comments:    only smoked for about 6months   Vaping Use   Vaping status: Never Used  Substance and Sexual Activity   Alcohol use: Yes    Alcohol/week: 3.0 standard drinks of alcohol    Types: 3  Standard drinks or equivalent per week   Drug use: Not Currently    Types: Marijuana   Sexual activity: Yes    Birth control/protection: None  Other Topics Concern   Not on file  Social History Narrative   Not on file   Social Drivers of Health   Financial Resource Strain: Low Risk  (11/25/2023)   Overall Financial Resource Strain (CARDIA)  Difficulty of Paying Living Expenses: Not very hard  Food Insecurity: Food Insecurity Present (11/25/2023)   Hunger Vital Sign    Worried About Running Out of Food in the Last Year: Sometimes true    Ran Out of Food in the Last Year: Never true  Transportation Needs: No Transportation Needs (11/25/2023)   PRAPARE - Administrator, Civil Service (Medical): No    Lack of Transportation (Non-Medical): No  Physical Activity: Insufficiently Active (11/25/2023)   Exercise Vital Sign    Days of Exercise per Week: 4 days    Minutes of Exercise per Session: 30 min  Stress: No Stress Concern Present (11/25/2023)   [employer] of Occupational Health - Occupational Stress Questionnaire    Feeling of Stress : Only a little  Social Connections: Unknown (11/25/2023)   Social Connection and Isolation Panel [NHANES]    Frequency of Communication with Friends and Family: Twice a week    Frequency of Social Gatherings with Friends and Family: Not on file    Attends Religious Services: 1 to 4 times per year    Active Member of [employer] or Organizations: No    Attends Engineer, structural: Not on file    Marital Status: Divorced  Intimate Partner Violence: Not on file   Social History   Tobacco Use  Smoking Status Former   Current packs/day: 0.00   Types: Cigarettes   Quit date: 04/03/2017   Years since quitting: 6.7  Smokeless Tobacco Never  Tobacco Comments   only smoked for about 6months    Social History   Substance and Sexual Activity  Alcohol Use Yes   Alcohol/week: 3.0 standard drinks of alcohol   Types: 3 Standard  drinks or equivalent per week    Family History:  Family History  Problem Relation Age of Onset   Diabetes Mother    Cancer Mother 29       brain, skin   Cancer Father 76       kidney, bone, rectal   Hyperlipidemia Father    Hypertension Father    Stroke Father    Allergies Sister    Lupus Sister    Breast cancer Maternal Aunt 8   Cancer Paternal Aunt        breast   Diabetes Brother    Congenital heart disease Brother     Past medical history, surgical history, medications, allergies, family history and social history reviewed with patient today and changes made to appropriate areas of the chart.   Review of Systems  Constitutional: Negative.   HENT: Negative.    Eyes: Negative.   Respiratory: Negative.    Cardiovascular: Negative.   Gastrointestinal:  Positive for constipation and nausea. Negative for abdominal pain, blood in stool, diarrhea, heartburn, melena and vomiting.  Genitourinary: Negative.   Musculoskeletal:  Positive for joint pain. Negative for back pain, falls, myalgias and neck pain.       L elbow pain  Skin: Negative.   Neurological:  Positive for dizziness. Negative for tingling, tremors, sensory change, speech change, focal weakness, seizures, loss of consciousness, weakness and headaches.  Endo/Heme/Allergies:  Positive for environmental allergies. Negative for polydipsia. Does not bruise/bleed easily.  Psychiatric/Behavioral: Negative.     All other ROS negative except what is listed above and in the HPI.      Objective:    BP 96/65 (BP Location: Left Arm, Patient Position: Sitting, Cuff Size: Large)   Pulse 76   Ht 5' 2.21" (1.58  m)   Wt 146 lb 6.4 oz (66.4 kg)   LMP 11/12/2023 (Exact Date)   SpO2 99%   BMI 26.60 kg/m   Wt Readings from Last 3 Encounters:  12/21/23 146 lb 6.4 oz (66.4 kg)  11/26/23 141 lb 9.6 oz (64.2 kg)  01/18/23 130 lb (59 kg)    Physical Exam Vitals and nursing note reviewed.  Constitutional:      General: She is  not in acute distress.    Appearance: Normal appearance. She is normal weight. She is not ill-appearing, toxic-appearing or diaphoretic.  HENT:     Head: Normocephalic and atraumatic.     Right Ear: Tympanic membrane, ear canal and external ear normal. There is no impacted cerumen.     Left Ear: Tympanic membrane, ear canal and external ear normal. There is no impacted cerumen.     Nose: Nose normal. No congestion or rhinorrhea.     Mouth/Throat:     Mouth: Mucous membranes are moist.     Pharynx: Oropharynx is clear. No oropharyngeal exudate or posterior oropharyngeal erythema.  Eyes:     General: No scleral icterus.       Right eye: No discharge.        Left eye: No discharge.     Extraocular Movements: Extraocular movements intact.     Conjunctiva/sclera: Conjunctivae normal.     Pupils: Pupils are equal, round, and reactive to light.  Neck:     Vascular: No carotid bruit.  Cardiovascular:     Rate and Rhythm: Normal rate and regular rhythm.     Pulses: Normal pulses.     Heart sounds: No murmur heard.    No friction rub. No gallop.  Pulmonary:     Effort: Pulmonary effort is normal. No respiratory distress.     Breath sounds: Normal breath sounds. No stridor. No wheezing, rhonchi or rales.  Chest:     Chest wall: No tenderness.  Abdominal:     General: Abdomen is flat. Bowel sounds are normal. There is no distension.     Palpations: Abdomen is soft. There is no mass.     Tenderness: There is no abdominal tenderness. There is no right CVA tenderness, left CVA tenderness, guarding or rebound.     Hernia: No hernia is present.  Genitourinary:    Comments: Breast and pelvic exams deferred with shared decision making Musculoskeletal:        General: No swelling, tenderness, deformity or signs of injury.     Cervical back: Normal range of motion and neck supple. No rigidity. No muscular tenderness.     Right lower leg: No edema.     Left lower leg: No edema.  Lymphadenopathy:      Cervical: No cervical adenopathy.  Skin:    General: Skin is warm and dry.     Capillary Refill: Capillary refill takes less than 2 seconds.     Coloration: Skin is not jaundiced or pale.     Findings: No bruising, erythema, lesion or rash.  Neurological:     General: No focal deficit present.     Mental Status: She is alert and oriented to person, place, and time. Mental status is at baseline.     Cranial Nerves: No cranial nerve deficit.     Sensory: No sensory deficit.     Motor: No weakness.     Coordination: Coordination normal.     Gait: Gait normal.     Deep Tendon Reflexes: Reflexes normal.  Psychiatric:        Mood and Affect: Mood normal.        Behavior: Behavior normal.        Thought Content: Thought content normal.        Judgment: Judgment normal.     Results for orders placed or performed during the hospital encounter of 01/18/23  Urinalysis, Routine w reflex microscopic -Urine, Clean Catch   Collection Time: 01/18/23  8:57 PM  Result Value Ref Range   Color, Urine YELLOW YELLOW   APPearance CLEAR CLEAR   Specific Gravity, Urine 1.016 1.005 - 1.030   pH 5.0 5.0 - 8.0   Glucose, UA NEGATIVE NEGATIVE mg/dL   Hgb urine dipstick SMALL (A) NEGATIVE   Bilirubin Urine NEGATIVE NEGATIVE   Ketones, ur 5 (A) NEGATIVE mg/dL   Protein, ur NEGATIVE NEGATIVE mg/dL   Nitrite NEGATIVE NEGATIVE   Leukocytes,Ua NEGATIVE NEGATIVE   RBC / HPF 0-5 0 - 5 RBC/hpf   WBC, UA 0-5 0 - 5 WBC/hpf   Bacteria, UA NONE SEEN NONE SEEN   Squamous Epithelial / HPF 0-5 0 - 5 /HPF   Mucus PRESENT   CBG monitoring, ED   Collection Time: 01/18/23  9:05 PM  Result Value Ref Range   Glucose-Capillary 120 (H) 70 - 99 mg/dL  Basic metabolic panel   Collection Time: 01/18/23  9:11 PM  Result Value Ref Range   Sodium 137 135 - 145 mmol/L   Potassium 3.7 3.5 - 5.1 mmol/L   Chloride 106 98 - 111 mmol/L   CO2 19 (L) 22 - 32 mmol/L   Glucose, Bld 114 (H) 70 - 99 mg/dL   BUN 16 6 - 20 mg/dL    Creatinine, Ser 6.64 0.44 - 1.00 mg/dL   Calcium 9.0 8.9 - 40.3 mg/dL   GFR, Estimated >47 >42 mL/min   Anion gap 12 5 - 15  CBC   Collection Time: 01/18/23  9:11 PM  Result Value Ref Range   WBC 11.6 (H) 4.0 - 10.5 K/uL   RBC 3.78 (L) 3.87 - 5.11 MIL/uL   Hemoglobin 12.5 12.0 - 15.0 g/dL   HCT 59.5 63.8 - 75.6 %   MCV 95.2 80.0 - 100.0 fL   MCH 33.1 26.0 - 34.0 pg   MCHC 34.7 30.0 - 36.0 g/dL   RDW 43.3 29.5 - 18.8 %   Platelets 197 150 - 400 K/uL   nRBC 0.0 0.0 - 0.2 %  I-Stat beta hCG blood, ED   Collection Time: 01/18/23  9:15 PM  Result Value Ref Range   I-stat hCG, quantitative <5.0 <5 mIU/mL   Comment 3          CBG monitoring, ED   Collection Time: 01/19/23  1:30 AM  Result Value Ref Range   Glucose-Capillary 88 70 - 99 mg/dL      Assessment & Plan:   Problem List Items Addressed This Visit       Other   B12 deficiency   Rechecking labs today. Await results. Treat as needed.       Relevant Orders   B12   Bipolar 1 disorder, depressed (HCC)   Doing well not on medicine. Continue to monitor. Call with any concerns.       IDA (iron deficiency anemia)   Rechecking labs today. Await results. Treat as needed.       Relevant Medications   cyanocobalamin (VITAMIN B12) 1000 MCG/ML injection   Other Relevant Orders   Ferritin  Iron Binding Cap (TIBC)([facility])   Vitamin D deficiency   Rechecking labs today. Await results. Treat as needed.       Relevant Orders   VITAMIN D 25 Hydroxy (Vit-D Deficiency, Fractures)   RLS (restless legs syndrome)   Under good control on current regimen. Continue current regimen. Continue to monitor. Call with any concerns. Refills given. Labs drawn today.        Other Visit Diagnoses       Routine general medical examination at a health care facility    -  Primary   Vaccines up to date. Screening labs checked today. Pap and colonoscopy up to date. Mammogram ordered today. Continue diet and exercise. Call with  concerns.   Relevant Orders   CBC with Differential/Platelet   Comprehensive metabolic panel   Lipid Panel w/o Chol/HDL Ratio   TSH     Infertility counseling       Has a new partner and has been trying for 9 months. Given patient's age, will check labs and refer to reproductive endocrinology.   Relevant Orders   LH   Estradiol   Anti mullerian hormone   FSH   Ambulatory referral to Endocrinology     Encounter for screening mammogram for malignant neoplasm of breast       Mammogram ordered today.   Relevant Orders   MM 3D SCREENING MAMMOGRAM BILATERAL BREAST        Follow up plan: Return in about 6 months (around 06/22/2024).   LABORATORY TESTING:  - Pap smear: up to date  IMMUNIZATIONS:   - Tdap: Tetanus vaccination status reviewed: last tetanus booster within 10 years. - Influenza: Refused - Pneumovax: Refused - Prevnar: Refused - COVID: Refused - HPV: Not applicable - Shingrix vaccine: Not applicable  SCREENING: -Mammogram: Ordered today  - Colonoscopy: Up to date   PATIENT COUNSELING:   Advised to take 1 mg of folate supplement per day if capable of pregnancy.   Sexuality: Discussed sexually transmitted diseases, partner selection, use of condoms, avoidance of unintended pregnancy  and contraceptive alternatives.   Advised to avoid cigarette smoking.  I discussed with the patient that most people either abstain from alcohol or drink within safe limits (<=14/week and <=4 drinks/occasion for males, <=7/weeks and <= 3 drinks/occasion for females) and that the risk for alcohol disorders and other health effects rises proportionally with the number of drinks per week and how often a drinker exceeds daily limits.  Discussed cessation/primary prevention of drug use and availability of treatment for abuse.   Diet: Encouraged to adjust caloric intake to maintain  or achieve ideal body weight, to reduce intake of dietary saturated fat and total fat, to limit sodium  intake by avoiding high sodium foods and not adding table salt, and to maintain adequate dietary potassium and calcium preferably from fresh fruits, vegetables, and low-fat dairy products.    stressed the importance of regular exercise  Injury prevention: Discussed safety belts, safety helmets, smoke detector, smoking near bedding or upholstery.   Dental health: Discussed importance of regular tooth brushing, flossing, and dental visits.    NEXT PREVENTATIVE PHYSICAL DUE IN 1 YEAR. Return in about 6 months (around 06/22/2024).

## 2023-12-22 ENCOUNTER — Encounter: Payer: Self-pay | Admitting: Family Medicine

## 2023-12-22 LAB — CBC WITH DIFFERENTIAL/PLATELET
Basophils Absolute: 0.1 10*3/uL (ref 0.0–0.2)
Basos: 1 %
EOS (ABSOLUTE): 0.1 10*3/uL (ref 0.0–0.4)
Eos: 2 %
Hematocrit: 38.4 % (ref 34.0–46.6)
Hemoglobin: 12.6 g/dL (ref 11.1–15.9)
Immature Grans (Abs): 0 10*3/uL (ref 0.0–0.1)
Immature Granulocytes: 0 %
Lymphocytes Absolute: 2.1 10*3/uL (ref 0.7–3.1)
Lymphs: 30 %
MCH: 31.7 pg (ref 26.6–33.0)
MCHC: 32.8 g/dL (ref 31.5–35.7)
MCV: 97 fL (ref 79–97)
Monocytes Absolute: 0.5 10*3/uL (ref 0.1–0.9)
Monocytes: 8 %
Neutrophils Absolute: 4.2 10*3/uL (ref 1.4–7.0)
Neutrophils: 59 %
Platelets: 244 10*3/uL (ref 150–450)
RBC: 3.97 x10E6/uL (ref 3.77–5.28)
RDW: 12.2 % (ref 11.7–15.4)
WBC: 7 10*3/uL (ref 3.4–10.8)

## 2023-12-23 ENCOUNTER — Telehealth: Payer: Self-pay

## 2023-12-23 NOTE — Telephone Encounter (Signed)
 Copied from CRM [id number]. Topic: Referral - Status >> Dec 23, 2023 10:55 AM Geroge Baseman wrote: Reason for CRM: [facility] states referral sent to Endocrinology was closed as they do not provide care for infertility. They suggested to refer patient to an OB GYN for this type of issue.

## 2023-12-23 NOTE — Telephone Encounter (Signed)
 Routing to provider. Can we refer patient to OB instead?

## 2023-12-24 NOTE — Telephone Encounter (Signed)
 Referral was supposed to be sent to reproductive endocrinology. Can we adjust this please

## 2023-12-27 LAB — IRON AND TIBC
Iron Saturation: 57 % — ABNORMAL HIGH (ref 15–55)
Iron: 226 ug/dL — ABNORMAL HIGH (ref 27–159)
Total Iron Binding Capacity: 399 ug/dL (ref 250–450)
UIBC: 173 ug/dL (ref 131–425)

## 2023-12-27 LAB — FERRITIN: Ferritin: 32 ng/mL (ref 15–150)

## 2023-12-27 LAB — COMPREHENSIVE METABOLIC PANEL
ALT: 23 IU/L (ref 0–32)
AST: 22 IU/L (ref 0–40)
Albumin: 4.5 g/dL (ref 3.9–4.9)
Alkaline Phosphatase: 61 IU/L (ref 44–121)
BUN/Creatinine Ratio: 19 (ref 9–23)
BUN: 15 mg/dL (ref 6–24)
Bilirubin Total: 0.4 mg/dL (ref 0.0–1.2)
CO2: 24 mmol/L (ref 20–29)
Calcium: 9.3 mg/dL (ref 8.7–10.2)
Chloride: 103 mmol/L (ref 96–106)
Creatinine, Ser: 0.81 mg/dL (ref 0.57–1.00)
Globulin, Total: 1.9 g/dL (ref 1.5–4.5)
Glucose: 77 mg/dL (ref 70–99)
Potassium: 4.5 mmol/L (ref 3.5–5.2)
Sodium: 140 mmol/L (ref 134–144)
Total Protein: 6.4 g/dL (ref 6.0–8.5)
eGFR: 89 mL/min/{1.73_m2} (ref >59)

## 2023-12-27 LAB — ANTI MULLERIAN HORMONE: ANTI-MULLERIAN HORMONE (AMH): 0.263 ng/mL

## 2023-12-27 LAB — TSH: TSH: 1.46 u[IU]/mL (ref 0.450–4.500)

## 2023-12-27 LAB — LIPID PANEL W/O CHOL/HDL RATIO
Cholesterol, Total: 154 mg/dL (ref 100–199)
HDL: 88 mg/dL (ref >39)
LDL Chol Calc (NIH): 56 mg/dL (ref 0–99)
Triglycerides: 46 mg/dL (ref 0–149)
VLDL Cholesterol Cal: 10 mg/dL (ref 5–40)

## 2023-12-27 LAB — VITAMIN D 25 HYDROXY (VIT D DEFICIENCY, FRACTURES): Vit D, 25-Hydroxy: 44.6 ng/mL (ref 30.0–100.0)

## 2023-12-27 LAB — ESTRADIOL: Estradiol: 132 pg/mL

## 2023-12-27 LAB — LUTEINIZING HORMONE: LH: 8.7 m[IU]/mL

## 2023-12-27 LAB — VITAMIN B12: Vitamin B-12: 795 pg/mL (ref 232–1245)

## 2023-12-27 LAB — FOLLICLE STIMULATING HORMONE: FSH: 11.2 m[IU]/mL

## 2024-01-05 ENCOUNTER — Telehealth: Payer: Self-pay

## 2024-01-05 NOTE — Telephone Encounter (Signed)
 Routing to referral team. Can we reroute this referral please?

## 2024-01-05 NOTE — Telephone Encounter (Signed)
 Copied from CRM [id number]. Topic: Referral - Status >> Jan 04, 2024  4:29 PM Patsy Lager T wrote: Reason for CRM: Tamika from [facility] called stated they are not able to accept patient as they do not do infertility counseling.

## 2024-05-06 ENCOUNTER — Other Ambulatory Visit: Payer: Self-pay

## 2024-05-06 ENCOUNTER — Encounter: Payer: Self-pay | Admitting: Family Medicine

## 2024-05-09 MED ORDER — ONDANSETRON HCL 4 MG PO TABS: 4.0000 mg | ORAL_TABLET | Freq: Three times a day (TID) | ORAL | 0 refills | Status: DC | PRN

## 2024-06-24 ENCOUNTER — Telehealth: Payer: Self-pay

## 2024-06-24 NOTE — Telephone Encounter (Signed)
 Can this be done for the patient or does she need to wait until appointment? If can be done, please place future lab orders.

## 2024-06-24 NOTE — Telephone Encounter (Signed)
 Copied from CRM [id number]. Topic: Clinical - Request for Lab/Test Order >> Jun 24, 2024  7:56 AM Belinda [name] wrote: Reason for CRM: Patient is requesting lab work before 09/25/ appt. Please contact for scheduling.

## 2024-06-30 ENCOUNTER — Ambulatory Visit: Admitting: Family Medicine

## 2024-06-30 VITALS — BP 113/76 | HR 59 | Ht 62.0 in | Wt 154.0 lb

## 2024-06-30 DIAGNOSIS — M25552 Pain in left hip: Secondary | ICD-10-CM | POA: Diagnosis not present

## 2024-06-30 DIAGNOSIS — R5382 Chronic fatigue, unspecified: Secondary | ICD-10-CM

## 2024-06-30 DIAGNOSIS — Z789 Other specified health status: Secondary | ICD-10-CM | POA: Diagnosis not present

## 2024-06-30 MED ORDER — TRAZODONE HCL 50 MG PO TABS: 25.0000 mg | ORAL_TABLET | Freq: Every evening | ORAL | 3 refills | Status: DC | PRN

## 2024-06-30 NOTE — Progress Notes (Signed)
 BP 113/76 (BP Location: Left Arm, Patient Position: Sitting, Cuff Size: Normal)   Pulse (!) 59   Ht 5' 2 (1.575 m)   Wt 154 lb (69.9 kg)   SpO2 100%   BMI 28.17 kg/m    Subjective:    Patient ID: Belinda [name], female    DOB: Mar 04, 1976, 48 y.o.   MRN: [id number]  HPI: Belinda [name] is a 48 y.o. female  Chief Complaint  Patient presents with   Fatigue    Onset for about a few weeks. Trouble sleeping. 11pm-3am.  Under some stress but nothing new.    Eczema    Noticed about a week ago. Dry skin. Has been told she looks pale. Not normal.    Tachycardia    Twice last week. Hx of panic attacks however pt states that this wasn't that. Denies sob. Did hx cp for a few mins and tightness. Lasted 2 mins. Random.    FATIGUE Duration:  about a month Severity: severe  Onset: sudden Context when symptoms started:  unknown Symptoms improve with rest: no  Depressive symptoms: no Stress/anxiety: no Insomnia: yes hard to fall asleep and stay asleep Snoring: no Observed apnea by bed partner: no Daytime hypersomnolence:no Wakes feeling refreshed: no History of sleep study: no Dysnea on exertion:  no Orthopnea/PND: no Chest pain: yes Chronic cough: no Lower extremity edema: no Arthralgias:no Myalgias: no Weakness: yes Rash: no   Relevant past medical, surgical, family and social history reviewed and updated as indicated. Interim medical history since our last visit reviewed. Allergies and medications reviewed and updated.  Review of Systems  Constitutional:  Positive for fatigue. Negative for activity change, appetite change, chills, diaphoresis, fever and unexpected weight change.  Respiratory: Negative.    Cardiovascular:  Positive for palpitations. Negative for chest pain and leg swelling.  Musculoskeletal: Negative.   Neurological:  Positive for dizziness, weakness and light-headedness. Negative for tremors, seizures, syncope, facial asymmetry, speech difficulty,  numbness and headaches.  Psychiatric/Behavioral:  Positive for sleep disturbance. Negative for agitation, behavioral problems, confusion, decreased concentration, dysphoric mood, hallucinations, self-injury and suicidal ideas. The patient is not nervous/anxious and is not hyperactive.     Per HPI unless specifically indicated above     Objective:    BP 113/76 (BP Location: Left Arm, Patient Position: Sitting, Cuff Size: Normal)   Pulse (!) 59   Ht 5' 2 (1.575 m)   Wt 154 lb (69.9 kg)   SpO2 100%   BMI 28.17 kg/m   Wt Readings from Last 3 Encounters:  06/30/24 154 lb (69.9 kg)  12/21/23 146 lb 6.4 oz (66.4 kg)  11/26/23 141 lb 9.6 oz (64.2 kg)    Physical Exam Vitals and nursing note reviewed.  Constitutional:      General: She is not in acute distress.    Appearance: Normal appearance. She is not ill-appearing, toxic-appearing or diaphoretic.  HENT:     Head: Normocephalic and atraumatic.     Right Ear: External ear normal.     Left Ear: External ear normal.     Nose: Nose normal.     Mouth/Throat:     Mouth: Mucous membranes are moist.     Pharynx: Oropharynx is clear.  Eyes:     General: No scleral icterus.       Right eye: No discharge.        Left eye: No discharge.     Extraocular Movements: Extraocular movements intact.     Conjunctiva/sclera: Conjunctivae  normal.     Pupils: Pupils are equal, round, and reactive to light.  Cardiovascular:     Rate and Rhythm: Normal rate and regular rhythm.     Pulses: Normal pulses.     Heart sounds: Normal heart sounds. No murmur heard.    No friction rub. No gallop.  Pulmonary:     Effort: Pulmonary effort is normal. No respiratory distress.     Breath sounds: Normal breath sounds. No stridor. No wheezing, rhonchi or rales.  Chest:     Chest wall: No tenderness.  Musculoskeletal:        General: Normal range of motion.     Cervical back: Normal range of motion and neck supple.  Skin:    General: Skin is warm and dry.      Capillary Refill: Capillary refill takes less than 2 seconds.     Coloration: Skin is not jaundiced or pale.     Findings: No bruising, erythema, lesion or rash.  Neurological:     General: No focal deficit present.     Mental Status: She is alert and oriented to person, place, and time. Mental status is at baseline.  Psychiatric:        Mood and Affect: Mood normal.        Behavior: Behavior normal.        Thought Content: Thought content normal.        Judgment: Judgment normal.     Results for orders placed or performed in visit on 12/21/23  CBC with Differential/Platelet   Collection Time: 12/21/23  1:26 PM  Result Value Ref Range   WBC 7.0 3.4 - 10.8 x10E3/uL   RBC 3.97 3.77 - 5.28 x10E6/uL   Hemoglobin 12.6 11.1 - 15.9 g/dL   Hematocrit 61.5 65.9 - 46.6 %   MCV 97 79 - 97 fL   MCH 31.7 26.6 - 33.0 pg   MCHC 32.8 31.5 - 35.7 g/dL   RDW 87.7 88.2 - 84.5 %   Platelets 244 150 - 450 x10E3/uL   Neutrophils 59 Not Estab. %   Lymphs 30 Not Estab. %   Monocytes 8 Not Estab. %   Eos 2 Not Estab. %   Basos 1 Not Estab. %   Neutrophils Absolute 4.2 1.4 - 7.0 x10E3/uL   Lymphocytes Absolute 2.1 0.7 - 3.1 x10E3/uL   Monocytes Absolute 0.5 0.1 - 0.9 x10E3/uL   EOS (ABSOLUTE) 0.1 0.0 - 0.4 x10E3/uL   Basophils Absolute 0.1 0.0 - 0.2 x10E3/uL   Immature Granulocytes 0 Not Estab. %   Immature Grans (Abs) 0.0 0.0 - 0.1 x10E3/uL  Comprehensive metabolic panel   Collection Time: 12/21/23  1:28 PM  Result Value Ref Range   Glucose 77 70 - 99 mg/dL   BUN 15 6 - 24 mg/dL   Creatinine, Ser 9.18 0.57 - 1.00 mg/dL   eGFR 89 >40 fO/fpw/8.26   BUN/Creatinine Ratio 19 9 - 23   Sodium 140 134 - 144 mmol/L   Potassium 4.5 3.5 - 5.2 mmol/L   Chloride 103 96 - 106 mmol/L   CO2 24 20 - 29 mmol/L   Calcium 9.3 8.7 - 10.2 mg/dL   Total Protein 6.4 6.0 - 8.5 g/dL   Albumin 4.5 3.9 - 4.9 g/dL   Globulin, Total 1.9 1.5 - 4.5 g/dL   Bilirubin Total 0.4 0.0 - 1.2 mg/dL   Alkaline  Phosphatase 61 44 - 121 IU/L   AST 22 0 - 40 IU/L   ALT  23 0 - 32 IU/L  Lipid Panel w/o Chol/HDL Ratio   Collection Time: 12/21/23  1:28 PM  Result Value Ref Range   Cholesterol, Total 154 100 - 199 mg/dL   Triglycerides 46 0 - 149 mg/dL   HDL 88 >60 mg/dL   VLDL Cholesterol Cal 10 5 - 40 mg/dL   LDL Chol Calc (NIH) 56 0 - 99 mg/dL  TSH   Collection Time: 12/21/23  1:28 PM  Result Value Ref Range   TSH 1.460 0.450 - 4.500 uIU/mL  VITAMIN D  25 Hydroxy (Vit-D Deficiency, Fractures)   Collection Time: 12/21/23  1:28 PM  Result Value Ref Range   Vit D, 25-Hydroxy 44.6 30.0 - 100.0 ng/mL  B12   Collection Time: 12/21/23  1:28 PM  Result Value Ref Range   Vitamin B-12 795 232 - 1,245 pg/mL  Ferritin   Collection Time: 12/21/23  1:28 PM  Result Value Ref Range   Ferritin 32 15 - 150 ng/mL  Iron  Binding Cap (TIBC)([facility])   Collection Time: 12/21/23  1:28 PM  Result Value Ref Range   Total Iron  Binding Capacity 399 250 - 450 ug/dL   UIBC 826 868 - 574 ug/dL   Iron  226 (H) 27 - 159 ug/dL   Iron  Saturation 57 (H) 15 - 55 %  LH   Collection Time: 12/21/23  1:28 PM  Result Value Ref Range   LH 8.7 mIU/mL  Estradiol    Collection Time: 12/21/23  1:28 PM  Result Value Ref Range   Estradiol  132.0 pg/mL  Anti mullerian hormone   Collection Time: 12/21/23  1:28 PM  Result Value Ref Range   ANTI-MULLERIAN HORMONE ([facility]) 0.263 ng/mL  [facility]   Collection Time: 12/21/23  1:28 PM  Result Value Ref Range   FSH 11.2 mIU/mL      Assessment & Plan:   Problem List Items Addressed This Visit   None Visit Diagnoses       Chronic fatigue    -  Primary   Concern for anemia- will check labs and get her back in about 6 weeks. If normal consider sleep study. Call with any concerns.   Relevant Orders   VITAMIN D  25 Hydroxy (Vit-D Deficiency, Fractures)   Comprehensive metabolic panel with GFR   CBC with Differential/Platelet   TSH   B12   Ferritin   Iron  Binding Cap  (TIBC)([facility])   Lyme Disease Serology w/Reflex   Spotted Fever Group Antibodies     Left hip pain       Will get her into ortho. Await their input. Call with any concerns.   Relevant Orders   Ambulatory referral to Orthopedic Surgery     Hepatitis B vaccination status unknown       Will check labs today. Await results. Treat as needed.   Relevant Orders   Hepatitis B surface antibody,quantitative        Follow up plan: Return in about 6 weeks (around 08/11/2024).

## 2024-07-01 ENCOUNTER — Ambulatory Visit: Payer: Self-pay | Admitting: Family Medicine

## 2024-07-04 LAB — COMPREHENSIVE METABOLIC PANEL WITH GFR
ALT: 13 IU/L (ref 0–32)
AST: 17 IU/L (ref 0–40)
Albumin: 4.5 g/dL (ref 3.9–4.9)
Alkaline Phosphatase: 58 IU/L (ref 41–116)
BUN/Creatinine Ratio: 11 (ref 9–23)
BUN: 9 mg/dL (ref 6–24)
Bilirubin Total: 0.3 mg/dL (ref 0.0–1.2)
CO2: 22 mmol/L (ref 20–29)
Calcium: 9.2 mg/dL (ref 8.7–10.2)
Chloride: 103 mmol/L (ref 96–106)
Creatinine, Ser: 0.81 mg/dL (ref 0.57–1.00)
Globulin, Total: 2 g/dL (ref 1.5–4.5)
Glucose: 91 mg/dL (ref 70–99)
Potassium: 4.3 mmol/L (ref 3.5–5.2)
Sodium: 138 mmol/L (ref 134–144)
Total Protein: 6.5 g/dL (ref 6.0–8.5)
eGFR: 89 mL/min/1.73 (ref >59)

## 2024-07-04 LAB — CBC WITH DIFFERENTIAL/PLATELET
Basophils Absolute: 0.1 x10E3/uL (ref 0.0–0.2)
Basos: 1 %
EOS (ABSOLUTE): 0.2 x10E3/uL (ref 0.0–0.4)
Eos: 2 %
Hematocrit: 43.3 % (ref 34.0–46.6)
Hemoglobin: 13.6 g/dL (ref 11.1–15.9)
Immature Grans (Abs): 0 x10E3/uL (ref 0.0–0.1)
Immature Granulocytes: 0 %
Lymphocytes Absolute: 1.9 x10E3/uL (ref 0.7–3.1)
Lymphs: 29 %
MCH: 31.3 pg (ref 26.6–33.0)
MCHC: 31.4 g/dL — ABNORMAL LOW (ref 31.5–35.7)
MCV: 100 fL — ABNORMAL HIGH (ref 79–97)
Monocytes Absolute: 0.4 x10E3/uL (ref 0.1–0.9)
Monocytes: 7 %
Neutrophils Absolute: 4 x10E3/uL (ref 1.4–7.0)
Neutrophils: 61 %
Platelets: 242 x10E3/uL (ref 150–450)
RBC: 4.34 x10E6/uL (ref 3.77–5.28)
RDW: 12.5 % (ref 11.7–15.4)
WBC: 6.5 x10E3/uL (ref 3.4–10.8)

## 2024-07-04 LAB — VITAMIN D 25 HYDROXY (VIT D DEFICIENCY, FRACTURES): Vit D, 25-Hydroxy: 89.9 ng/mL (ref 30.0–100.0)

## 2024-07-04 LAB — SPOTTED FEVER GROUP ANTIBODIES
Spotted Fever Group IgG: <1:64 {titer}
Spotted Fever Group IgM: <1:64 {titer}

## 2024-07-04 LAB — HEPATITIS B SURFACE ANTIBODY, QUANTITATIVE: Hepatitis B Surf Ab Quant: 32.1 m[IU]/mL

## 2024-07-04 LAB — TSH: TSH: 1.66 u[IU]/mL (ref 0.450–4.500)

## 2024-07-04 LAB — VITAMIN B12: Vitamin B-12: 634 pg/mL (ref 232–1245)

## 2024-07-04 LAB — IRON AND TIBC
Iron Saturation: 17 % (ref 15–55)
Iron: 73 ug/dL (ref 27–159)
Total Iron Binding Capacity: 431 ug/dL (ref 250–450)
UIBC: 358 ug/dL (ref 131–425)

## 2024-07-04 LAB — LYME DISEASE SEROLOGY W/REFLEX: Lyme Total Antibody EIA: NEGATIVE

## 2024-07-04 LAB — FERRITIN: Ferritin: 19 ng/mL (ref 15–150)

## 2024-07-12 ENCOUNTER — Telehealth: Payer: Self-pay

## 2024-07-12 NOTE — Telephone Encounter (Signed)
 Copied from CRM [id number]. Topic: Referral - Request for Referral >> Jul 12, 2024  8:09 AM Carlatta H wrote: Did the patient discuss referral with their provider in the last year? Yes (If No - schedule appointment) (If Yes - send message)  Appointment offered? No  Type of order/referral and detailed reason for visit: endocrinology//Low blood sugar  Preference of office, provider, location: endocrinology no preferred location  If referral order, have you been seen by this specialty before? No (If Yes, this issue or another issue? When? Where?  Can we respond through MyChart? Yes

## 2024-07-12 NOTE — Telephone Encounter (Signed)
 Can this referral be entered for the patient?

## 2024-07-12 NOTE — Telephone Encounter (Signed)
 Unfortunately there is not a treatment for low blood sugar besides eating more regularly. We can talk about getting her a continuous glucose monitor which the insurance may or may not pay for, but I don't think a referral to endocrinology would be appropriate for this.

## 2024-07-24 ENCOUNTER — Other Ambulatory Visit: Payer: Self-pay | Admitting: Family Medicine

## 2024-07-26 NOTE — Telephone Encounter (Signed)
 Requested medication (s) are due for refill today: na  Requested medication (s) are on the active medication list: yes  Last refill:  06/30/24 #30 3 refills  Future visit scheduled: yes 08/11/24  Notes to clinic:  Pharmacy comment: REQUEST FOR 90 DAYS PRESCRIPTION.  Do you want to refill for 90 day supply?     Requested Prescriptions  Pending Prescriptions Disp Refills   traZODone  (DESYREL ) 50 MG tablet [Pharmacy Med Name: TRAZODONE  50 MG TABLET] 90 tablet 2    Sig: TAKE 0.5-1 TABLETS BY MOUTH AT BEDTIME AS NEEDED FOR SLEEP.     Psychiatry: Antidepressants - Serotonin Modulator Passed - 07/26/2024  1:55 PM      Passed - Valid encounter within last 6 months    Recent Outpatient Visits           3 weeks ago Chronic fatigue   [facility] [place], Megan P, DO   7 months ago Routine general medical examination at a health care facility   [facility], [place] P, DO   8 months ago Left hip pain   [place] [facility] [place], [place], DO

## 2024-07-26 NOTE — Telephone Encounter (Signed)
Can this be changed to a 90 day supply?

## 2024-08-03 ENCOUNTER — Encounter: Payer: Self-pay | Admitting: Family Medicine

## 2024-08-08 ENCOUNTER — Encounter: Payer: Self-pay | Admitting: Orthopaedic Surgery

## 2024-08-08 ENCOUNTER — Encounter: Payer: Self-pay | Admitting: Oncology

## 2024-08-08 ENCOUNTER — Encounter: Payer: Self-pay | Admitting: Radiology

## 2024-08-08 ENCOUNTER — Ambulatory Visit: Admitting: Orthopaedic Surgery

## 2024-08-08 ENCOUNTER — Other Ambulatory Visit (INDEPENDENT_AMBULATORY_CARE_PROVIDER_SITE_OTHER): Payer: Self-pay

## 2024-08-08 DIAGNOSIS — M25552 Pain in left hip: Secondary | ICD-10-CM

## 2024-08-08 MED ORDER — CELECOXIB 200 MG PO CAPS: 200.0000 mg | ORAL_CAPSULE | Freq: Two times a day (BID) | ORAL | 1 refills | Status: AC | PRN

## 2024-08-08 NOTE — Progress Notes (Signed)
 The patient is an active 48 year old female has been dealing with left hip pain for well over a year now.  She was told that she has a bone spur on the lateral side of her hip and she says she wants it taken care of.  She has been to physical therapy but not true physical therapy in terms of dry needling or other modalities.  It is mainly on the lateral side aspect of her hip where she hurts and she denies any groin pain.  She does not walk with a limp.  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  She cannot sleep on that side at night due to significance of left hip pain.  On exam her left hip moves smoothly and fluidly with no blocks or rotation and she is extensively tender over the trochanteric area of her left hip.  Her right hip exam is normal.  Her leg lengths are equal.    An AP pelvis and lateral left hip is unremarkable.  I do not see any significant cortical irregularities around the trochanteric area of either hip and the hip joint space is well-maintained.  I would like to send her to physical therapy for any modalities such as dry needling to hopefully help decrease her pain.  I have shown her some stretching exercises and have recommended Voltaren gel.  We will also obtain an MRI of her left hip since this is been hurting for now well over 18 months or more and getting worse so we can assess the gluteus medius and minimus tendon or any other pathology that is causing significant pain of her left hip trochanteric area.  We will see her back in 4 weeks from now after she has been through some therapy and have the MRI.

## 2024-08-09 ENCOUNTER — Other Ambulatory Visit: Payer: Self-pay

## 2024-08-09 DIAGNOSIS — M25552 Pain in left hip: Secondary | ICD-10-CM

## 2024-08-11 ENCOUNTER — Ambulatory Visit: Admitting: Family Medicine

## 2024-08-15 ENCOUNTER — Ambulatory Visit (INDEPENDENT_AMBULATORY_CARE_PROVIDER_SITE_OTHER)

## 2024-08-15 ENCOUNTER — Ambulatory Visit
Admission: EM | Admit: 2024-08-15 | Discharge: 2024-08-15 | Disposition: A | Discharge status: Home / Self Care | Attending: Emergency Medicine | Admitting: Emergency Medicine

## 2024-08-15 DIAGNOSIS — R052 Subacute cough: Secondary | ICD-10-CM

## 2024-08-15 DIAGNOSIS — J22 Unspecified acute lower respiratory infection: Secondary | ICD-10-CM | POA: Diagnosis not present

## 2024-08-15 MED ORDER — AZITHROMYCIN 250 MG PO TABS: 250.0000 mg | ORAL_TABLET | Freq: Every day | ORAL | 0 refills | Status: DC

## 2024-08-15 MED ORDER — BENZONATATE 100 MG PO CAPS: 200.0000 mg | ORAL_CAPSULE | Freq: Three times a day (TID) | ORAL | 0 refills | Status: DC

## 2024-08-15 MED ORDER — PROMETHAZINE-DM 6.25-15 MG/5ML PO SYRP: 5.0000 mL | ORAL_SOLUTION | Freq: Four times a day (QID) | ORAL | 0 refills | Status: DC | PRN

## 2024-08-15 NOTE — ED Triage Notes (Signed)
 Pt c/o cough & chest congestion off/on x3 wks. Has tried OTC meds w/o relief.

## 2024-08-15 NOTE — ED Provider Notes (Addendum)
 [facility]    CSN: [id number] Arrival date & time: 08/15/24  1716      History   Chief Complaint Chief Complaint  Patient presents with   Cough    HPI Belinda [name] is a 48 y.o. female.   HPI  48 year old female with past medical history significant for anemia, anxiety, arthritis, bipolar affective disorder, GERD, diabetes, left carotid aneurysm, restless leg syndrome, iron  deficiency anemia, status post gastric bypass presents for evaluation of 3 weeks worth of cough and chest congestion.  She reports that she is producing sputum mostly in the morning that is a thick white in color.  No fever.  Past Medical History:  Diagnosis Date   Anemia    Anxiety    Arthritis    lower back   Bipolar affective disorder (HCC)    Controlled substance agreement signed 10/16/2017   Deviated septum    Diabetes mellitus without complication (HCC)    GERD (gastroesophageal reflux disease)    Headache(784.0)    migraines - none 2 months   Low iron     Peptic ulcer    Pre-diabetes    Schizophrenia (HCC)    Sleep apnea    Substance abuse (HCC)     Patient Active Problem List   Diagnosis Date Noted   RLS (restless legs syndrome) 08/25/2022   Non-seasonal allergic rhinitis 08/25/2022   GERD (gastroesophageal reflux disease) 02/20/2022   Carotid aneurysm, left 02/19/2022   Preoperative evaluation to rule out surgical contraindication 02/19/2022   S/P gastric bypass 02/19/2022   Vitamin D  deficiency 12/27/2021   Dizziness 11/22/2021   Rash due to allergy 10/17/2020   IDA (iron  deficiency anemia) 04/04/2019   Bipolar 1 disorder, depressed (HCC) 03/22/2018   Sedative overdose 03/22/2018   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/04/2017   Arthropathy of lumbar facet joint 05/28/2016   B12 deficiency 02/13/2016   Bariatric surgery status 02/23/2014   Acute nonintractable headache 02/23/2014   Type 2 diabetes mellitus without complication, without long-term current use of insulin  (HCC) 02/21/2014    Past Surgical History:  Procedure Laterality Date   COLONOSCOPY WITH PROPOFOL  N/A 01/14/2022   Procedure: COLONOSCOPY WITH PROPOFOL ;  Surgeon: Therisa Bi, MD;  Location: [facility] ENDOSCOPY;  Service: Gastroenterology;  Laterality: N/A;   ESOPHAGOGASTRODUODENOSCOPY  08/05/2012   Procedure: ESOPHAGOGASTRODUODENOSCOPY (EGD);  Surgeon: Alm VEAR Angle, MD;  Location: [facility];  Service: General;  Laterality: N/A;   ESOPHAGOGASTRODUODENOSCOPY N/A 01/14/2022   Procedure: ESOPHAGOGASTRODUODENOSCOPY (EGD);  Surgeon: Therisa Bi, MD;  Location: [facility] ENDOSCOPY;  Service: Gastroenterology;  Laterality: N/A;   ESOPHAGOGASTRODUODENOSCOPY (EGD) WITH PROPOFOL  N/A 08/06/2016   Procedure: ESOPHAGOGASTRODUODENOSCOPY (EGD) WITH PROPOFOL ;  Surgeon: Bi Therisa, MD;  Location: [facility];  Service: Endoscopy;  Laterality: N/A;   ESOPHAGOGASTRODUODENOSCOPY (EGD) WITH PROPOFOL  N/A 11/07/2016   Procedure: ESOPHAGOGASTRODUODENOSCOPY (EGD) WITH PROPOFOL ;  Surgeon: Bi Therisa, MD;  Location: [facility] ENDOSCOPY;  Service: Endoscopy;  Laterality: N/A;   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/2011   Procedure: LAPAROSCOPIC ROUX-EN-Y GASTRIC;  Surgeon: Morene ONEIDA Olives, MD;  Location: [facility] ORS;  Service: General;  Laterality: N/A;  upper endoscopy   SEPTOPLASTY  2003    OB History   No obstetric history on file.      Home Medications    Prior to Admission medications   Medication Sig Start Date End Date Taking? Authorizing Provider  azithromycin  (ZITHROMAX  Z-PAK) 250 MG tablet Take 1 tablet (250 mg total) by mouth daily. Take 2 tablets on the first day and then 1  tablet daily thereafter for a total of 5 days of treatment. 08/15/24  Yes Bernardino Ditch, NP  benzonatate  (TESSALON ) 100 MG capsule Take 2 capsules (200 mg total) by mouth every 8 (eight) hours. 08/15/24  Yes Bernardino Ditch, NP  promethazine -dextromethorphan (PROMETHAZINE -DM) 6.25-15 MG/5ML syrup Take 5 mLs by mouth 4 (four) times daily as needed.  08/15/24  Yes Bernardino Ditch, NP  aspirin  EC 81 MG tablet Take 1 tablet (81 mg total) by mouth daily. Swallow whole. 05/13/22   Johnson, Megan P, DO  B-D 3CC LUER-LOK SYR 25GX1 25G X 1 3 ML MISC  02/27/16   [provider]  celecoxib  (CELEBREX ) 200 MG capsule Take 1 capsule (200 mg total) by mouth 2 (two) times daily between meals as needed. 08/08/24   Vernetta Lonni GRADE, MD  cyanocobalamin  (VITAMIN B12) 1000 MCG/ML injection Inject 1 mL (1,000 mcg total) into the muscle once a week. 12/21/23   Johnson, Megan P, DO  lidocaine  (LIDODERM ) 5 % Place 1 patch onto the skin daily. Remove & Discard patch within 12 hours or as directed by MD 10/14/17   Stuart Vernell Norris, PA-C  montelukast  (SINGULAIR ) 10 MG tablet Take 1 tablet (10 mg total) by mouth at bedtime. 12/21/23   Johnson, Megan P, DO  ondansetron  (ZOFRAN ) 4 MG tablet Take 1 tablet (4 mg total) by mouth every 8 (eight) hours as needed. for nausea 05/09/24   Cannady, Jolene T, NP  pramipexole  (MIRAPEX ) 0.125 MG tablet TAKE 1 TABLET BY MOUTH EVERY DAY AT BEDTIME IF TOLERATED INCREASE TO 2 TABS DAILY AFTER 1 WEEK 12/21/23   Johnson, Megan P, DO  Probiotic Product ([employer] MOOD SUPPORT PROBIOTIC PO) Take by mouth daily.    [provider]  SUMAtriptan  (IMITREX ) 100 MG tablet TAKE 1 TABLET (100 MG TOTAL) BY MOUTH ONCE FOR 1 DOSE. MAY REPEAT IN 2 HOURS IF HEADACHE PERSISTS OR RECURS. NOT TO EXCEED 2 IN 24 HOURS. 09/16/22 06/30/24  Vicci Bouchard P, DO  traZODone  (DESYREL ) 50 MG tablet Take 0.5-1 tablets (25-50 mg total) by mouth at bedtime as needed for sleep. 06/30/24   Johnson, Megan P, DO  Vitamin D , Ergocalciferol , (DRISDOL ) 1.25 MG (50000 UNIT) CAPS capsule Take 1 capsule (50,000 Units total) by mouth every 7 (seven) days. 11/25/21   Vicci Bouchard SQUIBB, DO    Family History Family History  Problem Relation Age of Onset   Diabetes Mother    Cancer Mother 26       brain, skin   Cancer Father 47       kidney, bone, rectal   Hyperlipidemia  Father    Hypertension Father    Stroke Father    Allergies Sister    Lupus Sister    Breast cancer Maternal Aunt 66   Cancer Paternal Aunt        breast   Diabetes Brother    Congenital heart disease Brother     Social History Social History   Tobacco Use   Smoking status: Former    Current packs/day: 0.00    Types: Cigarettes    Quit date: 04/03/2017    Years since quitting: 7.3   Smokeless tobacco: Never   Tobacco comments:    only smoked for about 6months   Vaping Use   Vaping status: Never Used  Substance Use Topics   Alcohol use: Yes    Alcohol/week: 3.0 standard drinks of alcohol    Types: 3 Standard drinks or equivalent per week   Drug use: Not Currently  Types: Marijuana     Allergies   Acetaminophen , Valproic acid , and Depakote  [divalproex  sodium]   Review of Systems Review of Systems  Constitutional:  Negative for fever.  Respiratory:  Positive for cough and wheezing. Negative for shortness of breath.      Physical Exam Triage Vital Signs ED Triage Vitals [08/15/24 1741]  Encounter Vitals Group     BP      Girls Systolic BP Percentile      Girls Diastolic BP Percentile      Boys Systolic BP Percentile      Boys Diastolic BP Percentile      Pulse      Resp      Temp      Temp src      SpO2      Weight 163 lb 1.6 oz (74 kg)     Height      Head Circumference      Peak Flow      Pain Score      Pain Loc      Pain Education      Exclude from Growth Chart    No data found.  Updated Vital Signs BP 122/78 (BP Location: Right Arm)   Pulse (!) 58   Temp 98.2 F (36.8 C) (Oral)   Resp 18   Wt 163 lb 1.6 oz (74 kg)   LMP 08/06/2024 (Exact Date)   SpO2 98%   BMI 29.83 kg/m   Visual Acuity Right Eye Distance:   Left Eye Distance:   Bilateral Distance:    Right Eye Near:   Left Eye Near:    Bilateral Near:     Physical Exam Vitals and nursing note reviewed.  Constitutional:      Appearance: Normal appearance. She is not  ill-appearing.  HENT:     Head: Normocephalic and atraumatic.  Cardiovascular:     Rate and Rhythm: Normal rate and regular rhythm.     Pulses: Normal pulses.     Heart sounds: Normal heart sounds. No murmur heard.    No friction rub. No gallop.  Pulmonary:     Effort: Pulmonary effort is normal.     Breath sounds: Normal breath sounds. No wheezing, rhonchi or rales.  Skin:    General: Skin is warm and dry.     Capillary Refill: Capillary refill takes less than 2 seconds.     Findings: No rash.  Neurological:     General: No focal deficit present.     Mental Status: She is alert and oriented to person, place, and time.      UC Treatments / Results  Labs (all labs ordered are listed, but only abnormal results are displayed) Labs Reviewed - No data to display  EKG   Radiology No results found.  Procedures Procedures (including critical care time)  Medications Ordered in UC Medications - No data to display  Initial Impression / Assessment and Plan / UC Course  I have reviewed the triage vital signs and the nursing notes.  Pertinent labs & imaging results that were available during my care of the patient were reviewed by me and considered in my medical decision making (see chart for details).   Patient is a pleasant, nontoxic-appearing 48 year old female presenting for evaluation of 3 weeks worth of respiratory complaints as outlined HPI above.  She is describing a cough that is productive for white sputum first thing in the morning.  Also wheezing.  No fever.  She is  able to speak in full sentences without dyspnea or tachypnea though talking does trigger a cough.  She has a very moist cough in triage but her lungs are clear to auscultation in all fields.  Her symptoms initially began after she had a cold.  She reports that the remainder the symptoms have resolved with exception of the cough.  I will obtain a chest x-ray to evaluate for any acute cardiopulmonary  pathology.  Chest x-ray independently reviewed and evaluated by me.  Impression: Lung fields are well aerated without evidence of infiltrate or effusion.  Cardiomediastinal silhouette appears normal.  Radiology overread is pending. Radiology impression states no active cardiopulmonary disease.  I will discharge patient home with diagnosis of lower respiratory tract infection.  Given that she has had symptoms for 3 weeks I do feel a trial of antibiotics is warranted.  I will discharge her home on azithromycin  once daily for 5 days along with Tessalon  Perles and Promethazine  DM cough syrup for cough and congestion.   Final Clinical Impressions(s) / UC Diagnoses   Final diagnoses:  Subacute cough  Lower respiratory tract infection     Discharge Instructions      Your chest x-ray did not show any evidence of pneumonia.  However, given you have been experiencing the symptoms for 3 weeks I do feel some antibiotics are warranted.  Take the azithromycin  once daily for 5 days for treatment of your lower respiratory tract infection.  Use the Tessalon  Perles every 8 hours during the day as needed for cough.  Take them with a small sip of water.  They may give you numbness to the base of your tongue, or a metallic taste in your mouth, this is normal.  Use the Promethazine  DM cough syrup at bedtime as needed for cough and congestion.  If you develop any new or worsening symptoms other return for reevaluation or follow-up with your primary care provider     ED Prescriptions     Medication Sig Dispense Auth. Provider   azithromycin  (ZITHROMAX  Z-PAK) 250 MG tablet Take 1 tablet (250 mg total) by mouth daily. Take 2 tablets on the first day and then 1 tablet daily thereafter for a total of 5 days of treatment. 6 tablet Bernardino Ditch, NP   benzonatate  (TESSALON ) 100 MG capsule Take 2 capsules (200 mg total) by mouth every 8 (eight) hours. 21 capsule Bernardino Ditch, NP   promethazine -dextromethorphan  (PROMETHAZINE -DM) 6.25-15 MG/5ML syrup Take 5 mLs by mouth 4 (four) times daily as needed. 118 mL Bernardino Ditch, NP      PDMP not reviewed this encounter.   Bernardino Ditch, NP 08/15/24 ARLETHA    Bernardino Ditch, NP 08/15/24 [phone]

## 2024-08-15 NOTE — Discharge Instructions (Addendum)
 Your chest x-ray did not show any evidence of pneumonia.  However, given you have been experiencing the symptoms for 3 weeks I do feel some antibiotics are warranted.  Take the azithromycin  once daily for 5 days for treatment of your lower respiratory tract infection.  Use the Tessalon  Perles every 8 hours during the day as needed for cough.  Take them with a small sip of water.  They may give you numbness to the base of your tongue, or a metallic taste in your mouth, this is normal.  Use the Promethazine  DM cough syrup at bedtime as needed for cough and congestion.  If you develop any new or worsening symptoms other return for reevaluation or follow-up with your primary care provider

## 2024-08-16 ENCOUNTER — Encounter: Payer: Self-pay | Admitting: Family Medicine

## 2024-08-16 ENCOUNTER — Other Ambulatory Visit: Payer: Self-pay | Admitting: Orthopaedic Surgery

## 2024-08-16 ENCOUNTER — Telehealth: Payer: Self-pay | Admitting: Orthopaedic Surgery

## 2024-08-16 ENCOUNTER — Ambulatory Visit (HOSPITAL_COMMUNITY): Payer: Self-pay

## 2024-08-16 ENCOUNTER — Telehealth: Admitting: Family Medicine

## 2024-08-16 DIAGNOSIS — E162 Hypoglycemia, unspecified: Secondary | ICD-10-CM | POA: Diagnosis not present

## 2024-08-16 DIAGNOSIS — Z113 Encounter for screening for infections with a predominantly sexual mode of transmission: Secondary | ICD-10-CM

## 2024-08-16 DIAGNOSIS — R5382 Chronic fatigue, unspecified: Secondary | ICD-10-CM

## 2024-08-16 DIAGNOSIS — Z8639 Personal history of other endocrine, nutritional and metabolic disease: Secondary | ICD-10-CM | POA: Diagnosis not present

## 2024-08-16 MED ORDER — DIAZEPAM 5 MG PO TABS: 5.0000 mg | ORAL_TABLET | Freq: Once | ORAL | 0 refills | Status: DC

## 2024-08-16 MED ORDER — FREESTYLE LIBRE 3 PLUS SENSOR MISC: 6 refills | Status: AC

## 2024-08-16 MED ORDER — FREESTYLE LIBRE 3 READER DEVI
0 refills | Status: AC
Start: 2024-08-16 — End: ?

## 2024-08-16 NOTE — Telephone Encounter (Signed)
 Pt called saying that she has an MRI tomorrow and would like something to relaxe her if possible. Pharmacy is [facility] [address] in [place]. Call back number is [phone]

## 2024-08-16 NOTE — Progress Notes (Signed)
 LMP 08/06/2024 (Exact Date)    Subjective:    Patient ID: Belinda [name], female    DOB: [birth year], 48 y.o.   MRN: [id number]  HPI: Belinda [name] is a 48 y.o. female  Chief Complaint  Patient presents with   Fatigue   FATIGUE- has been getting better. Has been getting hypoglycemia really often, she notes that it is happening 2-3x a week. She can tell that her body is changing and she's not feeling like herself. She feels like she really needs to push herself to make herself do it. She notes that her cycles seem to be shorter and her periods have been shorter. She has been eating more regularly- stopped intermittent fasting. Due to her hypoglycemia Duration:  months Severity: mild  Onset: sudden Context when symptoms started:  unknown Symptoms improve with rest: no  Depressive symptoms: no Stress/anxiety: no Insomnia: no  Snoring: no Observed apnea by bed partner: no Daytime hypersomnolence:yes Wakes feeling refreshed: yes History of sleep study: no Dysnea on exertion:  no Orthopnea/PND: no Chest pain: no Chronic cough: no Lower extremity edema: no Arthralgias:no Myalgias: no Weakness: yes Rash: no  Relevant past medical, surgical, family and social history reviewed and updated as indicated. Interim medical history since our last visit reviewed. Allergies and medications reviewed and updated.  Review of Systems  Constitutional:  Positive for fatigue. Negative for activity change, appetite change, chills, diaphoresis, fever and unexpected weight change.  Respiratory: Negative.    Cardiovascular: Negative.   Musculoskeletal: Negative.   Neurological:  Positive for weakness. Negative for dizziness, tremors, seizures, syncope, facial asymmetry, speech difficulty, light-headedness, numbness and headaches.  Psychiatric/Behavioral: Negative.      Per HPI unless specifically indicated above     Objective:    LMP 08/06/2024 (Exact Date)   Wt Readings from Last 3  Encounters:  08/15/24 163 lb 1.6 oz (74 kg)  06/30/24 154 lb (69.9 kg)  12/21/23 146 lb 6.4 oz (66.4 kg)    Physical Exam Vitals and nursing note reviewed.  Constitutional:      General: She is not in acute distress.    Appearance: Normal appearance. She is not ill-appearing, toxic-appearing or diaphoretic.  HENT:     Head: Normocephalic and atraumatic.     Right Ear: External ear normal.     Left Ear: External ear normal.     Nose: Nose normal.     Mouth/Throat:     Mouth: Mucous membranes are moist.     Pharynx: Oropharynx is clear.  Eyes:     General: No scleral icterus.       Right eye: No discharge.        Left eye: No discharge.     Conjunctiva/sclera: Conjunctivae normal.     Pupils: Pupils are equal, round, and reactive to light.  Pulmonary:     Effort: Pulmonary effort is normal. No respiratory distress.     Comments: Speaking in full sentences Musculoskeletal:        General: Normal range of motion.     Cervical back: Normal range of motion.  Skin:    Coloration: Skin is not jaundiced or pale.     Findings: No bruising, erythema, lesion or rash.  Neurological:     Mental Status: She is alert and oriented to person, place, and time. Mental status is at baseline.  Psychiatric:        Mood and Affect: Mood normal.        Behavior: Behavior normal.  Thought Content: Thought content normal.        Judgment: Judgment normal.     Results for orders placed or performed in visit on 06/30/24  VITAMIN D  25 Hydroxy (Vit-D Deficiency, Fractures)   Collection Time: 06/30/24  8:50 AM  Result Value Ref Range   Vit D, 25-Hydroxy 89.9 30.0 - 100.0 ng/mL  Comprehensive metabolic panel with GFR   Collection Time: 06/30/24  8:50 AM  Result Value Ref Range   Glucose 91 70 - 99 mg/dL   BUN 9 6 - 24 mg/dL   Creatinine, Ser 9.18 0.57 - 1.00 mg/dL   eGFR 89 >40 fO/fpw/8.26   BUN/Creatinine Ratio 11 9 - 23   Sodium 138 134 - 144 mmol/L   Potassium 4.3 3.5 - 5.2 mmol/L    Chloride 103 96 - 106 mmol/L   CO2 22 20 - 29 mmol/L   Calcium 9.2 8.7 - 10.2 mg/dL   Total Protein 6.5 6.0 - 8.5 g/dL   Albumin 4.5 3.9 - 4.9 g/dL   Globulin, Total 2.0 1.5 - 4.5 g/dL   Bilirubin Total 0.3 0.0 - 1.2 mg/dL   Alkaline Phosphatase 58 41 - 116 IU/L   AST 17 0 - 40 IU/L   ALT 13 0 - 32 IU/L  CBC with Differential/Platelet   Collection Time: 06/30/24  8:50 AM  Result Value Ref Range   WBC 6.5 3.4 - 10.8 x10E3/uL   RBC 4.34 3.77 - 5.28 x10E6/uL   Hemoglobin 13.6 11.1 - 15.9 g/dL   Hematocrit 56.6 65.9 - 46.6 %   MCV 100 (H) 79 - 97 fL   MCH 31.3 26.6 - 33.0 pg   MCHC 31.4 (L) 31.5 - 35.7 g/dL   RDW 87.4 88.2 - 84.5 %   Platelets 242 150 - 450 x10E3/uL   Neutrophils 61 Not Estab. %   Lymphs 29 Not Estab. %   Monocytes 7 Not Estab. %   Eos 2 Not Estab. %   Basos 1 Not Estab. %   Neutrophils Absolute 4.0 1.4 - 7.0 x10E3/uL   Lymphocytes Absolute 1.9 0.7 - 3.1 x10E3/uL   Monocytes Absolute 0.4 0.1 - 0.9 x10E3/uL   EOS (ABSOLUTE) 0.2 0.0 - 0.4 x10E3/uL   Basophils Absolute 0.1 0.0 - 0.2 x10E3/uL   Immature Granulocytes 0 Not Estab. %   Immature Grans (Abs) 0.0 0.0 - 0.1 x10E3/uL  TSH   Collection Time: 06/30/24  8:50 AM  Result Value Ref Range   TSH 1.660 0.450 - 4.500 uIU/mL  B12   Collection Time: 06/30/24  8:50 AM  Result Value Ref Range   Vitamin B-12 634 232 - 1,245 pg/mL  Ferritin   Collection Time: 06/30/24  8:50 AM  Result Value Ref Range   Ferritin 19 15 - 150 ng/mL  Iron  Binding Cap (TIBC)([facility])   Collection Time: 06/30/24  8:50 AM  Result Value Ref Range   Total Iron  Binding Capacity 431 250 - 450 ug/dL   UIBC 641 868 - 574 ug/dL   Iron  73 27 - 159 ug/dL   Iron  Saturation 17 15 - 55 %  Hepatitis B surface antibody,quantitative   Collection Time: 06/30/24  8:50 AM  Result Value Ref Range   Hepatitis B Surf Ab Quant 32.1 Immunity>10 mIU/mL  Lyme Disease Serology w/Reflex   Collection Time: 06/30/24  8:50 AM  Result Value Ref Range    Lyme Total Antibody EIA Negative Negative  Spotted Fever Group Antibodies   Collection Time: 06/30/24  8:50 AM  Result Value Ref Range   Spotted Fever Group IgG <1:64 Neg:<1:64   Spotted Fever Group IgM <1:64 Neg:<1:64   Result Comment Comment       Assessment & Plan:   Problem List Items Addressed This Visit   None Visit Diagnoses       Hypoglycemia    -  Primary   Will get her set up with CGM. Call with any concerns. Recheck in about 6 weeks.   Relevant Medications   Continuous Glucose Receiver (FREESTYLE LIBRE 3 READER) DEVI   Continuous Glucose Sensor (FREESTYLE LIBRE 3 PLUS SENSOR) MISC     Chronic fatigue       Will see if keeping sugar in the normal range will help with fatigue- has improved. Continue to monitor closely. Call with any concerns.     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 Will recheck labs. Await results.   Relevant Orders   Microalbumin, Urine Waived   Bayer DCA Hb A1c Waived     Screening examination for STI       Will come in for labs tomorrow. Await results.   Relevant Orders   HIV Antibody (routine testing w rflx)   HSV 1 and 2 Ab, IgG   GC/Chlamydia Probe Amp   RPR w/reflex to TrepSure   Acute Viral Hepatitis (HAV, HBV, HCV)        Follow up plan: Return in about 6 weeks (around 09/27/2024) for virtual OK.   This visit was completed via video visit through MyChart due to the restrictions of the COVID-19 pandemic. All issues as above were discussed and addressed. Physical exam was done as above through visual confirmation on video through MyChart. If it was felt that the patient should be evaluated in the office, they were directed there. The patient verbally consented to this visit. Location of the patient: home Location of the provider: work Those involved with this call:  Provider: Duwaine Louder, DO CMA: Chanthearin Sambath, CMA Front Desk/Registration: Claretta Maiden  Time spent on call: 15 minutes with  patient face to face via video conference. More than 50% of this time was spent in counseling and coordination of care. 23 minutes total spent in review of patient's record and preparation of their chart.

## 2024-08-16 NOTE — Progress Notes (Signed)
 Scheduled

## 2024-08-17 ENCOUNTER — Ambulatory Visit (HOSPITAL_COMMUNITY): Admission: RE | Admit: 2024-08-17 | Source: Ambulatory Visit

## 2024-08-17 ENCOUNTER — Other Ambulatory Visit

## 2024-08-17 ENCOUNTER — Telehealth: Payer: Self-pay | Admitting: *Deleted

## 2024-08-17 NOTE — Telephone Encounter (Addendum)
 Pt insurance denied the MRI Left hip. I called pt to see if she has had any conservative treatment like 6 weeks Physical therapy or Home exercises. Pt states she had Physical therapy a couple months ago which,began 06/02/24 she has done 3 times a week for 12 weeks, which, ended on 08/15/24,  pt has had 8 visits for physical therapy.

## 2024-08-19 ENCOUNTER — Other Ambulatory Visit

## 2024-08-19 ENCOUNTER — Encounter: Payer: Self-pay | Admitting: Family Medicine

## 2024-08-19 DIAGNOSIS — Z1231 Encounter for screening mammogram for malignant neoplasm of breast: Secondary | ICD-10-CM

## 2024-08-19 DIAGNOSIS — Z8639 Personal history of other endocrine, nutritional and metabolic disease: Secondary | ICD-10-CM

## 2024-08-19 DIAGNOSIS — Z113 Encounter for screening for infections with a predominantly sexual mode of transmission: Secondary | ICD-10-CM

## 2024-08-19 LAB — MICROALBUMIN, URINE WAIVED
Creatinine, Urine Waived: 50 mg/dL (ref 10–300)
Microalb, Ur Waived: 10 mg/L (ref 0–19)

## 2024-08-19 LAB — BAYER DCA HB A1C WAIVED: HB A1C (BAYER DCA - WAIVED): 5.2 % (ref 4.8–5.6)

## 2024-08-20 LAB — RPR W/REFLEX TO TREPSURE

## 2024-08-21 LAB — GC/CHLAMYDIA PROBE AMP
Chlamydia trachomatis, NAA: NEGATIVE
Neisseria Gonorrhoeae by PCR: NEGATIVE

## 2024-08-23 LAB — TREPONEMAL ANTIBODIES, TPPA: Treponemal Antibodies, TPPA: NONREACTIVE

## 2024-08-23 LAB — ACUTE VIRAL HEPATITIS (HAV, HBV, HCV)
HCV Ab: NONREACTIVE
Hep A IgM: NEGATIVE
Hep B C IgM: NEGATIVE
Hepatitis B Surface Ag: NEGATIVE

## 2024-08-23 LAB — HSV 1 AND 2 AB, IGG
HSV 1 Glycoprotein G Ab, IgG: NONREACTIVE
HSV 2 IgG, Type Spec: NONREACTIVE

## 2024-08-23 LAB — HCV INTERPRETATION

## 2024-08-23 LAB — HIV ANTIBODY (ROUTINE TESTING W REFLEX): HIV Screen 4th Generation wRfx: NONREACTIVE

## 2024-08-23 LAB — RPR W/REFLEX TO TREPSURE: RPR: NONREACTIVE

## 2024-08-26 ENCOUNTER — Encounter: Payer: Self-pay | Admitting: *Deleted

## 2024-08-26 ENCOUNTER — Telehealth: Admitting: Family Medicine

## 2024-08-26 ENCOUNTER — Encounter: Payer: Self-pay | Admitting: Family Medicine

## 2024-08-26 VITALS — Ht 62.0 in | Wt 152.0 lb

## 2024-08-26 DIAGNOSIS — E162 Hypoglycemia, unspecified: Secondary | ICD-10-CM | POA: Diagnosis not present

## 2024-08-26 DIAGNOSIS — M25552 Pain in left hip: Secondary | ICD-10-CM

## 2024-08-26 NOTE — Progress Notes (Signed)
 Ht 5' 2 (1.575 m)   Wt 152 lb (68.9 kg)   LMP 08/06/2024 (Exact Date)   BMI 27.80 kg/m    Subjective:    Patient ID: Belinda [name], female    DOB: 10/16/1975, 48 y.o.   MRN: [id number]  HPI: Belinda [name] is a 48 y.o. female  Chief Complaint  Patient presents with   Hypoglycemia    BGL dropping 3 or 4 times a night. Running in the 40s at night. Running between 70s-90s during the day. 140s after eating.    Sugars are dropping primarily when she's sleeping. She notes that her sugars have been going off more during the day. She notes that she has adjusted her diet and that seems to be helping. Using the CGM does seem to help it from getting significantly lowe. She  has been eating dinner about 5-6PM, having a snack right before bed- the more protein she is eating the longer she can go. She does note that she will wake up and feel bad when her sugars drop between 40-42, but her alarm is waking her up before that.   HIP PAIN- chronic. Has been going on since 2017. Has done home exercise program at home. Following with ortho- but MRI was denied.  Duration: chronic Involved hip: left  Mechanism of injury: unknown Location: lateral aspect Onset: gradual  Severity: moderate  Quality: aching and sore Frequency: intermittent Radiation: no Aggravating factors: laying on it   Alleviating factors: nothing  Status: worse Treatments attempted: rest, ice, heat, APAP, and HEP   Weakness with weight bearing: no Weakness with walking: no Paresthesias / decreased sensation: no Swelling: no Redness:no Fevers: no   Relevant past medical, surgical, family and social history reviewed and updated as indicated. Interim medical history since our last visit reviewed. Allergies and medications reviewed and updated.  Review of Systems  Constitutional:  Positive for fatigue. Negative for activity change, appetite change, chills, diaphoresis, fever and unexpected weight change.  Respiratory:  Negative.    Cardiovascular: Negative.   Musculoskeletal: Negative.   Skin: Negative.   Neurological:  Positive for dizziness and light-headedness. Negative for tremors, seizures, syncope, facial asymmetry, speech difficulty, weakness, numbness and headaches.  Psychiatric/Behavioral: Negative.      Per HPI unless specifically indicated above     Objective:    Ht 5' 2 (1.575 m)   Wt 152 lb (68.9 kg)   LMP 08/06/2024 (Exact Date)   BMI 27.80 kg/m   Wt Readings from Last 3 Encounters:  08/26/24 152 lb (68.9 kg)  08/15/24 163 lb 1.6 oz (74 kg)  06/30/24 154 lb (69.9 kg)    Physical Exam Vitals and nursing note reviewed.  Constitutional:      General: She is not in acute distress.    Appearance: Normal appearance. She is not ill-appearing, toxic-appearing or diaphoretic.  HENT:     Head: Normocephalic and atraumatic.     Right Ear: External ear normal.     Left Ear: External ear normal.     Nose: Nose normal.     Mouth/Throat:     Mouth: Mucous membranes are moist.     Pharynx: Oropharynx is clear.  Eyes:     General: No scleral icterus.       Right eye: No discharge.        Left eye: No discharge.     Conjunctiva/sclera: Conjunctivae normal.     Pupils: Pupils are equal, round, and reactive to light.  Pulmonary:  Effort: Pulmonary effort is normal. No respiratory distress.     Comments: Speaking in full sentences Musculoskeletal:        General: Normal range of motion.     Cervical back: Normal range of motion.  Skin:    Coloration: Skin is not jaundiced or pale.     Findings: No bruising, erythema, lesion or rash.  Neurological:     Mental Status: She is alert and oriented to person, place, and time. Mental status is at baseline.  Psychiatric:        Mood and Affect: Mood normal.        Behavior: Behavior normal.        Thought Content: Thought content normal.        Judgment: Judgment normal.     Results for orders placed or performed in visit on  2024/08/24  GC/Chlamydia Probe Amp   Collection Time: 2024-08-24  9:21 AM   Specimen: Urine   UR  Result Value Ref Range   Chlamydia trachomatis, NAA Negative Negative   Neisseria Gonorrhoeae by PCR Negative Negative  Microalbumin, Urine Waived   Collection Time: August 24, 2024  9:21 AM  Result Value Ref Range   Microalb, Ur Waived 10 0 - 19 mg/L   Creatinine, Urine Waived 50 10 - 300 mg/dL   Microalb/Creat Ratio 30-300 (H) <30 mg/g  Bayer DCA Hb A1c Waived   Collection Time: 08-24-2024  9:21 AM  Result Value Ref Range   HB A1C (BAYER DCA - WAIVED) 5.2 4.8 - 5.6 %  Treponemal Antibodies, TPPA   Collection Time: 08-24-2024  9:26 AM  Result Value Ref Range   Treponemal Antibodies, TPPA Non Reactive Non Reactive   Interpretation: Comment   HIV Antibody (routine testing w rflx)   Collection Time: August 24, 2024  9:26 AM  Result Value Ref Range   HIV Screen 4th Generation wRfx Non Reactive Non Reactive  HSV 1 and 2 Ab, IgG   Collection Time: Aug 24, 2024  9:26 AM  Result Value Ref Range   HSV 1 Glycoprotein G Ab, IgG Non Reactive Non Reactive   HSV 2 IgG, Type Spec Non Reactive Non Reactive  RPR w/reflex to TrepSure   Collection Time: 08-24-24  9:26 AM  Result Value Ref Range   RPR Non Reactive Non Reactive  Acute Viral Hepatitis (HAV, HBV, HCV)   Collection Time: 2024-08-24  9:26 AM  Result Value Ref Range   Hep A IgM Negative Negative   Hepatitis B Surface Ag Negative Negative   Hep B C IgM Negative Negative   HCV Ab Non Reactive Non Reactive  Interpretation:   Collection Time: 08/24/24  9:26 AM  Result Value Ref Range   HCV Interp 1: Comment       Assessment & Plan:   Problem List Items Addressed This Visit   None Visit Diagnoses       Hypoglycemia    -  Primary   Will continue to monitor with CGM. Offered referral to nutrition- will hold on that for now. Call with any concerns.     Left hip pain       Working with ortho. Has been doing home exercise program since pain started in 2017.  Continue to follow with ortho. Call with any concerns.        Follow up plan: Return for As scheduled.   This visit was completed via video visit through MyChart due to the restrictions of the COVID-19 pandemic. All issues as above were discussed and addressed.  Physical exam was done as above through visual confirmation on video through MyChart. If it was felt that the patient should be evaluated in the office, they were directed there. The patient verbally consented to this visit. Location of the patient: home Location of the provider: work Those involved with this call:  Provider: Duwaine Louder, DO CMA: York Fogo, CMA, Front Desk/Registration: Claretta Maiden  Time spent on call: 25 minutes with patient face to face via video conference. More than 50% of this time was spent in counseling and coordination of care. 40 minutes total spent in review of patient's record and preparation of their chart.

## 2024-08-30 ENCOUNTER — Ambulatory Visit: Admitting: Family Medicine

## 2024-08-30 ENCOUNTER — Ambulatory Visit: Payer: Self-pay | Admitting: Family Medicine

## 2024-09-07 ENCOUNTER — Ambulatory Visit: Admitting: Orthopaedic Surgery

## 2024-09-27 ENCOUNTER — Telehealth (INDEPENDENT_AMBULATORY_CARE_PROVIDER_SITE_OTHER): Admitting: Family Medicine

## 2024-09-27 DIAGNOSIS — E162 Hypoglycemia, unspecified: Secondary | ICD-10-CM

## 2024-09-27 MED ORDER — "INSULIN SYRINGE-NEEDLE U-100 30G X 1/2"" 0.5 ML MISC": 1.0000 | 0 refills | Status: AC

## 2024-09-27 MED ORDER — ONDANSETRON HCL 4 MG PO TABS: 4.0000 mg | ORAL_TABLET | Freq: Three times a day (TID) | ORAL | 0 refills | Status: AC | PRN

## 2024-09-27 NOTE — Progress Notes (Signed)
 "  There were no vitals taken for this visit.   Subjective:    Patient ID: Belinda [name], female    DOB: 1976-08-19, 48 y.o.   MRN: [id number]  HPI: Belinda [name] is a 48 y.o. female  Chief Complaint  Patient presents with   Hypoglycemia    Patient states her BS has been better, still having a few low readings. States there are a few times that it seems like she cannot get her BS to come up. States she had a spell this past Saturday where her BS had dropped several times in the middle of the night.    Has been feeling a lot better. She notes that she had 1 bad night in the past month. Still using the continuous glucose monitor. She notes that bad night she started about an hour after dinner and dropped a couple of times before bed. She was woken up by the alarm rather than the low sugar. She doesn't remember doing anything different that day which would have made the sugars go down. She's been really busy with the holidays and sleeping a bit less, but she is feeling generally better. No other concerns or complaints at this time.   Relevant past medical, surgical, family and social history reviewed and updated as indicated. Interim medical history since our last visit reviewed. Allergies and medications reviewed and updated.  Review of Systems  Constitutional:  Positive for fatigue. Negative for activity change, appetite change, chills, diaphoresis, fever and unexpected weight change.  Respiratory: Negative.    Cardiovascular: Negative.   Neurological:  Positive for dizziness, weakness and light-headedness. Negative for tremors, seizures, syncope, facial asymmetry, speech difficulty, numbness and headaches.  Psychiatric/Behavioral: Negative.      Per HPI unless specifically indicated above     Objective:    There were no vitals taken for this visit.  Wt Readings from Last 3 Encounters:  08/26/24 152 lb (68.9 kg)  08/15/24 163 lb 1.6 oz (74 kg)  06/30/24 154 lb (69.9 kg)     Physical Exam Vitals and nursing note reviewed.  Constitutional:      General: She is not in acute distress.    Appearance: Normal appearance. She is not ill-appearing, toxic-appearing or diaphoretic.  HENT:     Head: Normocephalic and atraumatic.     Right Ear: External ear normal.     Left Ear: External ear normal.     Nose: Nose normal.     Mouth/Throat:     Mouth: Mucous membranes are moist.     Pharynx: Oropharynx is clear.  Eyes:     General: No scleral icterus.       Right eye: No discharge.        Left eye: No discharge.     Conjunctiva/sclera: Conjunctivae normal.     Pupils: Pupils are equal, round, and reactive to light.  Pulmonary:     Effort: Pulmonary effort is normal. No respiratory distress.     Comments: Speaking in full sentences Musculoskeletal:        General: Normal range of motion.     Cervical back: Normal range of motion.  Skin:    Coloration: Skin is not jaundiced or pale.     Findings: No bruising, erythema, lesion or rash.  Neurological:     Mental Status: She is alert and oriented to person, place, and time. Mental status is at baseline.  Psychiatric:        Mood and Affect: Mood normal.  Behavior: Behavior normal.        Thought Content: Thought content normal.        Judgment: Judgment normal.     Results for orders placed or performed in visit on 08/19/24  GC/Chlamydia Probe Amp   Collection Time: 08/19/24  9:21 AM   Specimen: Urine   UR  Result Value Ref Range   Chlamydia trachomatis, NAA Negative Negative   Neisseria Gonorrhoeae by PCR Negative Negative  Microalbumin, Urine Waived   Collection Time: 08/19/24  9:21 AM  Result Value Ref Range   Microalb, Ur Waived 10 0 - 19 mg/L   Creatinine, Urine Waived 50 10 - 300 mg/dL   Microalb/Creat Ratio 30-300 (H) <30 mg/g  Bayer DCA Hb A1c Waived   Collection Time: 08/19/24  9:21 AM  Result Value Ref Range   HB A1C (BAYER DCA - WAIVED) 5.2 4.8 - 5.6 %  Treponemal Antibodies, TPPA    Collection Time: 08/19/24  9:26 AM  Result Value Ref Range   Treponemal Antibodies, TPPA Non Reactive Non Reactive   Interpretation: Comment   HIV Antibody (routine testing w rflx)   Collection Time: 08/19/24  9:26 AM  Result Value Ref Range   HIV Screen 4th Generation wRfx Non Reactive Non Reactive  HSV 1 and 2 Ab, IgG   Collection Time: 08/19/24  9:26 AM  Result Value Ref Range   HSV 1 Glycoprotein G Ab, IgG Non Reactive Non Reactive   HSV 2 IgG, Type Spec Non Reactive Non Reactive  RPR w/reflex to TrepSure   Collection Time: 08/19/24  9:26 AM  Result Value Ref Range   RPR Non Reactive Non Reactive  Acute Viral Hepatitis (HAV, HBV, HCV)   Collection Time: 08/19/24  9:26 AM  Result Value Ref Range   Hep A IgM Negative Negative   Hepatitis B Surface Ag Negative Negative   Hep B C IgM Negative Negative   HCV Ab Non Reactive Non Reactive  Interpretation:   Collection Time: 08/19/24  9:26 AM  Result Value Ref Range   HCV Interp 1: Comment       Assessment & Plan:   Problem List Items Addressed This Visit   None Visit Diagnoses       Hypoglycemia    -  Primary   Improving. Continue to use CGM and continue diet. Call with any concerns. Follow up at physical in March.        Follow up plan: Return in about 3 months (around 12/26/2024) for physical.   This visit was completed via video visit through MyChart due to the restrictions of the COVID-19 pandemic. All issues as above were discussed and addressed. Physical exam was done as above through visual confirmation on video through MyChart. If it was felt that the patient should be evaluated in the office, they were directed there. The patient verbally consented to this visit. Location of the patient: work Location of the provider: work Those involved with this call:  Provider: Duwaine Louder, DO CMA: Laymon Metro, CMA Front Desk/Registration: Claretta Maiden  Time spent on call: 15 minutes with patient face to face via  video conference. More than 50% of this time was spent in counseling and coordination of care. 23 minutes total spent in review of patient's record and preparation of their chart.     "

## 2024-11-01 ENCOUNTER — Telehealth: Payer: Self-pay

## 2024-11-01 DIAGNOSIS — Z9884 Bariatric surgery status: Secondary | ICD-10-CM

## 2024-11-01 MED ORDER — FREESTYLE LIBRE 3 PLUS SENSOR MISC: Status: AC

## 2024-11-01 NOTE — Telephone Encounter (Signed)
 If we have a sample she can have one

## 2024-11-01 NOTE — Telephone Encounter (Signed)
 Patient aware one Herlene 3 is ready for pickup from the front desk.

## 2024-11-01 NOTE — Telephone Encounter (Signed)
 Copied from CRM #[id number]. Topic: Clinical - Prescription Issue >> Oct 31, 2024  2:32 PM Joesph B wrote: Reason for CRM: Glucose monitor. Pharmacy out of stock and wants to know if her pcp has one she cann pick up, please advise.

## 2024-11-01 NOTE — Telephone Encounter (Signed)
 Called to schedule physical. Patient will schedule physical today when she comes in to pick up meds.

## 2024-12-22 ENCOUNTER — Encounter: Admitting: Family Medicine

## 2024-12-30 ENCOUNTER — Encounter: Admitting: Family Medicine
